# Patient Record
Sex: Female | Born: 1935 | Race: White | Hispanic: No | Smoking: Former smoker
Health system: Southern US, Community
[De-identification: ages and names within clinical notes are randomized; demographics above are authoritative.]

## PROBLEM LIST (undated history)

## (undated) DIAGNOSIS — E119 Type 2 diabetes mellitus without complications: Secondary | ICD-10-CM

## (undated) DIAGNOSIS — R112 Nausea with vomiting, unspecified: Secondary | ICD-10-CM

## (undated) DIAGNOSIS — F419 Anxiety disorder, unspecified: Secondary | ICD-10-CM

## (undated) DIAGNOSIS — I10 Essential (primary) hypertension: Secondary | ICD-10-CM

## (undated) DIAGNOSIS — E785 Hyperlipidemia, unspecified: Secondary | ICD-10-CM

## (undated) DIAGNOSIS — G4730 Sleep apnea, unspecified: Secondary | ICD-10-CM

## (undated) DIAGNOSIS — K219 Gastro-esophageal reflux disease without esophagitis: Secondary | ICD-10-CM

## (undated) DIAGNOSIS — E0590 Thyrotoxicosis, unspecified without thyrotoxic crisis or storm: Secondary | ICD-10-CM

## (undated) DIAGNOSIS — M1990 Unspecified osteoarthritis, unspecified site: Secondary | ICD-10-CM

## (undated) DIAGNOSIS — S43006A Unspecified dislocation of unspecified shoulder joint, initial encounter: Secondary | ICD-10-CM

## (undated) DIAGNOSIS — Z9889 Other specified postprocedural states: Secondary | ICD-10-CM

## (undated) HISTORY — PX: EYE SURGERY: SHX253

## (undated) HISTORY — PX: BREAST SURGERY: SHX581

## (undated) HISTORY — PX: TONSILLECTOMY: SUR1361

## (undated) HISTORY — PX: LIPOMA EXCISION: SHX5283

---

## 1997-09-30 ENCOUNTER — Other Ambulatory Visit: Admission: RE | Admit: 1997-09-30 | Discharge: 1997-09-30 | Payer: Self-pay | Admitting: Internal Medicine

## 1998-10-05 ENCOUNTER — Other Ambulatory Visit: Admission: RE | Admit: 1998-10-05 | Discharge: 1998-10-05 | Payer: Self-pay | Admitting: Internal Medicine

## 1999-05-17 ENCOUNTER — Encounter: Payer: Self-pay | Admitting: Orthopedic Surgery

## 1999-05-17 ENCOUNTER — Encounter: Admission: RE | Admit: 1999-05-17 | Discharge: 1999-05-17 | Payer: Self-pay | Admitting: Orthopedic Surgery

## 1999-10-29 ENCOUNTER — Ambulatory Visit (HOSPITAL_COMMUNITY): Admission: RE | Admit: 1999-10-29 | Discharge: 1999-10-29 | Payer: Self-pay | Admitting: Gastroenterology

## 1999-10-29 ENCOUNTER — Encounter (INDEPENDENT_AMBULATORY_CARE_PROVIDER_SITE_OTHER): Payer: Self-pay | Admitting: Specialist

## 1999-11-08 ENCOUNTER — Other Ambulatory Visit: Admission: RE | Admit: 1999-11-08 | Discharge: 1999-11-08 | Payer: Self-pay | Admitting: Internal Medicine

## 2000-04-17 ENCOUNTER — Encounter: Payer: Self-pay | Admitting: Orthopedic Surgery

## 2000-04-17 ENCOUNTER — Encounter: Admission: RE | Admit: 2000-04-17 | Discharge: 2000-04-17 | Payer: Self-pay | Admitting: Orthopedic Surgery

## 2000-11-13 ENCOUNTER — Other Ambulatory Visit: Admission: RE | Admit: 2000-11-13 | Discharge: 2000-11-13 | Payer: Self-pay | Admitting: Internal Medicine

## 2001-11-15 ENCOUNTER — Other Ambulatory Visit: Admission: RE | Admit: 2001-11-15 | Discharge: 2001-11-15 | Payer: Self-pay | Admitting: Internal Medicine

## 2003-08-28 ENCOUNTER — Encounter: Admission: RE | Admit: 2003-08-28 | Discharge: 2003-08-28 | Payer: Self-pay | Admitting: Orthopedic Surgery

## 2003-10-13 ENCOUNTER — Ambulatory Visit (HOSPITAL_COMMUNITY): Admission: RE | Admit: 2003-10-13 | Discharge: 2003-10-13 | Payer: Self-pay | Admitting: Gastroenterology

## 2003-10-13 ENCOUNTER — Encounter (INDEPENDENT_AMBULATORY_CARE_PROVIDER_SITE_OTHER): Payer: Self-pay | Admitting: Specialist

## 2006-09-24 ENCOUNTER — Emergency Department (HOSPITAL_COMMUNITY): Admission: EM | Admit: 2006-09-24 | Discharge: 2006-09-25 | Payer: Self-pay | Admitting: Emergency Medicine

## 2007-05-24 ENCOUNTER — Encounter: Admission: RE | Admit: 2007-05-24 | Discharge: 2007-05-24 | Payer: Self-pay | Admitting: Internal Medicine

## 2010-09-17 NOTE — Op Note (Signed)
NAME:  Cristina Middleton, Cristina Middleton                          ACCOUNT NO.:  [id number]   MEDICAL RECORD NO.:  [id number]                   PATIENT TYPE:  AMB   LOCATION:  ENDO                                 FACILITY:  [facility]   PHYSICIAN:  Petra Kuba, M.D.                 DATE OF BIRTH:  Sep 19, 1920   DATE OF PROCEDURE:  10/13/2003  DATE OF DISCHARGE:                                 OPERATIVE REPORT   PROCEDURE PERFORMED:  Colonoscopy.   ENDOSCOPIST:  Petra Kuba, M.D.   INDICATIONS FOR PROCEDURE:  Patient with history of colon polyps.  Due for  repeat screening.  Consent was signed after the risks, benefits, methods and  options were thoroughly discussed in the office on multiple occasions.   MEDICINES USED:  Demerol 40 mg, Versed 5 mg.   DESCRIPTION OF PROCEDURE:  Rectal inspection was pertinent for external  hemorrhoids, small.  Digital exam was negative.  A video pediatric  adjustable colonoscope was inserted and despite lots of diverticula, easily  able to be advanced around the colon to the cecum.  Other than left greater  than right diverticula, no abnormalities were seen.  The cecum was  identified by the appendiceal orifice and the ileocecal valve.  The scope  was slowly withdrawn. Moderate amount of washing and suctioning was done for  adequate visualization.  On slow withdrawal through the colon, three tiny  polyps were seen which were all cold biopsied times one or two on the  ileocecal valve, midtransverse and in the proximal rectum.  All were put in  the same container.  Possibly all were hyperplastic appearing.  The scope  was then slowly withdrawn.  Other than the diverticula, left greater than  right, no additional findings were seen.  Anorectal pullthrough and  retroflexion confirmed some small hemorrhoids.  The scope was reinserted a  short ways up the left side of the colon.  Air was suctioned, scope removed.  The patient tolerated the procedure well.  There was no  immediate obvious  complication.   ENDOSCOPIC DIAGNOSIS:  1. Internal and external hemorrhoids.  2. Left greater than right diverticula.  3. Three tiny polyps in the proximal rectum, mid transverse and ileocecal     valve, all cold biopsied.  4. Otherwise within normal limits to the cecum.   PLAN:  Await pathology but probably repeat colon screening if doing  exceptionally well medically in five years.  Happy to see back p.r.n.  Otherwise return care to Dr.  Lendell Caprice for the customary health care  maintenance.                                               Petra Kuba, M.D.    MEM/MEDQ  D:  10/13/2003  T:  10/13/2003  Job:  [id number]   cc:   Janae Bridgeman. Eloise Harman., M.D.  [address]  [place]  [place] [address]  Fax: [phone]

## 2010-09-17 NOTE — Op Note (Signed)
[facility]  Patient:    Cristina Middleton, Cristina Middleton                       MRN: [id number] Adm. Date:  [id number] Attending:  Nelda Marseille CC:         Petra Kuba, M.D.             Janae Bridgeman. Eloise Harman., M.D.                           Operative Report  PROCEDURE:  Colonoscopy with polypectomy.  INDICATION:  Patient with a history of colon polyps, due for repeat screening.  Consent was signed after risks, benefits, methods and options were thoroughly discussed multiple times in the past.  Medicines used were Demerol 40, Versed 4.  PROCEDURE:  Rectal inspection is pertinent for external hemorrhoids.  Digital exam was negative.  The video colonoscope was inserted and with mild difficulty due to diverticula-filled sigmoid, we were able to advance to the colon.  This did require rolling her on her back and some abdominal pressure. The cecum was identified by the appendiceal orifice and the ileocecal valve. On insertion noted on the left side a diverticula.  No other abnormalities were seen.  The scope was slowly withdrawn.  The prep was fairly adequate. Due to the significant diverticula there was lots of stool that needed washing and suctioning and some stool that was formed that needed to be washed in the different positions.  There was an occasional right-sided diverticula.  In the proximal ascending a 2-mm polyp was seen and was cold biopsied times one.  The scope was further withdrawn and no polyps were seen as we slowly withdrew back to the distal sigmoid where two small polyps were seen and were each hot biopsied times two.  Once back in the rectum the scope was retroflexed, pertinent for some internal hemorrhoids and a small polyp on retroflexion which was hot biopsied times one.  All polyps were put in the same container.  The scope was straightened and straight visualization in the rectum showed a well-healed scar from a previous moderately-large  rectal polyp and we went ahead and took some cold biopsies of that just to be sure there were no signs of residual polyp.  That was put in a second container.  The scope was inserted a short ways up the sigmoid area, suctioned and the scope removed.  The patient tolerated the procedure well.  There were no obvious immediate complications.  ENDOSCOPIC DIAGNOSES: 1.  Internal / external hemorrhoids. 2.  Three rectal sigmoid tiny to small polyps status post hot biopsy. 3.  Severe left greater than occasional right diverticula. 4.  Ascending tiny polyp cold biopsy. 5.  Rectal scar from previous cautery status post biopsy put in a second     container. 6.  Otherwise within normal limits to the cecum with a fairly adequate prep     requiring lots of washing and suctioning.  PLAN:  Await pathology to determine future colonic screening _____ polypectomy instructions, yearly rectals and guaiacs per Dr. Lendell Caprice.  Have to see her back sooner p.r.n.  Otherwise probable repeat colonoscopy in five years if medically stable. DD:  10/29/99 TD:  10/30/99 Job: [id number]

## 2011-06-06 DIAGNOSIS — J019 Acute sinusitis, unspecified: Secondary | ICD-10-CM | POA: Diagnosis not present

## 2011-06-13 DIAGNOSIS — R112 Nausea with vomiting, unspecified: Secondary | ICD-10-CM | POA: Diagnosis not present

## 2011-06-27 DIAGNOSIS — M25859 Other specified joint disorders, unspecified hip: Secondary | ICD-10-CM | POA: Diagnosis not present

## 2011-06-27 DIAGNOSIS — N949 Unspecified condition associated with female genital organs and menstrual cycle: Secondary | ICD-10-CM | POA: Diagnosis not present

## 2011-06-27 DIAGNOSIS — M47817 Spondylosis without myelopathy or radiculopathy, lumbosacral region: Secondary | ICD-10-CM | POA: Diagnosis not present

## 2011-06-27 DIAGNOSIS — M719 Bursopathy, unspecified: Secondary | ICD-10-CM | POA: Diagnosis not present

## 2011-06-27 DIAGNOSIS — M25559 Pain in unspecified hip: Secondary | ICD-10-CM | POA: Diagnosis not present

## 2011-06-27 DIAGNOSIS — M159 Polyosteoarthritis, unspecified: Secondary | ICD-10-CM | POA: Diagnosis not present

## 2011-06-27 DIAGNOSIS — M25569 Pain in unspecified knee: Secondary | ICD-10-CM | POA: Diagnosis not present

## 2011-06-27 DIAGNOSIS — M4120 Other idiopathic scoliosis, site unspecified: Secondary | ICD-10-CM | POA: Diagnosis not present

## 2011-06-27 DIAGNOSIS — M169 Osteoarthritis of hip, unspecified: Secondary | ICD-10-CM | POA: Diagnosis not present

## 2011-06-30 DIAGNOSIS — M79609 Pain in unspecified limb: Secondary | ICD-10-CM | POA: Diagnosis not present

## 2011-07-05 DIAGNOSIS — M79609 Pain in unspecified limb: Secondary | ICD-10-CM | POA: Diagnosis not present

## 2011-07-07 DIAGNOSIS — M79609 Pain in unspecified limb: Secondary | ICD-10-CM | POA: Diagnosis not present

## 2011-07-12 DIAGNOSIS — M79609 Pain in unspecified limb: Secondary | ICD-10-CM | POA: Diagnosis not present

## 2011-07-14 DIAGNOSIS — M79609 Pain in unspecified limb: Secondary | ICD-10-CM | POA: Diagnosis not present

## 2011-07-14 DIAGNOSIS — E119 Type 2 diabetes mellitus without complications: Secondary | ICD-10-CM | POA: Diagnosis not present

## 2011-07-14 DIAGNOSIS — E7800 Pure hypercholesterolemia, unspecified: Secondary | ICD-10-CM | POA: Diagnosis not present

## 2011-07-19 DIAGNOSIS — M79609 Pain in unspecified limb: Secondary | ICD-10-CM | POA: Diagnosis not present

## 2011-07-21 DIAGNOSIS — M79609 Pain in unspecified limb: Secondary | ICD-10-CM | POA: Diagnosis not present

## 2011-07-25 DIAGNOSIS — E119 Type 2 diabetes mellitus without complications: Secondary | ICD-10-CM | POA: Diagnosis not present

## 2011-07-25 DIAGNOSIS — I10 Essential (primary) hypertension: Secondary | ICD-10-CM | POA: Diagnosis not present

## 2011-07-25 DIAGNOSIS — E7800 Pure hypercholesterolemia, unspecified: Secondary | ICD-10-CM | POA: Diagnosis not present

## 2011-07-26 DIAGNOSIS — M79609 Pain in unspecified limb: Secondary | ICD-10-CM | POA: Diagnosis not present

## 2011-07-28 DIAGNOSIS — M79609 Pain in unspecified limb: Secondary | ICD-10-CM | POA: Diagnosis not present

## 2011-08-10 DIAGNOSIS — Z1231 Encounter for screening mammogram for malignant neoplasm of breast: Secondary | ICD-10-CM | POA: Diagnosis not present

## 2011-08-17 DIAGNOSIS — E119 Type 2 diabetes mellitus without complications: Secondary | ICD-10-CM | POA: Diagnosis not present

## 2011-08-17 DIAGNOSIS — E7800 Pure hypercholesterolemia, unspecified: Secondary | ICD-10-CM | POA: Diagnosis not present

## 2011-08-17 DIAGNOSIS — E039 Hypothyroidism, unspecified: Secondary | ICD-10-CM | POA: Diagnosis not present

## 2011-08-17 DIAGNOSIS — I10 Essential (primary) hypertension: Secondary | ICD-10-CM | POA: Diagnosis not present

## 2011-08-23 DIAGNOSIS — M25569 Pain in unspecified knee: Secondary | ICD-10-CM | POA: Diagnosis not present

## 2011-08-23 DIAGNOSIS — M25559 Pain in unspecified hip: Secondary | ICD-10-CM | POA: Diagnosis not present

## 2011-08-23 DIAGNOSIS — M719 Bursopathy, unspecified: Secondary | ICD-10-CM | POA: Diagnosis not present

## 2011-08-23 DIAGNOSIS — M159 Polyosteoarthritis, unspecified: Secondary | ICD-10-CM | POA: Diagnosis not present

## 2011-09-27 DIAGNOSIS — H409 Unspecified glaucoma: Secondary | ICD-10-CM | POA: Diagnosis not present

## 2011-09-27 DIAGNOSIS — H4011X0 Primary open-angle glaucoma, stage unspecified: Secondary | ICD-10-CM | POA: Diagnosis not present

## 2011-09-27 DIAGNOSIS — H3530 Unspecified macular degeneration: Secondary | ICD-10-CM | POA: Diagnosis not present

## 2011-09-27 DIAGNOSIS — Z961 Presence of intraocular lens: Secondary | ICD-10-CM | POA: Diagnosis not present

## 2011-10-03 DIAGNOSIS — M67919 Unspecified disorder of synovium and tendon, unspecified shoulder: Secondary | ICD-10-CM | POA: Diagnosis not present

## 2011-10-03 DIAGNOSIS — G5600 Carpal tunnel syndrome, unspecified upper limb: Secondary | ICD-10-CM | POA: Diagnosis not present

## 2011-10-03 DIAGNOSIS — M19019 Primary osteoarthritis, unspecified shoulder: Secondary | ICD-10-CM | POA: Diagnosis not present

## 2011-10-03 DIAGNOSIS — M25519 Pain in unspecified shoulder: Secondary | ICD-10-CM | POA: Diagnosis not present

## 2011-10-03 DIAGNOSIS — M159 Polyosteoarthritis, unspecified: Secondary | ICD-10-CM | POA: Diagnosis not present

## 2011-10-03 DIAGNOSIS — IMO0002 Reserved for concepts with insufficient information to code with codable children: Secondary | ICD-10-CM | POA: Diagnosis not present

## 2011-10-24 DIAGNOSIS — E119 Type 2 diabetes mellitus without complications: Secondary | ICD-10-CM | POA: Diagnosis not present

## 2011-10-24 DIAGNOSIS — E7800 Pure hypercholesterolemia, unspecified: Secondary | ICD-10-CM | POA: Diagnosis not present

## 2011-10-27 DIAGNOSIS — R630 Anorexia: Secondary | ICD-10-CM | POA: Diagnosis not present

## 2011-10-27 DIAGNOSIS — I10 Essential (primary) hypertension: Secondary | ICD-10-CM | POA: Diagnosis not present

## 2011-10-27 DIAGNOSIS — E039 Hypothyroidism, unspecified: Secondary | ICD-10-CM | POA: Diagnosis not present

## 2011-10-27 DIAGNOSIS — E7800 Pure hypercholesterolemia, unspecified: Secondary | ICD-10-CM | POA: Diagnosis not present

## 2011-10-27 DIAGNOSIS — E119 Type 2 diabetes mellitus without complications: Secondary | ICD-10-CM | POA: Diagnosis not present

## 2011-11-07 DIAGNOSIS — R634 Abnormal weight loss: Secondary | ICD-10-CM | POA: Diagnosis not present

## 2011-11-07 DIAGNOSIS — E119 Type 2 diabetes mellitus without complications: Secondary | ICD-10-CM | POA: Diagnosis not present

## 2011-11-07 DIAGNOSIS — K449 Diaphragmatic hernia without obstruction or gangrene: Secondary | ICD-10-CM | POA: Diagnosis not present

## 2011-11-07 DIAGNOSIS — J9819 Other pulmonary collapse: Secondary | ICD-10-CM | POA: Diagnosis not present

## 2011-11-07 DIAGNOSIS — L989 Disorder of the skin and subcutaneous tissue, unspecified: Secondary | ICD-10-CM | POA: Diagnosis not present

## 2011-11-28 ENCOUNTER — Other Ambulatory Visit: Payer: Self-pay | Admitting: Surgery

## 2011-11-28 DIAGNOSIS — D485 Neoplasm of uncertain behavior of skin: Secondary | ICD-10-CM | POA: Diagnosis not present

## 2011-11-28 DIAGNOSIS — L820 Inflamed seborrheic keratosis: Secondary | ICD-10-CM | POA: Diagnosis not present

## 2011-12-02 ENCOUNTER — Ambulatory Visit
Admission: RE | Admit: 2011-12-02 | Discharge: 2011-12-02 | Disposition: A | Discharge status: Home / Self Care | Payer: Medicare Other | Source: Ambulatory Visit | Attending: Internal Medicine | Admitting: Internal Medicine

## 2011-12-02 ENCOUNTER — Other Ambulatory Visit: Payer: Self-pay | Admitting: Internal Medicine

## 2011-12-02 DIAGNOSIS — R609 Edema, unspecified: Secondary | ICD-10-CM

## 2011-12-02 DIAGNOSIS — R52 Pain, unspecified: Secondary | ICD-10-CM

## 2011-12-02 DIAGNOSIS — M79609 Pain in unspecified limb: Secondary | ICD-10-CM | POA: Diagnosis not present

## 2011-12-02 DIAGNOSIS — M7989 Other specified soft tissue disorders: Secondary | ICD-10-CM | POA: Diagnosis not present

## 2011-12-05 DIAGNOSIS — M719 Bursopathy, unspecified: Secondary | ICD-10-CM | POA: Diagnosis not present

## 2011-12-05 DIAGNOSIS — IMO0002 Reserved for concepts with insufficient information to code with codable children: Secondary | ICD-10-CM | POA: Diagnosis not present

## 2011-12-05 DIAGNOSIS — M67919 Unspecified disorder of synovium and tendon, unspecified shoulder: Secondary | ICD-10-CM | POA: Diagnosis not present

## 2011-12-05 DIAGNOSIS — G5600 Carpal tunnel syndrome, unspecified upper limb: Secondary | ICD-10-CM | POA: Diagnosis not present

## 2011-12-05 DIAGNOSIS — M159 Polyosteoarthritis, unspecified: Secondary | ICD-10-CM | POA: Diagnosis not present

## 2011-12-05 DIAGNOSIS — M25569 Pain in unspecified knee: Secondary | ICD-10-CM | POA: Diagnosis not present

## 2012-01-11 DIAGNOSIS — M159 Polyosteoarthritis, unspecified: Secondary | ICD-10-CM | POA: Diagnosis not present

## 2012-01-11 DIAGNOSIS — IMO0002 Reserved for concepts with insufficient information to code with codable children: Secondary | ICD-10-CM | POA: Diagnosis not present

## 2012-01-11 DIAGNOSIS — M67919 Unspecified disorder of synovium and tendon, unspecified shoulder: Secondary | ICD-10-CM | POA: Diagnosis not present

## 2012-01-11 DIAGNOSIS — M25569 Pain in unspecified knee: Secondary | ICD-10-CM | POA: Diagnosis not present

## 2012-01-17 DIAGNOSIS — M1710 Unilateral primary osteoarthritis, unspecified knee: Secondary | ICD-10-CM | POA: Diagnosis not present

## 2012-01-26 DIAGNOSIS — E039 Hypothyroidism, unspecified: Secondary | ICD-10-CM | POA: Diagnosis not present

## 2012-01-26 DIAGNOSIS — I10 Essential (primary) hypertension: Secondary | ICD-10-CM | POA: Diagnosis not present

## 2012-01-26 DIAGNOSIS — E7800 Pure hypercholesterolemia, unspecified: Secondary | ICD-10-CM | POA: Diagnosis not present

## 2012-01-26 DIAGNOSIS — E559 Vitamin D deficiency, unspecified: Secondary | ICD-10-CM | POA: Diagnosis not present

## 2012-01-26 DIAGNOSIS — E119 Type 2 diabetes mellitus without complications: Secondary | ICD-10-CM | POA: Diagnosis not present

## 2012-01-27 DIAGNOSIS — Z23 Encounter for immunization: Secondary | ICD-10-CM | POA: Diagnosis not present

## 2012-01-30 DIAGNOSIS — E7800 Pure hypercholesterolemia, unspecified: Secondary | ICD-10-CM | POA: Diagnosis not present

## 2012-01-30 DIAGNOSIS — E119 Type 2 diabetes mellitus without complications: Secondary | ICD-10-CM | POA: Diagnosis not present

## 2012-01-30 DIAGNOSIS — D649 Anemia, unspecified: Secondary | ICD-10-CM | POA: Diagnosis not present

## 2012-01-30 DIAGNOSIS — I10 Essential (primary) hypertension: Secondary | ICD-10-CM | POA: Diagnosis not present

## 2012-02-02 DIAGNOSIS — Z1212 Encounter for screening for malignant neoplasm of rectum: Secondary | ICD-10-CM | POA: Diagnosis not present

## 2012-02-09 DIAGNOSIS — D649 Anemia, unspecified: Secondary | ICD-10-CM | POA: Diagnosis not present

## 2012-02-13 ENCOUNTER — Other Ambulatory Visit: Payer: Self-pay | Admitting: Gastroenterology

## 2012-02-13 DIAGNOSIS — D649 Anemia, unspecified: Secondary | ICD-10-CM

## 2012-02-13 DIAGNOSIS — D509 Iron deficiency anemia, unspecified: Secondary | ICD-10-CM | POA: Diagnosis not present

## 2012-02-13 DIAGNOSIS — K5901 Slow transit constipation: Secondary | ICD-10-CM | POA: Diagnosis not present

## 2012-02-14 ENCOUNTER — Other Ambulatory Visit: Payer: Medicare Other

## 2012-02-15 ENCOUNTER — Ambulatory Visit
Admission: RE | Admit: 2012-02-15 | Discharge: 2012-02-15 | Disposition: A | Discharge status: Home / Self Care | Payer: Medicare Other | Source: Ambulatory Visit | Attending: Gastroenterology | Admitting: Gastroenterology

## 2012-02-15 DIAGNOSIS — D649 Anemia, unspecified: Secondary | ICD-10-CM

## 2012-02-15 DIAGNOSIS — K449 Diaphragmatic hernia without obstruction or gangrene: Secondary | ICD-10-CM | POA: Diagnosis not present

## 2012-02-15 MED ORDER — IOHEXOL 300 MG/ML  SOLN
100.0000 mL | Freq: Once | INTRAMUSCULAR | Status: AC | PRN
  Administered 2012-02-15: 100 mL via INTRAVENOUS

## 2012-02-28 DIAGNOSIS — M1710 Unilateral primary osteoarthritis, unspecified knee: Secondary | ICD-10-CM | POA: Diagnosis not present

## 2012-03-22 DIAGNOSIS — E538 Deficiency of other specified B group vitamins: Secondary | ICD-10-CM | POA: Diagnosis not present

## 2012-03-22 DIAGNOSIS — D649 Anemia, unspecified: Secondary | ICD-10-CM | POA: Diagnosis not present

## 2012-03-27 DIAGNOSIS — H4011X0 Primary open-angle glaucoma, stage unspecified: Secondary | ICD-10-CM | POA: Diagnosis not present

## 2012-03-27 DIAGNOSIS — H3530 Unspecified macular degeneration: Secondary | ICD-10-CM | POA: Diagnosis not present

## 2012-03-27 DIAGNOSIS — E119 Type 2 diabetes mellitus without complications: Secondary | ICD-10-CM | POA: Diagnosis not present

## 2012-03-27 DIAGNOSIS — Z961 Presence of intraocular lens: Secondary | ICD-10-CM | POA: Diagnosis not present

## 2012-03-28 DIAGNOSIS — M25569 Pain in unspecified knee: Secondary | ICD-10-CM | POA: Diagnosis not present

## 2012-03-28 DIAGNOSIS — I10 Essential (primary) hypertension: Secondary | ICD-10-CM | POA: Diagnosis not present

## 2012-04-05 ENCOUNTER — Emergency Department (HOSPITAL_COMMUNITY)
Admission: EM | Admit: 2012-04-05 | Discharge: 2012-04-05 | Disposition: A | Discharge status: Home / Self Care | Payer: Medicare Other | Attending: Emergency Medicine | Admitting: Emergency Medicine

## 2012-04-05 ENCOUNTER — Encounter (HOSPITAL_COMMUNITY): Payer: Self-pay

## 2012-04-05 DIAGNOSIS — S61512A Laceration without foreign body of left wrist, initial encounter: Secondary | ICD-10-CM

## 2012-04-05 DIAGNOSIS — S61509A Unspecified open wound of unspecified wrist, initial encounter: Secondary | ICD-10-CM | POA: Diagnosis not present

## 2012-04-05 DIAGNOSIS — Y93E9 Activity, other interior property and clothing maintenance: Secondary | ICD-10-CM | POA: Insufficient documentation

## 2012-04-05 DIAGNOSIS — R112 Nausea with vomiting, unspecified: Secondary | ICD-10-CM | POA: Diagnosis not present

## 2012-04-05 DIAGNOSIS — W010XXA Fall on same level from slipping, tripping and stumbling without subsequent striking against object, initial encounter: Secondary | ICD-10-CM | POA: Insufficient documentation

## 2012-04-05 DIAGNOSIS — Y9289 Other specified places as the place of occurrence of the external cause: Secondary | ICD-10-CM | POA: Insufficient documentation

## 2012-04-05 LAB — GLUCOSE, CAPILLARY: Glucose-Capillary: 111 mg/dL — ABNORMAL HIGH (ref 70–99)

## 2012-04-05 MED ORDER — LIDOCAINE-EPINEPHRINE-TETRACAINE (LET) SOLUTION
3.0000 mL | Freq: Once | NASAL | Status: AC
  Administered 2012-04-05: 3 mL via TOPICAL
  Filled 2012-04-05: qty 3

## 2012-04-05 MED ORDER — ONDANSETRON 4 MG PO TBDP: 4.0000 mg | ORAL_TABLET | Freq: Once | ORAL | Status: DC

## 2012-04-05 NOTE — ED Notes (Signed)
Pt reports she tripped and fell, hurt her left wrist. Pt has skin tear approx 1 inch in length.

## 2012-04-05 NOTE — ED Notes (Signed)
Pt sts tripped today and has skin tear to left wrist; pt c/o left wrist pain also; bleeding controlled

## 2012-04-05 NOTE — ED Notes (Signed)
Pt c/o having a hard time breathing, reports she feels very nervous. Pt in nad, airway intact. Dr. [name] made aware. Pt was placed on monitor, Vital signs stable. CBG checked = 111. RN stayed with pt, talked to her told her to take slow deep breaths. Pt began to feel much better and relaxed. Pt later reported she has a stress test next week that she is nervous about.

## 2012-04-05 NOTE — ED Provider Notes (Addendum)
History   This chart was scribed for Cristina Middleton. Cristina Lamas, MD by Cristina Middleton, ED Scribe. The patient was seen in room TR05C/TR05C and the patient's care was started at 2:05PM.     CSN: [id number]  Arrival date & time 04/05/12  1352   None     Chief Complaint  Patient presents with  . Extremity Laceration    (Consider location/radiation/quality/duration/timing/severity/associated sxs/prior treatment) The history is provided by the patient. No language interpreter was used.    Cristina Middleton is a 78 y.o. female who presents to the Emergency Department complaining of sudden, moderate, extremity laceration, 4 cm in size, located at the left wrist, onset today (04/05/12). The pt reports she was outside, taking out the garbage, where she suddenly slipped, fell and impacted upon her left wrist. The pt was able to get up without assistance and ambulate after the incident. She did not lose consciousness or experience any other injuries during the fall.  The pt denies hitting her head and LOC associated with the fall. The pt is UTD on her tetanus immunization.   The pt does not smoke or drink alcohol.      History reviewed. No pertinent past medical history.  History reviewed. No pertinent past surgical history.  History reviewed. No pertinent family history.  History  Substance Use Topics  . Smoking status: Never Smoker   . Smokeless tobacco: Not on file  . Alcohol Use: No    OB History    Grav Para Term Preterm Abortions TAB SAB Ect Mult Living                  Review of Systems  Skin: Positive for wound. Negative for rash.  Neurological: Negative for syncope and weakness.    Allergies  Penicillins and Sulfa antibiotics  Home Medications   Current Outpatient Rx  Name  Route  Sig  Dispense  Refill  . ASPIRIN EC 81 MG PO TBEC   Oral   Take 81 mg by mouth daily.         Marland Kitchen CALCIUM CARBONATE 600 MG PO TABS   Oral   Take 600 mg by mouth daily.         .  CHOLECALCIFEROL 400 UNITS PO TABS   Oral   Take 400 Units by mouth daily.         Marland Kitchen CINNAMON PO   Oral   Take 1,000 mg by mouth daily.         Marland Kitchen ESCITALOPRAM OXALATE 20 MG PO TABS   Oral   Take 20 mg by mouth daily.         . INDAPAMIDE 2.5 MG PO TABS   Oral   Take 2.5 mg by mouth See admin instructions. 3 days a week (Monday, Wednesday, Friday)         . LEVOTHYROXINE SODIUM 88 MCG PO TABS   Oral   Take 88 mcg by mouth daily.         . MELOXICAM 15 MG PO TABS   Oral   Take 15 mg by mouth daily.         . OCUVITE-LUTEIN PO CAPS   Oral   Take 1 capsule by mouth daily.         Marland Kitchen RABEPRAZOLE SODIUM 20 MG PO TBEC   Oral   Take 20 mg by mouth daily.         Marland Kitchen SIMVASTATIN 20 MG PO TABS   Oral  Take 20 mg by mouth every evening.         Marland Kitchen VITAMIN B-12 500 MCG PO TABS   Oral   Take 500 mcg by mouth daily.           BP 136/58  Pulse 82  Temp 98.2 F (36.8 C) (Oral)  Resp 18  SpO2 97%  Physical Exam  Nursing note and vitals reviewed. Constitutional: She appears well-developed and well-nourished. No distress.  HENT:  Head: Atraumatic.  Nose: Nose normal.  Neck: Normal range of motion.  Pulmonary/Chest: Effort normal.  Musculoskeletal: Normal range of motion. She exhibits no tenderness.       Left wrist: She exhibits laceration. She exhibits no tenderness.       4 cm laceration located at the left wrist. Good ROM. Wrist non tender, Snuff box non tender.   Neurological: She is alert.  Skin: Skin is warm and dry. She is not diaphoretic.    ED Course  Wound repair Date/Time: 04/05/2012 2:21 PM Performed by: Cristina Middleton Authorized by: Cristina Middleton Consent: Verbal consent obtained. Risks and benefits: risks, benefits and alternatives were discussed Consent given by: patient Patient understanding: patient states understanding of the procedure being performed Patient consent: the patient's understanding of the procedure matches  consent given Patient identity confirmed: verbally with patient Time out: Immediately prior to procedure a "time out" was called to verify the correct patient, procedure, equipment, support staff and site/side marked as required. Local anesthesia used: yes Local anesthetic: LET (lido,epi,tetracaine) Anesthetic total: 2 ml Patient sedated: no Patient tolerance: Patient tolerated the procedure well with no immediate complications. Comments: Steri strips applied to superficial laceration and skin tear to left wrist.  Pt tolerated well.  Measured 4 cm in length.  Simple laceration.   (including critical care time)  DIAGNOSTIC STUDIES: Oxygen Saturation is 97% on room air, normal by my interpretation.    COORDINATION OF CARE:   2:06 PM- Treatment plan discussed with patient. Pt agrees with treatment.     Labs Reviewed - No data to display No results found.   1. Laceration of left wrist     2:32 PM Just prior to discharge, pt got visibly emotional, seemed quite anxious, complained of inability to breathe.  She complained of feeling dizzy, couldn't breathe.  Pt had 1 episode of N/V.  Will continue to monitor here and give some zofran here.    After emesis, pt seemed to be improved.  2:50 PM Pt continued to improve on her own, no meds, felt better, wanted to go home with family.      MDM  I personally performed the services described in this documentation, which was scribed in my presence. The recorded information has been reviewed and is accurate.   Pt with skin tear, superficial laceration to dorsum of left wrist.  FROM of wrist, no sig tenderness.  Cleaned, she reports tetanus is UTD.  Steri strips applied by RN under my direct supervision.     Cristina Middleton. Cristina Lamas, MD 04/05/12 1422  Cristina Middleton. Cristina Lamas, MD 04/05/12 1422  Cristina Middleton. Cristina Shimamoto, MD 04/05/12 1450

## 2012-04-13 DIAGNOSIS — Z01810 Encounter for preprocedural cardiovascular examination: Secondary | ICD-10-CM | POA: Diagnosis not present

## 2012-05-07 DIAGNOSIS — I10 Essential (primary) hypertension: Secondary | ICD-10-CM | POA: Diagnosis not present

## 2012-05-07 DIAGNOSIS — M949 Disorder of cartilage, unspecified: Secondary | ICD-10-CM | POA: Diagnosis not present

## 2012-05-07 DIAGNOSIS — M899 Disorder of bone, unspecified: Secondary | ICD-10-CM | POA: Diagnosis not present

## 2012-05-07 DIAGNOSIS — D649 Anemia, unspecified: Secondary | ICD-10-CM | POA: Diagnosis not present

## 2012-05-07 DIAGNOSIS — R7309 Other abnormal glucose: Secondary | ICD-10-CM | POA: Diagnosis not present

## 2012-05-10 DIAGNOSIS — M25569 Pain in unspecified knee: Secondary | ICD-10-CM | POA: Diagnosis not present

## 2012-05-10 DIAGNOSIS — I10 Essential (primary) hypertension: Secondary | ICD-10-CM | POA: Diagnosis not present

## 2012-05-10 DIAGNOSIS — E119 Type 2 diabetes mellitus without complications: Secondary | ICD-10-CM | POA: Diagnosis not present

## 2012-05-10 DIAGNOSIS — E7800 Pure hypercholesterolemia, unspecified: Secondary | ICD-10-CM | POA: Diagnosis not present

## 2012-05-11 ENCOUNTER — Encounter (HOSPITAL_COMMUNITY): Payer: Self-pay | Admitting: Pharmacy Technician

## 2012-05-15 DIAGNOSIS — M1710 Unilateral primary osteoarthritis, unspecified knee: Secondary | ICD-10-CM | POA: Diagnosis not present

## 2012-05-17 NOTE — Pre-Procedure Instructions (Signed)
Roselinda Bahena ZOXWRU  05/17/2012   Your procedure is scheduled on:  Friday May 25, 2012.  Report to [facility] at 0800 AM.  Call this number if you have problems the morning of surgery: [phone]   Remember:   Do not eat food or drink liquids after midnight.   Take these medicines the morning of surgery with A SIP OF WATER: Levothyroxine (Synthroid), Rabeprazole (Aciphex)   Do not wear jewelry, make-up or nail polish.  Do not wear lotions, powders, or perfumes.   Do not shave 48 hours prior to surgery.   Do not bring valuables to the hospital.  Contacts, dentures or bridgework may not be worn into surgery.  Leave suitcase in the car. After surgery it may be brought to your room.  For patients admitted to the hospital, checkout time is 11:00 AM the day of discharge.   Patients discharged the day of surgery will not be allowed to drive home.  Name and phone number of your driver: SYLVIA  CLAPP  -- DTR  Special Instructions: Shower using CHG 2 nights before surgery and the night before surgery.  If you shower the day of surgery use CHG.  Use special wash - you have one bottle of CHG for all showers.  You should use approximately 1/3 of the bottle for each shower.   Please read over the following fact sheets that you were given: Pain Booklet, Coughing and Deep Breathing, Blood Transfusion Information, Total Joint Packet, MRSA Information and Surgical Site Infection Prevention

## 2012-05-18 ENCOUNTER — Encounter (HOSPITAL_COMMUNITY): Payer: Self-pay

## 2012-05-18 ENCOUNTER — Encounter (HOSPITAL_COMMUNITY)
Admission: RE | Admit: 2012-05-18 | Discharge: 2012-05-18 | Disposition: A | Discharge status: Home / Self Care | Payer: Medicare Other | Source: Ambulatory Visit | Attending: Orthopedic Surgery | Admitting: Orthopedic Surgery

## 2012-05-18 HISTORY — DX: Type 2 diabetes mellitus without complications: E11.9

## 2012-05-18 HISTORY — DX: Essential (primary) hypertension: I10

## 2012-05-18 HISTORY — DX: Sleep apnea, unspecified: G47.30

## 2012-05-18 HISTORY — DX: Other specified postprocedural states: Z98.890

## 2012-05-18 HISTORY — DX: Gastro-esophageal reflux disease without esophagitis: K21.9

## 2012-05-18 HISTORY — DX: Thyrotoxicosis, unspecified without thyrotoxic crisis or storm: E05.90

## 2012-05-18 HISTORY — DX: Other specified postprocedural states: R11.2

## 2012-05-18 HISTORY — DX: Anxiety disorder, unspecified: F41.9

## 2012-05-18 HISTORY — DX: Unspecified osteoarthritis, unspecified site: M19.90

## 2012-05-18 HISTORY — DX: Unspecified dislocation of unspecified shoulder joint, initial encounter: S43.006A

## 2012-05-18 LAB — PROTIME-INR
INR: 0.93 (ref 0.00–1.49)
Prothrombin Time: 12.4 seconds (ref 11.6–15.2)

## 2012-05-18 LAB — DIFFERENTIAL
Lymphocytes Relative: 21 % (ref 12–46)
Monocytes Absolute: 0.7 10*3/uL (ref 0.1–1.0)
Monocytes Relative: 13 % — ABNORMAL HIGH (ref 3–12)
Neutro Abs: 3.6 10*3/uL (ref 1.7–7.7)

## 2012-05-18 LAB — URINALYSIS, ROUTINE W REFLEX MICROSCOPIC
Bilirubin Urine: NEGATIVE
Hgb urine dipstick: NEGATIVE
Specific Gravity, Urine: 1.018 (ref 1.005–1.030)
Urobilinogen, UA: 0.2 mg/dL (ref 0.0–1.0)

## 2012-05-18 LAB — COMPREHENSIVE METABOLIC PANEL
AST: 15 U/L (ref 0–37)
BUN: 19 mg/dL (ref 6–23)
CO2: 27 mEq/L (ref 19–32)
Chloride: 100 mEq/L (ref 96–112)
Creatinine, Ser: 0.78 mg/dL (ref 0.50–1.10)
GFR calc non Af Amer: 72 mL/min — ABNORMAL LOW (ref >90)
Glucose, Bld: 104 mg/dL — ABNORMAL HIGH (ref 70–99)
Total Bilirubin: 0.2 mg/dL — ABNORMAL LOW (ref 0.3–1.2)

## 2012-05-18 LAB — SURGICAL PCR SCREEN: Staphylococcus aureus: NEGATIVE

## 2012-05-18 LAB — CBC
HCT: 36.9 % (ref 36.0–46.0)
Hemoglobin: 12.6 g/dL (ref 12.0–15.0)
WBC: 5.7 10*3/uL (ref 4.0–10.5)

## 2012-05-18 NOTE — Progress Notes (Addendum)
HAVE REQUESTED CARDIAC NOTES FROM DR. GANJI'S OFFICE AND HAVE REQUESTED SLEEP STUDY RESULTS FROM  DR. Pearson Grippe  -- [facility]   [phone]    DA  MAY HAVE TO DO CXR & EKG DOS....Marland KitchenMarland KitchenDA  NO ORDERS RECEIVED FROM SURGEON'S OFFICE.Marland KitchenDA  1300  Received ekg & cxr from [facility]....will have Revonda Standard review, but still waiting on info from Dr. Jeanelle Malling office.[phone].....da 1302  Received [facility] report from Dr. Jacinto Halim...the office will attempt to send me a ekg if they have one.....Marland KitchenDA

## 2012-05-21 NOTE — Consult Note (Signed)
Anesthesia Chart Review:  Patient is a 79 year old female scheduled for right TKA by Dr. Madelon Lips on 05/25/12.  History includes non-smoker, DM2, OSA, HTN, GERD, hypothyroidism on levothyroxine, post-operative N/V.  PCP is Dr. Pearson Grippe at [facility]).  He is aware of surgery, and by his 05/10/12 note, "pt is low-moderate risk for surgery but the benefit of surgery outweights the risk and therefore I think it would be prudent to proceed and pt is medically cleared for surgery."  Her EKG sent from [facility] was from 03/28/12, and showed sinus arrhythmia with anterolateral ST/T changes and inferior ST elevation.  It was thought to be a poor quality EKG, but she was referred for a stress test at [facility] which was done on 04/13/12.  By report, conclusions showed: 1. Resting EKG NSR, poor R wave progression. Nonspecific T inversion anterior leads, cannot exclude ischemia. Stress EKG was nondiagnostic for ischemia. Stress symptoms included NONE. Stress terminated due to completion of protocol.  2. Perfusion imaging study demonstrated mild soft tissue attenuation consistent with diaphragmatic attenuation. There was no evidence of ischemia or scar. The left ventricular systolic function was normal. This is a low risk study.  Since her EKG is > 11 year old, she will need one repeated on arrival.  Her CXR from [facility] was also > 55 year old, so will need to be repeated on arrival.  Pre-operative labs noted.  She will need a T&S on arrival.  She had a low risk stress test just over one year ago and has been medically cleared by her PCP for this procedure.  Unless there are significant changes on her day of surgery EKG or worrisome findings on CXR, then I would anticipate she could proceed as planned.  Shonna Chock, PA-C  05/21/12 1411

## 2012-05-22 DIAGNOSIS — Z23 Encounter for immunization: Secondary | ICD-10-CM | POA: Diagnosis not present

## 2012-05-24 ENCOUNTER — Other Ambulatory Visit: Payer: Self-pay | Admitting: Physician Assistant

## 2012-05-24 MED ORDER — CLINDAMYCIN PHOSPHATE 900 MG/50ML IV SOLN
900.0000 mg | INTRAVENOUS | Status: AC
  Administered 2012-05-25: 900 mg via INTRAVENOUS
  Filled 2012-05-24: qty 50

## 2012-05-24 NOTE — H&P (Signed)
TOTAL KNEE ADMISSION H&P  Patient is being admitted for right total knee arthroplasty.  Subjective:  Chief Complaint:right knee pain.  HPI: Cristina Middleton, 79 y.o. female, has a history of pain and functional disability in the right knee due to arthritis and has failed non-surgical conservative treatments for greater than 12 weeks to includeNSAID's and/or analgesics, corticosteriod injections, viscosupplementation injections, flexibility and strengthening excercises, use of assistive devices and activity modification.  Onset of symptoms was gradual, starting >10 years ago with gradually worsening course since that time. The patient noted no past surgery on the right knee(s).  Patient currently rates pain in the right knee(s) as moderate to severe with activity. Patient has night pain, worsening of pain with activity and weight bearing, pain that interferes with activities of daily living, pain with passive range of motion, crepitus and joint swelling.  Patient has evidence of periarticular osteophytes and joint space narrowing by imaging studies. There is no active infection.  There are no active problems to display for this patient.  Past Medical History  Diagnosis Date  . PONV (postoperative nausea and vomiting)     YRS AGO.....(6-7 YRS)  . GERD (gastroesophageal reflux disease)     TAKES  ACIPHEX  . Diabetes mellitus without complication   . Arthritis   . Sleep apnea     WEARS MASK EVERY NOW AND THEN  . Hypertension   . Hyperthyroidism     NOT SURE WHICH ONE  . Anxiety   . Dislocated shoulder     WAS POPPED BACK IN PLACE  2006???    Past Surgical History  Procedure Date  . Breast surgery     CYST REMOVED -- LEFT  . Lipoma excision     FROM LEFT BACK  . Eye surgery     BILATERAL  . Tonsillectomy      (Not in a hospital admission) Allergies  Allergen Reactions  . Penicillins Hives, Itching and Swelling  . Sulfa Antibiotics Hives, Itching and Swelling    History    Substance Use Topics  . Smoking status: Never Smoker   . Smokeless tobacco: Not on file  . Alcohol Use: No    No family history on file.   Review of Systems  Constitutional: Negative.   HENT: Negative for ear pain.   Eyes: Negative for pain, discharge and redness.  Respiratory: Negative for cough, hemoptysis, sputum production, shortness of breath and wheezing.   Cardiovascular: Negative for chest pain, palpitations and orthopnea.  Gastrointestinal: Positive for constipation. Negative for heartburn, nausea, vomiting, abdominal pain, diarrhea and blood in stool.  Genitourinary: Positive for dysuria. Negative for urgency, frequency, hematuria and flank pain.  Musculoskeletal: Positive for joint pain. Negative for back pain.  Skin: Negative.   Neurological: Negative for dizziness, tingling, focal weakness, loss of consciousness and headaches.  Endo/Heme/Allergies: Does not bruise/bleed easily.  Psychiatric/Behavioral: Negative for depression and substance abuse.    Objective:  Physical Exam  Constitutional: She is oriented to person, place, and time. She appears well-developed and well-nourished. No distress.  HENT:  Head: Normocephalic and atraumatic.  Nose: Nose normal.  Eyes: Conjunctivae normal and EOM are normal. Pupils are equal, round, and reactive to light.  Neck: Normal range of motion. Neck supple.  Cardiovascular: Normal rate, regular rhythm, normal heart sounds and intact distal pulses.   No murmur heard. Respiratory: Effort normal and breath sounds normal. No respiratory distress. She has no wheezes. She exhibits no tenderness.  GI: Soft. Bowel sounds are normal. She   exhibits no distension. There is no tenderness.  Musculoskeletal:       Right knee: She exhibits swelling and deformity. She exhibits normal range of motion. tenderness found. Medial joint line and lateral joint line tenderness noted.       Right knee positive crepitus, stable ligamentous, valgus knee,    Neurological: She is alert and oriented to person, place, and time. No cranial nerve deficit.  Skin: Skin is warm and dry. No rash noted. No erythema.  Psychiatric: She has a normal mood and affect. Her behavior is normal.    Vital signs in last 24 hours: 97.4, 16, 77, 131/70  Labs:   Estimated Body mass index is 25.30 kg/(m^2) as calculated from the following:   Height as of 05/18/12: 5' 3"(1.6 m).   Weight as of 05/18/12: 142 lb 12.8 oz(64.774 kg).   Imaging Review Plain radiographs demonstrate severe degenerative joint disease of the right knee(s). The overall alignment issignificant valgus. The bone quality appears to be good for age and reported activity level.  Assessment/Plan:  End stage arthritis, right knee   The patient history, physical examination, clinical judgment of the provider and imaging studies are consistent with end stage degenerative joint disease of the right knee(s) and total knee arthroplasty is deemed medically necessary. The treatment options including medical management, injection therapy arthroscopy and arthroplasty were discussed at length. The risks and benefits of total knee arthroplasty were presented and reviewed. The risks due to aseptic loosening, infection, stiffness, patella tracking problems, thromboembolic complications and other imponderables were discussed. The patient acknowledged the explanation, agreed to proceed with the plan and consent was signed. Patient is being admitted for inpatient treatment for surgery, pain control, PT, OT, prophylactic antibiotics, VTE prophylaxis, progressive ambulation and ADL's and discharge planning. The patient is planning to be discharged to skilled nursing facility [facility].    May benefit from hospitalist consult for assistance in medical management following surgery.     

## 2012-05-25 ENCOUNTER — Inpatient Hospital Stay (HOSPITAL_COMMUNITY): Payer: Medicare Other | Admitting: Vascular Surgery

## 2012-05-25 ENCOUNTER — Encounter (HOSPITAL_COMMUNITY)
Admission: RE | Disposition: A | Discharge status: Skilled Nursing Facility | Payer: Self-pay | Source: Ambulatory Visit | Attending: Orthopedic Surgery

## 2012-05-25 ENCOUNTER — Inpatient Hospital Stay (HOSPITAL_COMMUNITY): Payer: Medicare Other

## 2012-05-25 ENCOUNTER — Encounter (HOSPITAL_COMMUNITY): Payer: Self-pay | Admitting: Vascular Surgery

## 2012-05-25 ENCOUNTER — Inpatient Hospital Stay (HOSPITAL_COMMUNITY)
Admission: RE | Admit: 2012-05-25 | Discharge: 2012-05-29 | DRG: 470 | Disposition: A | Discharge status: Skilled Nursing Facility | Payer: Medicare Other | Source: Ambulatory Visit | Attending: Orthopedic Surgery | Admitting: Orthopedic Surgery

## 2012-05-25 ENCOUNTER — Encounter (HOSPITAL_COMMUNITY): Payer: Self-pay | Admitting: *Deleted

## 2012-05-25 ENCOUNTER — Encounter (HOSPITAL_COMMUNITY): Payer: Self-pay | Admitting: Internal Medicine

## 2012-05-25 DIAGNOSIS — G4730 Sleep apnea, unspecified: Secondary | ICD-10-CM | POA: Diagnosis present

## 2012-05-25 DIAGNOSIS — M1990 Unspecified osteoarthritis, unspecified site: Secondary | ICD-10-CM | POA: Diagnosis not present

## 2012-05-25 DIAGNOSIS — IMO0002 Reserved for concepts with insufficient information to code with codable children: Secondary | ICD-10-CM | POA: Diagnosis not present

## 2012-05-25 DIAGNOSIS — M1710 Unilateral primary osteoarthritis, unspecified knee: Secondary | ICD-10-CM | POA: Diagnosis not present

## 2012-05-25 DIAGNOSIS — K449 Diaphragmatic hernia without obstruction or gangrene: Secondary | ICD-10-CM | POA: Diagnosis present

## 2012-05-25 DIAGNOSIS — E0590 Thyrotoxicosis, unspecified without thyrotoxic crisis or storm: Secondary | ICD-10-CM | POA: Diagnosis present

## 2012-05-25 DIAGNOSIS — Z5189 Encounter for other specified aftercare: Secondary | ICD-10-CM | POA: Diagnosis not present

## 2012-05-25 DIAGNOSIS — K219 Gastro-esophageal reflux disease without esophagitis: Secondary | ICD-10-CM | POA: Diagnosis present

## 2012-05-25 DIAGNOSIS — D62 Acute posthemorrhagic anemia: Secondary | ICD-10-CM | POA: Diagnosis not present

## 2012-05-25 DIAGNOSIS — Z471 Aftercare following joint replacement surgery: Secondary | ICD-10-CM | POA: Diagnosis not present

## 2012-05-25 DIAGNOSIS — Z01818 Encounter for other preprocedural examination: Secondary | ICD-10-CM | POA: Diagnosis not present

## 2012-05-25 DIAGNOSIS — F411 Generalized anxiety disorder: Secondary | ICD-10-CM | POA: Diagnosis not present

## 2012-05-25 DIAGNOSIS — D649 Anemia, unspecified: Secondary | ICD-10-CM | POA: Diagnosis not present

## 2012-05-25 DIAGNOSIS — K5900 Constipation, unspecified: Secondary | ICD-10-CM | POA: Diagnosis present

## 2012-05-25 DIAGNOSIS — Z96659 Presence of unspecified artificial knee joint: Secondary | ICD-10-CM | POA: Diagnosis not present

## 2012-05-25 DIAGNOSIS — M1711 Unilateral primary osteoarthritis, right knee: Secondary | ICD-10-CM | POA: Diagnosis present

## 2012-05-25 DIAGNOSIS — E119 Type 2 diabetes mellitus without complications: Secondary | ICD-10-CM | POA: Diagnosis not present

## 2012-05-25 DIAGNOSIS — M25569 Pain in unspecified knee: Secondary | ICD-10-CM | POA: Diagnosis not present

## 2012-05-25 DIAGNOSIS — I10 Essential (primary) hypertension: Secondary | ICD-10-CM | POA: Diagnosis not present

## 2012-05-25 DIAGNOSIS — E039 Hypothyroidism, unspecified: Secondary | ICD-10-CM | POA: Diagnosis not present

## 2012-05-25 HISTORY — PX: TOTAL KNEE ARTHROPLASTY: SHX125

## 2012-05-25 LAB — ABO/RH: ABO/RH(D): A POS

## 2012-05-25 LAB — CREATININE, SERUM
Creatinine, Ser: 0.85 mg/dL (ref 0.50–1.10)
GFR calc Af Amer: 68 mL/min — ABNORMAL LOW (ref >90)
GFR calc non Af Amer: 59 mL/min — ABNORMAL LOW (ref >90)

## 2012-05-25 LAB — GLUCOSE, CAPILLARY
Glucose-Capillary: 93 mg/dL (ref 70–99)
Glucose-Capillary: 83 mg/dL (ref 70–99)
Glucose-Capillary: 86 mg/dL (ref 70–99)

## 2012-05-25 LAB — CBC
Hemoglobin: 10.5 g/dL — ABNORMAL LOW (ref 12.0–15.0)
RBC: 3.49 MIL/uL — ABNORMAL LOW (ref 3.87–5.11)
WBC: 7.2 10*3/uL (ref 4.0–10.5)

## 2012-05-25 SURGERY — ARTHROPLASTY, KNEE, TOTAL
Anesthesia: General | Site: Knee | Laterality: Right | Wound class: Clean

## 2012-05-25 MED ORDER — INDAPAMIDE 2.5 MG PO TABS
2.5000 mg | ORAL_TABLET | ORAL | Status: DC
  Filled 2012-05-25: qty 1

## 2012-05-25 MED ORDER — VITAMIN B-12 500 MCG PO TABS
500.0000 ug | ORAL_TABLET | Freq: Every day | ORAL | Status: DC
  Administered 2012-05-25 – 2012-05-28 (×4): 500 ug via ORAL
  Filled 2012-05-25 (×6): qty 1

## 2012-05-25 MED ORDER — LEVOTHYROXINE SODIUM 100 MCG PO TABS
100.0000 ug | ORAL_TABLET | Freq: Every day | ORAL | Status: DC
  Administered 2012-05-26 – 2012-05-29 (×4): 100 ug via ORAL
  Filled 2012-05-25 (×5): qty 1

## 2012-05-25 MED ORDER — PHENOL 1.4 % MT LIQD: 1.0000 | OROMUCOSAL | Status: DC | PRN

## 2012-05-25 MED ORDER — SODIUM CHLORIDE 0.9 % IR SOLN
Status: DC | PRN
  Administered 2012-05-25: 1000 mL
  Administered 2012-05-25: 3000 mL

## 2012-05-25 MED ORDER — CHLORHEXIDINE GLUCONATE 4 % EX LIQD: 60.0000 mL | Freq: Once | CUTANEOUS | Status: DC

## 2012-05-25 MED ORDER — HYDROCODONE-ACETAMINOPHEN 5-325 MG PO TABS
1.0000 | ORAL_TABLET | ORAL | Status: DC | PRN
  Administered 2012-05-25: 1 via ORAL
  Administered 2012-05-25 – 2012-05-28 (×9): 2 via ORAL
  Filled 2012-05-25: qty 1
  Filled 2012-05-25 (×10): qty 2

## 2012-05-25 MED ORDER — ENOXAPARIN SODIUM 30 MG/0.3ML ~~LOC~~ SOLN
30.0000 mg | Freq: Two times a day (BID) | SUBCUTANEOUS | Status: DC
  Administered 2012-05-26 – 2012-05-27 (×4): 30 mg via SUBCUTANEOUS
  Filled 2012-05-25 (×7): qty 0.3

## 2012-05-25 MED ORDER — ESCITALOPRAM OXALATE 20 MG PO TABS
20.0000 mg | ORAL_TABLET | Freq: Every day | ORAL | Status: DC
  Administered 2012-05-25 – 2012-05-29 (×5): 20 mg via ORAL
  Filled 2012-05-25 (×5): qty 1

## 2012-05-25 MED ORDER — FENTANYL CITRATE 0.05 MG/ML IJ SOLN
INTRAMUSCULAR | Status: AC
  Filled 2012-05-25: qty 2

## 2012-05-25 MED ORDER — CHOLECALCIFEROL 10 MCG (400 UNIT) PO TABS
400.0000 [IU] | ORAL_TABLET | Freq: Every day | ORAL | Status: DC
  Administered 2012-05-25 – 2012-05-29 (×5): 400 [IU] via ORAL
  Filled 2012-05-25 (×5): qty 1

## 2012-05-25 MED ORDER — CLINDAMYCIN PHOSPHATE 600 MG/50ML IV SOLN
600.0000 mg | Freq: Four times a day (QID) | INTRAVENOUS | Status: AC
  Administered 2012-05-25 (×2): 600 mg via INTRAVENOUS
  Filled 2012-05-25 (×2): qty 50

## 2012-05-25 MED ORDER — ACETAMINOPHEN 325 MG PO TABS
650.0000 mg | ORAL_TABLET | Freq: Four times a day (QID) | ORAL | Status: DC | PRN
  Filled 2012-05-25: qty 2

## 2012-05-25 MED ORDER — METOCLOPRAMIDE HCL 10 MG PO TABS: 5.0000 mg | ORAL_TABLET | Freq: Three times a day (TID) | ORAL | Status: DC | PRN

## 2012-05-25 MED ORDER — SIMVASTATIN 20 MG PO TABS
20.0000 mg | ORAL_TABLET | Freq: Every evening | ORAL | Status: DC
  Administered 2012-05-25 – 2012-05-28 (×4): 20 mg via ORAL
  Filled 2012-05-25 (×5): qty 1

## 2012-05-25 MED ORDER — FENTANYL CITRATE 0.05 MG/ML IJ SOLN
INTRAMUSCULAR | Status: DC | PRN
  Administered 2012-05-25 (×2): 50 ug via INTRAVENOUS

## 2012-05-25 MED ORDER — PROPOFOL 10 MG/ML IV BOLUS
INTRAVENOUS | Status: DC | PRN
  Administered 2012-05-25: 100 mg via INTRAVENOUS

## 2012-05-25 MED ORDER — OCUVITE-LUTEIN PO CAPS
1.0000 | ORAL_CAPSULE | Freq: Every day | ORAL | Status: DC
  Filled 2012-05-25: qty 1

## 2012-05-25 MED ORDER — ACETAMINOPHEN 10 MG/ML IV SOLN
1000.0000 mg | Freq: Four times a day (QID) | INTRAVENOUS | Status: DC
  Administered 2012-05-25: 1000 mg via INTRAVENOUS

## 2012-05-25 MED ORDER — MENTHOL 3 MG MT LOZG: 1.0000 | LOZENGE | OROMUCOSAL | Status: DC | PRN

## 2012-05-25 MED ORDER — ONDANSETRON HCL 4 MG/2ML IJ SOLN: 4.0000 mg | Freq: Four times a day (QID) | INTRAMUSCULAR | Status: DC | PRN

## 2012-05-25 MED ORDER — ONDANSETRON HCL 4 MG/2ML IJ SOLN
INTRAMUSCULAR | Status: DC | PRN
  Administered 2012-05-25: 4 mg via INTRAVENOUS

## 2012-05-25 MED ORDER — SENNOSIDES-DOCUSATE SODIUM 8.6-50 MG PO TABS: 1.0000 | ORAL_TABLET | Freq: Every evening | ORAL | Status: DC | PRN

## 2012-05-25 MED ORDER — BISACODYL 10 MG RE SUPP
10.0000 mg | Freq: Every day | RECTAL | Status: DC | PRN
  Administered 2012-05-27: 10 mg via RECTAL
  Filled 2012-05-25: qty 1

## 2012-05-25 MED ORDER — LIDOCAINE HCL (CARDIAC) 20 MG/ML IV SOLN
INTRAVENOUS | Status: DC | PRN
  Administered 2012-05-25: 100 mg via INTRAVENOUS

## 2012-05-25 MED ORDER — HYDROMORPHONE HCL PF 1 MG/ML IJ SOLN
0.5000 mg | INTRAMUSCULAR | Status: DC | PRN
  Filled 2012-05-25: qty 1

## 2012-05-25 MED ORDER — HYDROMORPHONE HCL PF 1 MG/ML IJ SOLN: 0.2500 mg | INTRAMUSCULAR | Status: DC | PRN

## 2012-05-25 MED ORDER — CALCIUM CARBONATE 600 MG PO TABS
600.0000 mg | ORAL_TABLET | Freq: Every day | ORAL | Status: DC
  Filled 2012-05-25: qty 1

## 2012-05-25 MED ORDER — INSULIN ASPART 100 UNIT/ML ~~LOC~~ SOLN: 0.0000 [IU] | Freq: Three times a day (TID) | SUBCUTANEOUS | Status: DC

## 2012-05-25 MED ORDER — FENTANYL CITRATE 0.05 MG/ML IJ SOLN: 50.0000 ug | INTRAMUSCULAR | Status: DC | PRN

## 2012-05-25 MED ORDER — INSULIN ASPART 100 UNIT/ML ~~LOC~~ SOLN: 0.0000 [IU] | Freq: Every day | SUBCUTANEOUS | Status: DC

## 2012-05-25 MED ORDER — LACTATED RINGERS IV SOLN
INTRAVENOUS | Status: DC
  Administered 2012-05-25 (×2): via INTRAVENOUS

## 2012-05-25 MED ORDER — ONDANSETRON HCL 4 MG PO TABS: 4.0000 mg | ORAL_TABLET | Freq: Four times a day (QID) | ORAL | Status: DC | PRN

## 2012-05-25 MED ORDER — PROSIGHT PO TABS
1.0000 | ORAL_TABLET | Freq: Every day | ORAL | Status: DC
  Administered 2012-05-25 – 2012-05-29 (×5): 1 via ORAL
  Filled 2012-05-25 (×6): qty 1

## 2012-05-25 MED ORDER — DOCUSATE SODIUM 100 MG PO CAPS
100.0000 mg | ORAL_CAPSULE | Freq: Two times a day (BID) | ORAL | Status: DC
  Administered 2012-05-26 – 2012-05-29 (×7): 100 mg via ORAL
  Filled 2012-05-25 (×8): qty 1

## 2012-05-25 MED ORDER — FLEET ENEMA 7-19 GM/118ML RE ENEM: 1.0000 | ENEMA | Freq: Once | RECTAL | Status: AC | PRN

## 2012-05-25 MED ORDER — PHENYLEPHRINE HCL 10 MG/ML IJ SOLN
INTRAMUSCULAR | Status: DC | PRN
  Administered 2012-05-25: 40 ug via INTRAVENOUS
  Administered 2012-05-25: 120 ug via INTRAVENOUS
  Administered 2012-05-25: 80 ug via INTRAVENOUS
  Administered 2012-05-25: 120 ug via INTRAVENOUS
  Administered 2012-05-25 (×2): 80 ug via INTRAVENOUS
  Administered 2012-05-25: 120 ug via INTRAVENOUS
  Administered 2012-05-25 (×2): 80 ug via INTRAVENOUS
  Administered 2012-05-25: 40 ug via INTRAVENOUS
  Administered 2012-05-25: 120 ug via INTRAVENOUS

## 2012-05-25 MED ORDER — ARTIFICIAL TEARS OP OINT
TOPICAL_OINTMENT | OPHTHALMIC | Status: DC | PRN
  Administered 2012-05-25: 1 via OPHTHALMIC

## 2012-05-25 MED ORDER — INSULIN ASPART 100 UNIT/ML ~~LOC~~ SOLN: 3.0000 [IU] | Freq: Three times a day (TID) | SUBCUTANEOUS | Status: DC

## 2012-05-25 MED ORDER — ONDANSETRON HCL 4 MG/2ML IJ SOLN: 4.0000 mg | Freq: Once | INTRAMUSCULAR | Status: DC | PRN

## 2012-05-25 MED ORDER — CALCIUM CARBONATE 1250 (500 CA) MG PO TABS
1.0000 | ORAL_TABLET | Freq: Every day | ORAL | Status: DC
  Administered 2012-05-26 – 2012-05-29 (×4): 500 mg via ORAL
  Filled 2012-05-25 (×6): qty 1

## 2012-05-25 MED ORDER — SODIUM CHLORIDE 0.9 % IV SOLN
INTRAVENOUS | Status: DC
  Administered 2012-05-25 – 2012-05-26 (×2): via INTRAVENOUS

## 2012-05-25 MED ORDER — SODIUM CHLORIDE 0.9 % IV SOLN: INTRAVENOUS | Status: DC

## 2012-05-25 MED ORDER — ACETAMINOPHEN 10 MG/ML IV SOLN
INTRAVENOUS | Status: AC
  Filled 2012-05-25: qty 100

## 2012-05-25 MED ORDER — METOCLOPRAMIDE HCL 5 MG/ML IJ SOLN: 5.0000 mg | Freq: Three times a day (TID) | INTRAMUSCULAR | Status: DC | PRN

## 2012-05-25 MED ORDER — INSULIN ASPART 100 UNIT/ML ~~LOC~~ SOLN
0.0000 [IU] | Freq: Three times a day (TID) | SUBCUTANEOUS | Status: DC
  Administered 2012-05-26 – 2012-05-27 (×2): 1 [IU] via SUBCUTANEOUS

## 2012-05-25 MED ORDER — PANTOPRAZOLE SODIUM 40 MG PO TBEC
40.0000 mg | DELAYED_RELEASE_TABLET | Freq: Every day | ORAL | Status: DC
  Administered 2012-05-25 – 2012-05-29 (×5): 40 mg via ORAL
  Filled 2012-05-25 (×5): qty 1

## 2012-05-25 SURGICAL SUPPLY — 68 items
BANDAGE ELASTIC 4 VELCRO ST LF (GAUZE/BANDAGES/DRESSINGS) ×2 IMPLANT
BANDAGE ELASTIC 6 VELCRO ST LF (GAUZE/BANDAGES/DRESSINGS) ×2 IMPLANT
BANDAGE ESMARK 6X9 LF (GAUZE/BANDAGES/DRESSINGS) ×1 IMPLANT
BLADE SAGITTAL 25.0X1.19X90 (BLADE) ×2 IMPLANT
BLADE SAW SAG 90X13X1.27 (BLADE) ×2 IMPLANT
BLADE SURG 10 STRL SS (BLADE) ×1 IMPLANT
BNDG CMPR 9X6 STRL LF SNTH (GAUZE/BANDAGES/DRESSINGS) ×1
BNDG ESMARK 6X9 LF (GAUZE/BANDAGES/DRESSINGS) ×2
BONE CEMENT GENTAMICIN (Cement) ×4 IMPLANT
BOWL SMART MIX CTS (DISPOSABLE) ×2 IMPLANT
CEMENT BONE GENTAMICIN 40 (Cement) IMPLANT
CLOTH BEACON ORANGE TIMEOUT ST (SAFETY) ×2 IMPLANT
COVER BACK TABLE 24X17X13 BIG (DRAPES) IMPLANT
COVER SURGICAL LIGHT HANDLE (MISCELLANEOUS) ×2 IMPLANT
CUFF TOURNIQUET SINGLE 34IN LL (TOURNIQUET CUFF) ×2 IMPLANT
CUFF TOURNIQUET SINGLE 44IN (TOURNIQUET CUFF) IMPLANT
DRAPE INCISE IOBAN 66X45 STRL (DRAPES) IMPLANT
DRAPE ORTHO SPLIT 77X108 STRL (DRAPES) ×4
DRAPE SURG ORHT 6 SPLT 77X108 (DRAPES) ×2 IMPLANT
DRAPE U-SHAPE 47X51 STRL (DRAPES) ×2 IMPLANT
DRSG ADAPTIC 3X8 NADH LF (GAUZE/BANDAGES/DRESSINGS) ×2 IMPLANT
DRSG PAD ABDOMINAL 8X10 ST (GAUZE/BANDAGES/DRESSINGS) ×3 IMPLANT
DURAPREP 26ML APPLICATOR (WOUND CARE) ×2 IMPLANT
ELECT REM PT RETURN 9FT ADLT (ELECTROSURGICAL) ×2
ELECTRODE REM PT RTRN 9FT ADLT (ELECTROSURGICAL) ×1 IMPLANT
EVACUATOR 1/8 PVC DRAIN (DRAIN) ×2 IMPLANT
FACESHIELD LNG OPTICON STERILE (SAFETY) ×3 IMPLANT
FLOSEAL 10ML (HEMOSTASIS) IMPLANT
GLOVE BIOGEL PI IND STRL 6.5 (GLOVE) IMPLANT
GLOVE BIOGEL PI IND STRL 7.0 (GLOVE) IMPLANT
GLOVE BIOGEL PI IND STRL 8 (GLOVE) ×4 IMPLANT
GLOVE BIOGEL PI INDICATOR 6.5 (GLOVE) ×1
GLOVE BIOGEL PI INDICATOR 7.0 (GLOVE) ×3
GLOVE BIOGEL PI INDICATOR 8 (GLOVE) ×4
GLOVE ECLIPSE 6.5 STRL STRAW (GLOVE) ×3 IMPLANT
GLOVE ORTHO TXT STRL SZ7.5 (GLOVE) ×6 IMPLANT
GLOVE SURG ORTHO 8.0 STRL STRW (GLOVE) ×6 IMPLANT
GLOVE SURG SS PI 6.5 STRL IVOR (GLOVE) ×1 IMPLANT
GOWN PREVENTION PLUS XLARGE (GOWN DISPOSABLE) ×5 IMPLANT
GOWN PREVENTION PLUS XXLARGE (GOWN DISPOSABLE) ×2 IMPLANT
GOWN STRL NON-REIN LRG LVL3 (GOWN DISPOSABLE) ×2 IMPLANT
HANDPIECE INTERPULSE COAX TIP (DISPOSABLE) ×2
HOOD PEEL AWAY FACE SHEILD DIS (HOOD) ×2 IMPLANT
IMMOBILIZER KNEE 22 UNIV (SOFTGOODS) ×1 IMPLANT
KIT BASIN OR (CUSTOM PROCEDURE TRAY) ×2 IMPLANT
KIT ROOM TURNOVER OR (KITS) ×2 IMPLANT
MANIFOLD NEPTUNE II (INSTRUMENTS) ×2 IMPLANT
NEEDLE 22X1 1/2 (OR ONLY) (NEEDLE) IMPLANT
NS IRRIG 1000ML POUR BTL (IV SOLUTION) ×2 IMPLANT
PACK TOTAL JOINT (CUSTOM PROCEDURE TRAY) ×2 IMPLANT
PAD ARMBOARD 7.5X6 YLW CONV (MISCELLANEOUS) ×4 IMPLANT
PAD CAST 4YDX4 CTTN HI CHSV (CAST SUPPLIES) ×1 IMPLANT
PADDING CAST COTTON 4X4 STRL (CAST SUPPLIES) ×2
PADDING CAST COTTON 6X4 STRL (CAST SUPPLIES) ×2 IMPLANT
SET HNDPC FAN SPRY TIP SCT (DISPOSABLE) ×1 IMPLANT
SPONGE GAUZE 4X4 12PLY (GAUZE/BANDAGES/DRESSINGS) ×2 IMPLANT
STAPLER VISISTAT 35W (STAPLE) ×2 IMPLANT
SUCTION FRAZIER TIP 10 FR DISP (SUCTIONS) ×1 IMPLANT
SUT ETHIBOND NAB CT1 #1 30IN (SUTURE) ×6 IMPLANT
SUT VIC AB 0 CT1 27 (SUTURE) ×2
SUT VIC AB 0 CT1 27XBRD ANBCTR (SUTURE) ×1 IMPLANT
SUT VIC AB 2-0 CT1 27 (SUTURE) ×4
SUT VIC AB 2-0 CT1 TAPERPNT 27 (SUTURE) ×2 IMPLANT
SYR CONTROL 10ML LL (SYRINGE) IMPLANT
TOWEL OR 17X24 6PK STRL BLUE (TOWEL DISPOSABLE) ×2 IMPLANT
TOWEL OR 17X26 10 PK STRL BLUE (TOWEL DISPOSABLE) ×2 IMPLANT
TRAY FOLEY CATH 14FR (SET/KITS/TRAYS/PACK) ×2 IMPLANT
WATER STERILE IRR 1000ML POUR (IV SOLUTION) ×4 IMPLANT

## 2012-05-25 NOTE — Consult Note (Signed)
[facility] Medical Consultation  Cristina Middleton [facility] ZOX:[id number] DOB: [birth year] DOA: 05/25/2012 PCP: Pcp Not In System   Requesting physician: Dr. Madelon Lips Date of consultation: 1.24.2014 Reason for consultation: Diabetes mellitus management  Impression/Recommendations:  Osteoarthritis of right knee - Status post total knee arthroplasty on 05/25/2012 - Pain management anticoagulation per ortho. - PT consult.  HTN (hypertension) - At this time her blood pressures currently well controlled. - I will resume her indapamide tomorrow morning. In the meantime we'll continue to follow her blood pressure very closely. - The narcotics which she is getting for pain will make her Bp low.  Diabetes mellitus, type 2 - She's on metformin at home. I will go ahead and stop this. - I think she will do fine with just sliding scale while in the hospital. Continue check CBGs a.c. and at bedtime.  Sleep apnea: - We'll get a respiratory consult to start BiPAP, her family is to bring her settings from home.  Hypothyroidism - Continue Synthroid.  I will followup again tomorrow. Please contact me if I can be of assistance in the meanwhile. Thank you for this consultation.    Chief Complaint: Management of Hypertension and diabetes consult  HPI:  This is an 79 year old female with past medical history of hypertension and diabetes, no history of dementia, with past medical history of severe right knee osteoarthritis which has failed conservative management, status post total knee arthroplasty on 05/26/2011. Currently asymptomatic and we were consulted for management of her diabetes and her hypertension post operatively. Patient is a little bit groggy and sleepy but able to answer simple questions. She knows she is in: Hospital, She knows she just had her total knee replaced. Does not know the time.  She doesn't have any complaints at that time. She is just hungry and wants to eat.  Review of  Systems:  Constitutional:  No weight loss, night sweats, Fevers, chills, fatigue.  HEENT:  No headaches, Difficulty swallowing,Tooth/dental problems,Sore throat,  No sneezing, itching, ear ache, nasal congestion, post nasal drip,  Cardio-vascular:  No chest pain, Orthopnea, PND, swelling in lower extremities, anasarca, dizziness, palpitations  GI:  No heartburn, indigestion, abdominal pain, nausea, vomiting, diarrhea, change in bowel habits, loss of appetite  Resp:  No shortness of breath with exertion or at rest. No excess mucus, no productive cough, No non-productive cough, No coughing up of blood.No change in color of mucus.No wheezing.No chest wall deformity  Skin:  no rash or lesions.  GU:  no dysuria, change in color of urine, no urgency or frequency. No flank pain.  Musculoskeletal:  No joint pain or swelling. No decreased range of motion. No back pain.  Psych:  No change in mood or affect. No depression or anxiety. No memory loss.    Past Medical History  Diagnosis Date  . PONV (postoperative nausea and vomiting)     YRS AGO.....(6-7 YRS)  . GERD (gastroesophageal reflux disease)     TAKES  ACIPHEX  . Diabetes mellitus without complication   . Arthritis   . Sleep apnea     WEARS MASK EVERY NOW AND THEN  . Hypertension   . Hyperthyroidism     NOT SURE WHICH ONE  . Anxiety   . Dislocated shoulder     WAS POPPED BACK IN PLACE  2006???   Past Surgical History  Procedure Date  . Breast surgery     CYST REMOVED -- LEFT  . Lipoma excision     FROM LEFT BACK  .  Eye surgery     BILATERAL  . Tonsillectomy    Social History:  reports that she has never smoked. She does not have any smokeless tobacco history on file. She reports that she does not drink alcohol or use illicit drugs.  Allergies  Allergen Reactions  . Penicillins Hives, Itching and Swelling  . Sulfa Antibiotics Hives, Itching and Swelling   Family History  Problem Relation Age of Onset  . Other  Mother   . Other Father     Prior to Admission medications   Medication Sig Start Date End Date Taking? Authorizing Provider  aspirin EC 81 MG tablet Take 81 mg by mouth daily.   Yes Historical Provider, MD  calcium carbonate (OS-CAL) 600 MG TABS Take 600 mg by mouth daily.   Yes Historical Provider, MD  cholecalciferol (VITAMIN D) 400 UNITS TABS Take 400 Units by mouth daily.   Yes Historical Provider, MD  CINNAMON PO Take 2 capsules by mouth daily.    Yes Historical Provider, MD  escitalopram (LEXAPRO) 20 MG tablet Take 20 mg by mouth daily.   Yes Historical Provider, MD  indapamide (LOZOL) 2.5 MG tablet Take 2.5 mg by mouth See admin instructions. 3 days a week (Monday, Wednesday, Friday)   Yes Historical Provider, MD  levothyroxine (SYNTHROID, LEVOTHROID) 100 MCG tablet Take 100 mcg by mouth daily.   Yes Historical Provider, MD  meloxicam (MOBIC) 15 MG tablet Take 15 mg by mouth daily.   Yes Historical Provider, MD  metFORMIN (GLUCOPHAGE) 500 MG tablet Take 500 mg by mouth once.   Yes Historical Provider, MD  multivitamin-lutein (OCUVITE-LUTEIN) CAPS Take 1 capsule by mouth daily.   Yes Historical Provider, MD  RABEprazole (ACIPHEX) 20 MG tablet Take 20 mg by mouth daily.   Yes Historical Provider, MD  simvastatin (ZOCOR) 20 MG tablet Take 20 mg by mouth every evening.   Yes Historical Provider, MD  vitamin B-12 (CYANOCOBALAMIN) 500 MCG tablet Take 500 mcg by mouth daily.   Yes Historical Provider, MD   Physical Exam: Blood pressure 120/55, pulse 73, temperature 97 F (36.1 C), temperature source Oral, resp. rate 17, SpO2 100.00%. Filed Vitals:   05/25/12 1303 05/25/12 1315 05/25/12 1318 05/25/12 1320  BP: 115/48  120/55   Pulse:  65  73  Temp:      TempSrc:      Resp:  11  17  SpO2:  98%  100%     General:  Awake alert and oriented x2  Eyes: Anicteric  ENT: Dry mucous membranes  Neck: No JVD  Cardiovascular: Regular rate and rhythm with a positive S1 and S2 no murmurs  rubs gallops  Respiratory: Good air movement and clear to auscultation  Abdomen: Positive bowel sounds nontender nondistended and soft  Skin: No rashes or ulcerations  Musculoskeletal: Intact  Psychiatric: Appropriate  Neurologic: Awake alert and oriented x2 the coherent and fluent language she is moving all her 4 extremities without any difficulties except for right leg due to the cast. Her extraocular movements are intact, she has no facial droop no pronator drift. And sensation is intact throughout.  Labs on Admission:  Basic Metabolic Panel: No results found for this basename: NA:5,K:5,CL:5,CO2:5,GLUCOSE:5,BUN:5,CREATININE:5,CALCIUM:5,MG:5,PHOS:5 in the last 168 hours Liver Function Tests: No results found for this basename: AST:5,ALT:5,ALKPHOS:5,BILITOT:5,PROT:5,ALBUMIN:5 in the last 168 hours No results found for this basename: LIPASE:5,AMYLASE:5 in the last 168 hours No results found for this basename: AMMONIA:5 in the last 168 hours CBC: No results found for this  basename: WBC:5,NEUTROABS:5,HGB:5,HCT:5,MCV:5,PLT:5 in the last 168 hours Cardiac Enzymes: No results found for this basename: CKTOTAL:5,CKMB:5,CKMBINDEX:5,TROPONINI:5 in the last 168 hours BNP: No components found with this basename: POCBNP:5 CBG:  Lab 05/25/12 1255 05/25/12 0838  GLUCAP 93 110*    Radiological Exams on Admission: Dg Chest 2 View  05/25/2012  *RADIOLOGY REPORT*  Clinical Data: Preop total knee replacement.  Hypertension, diabetes.  CHEST - 2 VIEW  Comparison: 11/07/2011  Findings: Moderate sized hiatal hernia, stable.  Heart is normal size.  Minimal left base atelectasis or scar.  Right lung is clear. No effusions or acute bony abnormality.  IMPRESSION: Moderate hiatal hernia.  Left base atelectasis or scar.   Original Report Authenticated By: Charlett Nose, M.D.     EKG: Sinus rhythm  Time spent: [address] Rosine Beat [facility] Pager [phone]  If 7PM-7AM, please contact  night-coverage [email or web address] Password [email or web address] 05/25/2012, 1:23 PM

## 2012-05-25 NOTE — Preoperative (Signed)
Beta Blockers   Reason not to administer Beta Blockers:Not Applicable 

## 2012-05-25 NOTE — Anesthesia Procedure Notes (Signed)
Procedure Name: LMA Insertion Date/Time: 05/25/2012 10:40 AM Performed by: Jefm Miles E Pre-anesthesia Checklist: Timeout performed, Patient identified, Emergency Drugs available, Suction available and Patient being monitored Patient Re-evaluated:Patient Re-evaluated prior to inductionOxygen Delivery Method: Circle system utilized Preoxygenation: Pre-oxygenation with 100% oxygen Intubation Type: IV induction Ventilation: Mask ventilation without difficulty LMA: LMA inserted LMA Size: 4.0 Number of attempts: 1 Placement Confirmation: positive ETCO2 and breath sounds checked- equal and bilateral Tube secured with: Tape Dental Injury: Teeth and Oropharynx as per pre-operative assessment

## 2012-05-25 NOTE — Progress Notes (Signed)
UR COMPLETED  

## 2012-05-25 NOTE — Anesthesia Preprocedure Evaluation (Addendum)
Anesthesia Evaluation  Patient identified by MRN, date of birth, ID band Patient awake    Reviewed: Allergy & Precautions, H&P , NPO status , Patient's Chart, lab work & pertinent test results  History of Anesthesia Complications Negative for: history of anesthetic complications  Airway Mallampati: I TM Distance: >3 FB Neck ROM: Full    Dental  (+) Edentulous Upper, Edentulous Lower and Dental Advisory Given   Pulmonary sleep apnea and Continuous Positive Airway Pressure Ventilation ,          Cardiovascular hypertension, Rhythm:Regular Rate:Normal     Neuro/Psych Anxiety    GI/Hepatic GERD-  ,  Endo/Other  diabetes, Type 2Hyperthyroidism   Renal/GU      Musculoskeletal   Abdominal   Peds  Hematology   Anesthesia Other Findings   Reproductive/Obstetrics                           Anesthesia Physical Anesthesia Plan  ASA: III  Anesthesia Plan: General   Post-op Pain Management:    Induction: Intravenous  Airway Management Planned: LMA  Additional Equipment:   Intra-op Plan:   Post-operative Plan: Extubation in OR  Informed Consent: I have reviewed the patients History and Physical, chart, labs and discussed the procedure including the risks, benefits and alternatives for the proposed anesthesia with the patient or authorized representative who has indicated his/her understanding and acceptance.     Plan Discussed with: CRNA, Anesthesiologist and Surgeon  Anesthesia Plan Comments:         Anesthesia Quick Evaluation

## 2012-05-25 NOTE — Interval H&P Note (Signed)
History and Physical Interval Note:  05/25/2012 10:23 AM  Cristina Middleton  has presented today for surgery, with the diagnosis of OA R knee  The various methods of treatment have been discussed with the patient and family. After consideration of risks, benefits and other options for treatment, the patient has consented to  Procedure(s) (LRB) with comments: TOTAL KNEE ARTHROPLASTY (Right) as a surgical intervention .  The patient's history has been reviewed, patient examined, no change in status, stable for surgery.  I have reviewed the patient's chart and labs.  Questions were answered to the patient's satisfaction.     Alexsander Cavins JR,W D

## 2012-05-25 NOTE — Transfer of Care (Signed)
Immediate Anesthesia Transfer of Care Note  Patient: Cristina Middleton  Procedure(s) Performed: Procedure(s) (LRB) with comments: TOTAL KNEE ARTHROPLASTY (Right)  Patient Location: PACU  Anesthesia Type:General  Level of Consciousness: awake, alert  and oriented  Airway & Oxygen Therapy: Patient Spontanous Breathing and Patient connected to nasal cannula oxygen  Post-op Assessment: Report given to PACU RN  Post vital signs: Reviewed and stable  Complications: No apparent anesthesia complications

## 2012-05-25 NOTE — Progress Notes (Signed)
Orthopedic Tech Progress Note Patient Details:  Cristina Middleton [facility] [birth year] [id number] CPM applied Rt. Extremity with 0-60.  Overhead frame applied to bed. CPM Right Knee CPM Right Knee: On Right Knee Flexion (Degrees): 60  Right Knee Extension (Degrees): 0    [name] 05/25/2012, 2:17 PM

## 2012-05-25 NOTE — H&P (View-Only) (Signed)
TOTAL KNEE ADMISSION H&P  Patient is being admitted for right total knee arthroplasty.  Subjective:  Chief Complaint:right knee pain.  HPI: Cristina Middleton, 79 y.o. female, has a history of pain and functional disability in the right knee due to arthritis and has failed non-surgical conservative treatments for greater than 12 weeks to includeNSAID's and/or analgesics, corticosteriod injections, viscosupplementation injections, flexibility and strengthening excercises, use of assistive devices and activity modification.  Onset of symptoms was gradual, starting >10 years ago with gradually worsening course since that time. The patient noted no past surgery on the right knee(s).  Patient currently rates pain in the right knee(s) as moderate to severe with activity. Patient has night pain, worsening of pain with activity and weight bearing, pain that interferes with activities of daily living, pain with passive range of motion, crepitus and joint swelling.  Patient has evidence of periarticular osteophytes and joint space narrowing by imaging studies. There is no active infection.  There are no active problems to display for this patient.  Past Medical History  Diagnosis Date  . PONV (postoperative nausea and vomiting)     YRS AGO.....(6-7 YRS)  . GERD (gastroesophageal reflux disease)     TAKES  ACIPHEX  . Diabetes mellitus without complication   . Arthritis   . Sleep apnea     WEARS MASK EVERY NOW AND THEN  . Hypertension   . Hyperthyroidism     NOT SURE WHICH ONE  . Anxiety   . Dislocated shoulder     WAS POPPED BACK IN PLACE  2006???    Past Surgical History  Procedure Date  . Breast surgery     CYST REMOVED -- LEFT  . Lipoma excision     FROM LEFT BACK  . Eye surgery     BILATERAL  . Tonsillectomy      (Not in a hospital admission) Allergies  Allergen Reactions  . Penicillins Hives, Itching and Swelling  . Sulfa Antibiotics Hives, Itching and Swelling    History    Substance Use Topics  . Smoking status: Never Smoker   . Smokeless tobacco: Not on file  . Alcohol Use: No    No family history on file.   Review of Systems  Constitutional: Negative.   HENT: Negative for ear pain.   Eyes: Negative for pain, discharge and redness.  Respiratory: Negative for cough, hemoptysis, sputum production, shortness of breath and wheezing.   Cardiovascular: Negative for chest pain, palpitations and orthopnea.  Gastrointestinal: Positive for constipation. Negative for heartburn, nausea, vomiting, abdominal pain, diarrhea and blood in stool.  Genitourinary: Positive for dysuria. Negative for urgency, frequency, hematuria and flank pain.  Musculoskeletal: Positive for joint pain. Negative for back pain.  Skin: Negative.   Neurological: Negative for dizziness, tingling, focal weakness, loss of consciousness and headaches.  Endo/Heme/Allergies: Does not bruise/bleed easily.  Psychiatric/Behavioral: Negative for depression and substance abuse.    Objective:  Physical Exam  Constitutional: She is oriented to person, place, and time. She appears well-developed and well-nourished. No distress.  HENT:  Head: Normocephalic and atraumatic.  Nose: Nose normal.  Eyes: Conjunctivae normal and EOM are normal. Pupils are equal, round, and reactive to light.  Neck: Normal range of motion. Neck supple.  Cardiovascular: Normal rate, regular rhythm, normal heart sounds and intact distal pulses.   No murmur heard. Respiratory: Effort normal and breath sounds normal. No respiratory distress. She has no wheezes. She exhibits no tenderness.  GI: Soft. Bowel sounds are normal. She  exhibits no distension. There is no tenderness.  Musculoskeletal:       Right knee: She exhibits swelling and deformity. She exhibits normal range of motion. tenderness found. Medial joint line and lateral joint line tenderness noted.       Right knee positive crepitus, stable ligamentous, valgus knee,    Neurological: She is alert and oriented to person, place, and time. No cranial nerve deficit.  Skin: Skin is warm and dry. No rash noted. No erythema.  Psychiatric: She has a normal mood and affect. Her behavior is normal.    Vital signs in last 24 hours: 97.4, 16, 77, 131/70  Labs:   Estimated Body mass index is 25.30 kg/(m^2) as calculated from the following:   Height as of 05/18/12: 5\' 3" (1.6 m).   Weight as of 05/18/12: 142 lb 12.8 oz(64.774 kg).   Imaging Review Plain radiographs demonstrate severe degenerative joint disease of the right knee(s). The overall alignment issignificant valgus. The bone quality appears to be good for age and reported activity level.  Assessment/Plan:  End stage arthritis, right knee   The patient history, physical examination, clinical judgment of the provider and imaging studies are consistent with end stage degenerative joint disease of the right knee(s) and total knee arthroplasty is deemed medically necessary. The treatment options including medical management, injection therapy arthroscopy and arthroplasty were discussed at length. The risks and benefits of total knee arthroplasty were presented and reviewed. The risks due to aseptic loosening, infection, stiffness, patella tracking problems, thromboembolic complications and other imponderables were discussed. The patient acknowledged the explanation, agreed to proceed with the plan and consent was signed. Patient is being admitted for inpatient treatment for surgery, pain control, PT, OT, prophylactic antibiotics, VTE prophylaxis, progressive ambulation and ADL's and discharge planning. The patient is planning to be discharged to skilled nursing facility [facility].    May benefit from hospitalist consult for assistance in medical management following surgery.

## 2012-05-25 NOTE — Anesthesia Postprocedure Evaluation (Signed)
  Anesthesia Post-op Note  Patient: Lashawnda Hancox Eckerson  Procedure(s) Performed: Procedure(s) (LRB) with comments: TOTAL KNEE ARTHROPLASTY (Right)  Patient Location: PACU  Anesthesia Type:General  Level of Consciousness: awake, alert , oriented and patient cooperative  Airway and Oxygen Therapy: Patient Spontanous Breathing  Post-op Pain: mild  Post-op Assessment: Post-op Vital signs reviewed, Patient's Cardiovascular Status Stable, Respiratory Function Stable, Patent Airway, No signs of Nausea or vomiting and Pain level controlled  Post-op Vital Signs: stable  Complications: No apparent anesthesia complications

## 2012-05-26 DIAGNOSIS — D62 Acute posthemorrhagic anemia: Secondary | ICD-10-CM

## 2012-05-26 DIAGNOSIS — I10 Essential (primary) hypertension: Secondary | ICD-10-CM | POA: Diagnosis not present

## 2012-05-26 DIAGNOSIS — E039 Hypothyroidism, unspecified: Secondary | ICD-10-CM | POA: Diagnosis not present

## 2012-05-26 DIAGNOSIS — E119 Type 2 diabetes mellitus without complications: Secondary | ICD-10-CM | POA: Diagnosis not present

## 2012-05-26 LAB — CBC
HCT: 29 % — ABNORMAL LOW (ref 36.0–46.0)
MCH: 30.9 pg (ref 26.0–34.0)
MCV: 89.5 fL (ref 78.0–100.0)
Platelets: 148 10*3/uL — ABNORMAL LOW (ref 150–400)
Platelets: 134 10*3/uL — ABNORMAL LOW (ref 150–400)
RBC: 2.78 MIL/uL — ABNORMAL LOW (ref 3.87–5.11)
RBC: 3.24 MIL/uL — ABNORMAL LOW (ref 3.87–5.11)
RDW: 15.8 % — ABNORMAL HIGH (ref 11.5–15.5)
WBC: 5.3 10*3/uL (ref 4.0–10.5)
WBC: 6.9 10*3/uL (ref 4.0–10.5)

## 2012-05-26 LAB — HEMOGLOBIN A1C: Mean Plasma Glucose: 140 mg/dL — ABNORMAL HIGH (ref <117)

## 2012-05-26 LAB — BASIC METABOLIC PANEL
CO2: 27 mEq/L (ref 19–32)
Chloride: 99 mEq/L (ref 96–112)
GFR calc Af Amer: 70 mL/min — ABNORMAL LOW (ref >90)
Sodium: 135 mEq/L (ref 135–145)

## 2012-05-26 LAB — GLUCOSE, CAPILLARY: Glucose-Capillary: 104 mg/dL — ABNORMAL HIGH (ref 70–99)

## 2012-05-26 MED ORDER — DIPHENHYDRAMINE HCL 25 MG PO CAPS
25.0000 mg | ORAL_CAPSULE | Freq: Once | ORAL | Status: AC
  Administered 2012-05-26: 25 mg via ORAL
  Filled 2012-05-26: qty 1

## 2012-05-26 MED ORDER — ACETAMINOPHEN 325 MG PO TABS: 650.0000 mg | ORAL_TABLET | Freq: Once | ORAL | Status: DC

## 2012-05-26 MED ORDER — FUROSEMIDE 10 MG/ML IJ SOLN
20.0000 mg | Freq: Once | INTRAMUSCULAR | Status: AC
  Administered 2012-05-26: 20 mg via INTRAVENOUS
  Filled 2012-05-26: qty 2

## 2012-05-26 NOTE — Progress Notes (Signed)
[facility] ([facility]S)  Subjective: 1 Day Post-Op Procedure(s) (LRB): TOTAL KNEE ARTHROPLASTY (Right)   Doing well Slept very well. Went to bed around 2200 yesterday and woke up around 0700 today Denies CP, no SOB No n/v No lightheadedness Denies numbness or tingling  Pain controlled   Objective: Current Vitals Blood pressure 107/52, pulse 86, temperature 99 F (37.2 C), temperature source Oral, resp. rate 16, height 5\' 3"  (1.6 m), weight 64.774 kg (142 lb 12.8 oz), SpO2 96.00%. Vital signs in last 24 hours: Temp:  [97 F (36.1 C)-99 F (37.2 C)] 99 F (37.2 C) (01/25 0547) Pulse Rate:  [60-86] 86  (01/25 0547) Resp:  [11-21] 16  (01/25 0547) BP: (107-135)/(48-75) 107/52 mmHg (01/25 0547) SpO2:  [94 %-100 %] 96 % (01/25 0547) Weight:  [64.774 kg (142 lb 12.8 oz)] 64.774 kg (142 lb 12.8 oz) (01/24 1930)  Intake/Output from previous day: 01/24 0701 - 01/25 0700 In: 2150 [I.V.:2150] Out: 1315 [Urine:750; Drains:515; Blood:50] Intake/Output      01/24 0701 - 01/25 0700 01/25 0701 - 01/26 0700   I.V. (mL/kg) 2150 (33.2)    Total Intake(mL/kg) 2150 (33.2)    Urine (mL/kg/hr) 750 (0.5)    Drains 515    Blood 50    Total Output 1315    Net +835            LABS  Basename 05/26/12 0610 05/25/12 1552  HGB 8.3* 10.5*    Basename 05/26/12 0610 05/25/12 1552  WBC 5.3 7.2  RBC 2.78* 3.49*  HCT 25.0* 31.2*  PLT 148* 175    Basename 05/25/12 1552  NA --  K --  CL --  CO2 --  BUN --  CREATININE 0.85  GLUCOSE --  CALCIUM --   No results found for this basename: LABPT:2,INR:2 in the last 72 hours  CBG (last 3)   Basename 05/25/12 2140 05/25/12 1636 05/25/12 1255  GLUCAP 86 83 93      Physical Exam  Gen: awake and alert, NAD. Appears very comfortable Lungs:clear Cardiac:s1 and s2 Abd:+ BS, NT Ext:      Right Lower Extremity  Dressing c/d/i  Dpn, spn, TN sensation intact  EHL, FHL, AT, PT, peroneals, gastroc motor intact  Ext warm  No  DCT  Compartments soft and NT  + DP pulse     Imaging Dg Chest 2 View  05/25/2012  *RADIOLOGY REPORT*  Clinical Data: Preop total knee replacement.  Hypertension, diabetes.  CHEST - 2 VIEW  Comparison: 11/07/2011  Findings: Moderate sized hiatal hernia, stable.  Heart is normal size.  Minimal left base atelectasis or scar.  Right lung is clear. No effusions or acute bony abnormality.  IMPRESSION: Moderate hiatal hernia.  Left base atelectasis or scar.   Original Report Authenticated By: Charlett Nose, M.D.     Assessment/Plan: 1 Day Post-Op Procedure(s) (LRB): TOTAL KNEE ARTHROPLASTY (Right)  79 y/o female s/p R TKA  1. R TKA POD 1  WBAT  PT/OT today  Dressing change tomorrow, will d/c drain tomorrow  Ice and elevate  CPM 2. ABL anemia  Will transfuse 1 unit of PRBC's  Pt only POD 1 with significant drop in h/h from pre-op  Pressures slightly low, o/w currently asymptomatic but I do feel that pt will need transfusion   3. DM  Medicine started sensitive SSI  Resume metformin at d/c 4. HTN  Stable 5. Sleep apnea  Family to bring settings from home 6. GERD  protonix 7. DVT/PE  prophylaxis  Lovenox 8. Pain  Low dose narcotics  Use tylenol as first line agent and narcotics for breakthrough  9. FEN  KVO   CHO modified diet 10. dispo  PT/OT consult  Plan for snf on Monday   Mearl Latin, PA-C [facility] [phone] (P) 05/26/2012, 8:19 AM

## 2012-05-26 NOTE — Op Note (Signed)
NAME:  MADY, OUBRE NO.:  [id number]  MEDICAL RECORD NO.:  [id number]  LOCATION:  5N12C                        FACILITY:  [facility]  PHYSICIAN:  Dyke Brackett, M.D.    DATE OF BIRTH:  16-Apr-1921  DATE OF PROCEDURE:  05/25/2012 DATE OF DISCHARGE:                              OPERATIVE REPORT   PREOPERATIVE DIAGNOSIS:  Osteoarthritis and valgus deformity, right knee.  POSTOPERATIVE DIAGNOSIS:  Osteoarthritis and valgus deformity, right knee.  OPERATION:  Right total knee replacement with Sigma, cemented size 3 femur, tibia, 10 mm bearing, 35 mm all poly patella.  ASSISTANT:  [name], PA-C  ANESTHESIA:  General anesthetic.  TOURNIQUET TIME:  50 minutes.  DESCRIPTION OF PROCEDURE:  Sterile prep and drape, exsanguination of leg, inflation to 350.  We noticed pretty significant valgus deformity of the knee, for that reason cut 10 mm of distal femur despite a mild flexion contracture.  I then identified a very, very dished out lateral compartment relative to bone loss and really flushed with this defect, we could cut really below it, we cut right on it, pretty relatively aggressive cut on the medial side.  Once this was done, menisci were removed and we eventually measured the extension gap at 10 mm, sized the femur to be a size 3.  I then placed the anterior referencing guide, created 2 pin holes for the anterior-posterior chamfer cuts with appropriate external rotation with a 10 degree flexion gap measured. All cuts were accomplished.  PCL was released, excess menisci were removed, and then the flexion gap measured at 10 mm to match the extension gap.  Attention directed to the tibia.  Tibia was cut with the keel for the tibia, size 3.  Again eburnated bone on both the femoral and tibial side was over drilled where we had minimal cuts and relatively hard bone.  Trial tibia was placed.  Box cut was made on the femur.  Trial femur was then placed on to  the knee with a trial tibia 10 mm bearing, full extension, good resolution of the excess valgus.  No instability to varus, valgus, at any degree chest and no tendency for bearing spin out.  Patella was cut for an all poly patella 35 mm.  Trials were removed.  Bony surfaces were irrigated.  We then inserted antibiotic impregnated cement with gentamicin due to the patient's history of diabetes, relatively high risk for postop infection, tibia followed by femur patella.  Cement was allowed to harden.  We elected to use a trial bearing.  Once the cement was hardened, trial bearing was removed.  Excess cement, none was noted in the posterior aspect of the knee.  Tourniquet was released. Again no excess bleeding was noted.  The final bearing was placed and again all parameters rechecked, deemed to be acceptable.  Hemovac drain was placed exiting superolaterally and closure was affected with #1 Ethibond, 0 and 2-0 Vicryl, skin clips, taken to recovery room in stable condition.     Dyke Brackett, M.D.     WDC/MEDQ  D:  05/25/2012  T:  05/25/2012  Job:  [id number]

## 2012-05-26 NOTE — Progress Notes (Signed)
Physical Therapy Treatment Patient Details Name: Cristina Middleton MRN: [id number] DOB: April 28, 1921 Today's Date: 05/26/2012 Time: 1427-1500 PT Time Calculation (min): 33 min  PT Assessment / Plan / Recommendation Comments on Treatment Session  Pt s/p R TKA continues mobility progression and educated again for transfers, HEP and CPM use. Pt in CPM end of session and encouraged to be out and in chair for dinner. Will continue to follow.     Follow Up Recommendations        Does the patient have the potential to tolerate intense rehabilitation     Barriers to Discharge        Equipment Recommendations       Recommendations for Other Services    Frequency     Plan Discharge plan remains appropriate;Frequency remains appropriate    Precautions / Restrictions Precautions Precautions: Knee Required Braces or Orthoses: Knee Immobilizer - Right Knee Immobilizer - Right: On when out of bed or walking Restrictions RLE Weight Bearing: Weight bearing as tolerated   Pertinent Vitals/Pain 6/10 right knee pain, premedicated, repositioned sats 81% on RA and returned to 2L O2, may not be accurate reading given chilly fingers and arthritis    Mobility  Bed Mobility Bed Mobility: Sit to Supine;Scooting to HOB Sit to Supine: 4: Min assist;HOB flat Scooting to HOB: 3: Mod assist Details for Bed Mobility Assistance: cueing for use of belt to assist RLE onto surface with min assist to complete transition fully into bed  Transfers Sit to Stand: 4: Min guard;From chair/3-in-1;With armrests Stand to Sit: 4: Min guard;To bed Details for Transfer Assistance: cueing for hand placement and safety Ambulation/Gait Ambulation/Gait Assistance: 4: Min guard Ambulation Distance (Feet): 80 Feet Assistive device: Rolling walker Ambulation/Gait Assistance Details: cueing for sequence and safety Gait Pattern: Step-to pattern;Decreased stance time - right Gait velocity: decreased Stairs: No    Exercises  Total Joint Exercises Ankle Circles/Pumps: AROM;Right;10 reps;Supine Quad Sets: AROM;Right;10 reps;Supine Heel Slides: AAROM;Right;10 reps;Supine Hip ABduction/ADduction: AROM;Right;10 reps;Supine Straight Leg Raises: AAROM;Right;10 reps;Supine   PT Diagnosis:    PT Problem List:   PT Treatment Interventions:     PT Goals Acute Rehab PT Goals PT Goal: Sit to Supine/Side - Progress: Progressing toward goal PT Goal: Sit to Stand - Progress: Progressing toward goal PT Goal: Stand to Sit - Progress: Progressing toward goal PT Goal: Ambulate - Progress: Progressing toward goal PT Goal: Perform Home Exercise Program - Progress: Progressing toward goal  Visit Information  Last PT Received On: 05/26/12 Assistance Needed: +1    Subjective Data  Subjective: I didn't feel so good standing on it a while ago   Cognition  Overall Cognitive Status: Appears within functional limits for tasks assessed/performed Arousal/Alertness: Awake/alert Orientation Level: Appears intact for tasks assessed Behavior During Session: [facility] for tasks performed    Balance     End of Session PT - End of Session Equipment Utilized During Treatment: Gait belt;Right knee immobilizer Activity Tolerance: Patient tolerated treatment well Patient left: in bed;with nursing in room;with family/visitor present Nurse Communication: Mobility status CPM Right Knee CPM Right Knee: On Right Knee Flexion (Degrees): 60  Right Knee Extension (Degrees): 0    GP     Toney Sang Beth 05/26/2012, 3:04 PM Delaney Meigs, PT [phone]

## 2012-05-26 NOTE — Progress Notes (Addendum)
[facility] PROGRESS NOTE/Medical consultation  Cristina Middleton [facility] WUJ:[id number] DOB: 12-Mar-1921 DOA: 05/25/2012 PCP: Pearson Grippe, MD  Brief narrative 79 year old female with PMH of HTN, DM 2, HL, GERD, OSA, hypothyroid, severe right knee OA that failed conservative management, S/P right TKA on 05/25/2012. Hospitalist service consulted for assistance with evaluation and management of her diabetes and hypertension postoperatively.  Assessment/Plan: 1. Diabetes mellitus type 2: Hemoglobin A1c is 6.5. Patient indicates that her metformin dose was reduced as outpatient from 2 g to 500 mg daily. Home CBGs range between 100-110 mg/dL. Metformin held in hospital. CBGs too tightly controlled (83-110 mg/dL). Change SSI to sensitive scale without HS coverage so as to avoid hypoglycemia. When ready for discharge, DC on prior Metformin. 2. HTN : Controlled/slightly soft. Patient takes Indapamide MWF-not due until 1/27. Clinically euvolemic. Continue same. 3. Acute blood loss anemia/mild thrombocytopenia: Secondary to surgery and blood loss. Agree with transfusion of PRBCs times one unit. Follow CBCs. 4. Hypothyroid: Clinically euthyroid. Continue Synthroid. 5. OSA: Continue nightly CPAP. 6. GERD/moderate hiatal hernia on chest x-ray: Continue PPIs. 7. S/P right TKA: Management per primary service.  The Hospitalist service will sign off at this time. Please contact us for any further assistance. Discussed with primary service.  Family Communication: Discussed with patient. Disposition Plan: Per primary service-planned for SNF when stable.   HPI/Subjective: Denies complaints. No significant pain in right lower extremity  Objective: Filed Vitals:   05/26/12 0358 05/26/12 0547 05/26/12 0800 05/26/12 0937  BP:  107/52    Pulse:  86  104  Temp:  99 F (37.2 C)    TempSrc:      Resp: 18 16 18    Height:      Weight:      SpO2: 94% 96%  94%    Intake/Output Summary (Last 24 hours) at 05/26/12  1043 Last data filed at 05/26/12 0600  Gross per 24 hour  Intake   2150 ml  Output   1315 ml  Net    835 ml   Filed Weights   05/25/12 1930  Weight: 64.774 kg (142 lb 12.8 oz)    Exam:   General exam: Pleasant and comfortable. Eating breakfast.  Respiratory system: Clear to auscultation. No increased work of breathing.  Cardiovascular system: S1 and S2 heard, RRR. No JVD, murmurs or pedal edema.  Gastrointestinal system: Abdomen is nondistended, soft and nontender. Normal bowel sounds heard.  Central nervous system: Alert and oriented. No focal neurological deficits.  Extremities: Right lower extremity dressing clean, dry and intact. Able to wiggle toes. Good peripheral pulses.   Data Reviewed: Basic Metabolic Panel:  Lab 05/26/12 9562 05/25/12 1552  NA 135 --  K 3.6 --  CL 99 --  CO2 27 --  GLUCOSE 104* --  BUN 14 --  CREATININE 0.83 0.85  CALCIUM 8.3* --  MG -- --  PHOS -- --   Liver Function Tests: No results found for this basename: AST:5,ALT:5,ALKPHOS:5,BILITOT:5,PROT:5,ALBUMIN:5 in the last 168 hours No results found for this basename: LIPASE:5,AMYLASE:5 in the last 168 hours No results found for this basename: AMMONIA:5 in the last 168 hours CBC:  Lab 05/26/12 0610 05/25/12 1552  WBC 5.3 7.2  NEUTROABS -- --  HGB 8.3* 10.5*  HCT 25.0* 31.2*  MCV 89.9 89.4  PLT 148* 175   Cardiac Enzymes: No results found for this basename: CKTOTAL:5,CKMB:5,CKMBINDEX:5,TROPONINI:5 in the last 168 hours BNP (last 3 results) No results found for this basename: PROBNP:3 in the last 8760 hours CBG:  Lab 05/26/12 0708 05/25/12 2140 05/25/12 1636 05/25/12 1255 05/25/12 0838  GLUCAP 104* 86 83 93 110*    Recent Results (from the past 240 hour(s))  SURGICAL PCR SCREEN     Status: Normal   Collection Time   05/18/12 11:09 AM      Component Value Range Status Comment   MRSA, PCR NEGATIVE  NEGATIVE Final    Staphylococcus aureus NEGATIVE  NEGATIVE Final       Studies: Dg Chest 2 View  05/25/2012  *RADIOLOGY REPORT*  Clinical Data: Preop total knee replacement.  Hypertension, diabetes.  CHEST - 2 VIEW  Comparison: 11/07/2011  Findings: Moderate sized hiatal hernia, stable.  Heart is normal size.  Minimal left base atelectasis or scar.  Right lung is clear. No effusions or acute bony abnormality.  IMPRESSION: Moderate hiatal hernia.  Left base atelectasis or scar.   Original Report Authenticated By: Charlett Nose, M.D.     Scheduled Meds:    . acetaminophen  650 mg Oral Once  . calcium carbonate  1 tablet Oral Q breakfast  . cholecalciferol  400 Units Oral Daily  . diphenhydrAMINE  25 mg Oral Once  . docusate sodium  100 mg Oral BID  . enoxaparin (LOVENOX) injection  30 mg Subcutaneous Q12H  . escitalopram  20 mg Oral Daily  . furosemide  20 mg Intravenous Once  . indapamide  2.5 mg Oral See admin instructions  . insulin aspart  0-9 Units Subcutaneous TID WC  . levothyroxine  100 mcg Oral Daily  . multivitamin  1 tablet Oral Daily  . pantoprazole  40 mg Oral Daily  . simvastatin  20 mg Oral QPM  . vitamin B-12  500 mcg Oral Daily   Continuous Infusions:    . sodium chloride 10 mL/hr (05/26/12 0831)    Principal Problem:  *Osteoarthritis of right knee Active Problems:  HTN (hypertension)  Diabetes mellitus, type 2  Sleep apnea  GERD (gastroesophageal reflux disease)  Hypothyroidism  Acute blood loss anemia    Time spent: 30 minutes.    [facility]  [facility] Pager [phone]. If 8PM-8AM, please contact night-coverage at [email or web address], password [facility] 05/26/2012, 10:43 AM  LOS: 1 day

## 2012-05-26 NOTE — Evaluation (Signed)
Physical Therapy Evaluation Patient Details Name: Cristina Middleton MRN: [id number] DOB: 09/24/1920 Today's Date: 05/26/2012 Time: 8295-6213 PT Time Calculation (min): 24 min  PT Assessment / Plan / Recommendation Clinical Impression  Pt admitted for R TKA and progressing well with mobility. Pt states she wears depends at baseline and donned these prior to mobility. Pt very pleasant and motivated to return to PLOF. Pt will benefit from acute therapy to maximize strength, ROM, gait and function prior to discharge.     PT Assessment  Patient needs continued PT services    Follow Up Recommendations  SNF    Does the patient have the potential to tolerate intense rehabilitation      Barriers to Discharge Decreased caregiver support      Equipment Recommendations  None recommended by PT    Recommendations for Other Services     Frequency 7X/week    Precautions / Restrictions Precautions Precautions: Knee Required Braces or Orthoses: Knee Immobilizer - Right Knee Immobilizer - Right: On when out of bed or walking Restrictions RLE Weight Bearing: Weight bearing as tolerated   Pertinent Vitals/Pain 3/10 surgical pain, premedicated sats 89% on RA, 94% on 2L with HR 104 with activity      Mobility  Bed Mobility Bed Mobility: Supine to Sit Supine to Sit: 4: Min assist;HOB elevated;With rails (HOB 20degrees) Details for Bed Mobility Assistance: cueing to sequence and assist to fully elevate trunk Transfers Transfers: Sit to Stand;Stand to Sit Sit to Stand: 4: Min assist;From bed Stand to Sit: 4: Min guard;To chair/3-in-1;With armrests Details for Transfer Assistance: cueing for hand placement, sequence and safety with transfers Ambulation/Gait Ambulation/Gait Assistance: 4: Min guard Ambulation Distance (Feet): 60 Feet Assistive device: Rolling walker Ambulation/Gait Assistance Details: pt with cueing for RW use with excellent mobility first time up Gait Pattern: Step-to  pattern;Decreased stance time - right Gait velocity: decreased Stairs: No    Shoulder Instructions     Exercises Total Joint Exercises Ankle Circles/Pumps: AROM;Right;5 reps;Seated Quad Sets: AROM;Right;5 reps;Supine Heel Slides: AAROM;Right;5 reps;Supine Hip ABduction/ADduction: AROM;Right;5 reps;Supine   PT Diagnosis: Difficulty walking;Acute pain  PT Problem List: Decreased strength;Decreased range of motion;Decreased activity tolerance;Decreased mobility;Decreased knowledge of use of DME;Pain PT Treatment Interventions: DME instruction;Gait training;Stair training;Functional mobility training;Therapeutic activities;Therapeutic exercise;Patient/family education   PT Goals Acute Rehab PT Goals PT Goal Formulation: With patient Time For Goal Achievement: 06/02/12 Potential to Achieve Goals: Good Pt will go Supine/Side to Sit: with modified independence;with HOB 0 degrees PT Goal: Supine/Side to Sit - Progress: Goal set today Pt will go Sit to Supine/Side: with modified independence;with HOB 0 degrees PT Goal: Sit to Supine/Side - Progress: Goal set today Pt will go Sit to Stand: with supervision PT Goal: Sit to Stand - Progress: Goal set today Pt will go Stand to Sit: with supervision PT Goal: Stand to Sit - Progress: Goal set today Pt will Ambulate: >150 feet;with supervision;with rolling walker PT Goal: Ambulate - Progress: Goal set today Pt will Perform Home Exercise Program: Independently PT Goal: Perform Home Exercise Program - Progress: Goal set today  Visit Information  Last PT Received On: 05/26/12 Assistance Needed: +1    Subjective Data  Subjective: its better than before surgery Patient Stated Goal: go to [facility] then home   Prior Functioning  Home Living Lives With: Alone Available Help at Discharge: Family;Available PRN/intermittently Type of Home: House Home Access: Stairs to enter [employer] of Steps: 3 Home Layout: One level Bathroom  Shower/Tub: Engineer, manufacturing systems: Standard  Home Adaptive Equipment: Wheelchair - manual;Bedside commode/3-in-1;Walker - rolling Prior Function Level of Independence: Independent with assistive device(s) Able to Take Stairs?: Yes Driving: Yes Vocation: Retired Musician: No difficulties    Cognition  Overall Cognitive Status: Appears within functional limits for tasks assessed/performed Arousal/Alertness: Awake/alert Orientation Level: Appears intact for tasks assessed Behavior During Session: [facility] for tasks performed    Extremity/Trunk Assessment Right Upper Extremity Assessment RUE ROM/Strength/Tone: [facility] for tasks assessed Left Upper Extremity Assessment LUE ROM/Strength/Tone: WFL for tasks assessed Right Lower Extremity Assessment RLE ROM/Strength/Tone: Deficits;Due to pain Left Lower Extremity Assessment LLE ROM/Strength/Tone: WFL for tasks assessed Trunk Assessment Trunk Assessment: Normal   Balance    End of Session PT - End of Session Equipment Utilized During Treatment: Gait belt;Right knee immobilizer Activity Tolerance: Patient tolerated treatment well Patient left: in chair;with call bell/phone within reach CPM Right Knee CPM Right Knee: Off  GP     Toney Sang [facility] 05/26/2012, 9:42 AM  Delaney Meigs, PT [phone]

## 2012-05-26 NOTE — Progress Notes (Signed)
Premeds given prior to PRBCs - Tylenol given in Hydrocodone.

## 2012-05-27 LAB — GLUCOSE, CAPILLARY
Glucose-Capillary: 105 mg/dL — ABNORMAL HIGH (ref 70–99)
Glucose-Capillary: 105 mg/dL — ABNORMAL HIGH (ref 70–99)

## 2012-05-27 LAB — TYPE AND SCREEN: ABO/RH(D): A POS

## 2012-05-27 LAB — CBC
HCT: 30.6 % — ABNORMAL LOW (ref 36.0–46.0)
Hemoglobin: 10.4 g/dL — ABNORMAL LOW (ref 12.0–15.0)
MCH: 30.6 pg (ref 26.0–34.0)
MCHC: 34 g/dL (ref 30.0–36.0)
RBC: 3.4 MIL/uL — ABNORMAL LOW (ref 3.87–5.11)

## 2012-05-27 MED ORDER — INDAPAMIDE 2.5 MG PO TABS
2.5000 mg | ORAL_TABLET | ORAL | Status: DC
  Administered 2012-05-28: 2.5 mg via ORAL
  Filled 2012-05-27 (×2): qty 1

## 2012-05-27 NOTE — Progress Notes (Signed)
Pt has refused CPAP, RT to monitor and assess as needed.

## 2012-05-27 NOTE — Clinical Social Work Psychosocial (Signed)
Clinical Social Work Department BRIEF PSYCHOSOCIAL ASSESSMENT 05/27/2012  Patient:  Cristina Middleton, Cristina Middleton     Account Number:  [id number]     Admit date:  05/25/2012  Clinical Social Worker:  Skip Mayer  Date/Time:  05/27/2012 01:00 PM  Referred by:  Physician  Date Referred:  05/27/2012 Referred for  SNF Placement   Other Referral:   Interview type:  Patient Other interview type:    PSYCHOSOCIAL DATA Living Status:  ALONE Admitted from facility:   Level of care:   Primary support name:  Homero Fellers Primary support relationship to patient:  CHILD, ADULT Degree of support available:   Adequate    CURRENT CONCERNS Current Concerns  Post-Acute Placement   Other Concerns:    SOCIAL WORK ASSESSMENT / PLAN CSW met with pt and pt's son, Homero Fellers, at bedside.  Pt reports pre-reg with [facility].  CSW completed FL2 and sent clinicals to [facility] for review.  Pt reports plan is to transport via [facility].  Weekday CSW to f/u and assist with d/c.  FL2 on chart for MD signature.   Assessment/plan status:  Information/Referral to [facility] Other assessment/ plan:   Information/referral to community resources:   [facility]  SNF    PATIENT'S/FAMILY'S RESPONSE TO PLAN OF CARE: Pt reports agreeable to STR at [facility] in order to increase strength and independence prior to return home.  Pt and pt's son verbalized understanding of d/c plan and appreciation for CSW assist.        Clinical Social Work Department BRIEF PSYCHOSOCIAL ASSESSMENT 05/27/2012  Patient:  Cristina Middleton     Account Number:  [id number]     Admit date:  05/25/2012  Clinical Social Worker:  Skip Mayer  Date/Time:  05/27/2012 01:00 PM  Referred by:  Physician  Date Referred:  05/27/2012 Referred for  SNF Placement   Other Referral:   Interview type:  Patient Other interview type:    PSYCHOSOCIAL DATA Living Status:  ALONE Admitted from facility:   Level of care:   Primary support  name:  Homero Fellers Primary support relationship to patient:  CHILD, ADULT Degree of support available:   Adequate    CURRENT CONCERNS Current Concerns  Post-Acute Placement   Other Concerns:    SOCIAL WORK ASSESSMENT / PLAN CSW met with pt and pt's son, Homero Fellers, at bedside.  Pt reports pre-reg with [facility].  CSW completed FL2 and sent clinicals to [facility] for review.  Pt reports plan is to transport via [facility].  Weekday CSW to f/u and assist with d/c.  FL2 on chart for MD signature.   Assessment/plan status:  Information/Referral to [facility] Other assessment/ plan:   Information/referral to community resources:   [facility]  SNF    PATIENT'S/FAMILY'S RESPONSE TO PLAN OF CARE: Pt reports agreeable to STR at [facility] in order to increase strength and independence prior to return home.  Pt and pt's son verbalized understanding of d/c plan and appreciation for CSW assist.        Dellie Burns, MSW, LCSWA [phone] (Weekends 8:00am-4:30pm)

## 2012-05-27 NOTE — Progress Notes (Signed)
Physical Therapy Treatment Patient Details Name: Cristina Middleton MRN: [id number] DOB: 1921/03/13 Today's Date: 05/27/2012 Time: 4098-1191 PT Time Calculation (min): 18 min  PT Assessment / Plan / Recommendation Comments on Treatment Session  Pt continues to make progress with mobility. Limited gait this pm session due to RN giving pt a suppository and pt wanting to sit down and "let it work" and not be too far from bathroom when it does.    Follow Up Recommendations  SNF           Equipment Recommendations  None recommended by PT       Frequency 7X/week   Plan Discharge plan remains appropriate;Frequency remains appropriate    Precautions / Restrictions Precautions Precautions: Knee Required Braces or Orthoses: Knee Immobilizer - Right Knee Immobilizer - Right: On when out of bed or walking Restrictions RLE Weight Bearing: Weight bearing as tolerated       Mobility  Transfers Sit to Stand: 4: Min guard;From chair/3-in-1;From toilet;With upper extremity assist Stand to Sit: 4: Min guard;To chair/3-in-1;To toilet;With upper extremity assist Details for Transfer Assistance: cues for hand placement for incr safety and for wt shifting to assist with transfers Ambulation/Gait Ambulation/Gait Assistance: 4: Min guard Ambulation Distance (Feet): 16 Feet (45ft x2) Assistive device: Rolling walker Ambulation/Gait Assistance Details: min vc's/tc's for posture, walker position with gait and sequence with gait Gait Pattern: Step-to pattern;Decreased stance time - right;Decreased step length - left;Antalgic;Trunk flexed    Exercises Total Joint Exercises Ankle Circles/Pumps: AROM;Both;10 reps;Supine Quad Sets: AROM;Strengthening;Right;10 reps;Supine Heel Slides: AAROM;Strengthening;Right;10 reps;Supine Hip ABduction/ADduction: AROM;Strengthening;Right;10 reps;Supine Straight Leg Raises: AAROM;Strengthening;Right;10 reps;Supine Straight Leg Raises Limitations: pt active with last 3  reps of the 10    PT Goals Acute Rehab PT Goals PT Goal: Sit to Stand - Progress: Progressing toward goal PT Goal: Stand to Sit - Progress: Progressing toward goal PT Goal: Ambulate - Progress: Progressing toward goal PT Goal: Perform Home Exercise Program - Progress: Progressing toward goal  Visit Information  Last PT Received On: 05/27/12 Assistance Needed: +1    Subjective Data  Subjective: No new complaints, requesting to go to bathroom .   Cognition  Overall Cognitive Status: Appears within functional limits for tasks assessed/performed Arousal/Alertness: Awake/alert Orientation Level: Appears intact for tasks assessed Behavior During Session: [facility] for tasks performed       End of Session PT - End of Session Equipment Utilized During Treatment: Gait belt;Right knee immobilizer Activity Tolerance: Patient tolerated treatment well Patient left: in chair;with call bell/phone within reach;with family/visitor present Nurse Communication: Mobility status;Patient requests pain meds   GP     Sallyanne Kuster 05/27/2012, 3:16 PM  Sallyanne Kuster, PTA Office- [phone]

## 2012-05-27 NOTE — Clinical Social Work Placement (Addendum)
Clinical Social Work Department CLINICAL SOCIAL WORK PLACEMENT NOTE 05/27/2012  Patient:  Cristina Middleton, Cristina Middleton  Account Number:  [id number] Admit date:  05/25/2012  Clinical Social Worker:  Dellie Burns, Theresia Majors  Date/time:  05/27/2012 01:00 PM  Clinical Social Work is seeking post-discharge placement for this patient at the following level of care:   SKILLED NURSING   (*CSW will update this form in Epic as items are completed)   05/27/2012  Patient/family provided with [facility] Department of Clinical Social Work's list of facilities offering this level of care within the geographic area requested by the patient (or if unable, by the patient's family).  05/27/2012  Patient/family informed of their freedom to choose among providers that offer the needed level of care, that participate in Medicare, Medicaid or managed care program needed by the patient, have an available bed and are willing to accept the patient.  05/27/2012  Patient/family informed of [facility]' ownership interest in [facility], as well as of the fact that they are under no obligation to receive care at this facility.  PASARR submitted to EDS on 05/27/2012 PASARR number received from EDS on 05/27/2012  FL2 transmitted to all facilities in geographic area requested by pt/family on  05/27/2012 FL2 transmitted to all facilities within larger geographic area on   Patient informed that his/her managed care company has contracts with or will negotiate with  certain facilities, including the following:     Patient/family informed of bed offers received:   Patient chooses bed at [facility] [place] Physician recommends and patient chooses bed at    Patient to be transferred to  on  05/29/12 Patient to be transferred to facility by [facility]  The following physician request were entered in Epic:   Additional Comments: Faxed to [facility] only as pt pre-reg with this SNF.  SW signing  off. All needs addressed Sabino Niemann, MSW, [phone]

## 2012-05-27 NOTE — Progress Notes (Signed)
Orthopaedic Trauma [facility] (OTS)  Subjective: 2 Days Post-Op Procedure(s) (LRB): TOTAL KNEE ARTHROPLASTY (Right)    Doing well No acute changes Tolerated 1 unit PRBC's yesterday Tolerating diet Voided this am without too much difficulty   Objective: Current Vitals Blood pressure 123/55, pulse 93, temperature 98.7 F (37.1 C), temperature source Oral, resp. rate 18, height 5\' 3"  (1.6 m), weight 64.774 kg (142 lb 12.8 oz), SpO2 96.00%. Vital signs in last 24 hours: Temp:  [98.6 F (37 C)-100 F (37.8 C)] 98.7 F (37.1 C) (01/26 0625) Pulse Rate:  [78-104] 93  (01/26 0625) Resp:  [16-18] 18  (01/26 0625) BP: (101-123)/(38-55) 123/55 mmHg (01/26 0625) SpO2:  [81 %-98 %] 96 % (01/26 0625)  Intake/Output from previous day: 01/25 0701 - 01/26 0700 In: 1320 [P.O.:720; I.V.:250; Blood:350] Out: 1100 [Urine:950; Drains:150] Intake/Output      01/25 0701 - 01/26 0700 01/26 0701 - 01/27 0700   P.O. 720    I.V. (mL/kg) 250 (3.9)    Blood 350    Total Intake(mL/kg) 1320 (20.4)    Urine (mL/kg/hr) 950 (0.6)    Drains 150    Blood     Total Output 1100    Net +220         Urine Occurrence 3 x       LABS  Basename 05/27/12 0500 05/26/12 1743 05/26/12 0610 05/25/12 1552  HGB 10.4* 10.0* 8.3* 10.5*    Basename 05/27/12 0500 05/26/12 1743  WBC 8.4 6.9  RBC 3.40* 3.24*  HCT 30.6* 29.0*  PLT 150 134*    Basename 05/26/12 0610 05/25/12 1552  NA 135 --  K 3.6 --  CL 99 --  CO2 27 --  BUN 14 --  CREATININE 0.83 0.85  GLUCOSE 104* --  CALCIUM 8.3* --   No results found for this basename: LABPT:2,INR:2 in the last 72 hours   Physical Exam  Gen: Awake and alert, NAD, appears comfortable Lungs: clear Cardiac:s1 and s2 Abd:+ BS, NT Ext:      Right Lower Extremity  Incision looks fantastic  C/d, no signs of infection   Ext warm  + DP pulse  Distal motor and sensory functions intact  Compartments soft, NT    Imaging No results  found.  Assessment/Plan: 2 Days Post-Op Procedure(s) (LRB): TOTAL KNEE ARTHROPLASTY (Right)  79 y/o female s/p R TKA  1. R TKA POD 2             WBAT             PT/OT today             Dressing changed  Dressing changes PRN  Pt may shower with assistance             Ice and elevate             CPM 2. ABL anemia             Improved after 1 unit             No symptoms  Monitor  3. DM             sensitive SSI             Resume metformin at d/c 4. HTN             Stable 5. Sleep apnea             Family to bring settings from home  Pt refused CPAP last  night 6. GERD             protonix 7. DVT/PE prophylaxis             Lovenox 8. Pain             Low dose narcotics             Use tylenol as first line agent and narcotics for breakthrough   9. FEN             KVO                 CHO modified diet  Foley d/c'd yesterday 10. dispo             PT/OT              Plan for snf tomorrow  Mearl Latin, PA-C [facility] [phone] (P) 05/27/2012, 9:24 AM

## 2012-05-27 NOTE — Progress Notes (Signed)
Physical Therapy Treatment Patient Details Name: Cristina Middleton MRN: [id number] DOB: 20-Nov-1920 Today's Date: 05/27/2012 Time: 1191-4782 PT Time Calculation (min): 32 min  PT Assessment / Plan / Recommendation Comments on Treatment Session  Pt making steady progress with mobility and strenghening of her right knee. Progressing toward her goals.     Follow Up Recommendations  SNF           Equipment Recommendations  None recommended by PT       Frequency 7X/week   Plan Discharge plan remains appropriate;Frequency remains appropriate    Precautions / Restrictions Precautions Precautions: Knee Required Braces or Orthoses: Knee Immobilizer - Right Knee Immobilizer - Right: On when out of bed or walking Restrictions RLE Weight Bearing: Weight bearing as tolerated       Mobility  Bed Mobility Bed Mobility: Sitting - Scoot to Edge of Bed Supine to Sit: 4: Min assist;With rails;HOB elevated (HOB 30 degrees) Scooting to HOB: 5: Supervision Details for Bed Mobility Assistance: cues for use of arms to assist with trunk elevation and for sequence of mobility to get to edge of bed. incr time needed for maximal pt effort. Transfers Sit to Stand: 4: Min guard;From bed;From toilet;With upper extremity assist Stand to Sit: 4: Min guard;To chair/3-in-1;To toilet;With upper extremity assist Details for Transfer Assistance: cues for hand placment for safety with transfers Ambulation/Gait Ambulation/Gait Assistance: 4: Min guard Ambulation Distance (Feet): 80 Feet Assistive device: Rolling walker Ambulation/Gait Assistance Details: min cues for posture, sequence of gait, to decr right hip ER with gait and for incr BOS with gait. Gait Pattern: Step-to pattern;Decreased stance time - right;Decreased step length - right;Decreased step length - left;Trunk flexed;Antalgic;Narrow base of support;Decreased weight shift to right Gait velocity: Decreased    Exercises Total Joint Exercises Ankle  Circles/Pumps: AROM;Both;10 reps;Supine Quad Sets: AROM;Strengthening;10 reps;Both;Supine Short Arc Quad: AROM;Strengthening;Right;10 reps;Supine Heel Slides: AAROM;Strengthening;Right;10 reps;Supine Hip ABduction/ADduction: AROM;Strengthening;Right;10 reps;Supine Straight Leg Raises: AAROM;Strengthening;AROM;Right;10 reps;Supine;Limitations Straight Leg Raises Limitations: x5 reps AA, X5 reps active    PT Goals Acute Rehab PT Goals PT Goal: Supine/Side to Sit - Progress: Progressing toward goal PT Goal: Sit to Stand - Progress: Progressing toward goal PT Goal: Stand to Sit - Progress: Progressing toward goal PT Goal: Ambulate - Progress: Progressing toward goal PT Goal: Perform Home Exercise Program - Progress: Progressing toward goal  Visit Information  Last PT Received On: 05/27/12 Assistance Needed: +1    Subjective Data  Subjective: "I feel better today, last night was rough". Agreeable to therapy and out of bed at this time.   Cognition  Overall Cognitive Status: Appears within functional limits for tasks assessed/performed Arousal/Alertness: Awake/alert Orientation Level: Appears intact for tasks assessed Behavior During Session: [facility] for tasks performed       End of Session PT - End of Session Equipment Utilized During Treatment: Gait belt;Right knee immobilizer Activity Tolerance: Patient tolerated treatment well Patient left: in chair;with call bell/phone within reach;with family/visitor present Nurse Communication: Mobility status;Patient requests pain meds   GP     Sallyanne Kuster 05/27/2012, 12:07 PM  Sallyanne Kuster, PTA Office- [phone]

## 2012-05-27 NOTE — Progress Notes (Signed)
Pt refuses CPAP, RT to monitor and assess as needed.  

## 2012-05-27 NOTE — Progress Notes (Signed)
Occupational Therapy Note  OT order received and appreciated. Per PT report and chart review (PA note), pt planning to d/c to SNF tomorrow. Will defer OT eval to next venue of care.  Thanks.  05/27/2012 Cipriano Mile OTR/L Pager [phone] Office [phone]

## 2012-05-28 ENCOUNTER — Encounter (HOSPITAL_COMMUNITY): Payer: Self-pay | Admitting: Orthopedic Surgery

## 2012-05-28 LAB — URINALYSIS, ROUTINE W REFLEX MICROSCOPIC
Bilirubin Urine: NEGATIVE
Glucose, UA: NEGATIVE mg/dL
Hgb urine dipstick: NEGATIVE
Specific Gravity, Urine: 1.01 (ref 1.005–1.030)
pH: 6.5 (ref 5.0–8.0)

## 2012-05-28 LAB — CBC
MCV: 88.2 fL (ref 78.0–100.0)
Platelets: 131 10*3/uL — ABNORMAL LOW (ref 150–400)
RDW: 15.9 % — ABNORMAL HIGH (ref 11.5–15.5)
WBC: 6.7 10*3/uL (ref 4.0–10.5)

## 2012-05-28 LAB — GLUCOSE, CAPILLARY: Glucose-Capillary: 114 mg/dL — ABNORMAL HIGH (ref 70–99)

## 2012-05-28 MED ORDER — HYDROCODONE-ACETAMINOPHEN 5-325 MG PO TABS: 1.0000 | ORAL_TABLET | ORAL | Status: DC | PRN

## 2012-05-28 MED ORDER — ENOXAPARIN SODIUM 30 MG/0.3ML ~~LOC~~ SOLN: 30.0000 mg | Freq: Two times a day (BID) | SUBCUTANEOUS | Status: DC

## 2012-05-28 MED ORDER — ACETAMINOPHEN 325 MG PO TABS: 650.0000 mg | ORAL_TABLET | Freq: Four times a day (QID) | ORAL | Status: DC | PRN

## 2012-05-28 MED ORDER — ENOXAPARIN SODIUM 40 MG/0.4ML ~~LOC~~ SOLN
40.0000 mg | SUBCUTANEOUS | Status: DC
  Administered 2012-05-29: 40 mg via SUBCUTANEOUS
  Filled 2012-05-28 (×2): qty 0.4

## 2012-05-28 MED ORDER — BISACODYL 10 MG RE SUPP: 10.0000 mg | Freq: Every day | RECTAL | Status: DC | PRN

## 2012-05-28 MED ORDER — DSS 100 MG PO CAPS: 100.0000 mg | ORAL_CAPSULE | Freq: Two times a day (BID) | ORAL | Status: DC

## 2012-05-28 NOTE — Progress Notes (Deleted)
Physical Therapy Treatment Patient Details Name: Cristina Middleton MRN: [id number] DOB: 07-Oct-1920 Today's Date: 05/28/2012 Time: 3244-0102 PT Time Calculation (min): 34 min  PT Assessment / Plan / Recommendation Comments on Treatment Session  Pt motivated to participate and return to PLOF. Continues to progress with mobility and ambulation. One c/o of mild lightheadness upon standing but subsiding before taking a step.     Follow Up Recommendations        Does the patient have the potential to tolerate intense rehabilitation     Barriers to Discharge        Equipment Recommendations  None recommended by PT    Recommendations for Other Services    Frequency 7X/week   Plan Discharge plan remains appropriate;Frequency remains appropriate    Precautions / Restrictions Precautions Precautions: Knee Required Braces or Orthoses: Knee Immobilizer - Right Knee Immobilizer - Right: On when out of bed or walking Restrictions RLE Weight Bearing: Weight bearing as tolerated       Mobility  Bed Mobility Supine to Sit: 4: Min assist;With rails;HOB elevated Sitting - Scoot to Edge of Bed: 4: Min assist Sit to Supine: 4: Min assist Scooting to [facility]: 5: Supervision;With trapeze Details for Bed Mobility Assistance: pt able to incorpate cueing from last treatment and use arms to assist. Only verbal cueing for safety and energy efficient techniques. Transfers Sit to Stand: 4: Min guard;From chair/3-in-1;From bed Stand to Sit: To chair/3-in-1;To bed;4: Min guard Details for Transfer Assistance: cueing for positioning for stand to sit transitions Ambulation/Gait Ambulation/Gait Assistance: 4: Min guard Ambulation Distance (Feet): 80 Feet Assistive device: Rolling walker Ambulation/Gait Assistance Details:  verbal cues to keep toes pointed forward and to relax at the shoulders Gait Pattern: Step-to pattern;Decreased step length - left;Decreased stance time - right;Trunk flexed Gait velocity:  decreased    Exercises Total Joint Exercises Quad Sets: AROM;10 reps;Both Short Arc Quad: AROM;Right;10 reps Heel Slides: AROM;Right;10 reps Hip ABduction/ADduction: AROM;Right;10 reps Straight Leg Raises: Right;10 reps;AROM   PT Diagnosis:    PT Problem List:   PT Treatment Interventions:     PT Goals Acute Rehab PT Goals PT Goal: Supine/Side to Sit - Progress: Progressing toward goal PT Goal: Sit to Supine/Side - Progress: Progressing toward goal PT Goal: Sit to Stand - Progress: Progressing toward goal PT Goal: Stand to Sit - Progress: Progressing toward goal PT Goal: Ambulate - Progress: Progressing toward goal PT Goal: Perform Home Exercise Program - Progress: Progressing toward goal  Visit Information  Last PT Received On: 05/28/12 Assistance Needed: +1    Subjective Data      Cognition  Overall Cognitive Status: Appears within functional limits for tasks assessed/performed Arousal/Alertness: Awake/alert Orientation Level: Appears intact for tasks assessed Behavior During Session: [facility] for tasks performed    Balance     End of Session PT - End of Session Equipment Utilized During Treatment: Gait belt;Right knee immobilizer Activity Tolerance: Patient tolerated treatment well Patient left: in bed;with call bell/phone within reach;with family/visitor present CPM Right Knee CPM Right Knee: On Right Knee Flexion (Degrees): 60  Right Knee Extension (Degrees): 0    GP     Lazaro Arms 05/28/2012, 12:26 PM

## 2012-05-28 NOTE — Progress Notes (Signed)
Utilization review completed. Arrington Yohe, RN, BSN. 

## 2012-05-28 NOTE — Progress Notes (Signed)
Seen and agree Patient possibly discharging to SNF today. 05/28/2012 Fredrich Birks PTA [phone] pager [phone] office

## 2012-05-28 NOTE — Progress Notes (Signed)
Subjective: 3 Days Post-Op Procedure(s) (LRB): TOTAL KNEE ARTHROPLASTY (Right) Patient reports pain as mild and moderate.  Feeling "sleepy".  Denies dizziness, SOB, CP.  Objective: Vital signs in last 24 hours: Temp:  [98.4 F (36.9 C)-99.3 F (37.4 C)] 98.4 F (36.9 C) (01/27 0551) Pulse Rate:  [95-105] 95  (01/27 0551) Resp:  [16-18] 18  (01/27 0551) BP: (100-112)/(47-65) 112/51 mmHg (01/27 0551) SpO2:  [92 %-96 %] 96 % (01/27 0551)  Intake/Output from previous day: 01/26 0701 - 01/27 0700 In: 800 [P.O.:800] Out: -  Intake/Output this shift:     Basename 05/28/12 0545 05/27/12 0500 05/26/12 1743 05/26/12 0610 05/25/12 1552  HGB 8.8* 10.4* 10.0* 8.3* 10.5*    Basename 05/28/12 0545 05/27/12 0500  WBC 6.7 8.4  RBC 2.89* 3.40*  HCT 25.5* 30.6*  PLT 131* 150    Basename 05/26/12 0610 05/25/12 1552  NA 135 --  K 3.6 --  CL 99 --  CO2 27 --  BUN 14 --  CREATININE 0.83 0.85  GLUCOSE 104* --  CALCIUM 8.3* --   No results found for this basename: LABPT:2,INR:2 in the last 72 hours  Neurovascular intact Sensation intact distally Intact pulses distally Dorsiflexion/Plantar flexion intact Incision: dressing C/D/I No cellulitis present Compartment soft  Assessment/Plan: 3 Days Post-Op Procedure(s) (LRB): TOTAL KNEE ARTHROPLASTY (Right) Up with therapy, CPM, pain control, PT/OT Acute blood loss anemia- drop in Hgb again today, discussed with Dr. Madelon Lips, will hold lovenox today plan on readjusting dose to 40qd starting tomorrow, if Hgb stable tomorrow will plan on d/c to SNF as planned  Cristina Middleton 05/28/2012, 9:10 AM

## 2012-05-28 NOTE — Progress Notes (Signed)
CARE MANAGEMENT NOTE 05/28/2012  Patient:  Cristina Middleton, BODNER   Account Number:  [id number]  Date Initiated:  05/28/2012  Documentation initiated by:  Vance Peper  Subjective/Objective Assessment:   79 yr old female s/p right total knee arthroplasty.     Action/Plan:   Patient discharging to [facility] on Tuesday. Social Worker Amy Stuckey ia aware.   Anticipated DC Date:  05/29/2012   Anticipated DC Plan:  SKILLED NURSING FACILITY  In-house referral  Clinical Social Worker      DC Planning Services  CM consult      Choice offered to / List presented to:             Status of service:  Completed, signed off Medicare Important Message given?   (If response is "NO", the following Medicare IM given date fields will be blank) Date Medicare IM given:   Date Additional Medicare IM given:    Discharge Disposition:  SKILLED NURSING FACILITY  Per UR Regulation:    If discussed at Long Length of Stay Meetings, dates discussed:    Comments:

## 2012-05-29 DIAGNOSIS — M1990 Unspecified osteoarthritis, unspecified site: Secondary | ICD-10-CM | POA: Diagnosis not present

## 2012-05-29 DIAGNOSIS — D649 Anemia, unspecified: Secondary | ICD-10-CM | POA: Diagnosis not present

## 2012-05-29 DIAGNOSIS — I10 Essential (primary) hypertension: Secondary | ICD-10-CM | POA: Diagnosis not present

## 2012-05-29 DIAGNOSIS — G4730 Sleep apnea, unspecified: Secondary | ICD-10-CM | POA: Diagnosis not present

## 2012-05-29 DIAGNOSIS — Z5189 Encounter for other specified aftercare: Secondary | ICD-10-CM | POA: Diagnosis not present

## 2012-05-29 DIAGNOSIS — Z471 Aftercare following joint replacement surgery: Secondary | ICD-10-CM | POA: Diagnosis not present

## 2012-05-29 DIAGNOSIS — K219 Gastro-esophageal reflux disease without esophagitis: Secondary | ICD-10-CM | POA: Diagnosis not present

## 2012-05-29 DIAGNOSIS — E0590 Thyrotoxicosis, unspecified without thyrotoxic crisis or storm: Secondary | ICD-10-CM | POA: Diagnosis not present

## 2012-05-29 DIAGNOSIS — M1710 Unilateral primary osteoarthritis, unspecified knee: Secondary | ICD-10-CM | POA: Diagnosis not present

## 2012-05-29 DIAGNOSIS — F411 Generalized anxiety disorder: Secondary | ICD-10-CM | POA: Diagnosis not present

## 2012-05-29 DIAGNOSIS — E119 Type 2 diabetes mellitus without complications: Secondary | ICD-10-CM | POA: Diagnosis not present

## 2012-05-29 DIAGNOSIS — E039 Hypothyroidism, unspecified: Secondary | ICD-10-CM | POA: Diagnosis not present

## 2012-05-29 DIAGNOSIS — M25569 Pain in unspecified knee: Secondary | ICD-10-CM | POA: Diagnosis not present

## 2012-05-29 DIAGNOSIS — Z96659 Presence of unspecified artificial knee joint: Secondary | ICD-10-CM | POA: Diagnosis not present

## 2012-05-29 LAB — CBC
HCT: 24.3 % — ABNORMAL LOW (ref 36.0–46.0)
MCV: 88.4 fL (ref 78.0–100.0)
RBC: 2.75 MIL/uL — ABNORMAL LOW (ref 3.87–5.11)
RDW: 15.7 % — ABNORMAL HIGH (ref 11.5–15.5)
WBC: 5.7 10*3/uL (ref 4.0–10.5)

## 2012-05-29 LAB — GLUCOSE, CAPILLARY
Glucose-Capillary: 102 mg/dL — ABNORMAL HIGH (ref 70–99)
Glucose-Capillary: 110 mg/dL — ABNORMAL HIGH (ref 70–99)

## 2012-05-29 MED ORDER — ENOXAPARIN SODIUM 40 MG/0.4ML ~~LOC~~ SOLN: 40.0000 mg | SUBCUTANEOUS | Status: DC

## 2012-05-29 NOTE — Progress Notes (Signed)
Seen and agreed 05/29/2012 Robinette, Terrea Elizabeth PTA [phone] pager [phone] office    

## 2012-05-29 NOTE — Progress Notes (Signed)
Pt discharged to [facility]. Report called to RN receiving pt at SNF. Pts IV was removed. Pt left unit in a stable condition with EMS.

## 2012-05-29 NOTE — Progress Notes (Signed)
Pt refuses to wear CPAP at this time. Rn Aware. RT to monitor and assess as needed

## 2012-05-29 NOTE — Progress Notes (Signed)
Physical Therapy Treatment Patient Details Name: Cristina Middleton MRN: [id number] DOB: 09-22-1920 Today's Date: 05/29/2012 Time: 4401-0272 PT Time Calculation (min): 25 min  PT Assessment / Plan / Recommendation Comments on Treatment Session  Pt slowly progressing with ambulation with increase distance requiring encouragement from grandson to continue. She is becoming more independent with exercises as she reports completing exercises (ankle pumps and heel slides) prior to therapy. Will continue to need skilled care for safety and proper techniques.    Follow Up Recommendations  SNF     Does the patient have the potential to tolerate intense rehabilitation     Barriers to Discharge        Equipment Recommendations  None recommended by PT    Recommendations for Other Services    Frequency 7X/week   Plan Discharge plan remains appropriate;Frequency remains appropriate    Precautions / Restrictions Precautions Precautions: Knee Required Braces or Orthoses: Knee Immobilizer - Right Restrictions RLE Weight Bearing: Weight bearing as tolerated       Mobility  Transfers Sit to Stand: 4: Min guard;From chair/3-in-1;With upper extremity assist Stand to Sit: 4: Min assist;With upper extremity assist;To chair/3-in-1 Details for Transfer Assistance: Verbal cueing for RW management, hand placement and controlling descent for stand to sit transition. Ambulation/Gait Ambulation/Gait Assistance: 4: Min guard Ambulation Distance (Feet): 85 Feet Assistive device: Rolling walker Ambulation/Gait Assistance Details: Cueing for safety . pt able to progres to step through with min cueing Gait Pattern: Step-to pattern;Step-through pattern;Trunk flexed Gait velocity: decreased    Exercises Total Joint Exercises Quad Sets: AROM;15 reps;Right Short Arc Quad: AROM;10 reps;Right Heel Slides: AROM;Right;10 reps Straight Leg Raises: AROM;Right;10 reps   PT Diagnosis:    PT Problem List:   PT  Treatment Interventions:     PT Goals Acute Rehab PT Goals PT Goal: Sit to Stand - Progress: Progressing toward goal PT Goal: Stand to Sit - Progress: Progressing toward goal PT Goal: Ambulate - Progress: Progressing toward goal PT Goal: Perform Home Exercise Program - Progress: Progressing toward goal  Visit Information  Last PT Received On: 05/29/12 Assistance Needed: +1    Subjective Data      Cognition  Overall Cognitive Status: Appears within functional limits for tasks assessed/performed Arousal/Alertness: Awake/alert Orientation Level: Appears intact for tasks assessed Behavior During Session: [facility] for tasks performed    Balance     End of Session PT - End of Session Equipment Utilized During Treatment: Gait belt;Right knee immobilizer Activity Tolerance: Patient tolerated treatment well Patient left: in chair;with call bell/phone within reach;with family/visitor present Nurse Communication: Mobility status   GP     Lazaro Arms 05/29/2012, 8:33 AM

## 2012-05-29 NOTE — Discharge Summary (Signed)
PATIENT ID: Cristina Middleton        MRN:  [id number]          DOB/AGE: 1920/07/13 / 79 y.o.    DISCHARGE SUMMARY  ADMISSION DATE:    05/25/2012 DISCHARGE DATE:   05/29/2012   ADMISSION DIAGNOSIS: OA R knee    DISCHARGE DIAGNOSIS:  Osteoarthritis right knee    ADDITIONAL DIAGNOSIS: Principal Problem:  *Osteoarthritis of right knee Active Problems:  HTN (hypertension)  Diabetes mellitus, type 2  Sleep apnea  GERD (gastroesophageal reflux disease)  Hypothyroidism  Acute blood loss anemia  Past Medical History  Diagnosis Date  . PONV (postoperative nausea and vomiting)     YRS AGO.....(6-7 YRS)  . GERD (gastroesophageal reflux disease)     TAKES  ACIPHEX  . Diabetes mellitus without complication   . Arthritis   . Sleep apnea     WEARS MASK EVERY NOW AND THEN  . Hypertension   . Hyperthyroidism     NOT SURE WHICH ONE  . Anxiety   . Dislocated shoulder     WAS POPPED BACK IN PLACE  2006???    PROCEDURE: Procedure(s): TOTAL KNEE ARTHROPLASTY  Right  on 05/25/2012  CONSULTS:     HISTORY: see admission H&P   HOSPITAL COURSE:  Cristina Middleton is a 79 y.o. admitted on 05/25/2012 and found to have a diagnosis of Osteoarthritis right knee.  After appropriate laboratory studies were obtained  they were taken to the operating room on 05/25/2012 and underwent  Right Procedure(s): TOTAL KNEE ARTHROPLASTY.   They were given perioperative antibiotics:  Anti-infectives     Start     Dose/Rate Route Frequency Ordered Stop   05/25/12 1600   clindamycin (CLEOCIN) IVPB 600 mg        600 mg 100 mL/hr over 30 Minutes Intravenous Every 6 hours 05/25/12 1546 05/25/12 2234   05/25/12 0600   clindamycin (CLEOCIN) IVPB 900 mg        900 mg 100 mL/hr over 30 Minutes Intravenous On call to O.R. 05/24/12 2144 05/25/12 1042        .  Tolerated the procedure well.  Placed with a foley intraoperatively.  Given Ofirmev at induction and for 24 hours.    POD #1, allowed out of bed to a  chair.  PT for ambulation and exercise program.  Foley D/C'd in morning.  IV saline locked.  O2 discontionued.  Hgb at 8.4 pt symptomatic transfused 1 unit PRBC with improvement.  POD #2, continued PT and ambulation.   Hemovac pulled. Dressing changed.  POD#3, again drop in Hgb from 10.4 to 8.8 planned on admission one more day repeat cbc on day 4 if stable d/c to snf.    POD#4 stable drop in Hgb d/c SNF.  The remainder of the hospital course was dedicated to ambulation and strengthening.   The patient was discharged on 4 Days Post-Op in  Stable condition.  Blood products given:  1 unit PRBC  DIAGNOSTIC STUDIES: Recent vital signs: Patient Vitals for the past 24 hrs:  BP Temp Pulse Resp SpO2  05/29/12 0719 120/88 mmHg 98.7 F (37.1 C) 95  18  99 %  06-14-12 2200 112/41 mmHg 98.2 F (36.8 C) 97  18  99 %  2012-06-14 1332 102/48 mmHg 98.9 F (37.2 C) 88  16  92 %       Recent laboratory studies:  Basename 05/29/12 0540 Jun 14, 2012 0545 05/27/12 0500 05/26/12 1743 05/26/12 0610 05/25/12 1552  WBC 5.7 6.7 8.4 6.9 5.3 7.2  HGB 8.4* 8.8* 10.4* 10.0* 8.3* 10.5*  HCT 24.3* 25.5* 30.6* 29.0* 25.0* 31.2*  PLT 147* 131* 150 134* 148* 175    Basename 05/26/12 0610 05/25/12 1552  NA 135 --  K 3.6 --  CL 99 --  CO2 27 --  BUN 14 --  CREATININE 0.83 0.85  GLUCOSE 104* --  CALCIUM 8.3* --   Lab Results  Component Value Date   INR 0.93 05/18/2012     Recent Radiographic Studies :  Dg Chest 2 View  05/25/2012  *RADIOLOGY REPORT*  Clinical Data: Preop total knee replacement.  Hypertension, diabetes.  CHEST - 2 VIEW  Comparison: 11/07/2011  Findings: Moderate sized hiatal hernia, stable.  Heart is normal size.  Minimal left base atelectasis or scar.  Right lung is clear. No effusions or acute bony abnormality.  IMPRESSION: Moderate hiatal hernia.  Left base atelectasis or scar.   Original Report Authenticated By: Charlett Nose, M.D.     DISCHARGE INSTRUCTIONS:   DISCHARGE MEDICATIONS:       Medication List     As of 05/29/2012  7:44 AM    STOP taking these medications         aspirin EC 81 MG tablet      TAKE these medications         acetaminophen 325 MG tablet   Commonly known as: TYLENOL   Take 2 tablets (650 mg total) by mouth every 6 (six) hours as needed (or Fever >/= 101).      bisacodyl 10 MG suppository   Commonly known as: DULCOLAX   Place 1 suppository (10 mg total) rectally daily as needed.      calcium carbonate 600 MG Tabs   Commonly known as: OS-CAL   Take 600 mg by mouth daily.      cholecalciferol 400 UNITS Tabs   Commonly known as: VITAMIN D   Take 400 Units by mouth daily.      CINNAMON PO   Take 2 capsules by mouth daily.      DSS 100 MG Caps   Take 100 mg by mouth 2 (two) times daily.      enoxaparin 40 MG/0.4ML injection   Commonly known as: LOVENOX   Inject 0.4 mLs (40 mg total) into the skin daily.      escitalopram 20 MG tablet   Commonly known as: LEXAPRO   Take 20 mg by mouth daily.      HYDROcodone-acetaminophen 5-325 MG per tablet   Commonly known as: NORCO/VICODIN   Take 1-2 tablets by mouth every 4 (four) hours as needed (breakthrough pain).      indapamide 2.5 MG tablet   Commonly known as: LOZOL   Take 2.5 mg by mouth See admin instructions. 3 days a week (Monday, Wednesday, Friday)      levothyroxine 100 MCG tablet   Commonly known as: SYNTHROID, LEVOTHROID   Take 100 mcg by mouth daily.      meloxicam 15 MG tablet   Commonly known as: MOBIC   Take 15 mg by mouth daily.      metFORMIN 500 MG tablet   Commonly known as: GLUCOPHAGE   Take 500 mg by mouth once.      multivitamin-lutein Caps   Take 1 capsule by mouth daily.      RABEprazole 20 MG tablet   Commonly known as: ACIPHEX   Take 20 mg by mouth daily.  simvastatin 20 MG tablet   Commonly known as: ZOCOR   Take 20 mg by mouth every evening.      vitamin B-12 500 MCG tablet   Commonly known as: CYANOCOBALAMIN   Take 500 mcg by mouth  daily.        FOLLOW UP VISIT:       Follow-up Information    Schedule an appointment as soon as possible for a visit with CAFFREY JR,W D, MD. (to be seen on 06/07/12)    Contact information:   [address] [place] [address] [phone]          DISPOSITION:  Skilled Nursing Facility/Rehab    Repeat CBC on Thurs of this week   CONDITION:  {Stable  Cristina Middleton, Ivin Booty 05/29/2012, 7:44 AM

## 2012-05-31 LAB — URINE CULTURE: Colony Count: 25000

## 2012-06-03 DIAGNOSIS — Z96659 Presence of unspecified artificial knee joint: Secondary | ICD-10-CM | POA: Diagnosis not present

## 2012-06-03 DIAGNOSIS — E119 Type 2 diabetes mellitus without complications: Secondary | ICD-10-CM | POA: Diagnosis not present

## 2012-06-03 DIAGNOSIS — I10 Essential (primary) hypertension: Secondary | ICD-10-CM | POA: Diagnosis not present

## 2012-06-19 DIAGNOSIS — R262 Difficulty in walking, not elsewhere classified: Secondary | ICD-10-CM | POA: Diagnosis not present

## 2012-06-19 DIAGNOSIS — Z471 Aftercare following joint replacement surgery: Secondary | ICD-10-CM | POA: Diagnosis not present

## 2012-06-19 DIAGNOSIS — Z96659 Presence of unspecified artificial knee joint: Secondary | ICD-10-CM | POA: Diagnosis not present

## 2012-06-19 DIAGNOSIS — I10 Essential (primary) hypertension: Secondary | ICD-10-CM | POA: Diagnosis not present

## 2012-06-19 DIAGNOSIS — E119 Type 2 diabetes mellitus without complications: Secondary | ICD-10-CM | POA: Diagnosis not present

## 2012-06-20 DIAGNOSIS — Z471 Aftercare following joint replacement surgery: Secondary | ICD-10-CM | POA: Diagnosis not present

## 2012-06-20 DIAGNOSIS — R262 Difficulty in walking, not elsewhere classified: Secondary | ICD-10-CM | POA: Diagnosis not present

## 2012-06-20 DIAGNOSIS — I10 Essential (primary) hypertension: Secondary | ICD-10-CM | POA: Diagnosis not present

## 2012-06-20 DIAGNOSIS — Z96659 Presence of unspecified artificial knee joint: Secondary | ICD-10-CM | POA: Diagnosis not present

## 2012-06-20 DIAGNOSIS — E119 Type 2 diabetes mellitus without complications: Secondary | ICD-10-CM | POA: Diagnosis not present

## 2012-06-21 DIAGNOSIS — Z471 Aftercare following joint replacement surgery: Secondary | ICD-10-CM | POA: Diagnosis not present

## 2012-06-21 DIAGNOSIS — E119 Type 2 diabetes mellitus without complications: Secondary | ICD-10-CM | POA: Diagnosis not present

## 2012-06-21 DIAGNOSIS — I10 Essential (primary) hypertension: Secondary | ICD-10-CM | POA: Diagnosis not present

## 2012-06-21 DIAGNOSIS — Z96659 Presence of unspecified artificial knee joint: Secondary | ICD-10-CM | POA: Diagnosis not present

## 2012-06-21 DIAGNOSIS — R262 Difficulty in walking, not elsewhere classified: Secondary | ICD-10-CM | POA: Diagnosis not present

## 2012-06-22 DIAGNOSIS — R262 Difficulty in walking, not elsewhere classified: Secondary | ICD-10-CM | POA: Diagnosis not present

## 2012-06-22 DIAGNOSIS — Z96659 Presence of unspecified artificial knee joint: Secondary | ICD-10-CM | POA: Diagnosis not present

## 2012-06-22 DIAGNOSIS — E119 Type 2 diabetes mellitus without complications: Secondary | ICD-10-CM | POA: Diagnosis not present

## 2012-06-22 DIAGNOSIS — Z471 Aftercare following joint replacement surgery: Secondary | ICD-10-CM | POA: Diagnosis not present

## 2012-06-22 DIAGNOSIS — I10 Essential (primary) hypertension: Secondary | ICD-10-CM | POA: Diagnosis not present

## 2012-06-25 DIAGNOSIS — E119 Type 2 diabetes mellitus without complications: Secondary | ICD-10-CM | POA: Diagnosis not present

## 2012-06-25 DIAGNOSIS — E7800 Pure hypercholesterolemia, unspecified: Secondary | ICD-10-CM | POA: Diagnosis not present

## 2012-06-25 DIAGNOSIS — I10 Essential (primary) hypertension: Secondary | ICD-10-CM | POA: Diagnosis not present

## 2012-06-26 DIAGNOSIS — R262 Difficulty in walking, not elsewhere classified: Secondary | ICD-10-CM | POA: Diagnosis not present

## 2012-06-26 DIAGNOSIS — Z96659 Presence of unspecified artificial knee joint: Secondary | ICD-10-CM | POA: Diagnosis not present

## 2012-06-26 DIAGNOSIS — I10 Essential (primary) hypertension: Secondary | ICD-10-CM | POA: Diagnosis not present

## 2012-06-26 DIAGNOSIS — Z471 Aftercare following joint replacement surgery: Secondary | ICD-10-CM | POA: Diagnosis not present

## 2012-06-26 DIAGNOSIS — E119 Type 2 diabetes mellitus without complications: Secondary | ICD-10-CM | POA: Diagnosis not present

## 2012-06-28 DIAGNOSIS — Z96659 Presence of unspecified artificial knee joint: Secondary | ICD-10-CM | POA: Diagnosis not present

## 2012-06-28 DIAGNOSIS — R262 Difficulty in walking, not elsewhere classified: Secondary | ICD-10-CM | POA: Diagnosis not present

## 2012-06-28 DIAGNOSIS — Z471 Aftercare following joint replacement surgery: Secondary | ICD-10-CM | POA: Diagnosis not present

## 2012-06-28 DIAGNOSIS — I10 Essential (primary) hypertension: Secondary | ICD-10-CM | POA: Diagnosis not present

## 2012-06-28 DIAGNOSIS — E119 Type 2 diabetes mellitus without complications: Secondary | ICD-10-CM | POA: Diagnosis not present

## 2012-06-29 DIAGNOSIS — R262 Difficulty in walking, not elsewhere classified: Secondary | ICD-10-CM | POA: Diagnosis not present

## 2012-06-29 DIAGNOSIS — E119 Type 2 diabetes mellitus without complications: Secondary | ICD-10-CM | POA: Diagnosis not present

## 2012-06-29 DIAGNOSIS — I10 Essential (primary) hypertension: Secondary | ICD-10-CM | POA: Diagnosis not present

## 2012-06-29 DIAGNOSIS — Z471 Aftercare following joint replacement surgery: Secondary | ICD-10-CM | POA: Diagnosis not present

## 2012-06-29 DIAGNOSIS — Z96659 Presence of unspecified artificial knee joint: Secondary | ICD-10-CM | POA: Diagnosis not present

## 2012-07-04 DIAGNOSIS — E119 Type 2 diabetes mellitus without complications: Secondary | ICD-10-CM | POA: Diagnosis not present

## 2012-07-04 DIAGNOSIS — Z96659 Presence of unspecified artificial knee joint: Secondary | ICD-10-CM | POA: Diagnosis not present

## 2012-07-04 DIAGNOSIS — I10 Essential (primary) hypertension: Secondary | ICD-10-CM | POA: Diagnosis not present

## 2012-07-04 DIAGNOSIS — R262 Difficulty in walking, not elsewhere classified: Secondary | ICD-10-CM | POA: Diagnosis not present

## 2012-07-04 DIAGNOSIS — Z471 Aftercare following joint replacement surgery: Secondary | ICD-10-CM | POA: Diagnosis not present

## 2012-07-07 DIAGNOSIS — E119 Type 2 diabetes mellitus without complications: Secondary | ICD-10-CM | POA: Diagnosis not present

## 2012-07-07 DIAGNOSIS — Z471 Aftercare following joint replacement surgery: Secondary | ICD-10-CM | POA: Diagnosis not present

## 2012-07-07 DIAGNOSIS — Z96659 Presence of unspecified artificial knee joint: Secondary | ICD-10-CM | POA: Diagnosis not present

## 2012-07-07 DIAGNOSIS — R262 Difficulty in walking, not elsewhere classified: Secondary | ICD-10-CM | POA: Diagnosis not present

## 2012-07-07 DIAGNOSIS — I10 Essential (primary) hypertension: Secondary | ICD-10-CM | POA: Diagnosis not present

## 2012-07-18 DIAGNOSIS — M25569 Pain in unspecified knee: Secondary | ICD-10-CM | POA: Diagnosis not present

## 2012-07-18 DIAGNOSIS — M1710 Unilateral primary osteoarthritis, unspecified knee: Secondary | ICD-10-CM | POA: Diagnosis not present

## 2012-07-18 DIAGNOSIS — R269 Unspecified abnormalities of gait and mobility: Secondary | ICD-10-CM | POA: Diagnosis not present

## 2012-07-18 DIAGNOSIS — M25669 Stiffness of unspecified knee, not elsewhere classified: Secondary | ICD-10-CM | POA: Diagnosis not present

## 2012-07-25 DIAGNOSIS — M25669 Stiffness of unspecified knee, not elsewhere classified: Secondary | ICD-10-CM | POA: Diagnosis not present

## 2012-07-25 DIAGNOSIS — M1710 Unilateral primary osteoarthritis, unspecified knee: Secondary | ICD-10-CM | POA: Diagnosis not present

## 2012-07-25 DIAGNOSIS — M25569 Pain in unspecified knee: Secondary | ICD-10-CM | POA: Diagnosis not present

## 2012-07-25 DIAGNOSIS — R269 Unspecified abnormalities of gait and mobility: Secondary | ICD-10-CM | POA: Diagnosis not present

## 2012-07-30 DIAGNOSIS — M25569 Pain in unspecified knee: Secondary | ICD-10-CM | POA: Diagnosis not present

## 2012-07-30 DIAGNOSIS — R269 Unspecified abnormalities of gait and mobility: Secondary | ICD-10-CM | POA: Diagnosis not present

## 2012-07-30 DIAGNOSIS — M25669 Stiffness of unspecified knee, not elsewhere classified: Secondary | ICD-10-CM | POA: Diagnosis not present

## 2012-07-30 DIAGNOSIS — M1710 Unilateral primary osteoarthritis, unspecified knee: Secondary | ICD-10-CM | POA: Diagnosis not present

## 2012-08-01 DIAGNOSIS — M25669 Stiffness of unspecified knee, not elsewhere classified: Secondary | ICD-10-CM | POA: Diagnosis not present

## 2012-08-01 DIAGNOSIS — M25569 Pain in unspecified knee: Secondary | ICD-10-CM | POA: Diagnosis not present

## 2012-08-01 DIAGNOSIS — M1710 Unilateral primary osteoarthritis, unspecified knee: Secondary | ICD-10-CM | POA: Diagnosis not present

## 2012-08-01 DIAGNOSIS — R269 Unspecified abnormalities of gait and mobility: Secondary | ICD-10-CM | POA: Diagnosis not present

## 2012-08-06 DIAGNOSIS — R269 Unspecified abnormalities of gait and mobility: Secondary | ICD-10-CM | POA: Diagnosis not present

## 2012-08-06 DIAGNOSIS — M25569 Pain in unspecified knee: Secondary | ICD-10-CM | POA: Diagnosis not present

## 2012-08-06 DIAGNOSIS — M25669 Stiffness of unspecified knee, not elsewhere classified: Secondary | ICD-10-CM | POA: Diagnosis not present

## 2012-08-06 DIAGNOSIS — I10 Essential (primary) hypertension: Secondary | ICD-10-CM | POA: Diagnosis not present

## 2012-08-06 DIAGNOSIS — M1710 Unilateral primary osteoarthritis, unspecified knee: Secondary | ICD-10-CM | POA: Diagnosis not present

## 2012-08-06 DIAGNOSIS — E039 Hypothyroidism, unspecified: Secondary | ICD-10-CM | POA: Diagnosis not present

## 2012-08-06 DIAGNOSIS — E119 Type 2 diabetes mellitus without complications: Secondary | ICD-10-CM | POA: Diagnosis not present

## 2012-08-08 DIAGNOSIS — M25669 Stiffness of unspecified knee, not elsewhere classified: Secondary | ICD-10-CM | POA: Diagnosis not present

## 2012-08-08 DIAGNOSIS — M25569 Pain in unspecified knee: Secondary | ICD-10-CM | POA: Diagnosis not present

## 2012-08-08 DIAGNOSIS — M1710 Unilateral primary osteoarthritis, unspecified knee: Secondary | ICD-10-CM | POA: Diagnosis not present

## 2012-08-08 DIAGNOSIS — R269 Unspecified abnormalities of gait and mobility: Secondary | ICD-10-CM | POA: Diagnosis not present

## 2012-08-14 DIAGNOSIS — Z1231 Encounter for screening mammogram for malignant neoplasm of breast: Secondary | ICD-10-CM | POA: Diagnosis not present

## 2012-08-15 DIAGNOSIS — R269 Unspecified abnormalities of gait and mobility: Secondary | ICD-10-CM | POA: Diagnosis not present

## 2012-08-15 DIAGNOSIS — M25669 Stiffness of unspecified knee, not elsewhere classified: Secondary | ICD-10-CM | POA: Diagnosis not present

## 2012-08-15 DIAGNOSIS — M1710 Unilateral primary osteoarthritis, unspecified knee: Secondary | ICD-10-CM | POA: Diagnosis not present

## 2012-08-15 DIAGNOSIS — M25569 Pain in unspecified knee: Secondary | ICD-10-CM | POA: Diagnosis not present

## 2012-08-16 DIAGNOSIS — M25669 Stiffness of unspecified knee, not elsewhere classified: Secondary | ICD-10-CM | POA: Diagnosis not present

## 2012-08-16 DIAGNOSIS — M1710 Unilateral primary osteoarthritis, unspecified knee: Secondary | ICD-10-CM | POA: Diagnosis not present

## 2012-08-16 DIAGNOSIS — M25569 Pain in unspecified knee: Secondary | ICD-10-CM | POA: Diagnosis not present

## 2012-08-16 DIAGNOSIS — R269 Unspecified abnormalities of gait and mobility: Secondary | ICD-10-CM | POA: Diagnosis not present

## 2012-08-22 DIAGNOSIS — M25569 Pain in unspecified knee: Secondary | ICD-10-CM | POA: Diagnosis not present

## 2012-08-22 DIAGNOSIS — M1710 Unilateral primary osteoarthritis, unspecified knee: Secondary | ICD-10-CM | POA: Diagnosis not present

## 2012-08-22 DIAGNOSIS — R269 Unspecified abnormalities of gait and mobility: Secondary | ICD-10-CM | POA: Diagnosis not present

## 2012-08-22 DIAGNOSIS — M25669 Stiffness of unspecified knee, not elsewhere classified: Secondary | ICD-10-CM | POA: Diagnosis not present

## 2012-08-27 DIAGNOSIS — I10 Essential (primary) hypertension: Secondary | ICD-10-CM | POA: Diagnosis not present

## 2012-08-27 DIAGNOSIS — E7800 Pure hypercholesterolemia, unspecified: Secondary | ICD-10-CM | POA: Diagnosis not present

## 2012-08-27 DIAGNOSIS — E119 Type 2 diabetes mellitus without complications: Secondary | ICD-10-CM | POA: Diagnosis not present

## 2012-08-27 DIAGNOSIS — E039 Hypothyroidism, unspecified: Secondary | ICD-10-CM | POA: Diagnosis not present

## 2012-08-29 DIAGNOSIS — M25669 Stiffness of unspecified knee, not elsewhere classified: Secondary | ICD-10-CM | POA: Diagnosis not present

## 2012-08-29 DIAGNOSIS — M1710 Unilateral primary osteoarthritis, unspecified knee: Secondary | ICD-10-CM | POA: Diagnosis not present

## 2012-08-29 DIAGNOSIS — R269 Unspecified abnormalities of gait and mobility: Secondary | ICD-10-CM | POA: Diagnosis not present

## 2012-08-29 DIAGNOSIS — M25569 Pain in unspecified knee: Secondary | ICD-10-CM | POA: Diagnosis not present

## 2012-08-30 DIAGNOSIS — M1710 Unilateral primary osteoarthritis, unspecified knee: Secondary | ICD-10-CM | POA: Diagnosis not present

## 2012-09-10 DIAGNOSIS — L570 Actinic keratosis: Secondary | ICD-10-CM | POA: Diagnosis not present

## 2012-09-20 DIAGNOSIS — H4011X0 Primary open-angle glaucoma, stage unspecified: Secondary | ICD-10-CM | POA: Diagnosis not present

## 2012-09-20 DIAGNOSIS — H3530 Unspecified macular degeneration: Secondary | ICD-10-CM | POA: Diagnosis not present

## 2012-09-20 DIAGNOSIS — Z961 Presence of intraocular lens: Secondary | ICD-10-CM | POA: Diagnosis not present

## 2012-09-20 DIAGNOSIS — S0510XA Contusion of eyeball and orbital tissues, unspecified eye, initial encounter: Secondary | ICD-10-CM | POA: Diagnosis not present

## 2012-09-25 DIAGNOSIS — E119 Type 2 diabetes mellitus without complications: Secondary | ICD-10-CM | POA: Diagnosis not present

## 2012-09-25 DIAGNOSIS — I10 Essential (primary) hypertension: Secondary | ICD-10-CM | POA: Diagnosis not present

## 2012-09-25 DIAGNOSIS — E7800 Pure hypercholesterolemia, unspecified: Secondary | ICD-10-CM | POA: Diagnosis not present

## 2012-10-08 ENCOUNTER — Encounter (INDEPENDENT_AMBULATORY_CARE_PROVIDER_SITE_OTHER): Payer: Medicare Other

## 2012-10-08 DIAGNOSIS — M79609 Pain in unspecified limb: Secondary | ICD-10-CM | POA: Diagnosis not present

## 2012-10-08 DIAGNOSIS — M7989 Other specified soft tissue disorders: Secondary | ICD-10-CM | POA: Diagnosis not present

## 2012-10-08 DIAGNOSIS — R52 Pain, unspecified: Secondary | ICD-10-CM

## 2012-10-08 DIAGNOSIS — R609 Edema, unspecified: Secondary | ICD-10-CM

## 2012-10-08 DIAGNOSIS — M1710 Unilateral primary osteoarthritis, unspecified knee: Secondary | ICD-10-CM | POA: Diagnosis not present

## 2012-11-21 DIAGNOSIS — H4011X0 Primary open-angle glaucoma, stage unspecified: Secondary | ICD-10-CM | POA: Diagnosis not present

## 2012-11-21 DIAGNOSIS — H4920 Sixth [abducent] nerve palsy, unspecified eye: Secondary | ICD-10-CM | POA: Diagnosis not present

## 2012-11-21 DIAGNOSIS — Z961 Presence of intraocular lens: Secondary | ICD-10-CM | POA: Diagnosis not present

## 2012-12-10 DIAGNOSIS — M1710 Unilateral primary osteoarthritis, unspecified knee: Secondary | ICD-10-CM | POA: Diagnosis not present

## 2012-12-20 DIAGNOSIS — R3989 Other symptoms and signs involving the genitourinary system: Secondary | ICD-10-CM | POA: Diagnosis not present

## 2012-12-20 DIAGNOSIS — N390 Urinary tract infection, site not specified: Secondary | ICD-10-CM | POA: Diagnosis not present

## 2012-12-27 DIAGNOSIS — N390 Urinary tract infection, site not specified: Secondary | ICD-10-CM | POA: Diagnosis not present

## 2013-01-07 DIAGNOSIS — R3989 Other symptoms and signs involving the genitourinary system: Secondary | ICD-10-CM | POA: Diagnosis not present

## 2013-01-11 DIAGNOSIS — Z23 Encounter for immunization: Secondary | ICD-10-CM | POA: Diagnosis not present

## 2013-02-21 DIAGNOSIS — E039 Hypothyroidism, unspecified: Secondary | ICD-10-CM | POA: Diagnosis not present

## 2013-02-21 DIAGNOSIS — E119 Type 2 diabetes mellitus without complications: Secondary | ICD-10-CM | POA: Diagnosis not present

## 2013-02-21 DIAGNOSIS — I10 Essential (primary) hypertension: Secondary | ICD-10-CM | POA: Diagnosis not present

## 2013-02-26 DIAGNOSIS — E039 Hypothyroidism, unspecified: Secondary | ICD-10-CM | POA: Diagnosis not present

## 2013-02-26 DIAGNOSIS — I10 Essential (primary) hypertension: Secondary | ICD-10-CM | POA: Diagnosis not present

## 2013-02-26 DIAGNOSIS — E119 Type 2 diabetes mellitus without complications: Secondary | ICD-10-CM | POA: Diagnosis not present

## 2013-02-26 DIAGNOSIS — E7800 Pure hypercholesterolemia, unspecified: Secondary | ICD-10-CM | POA: Diagnosis not present

## 2013-03-08 DIAGNOSIS — L0201 Cutaneous abscess of face: Secondary | ICD-10-CM | POA: Diagnosis not present

## 2013-03-26 DIAGNOSIS — E119 Type 2 diabetes mellitus without complications: Secondary | ICD-10-CM | POA: Diagnosis not present

## 2013-03-26 DIAGNOSIS — I10 Essential (primary) hypertension: Secondary | ICD-10-CM | POA: Diagnosis not present

## 2013-03-26 DIAGNOSIS — E7800 Pure hypercholesterolemia, unspecified: Secondary | ICD-10-CM | POA: Diagnosis not present

## 2013-04-04 DIAGNOSIS — M1710 Unilateral primary osteoarthritis, unspecified knee: Secondary | ICD-10-CM | POA: Diagnosis not present

## 2013-05-03 DIAGNOSIS — F3289 Other specified depressive episodes: Secondary | ICD-10-CM | POA: Diagnosis not present

## 2013-05-03 DIAGNOSIS — G3184 Mild cognitive impairment, so stated: Secondary | ICD-10-CM | POA: Diagnosis not present

## 2013-05-03 DIAGNOSIS — G4700 Insomnia, unspecified: Secondary | ICD-10-CM | POA: Diagnosis not present

## 2013-05-03 DIAGNOSIS — F329 Major depressive disorder, single episode, unspecified: Secondary | ICD-10-CM | POA: Diagnosis not present

## 2013-05-07 DIAGNOSIS — H353190 Nonexudative age-related macular degeneration, unspecified eye, stage unspecified: Secondary | ICD-10-CM | POA: Diagnosis not present

## 2013-05-07 DIAGNOSIS — H04129 Dry eye syndrome of unspecified lacrimal gland: Secondary | ICD-10-CM | POA: Diagnosis not present

## 2013-05-07 DIAGNOSIS — H5316 Psychophysical visual disturbances: Secondary | ICD-10-CM | POA: Diagnosis not present

## 2013-05-07 DIAGNOSIS — H0230 Blepharochalasis unspecified eye, unspecified eyelid: Secondary | ICD-10-CM | POA: Diagnosis not present

## 2013-05-07 DIAGNOSIS — Z961 Presence of intraocular lens: Secondary | ICD-10-CM | POA: Diagnosis not present

## 2013-05-07 DIAGNOSIS — H1045 Other chronic allergic conjunctivitis: Secondary | ICD-10-CM | POA: Diagnosis not present

## 2013-05-07 DIAGNOSIS — E119 Type 2 diabetes mellitus without complications: Secondary | ICD-10-CM | POA: Diagnosis not present

## 2013-05-07 DIAGNOSIS — H4011X0 Primary open-angle glaucoma, stage unspecified: Secondary | ICD-10-CM | POA: Diagnosis not present

## 2013-05-09 ENCOUNTER — Other Ambulatory Visit: Payer: Self-pay | Admitting: Internal Medicine

## 2013-05-09 DIAGNOSIS — F0390 Unspecified dementia without behavioral disturbance: Secondary | ICD-10-CM

## 2013-05-12 ENCOUNTER — Ambulatory Visit
Admission: RE | Admit: 2013-05-12 | Discharge: 2013-05-12 | Disposition: A | Discharge status: Home / Self Care | Payer: Medicare Other | Source: Ambulatory Visit | Attending: Internal Medicine | Admitting: Internal Medicine

## 2013-05-12 DIAGNOSIS — F0391 Unspecified dementia with behavioral disturbance: Secondary | ICD-10-CM | POA: Diagnosis not present

## 2013-05-12 DIAGNOSIS — F03918 Unspecified dementia, unspecified severity, with other behavioral disturbance: Secondary | ICD-10-CM | POA: Diagnosis not present

## 2013-05-12 DIAGNOSIS — F0390 Unspecified dementia without behavioral disturbance: Secondary | ICD-10-CM

## 2013-05-14 DIAGNOSIS — F0390 Unspecified dementia without behavioral disturbance: Secondary | ICD-10-CM | POA: Diagnosis not present

## 2013-05-14 DIAGNOSIS — R4182 Altered mental status, unspecified: Secondary | ICD-10-CM | POA: Diagnosis not present

## 2013-05-14 DIAGNOSIS — E7800 Pure hypercholesterolemia, unspecified: Secondary | ICD-10-CM | POA: Diagnosis not present

## 2013-05-14 DIAGNOSIS — G3184 Mild cognitive impairment, so stated: Secondary | ICD-10-CM | POA: Diagnosis not present

## 2013-05-14 DIAGNOSIS — I10 Essential (primary) hypertension: Secondary | ICD-10-CM | POA: Diagnosis not present

## 2013-05-14 DIAGNOSIS — E119 Type 2 diabetes mellitus without complications: Secondary | ICD-10-CM | POA: Diagnosis not present

## 2013-05-14 DIAGNOSIS — R059 Cough, unspecified: Secondary | ICD-10-CM | POA: Diagnosis not present

## 2013-05-14 DIAGNOSIS — R05 Cough: Secondary | ICD-10-CM | POA: Diagnosis not present

## 2013-05-28 DIAGNOSIS — G4730 Sleep apnea, unspecified: Secondary | ICD-10-CM | POA: Diagnosis not present

## 2013-05-30 DIAGNOSIS — I10 Essential (primary) hypertension: Secondary | ICD-10-CM | POA: Diagnosis not present

## 2013-05-30 DIAGNOSIS — E119 Type 2 diabetes mellitus without complications: Secondary | ICD-10-CM | POA: Diagnosis not present

## 2013-05-30 DIAGNOSIS — E039 Hypothyroidism, unspecified: Secondary | ICD-10-CM | POA: Diagnosis not present

## 2013-05-30 DIAGNOSIS — E7800 Pure hypercholesterolemia, unspecified: Secondary | ICD-10-CM | POA: Diagnosis not present

## 2013-06-24 DIAGNOSIS — I10 Essential (primary) hypertension: Secondary | ICD-10-CM | POA: Diagnosis not present

## 2013-07-24 DIAGNOSIS — R413 Other amnesia: Secondary | ICD-10-CM | POA: Diagnosis not present

## 2013-07-29 DIAGNOSIS — M1710 Unilateral primary osteoarthritis, unspecified knee: Secondary | ICD-10-CM | POA: Diagnosis not present

## 2013-08-16 DIAGNOSIS — Z803 Family history of malignant neoplasm of breast: Secondary | ICD-10-CM | POA: Diagnosis not present

## 2013-08-16 DIAGNOSIS — Z1231 Encounter for screening mammogram for malignant neoplasm of breast: Secondary | ICD-10-CM | POA: Diagnosis not present

## 2013-08-22 DIAGNOSIS — E7800 Pure hypercholesterolemia, unspecified: Secondary | ICD-10-CM | POA: Diagnosis not present

## 2013-08-22 DIAGNOSIS — E039 Hypothyroidism, unspecified: Secondary | ICD-10-CM | POA: Diagnosis not present

## 2013-08-22 DIAGNOSIS — E119 Type 2 diabetes mellitus without complications: Secondary | ICD-10-CM | POA: Diagnosis not present

## 2013-08-22 DIAGNOSIS — I10 Essential (primary) hypertension: Secondary | ICD-10-CM | POA: Diagnosis not present

## 2013-08-27 DIAGNOSIS — E119 Type 2 diabetes mellitus without complications: Secondary | ICD-10-CM | POA: Diagnosis not present

## 2013-08-27 DIAGNOSIS — F411 Generalized anxiety disorder: Secondary | ICD-10-CM | POA: Diagnosis not present

## 2013-08-27 DIAGNOSIS — I10 Essential (primary) hypertension: Secondary | ICD-10-CM | POA: Diagnosis not present

## 2013-08-27 DIAGNOSIS — E7800 Pure hypercholesterolemia, unspecified: Secondary | ICD-10-CM | POA: Diagnosis not present

## 2013-09-02 DIAGNOSIS — K2100 Gastro-esophageal reflux disease with esophagitis, without bleeding: Secondary | ICD-10-CM | POA: Diagnosis not present

## 2013-09-02 DIAGNOSIS — R1310 Dysphagia, unspecified: Secondary | ICD-10-CM | POA: Diagnosis not present

## 2013-09-19 DIAGNOSIS — I10 Essential (primary) hypertension: Secondary | ICD-10-CM | POA: Diagnosis not present

## 2013-09-19 DIAGNOSIS — E119 Type 2 diabetes mellitus without complications: Secondary | ICD-10-CM | POA: Diagnosis not present

## 2013-09-19 DIAGNOSIS — E7800 Pure hypercholesterolemia, unspecified: Secondary | ICD-10-CM | POA: Diagnosis not present

## 2013-12-30 DIAGNOSIS — E039 Hypothyroidism, unspecified: Secondary | ICD-10-CM | POA: Diagnosis not present

## 2013-12-30 DIAGNOSIS — E119 Type 2 diabetes mellitus without complications: Secondary | ICD-10-CM | POA: Diagnosis not present

## 2013-12-30 DIAGNOSIS — I10 Essential (primary) hypertension: Secondary | ICD-10-CM | POA: Diagnosis not present

## 2014-01-02 DIAGNOSIS — I10 Essential (primary) hypertension: Secondary | ICD-10-CM | POA: Diagnosis not present

## 2014-01-02 DIAGNOSIS — J329 Chronic sinusitis, unspecified: Secondary | ICD-10-CM | POA: Diagnosis not present

## 2014-01-02 DIAGNOSIS — E039 Hypothyroidism, unspecified: Secondary | ICD-10-CM | POA: Diagnosis not present

## 2014-01-02 DIAGNOSIS — E119 Type 2 diabetes mellitus without complications: Secondary | ICD-10-CM | POA: Diagnosis not present

## 2014-01-07 DIAGNOSIS — E119 Type 2 diabetes mellitus without complications: Secondary | ICD-10-CM | POA: Diagnosis not present

## 2014-01-07 DIAGNOSIS — H4011X0 Primary open-angle glaucoma, stage unspecified: Secondary | ICD-10-CM | POA: Diagnosis not present

## 2014-01-07 DIAGNOSIS — H353190 Nonexudative age-related macular degeneration, unspecified eye, stage unspecified: Secondary | ICD-10-CM | POA: Diagnosis not present

## 2014-01-07 DIAGNOSIS — Z961 Presence of intraocular lens: Secondary | ICD-10-CM | POA: Diagnosis not present

## 2014-01-30 DIAGNOSIS — Z23 Encounter for immunization: Secondary | ICD-10-CM | POA: Diagnosis not present

## 2014-03-17 DIAGNOSIS — E780 Pure hypercholesterolemia: Secondary | ICD-10-CM | POA: Diagnosis not present

## 2014-03-17 DIAGNOSIS — E118 Type 2 diabetes mellitus with unspecified complications: Secondary | ICD-10-CM | POA: Diagnosis not present

## 2014-03-20 DIAGNOSIS — E118 Type 2 diabetes mellitus with unspecified complications: Secondary | ICD-10-CM | POA: Diagnosis not present

## 2014-04-13 ENCOUNTER — Emergency Department (HOSPITAL_COMMUNITY)
Admission: EM | Admit: 2014-04-13 | Discharge: 2014-04-13 | Disposition: A | Discharge status: Home / Self Care | Payer: Medicare Other | Attending: Emergency Medicine | Admitting: Emergency Medicine

## 2014-04-13 ENCOUNTER — Encounter (HOSPITAL_COMMUNITY): Payer: Self-pay | Admitting: *Deleted

## 2014-04-13 ENCOUNTER — Emergency Department (HOSPITAL_COMMUNITY): Payer: Medicare Other

## 2014-04-13 DIAGNOSIS — R748 Abnormal levels of other serum enzymes: Secondary | ICD-10-CM

## 2014-04-13 DIAGNOSIS — R1013 Epigastric pain: Secondary | ICD-10-CM | POA: Diagnosis not present

## 2014-04-13 DIAGNOSIS — K449 Diaphragmatic hernia without obstruction or gangrene: Secondary | ICD-10-CM

## 2014-04-13 DIAGNOSIS — Z880 Allergy status to penicillin: Secondary | ICD-10-CM | POA: Diagnosis not present

## 2014-04-13 DIAGNOSIS — Z7901 Long term (current) use of anticoagulants: Secondary | ICD-10-CM | POA: Diagnosis not present

## 2014-04-13 DIAGNOSIS — E119 Type 2 diabetes mellitus without complications: Secondary | ICD-10-CM | POA: Insufficient documentation

## 2014-04-13 DIAGNOSIS — E0590 Thyrotoxicosis, unspecified without thyrotoxic crisis or storm: Secondary | ICD-10-CM | POA: Insufficient documentation

## 2014-04-13 DIAGNOSIS — J984 Other disorders of lung: Secondary | ICD-10-CM | POA: Diagnosis not present

## 2014-04-13 DIAGNOSIS — Z9981 Dependence on supplemental oxygen: Secondary | ICD-10-CM | POA: Insufficient documentation

## 2014-04-13 DIAGNOSIS — I10 Essential (primary) hypertension: Secondary | ICD-10-CM | POA: Insufficient documentation

## 2014-04-13 DIAGNOSIS — G4730 Sleep apnea, unspecified: Secondary | ICD-10-CM | POA: Insufficient documentation

## 2014-04-13 DIAGNOSIS — F419 Anxiety disorder, unspecified: Secondary | ICD-10-CM | POA: Diagnosis not present

## 2014-04-13 DIAGNOSIS — Z87828 Personal history of other (healed) physical injury and trauma: Secondary | ICD-10-CM | POA: Insufficient documentation

## 2014-04-13 DIAGNOSIS — R7989 Other specified abnormal findings of blood chemistry: Secondary | ICD-10-CM | POA: Diagnosis not present

## 2014-04-13 DIAGNOSIS — M1990 Unspecified osteoarthritis, unspecified site: Secondary | ICD-10-CM | POA: Insufficient documentation

## 2014-04-13 DIAGNOSIS — Z79899 Other long term (current) drug therapy: Secondary | ICD-10-CM | POA: Insufficient documentation

## 2014-04-13 DIAGNOSIS — K219 Gastro-esophageal reflux disease without esophagitis: Secondary | ICD-10-CM | POA: Diagnosis not present

## 2014-04-13 DIAGNOSIS — R0789 Other chest pain: Secondary | ICD-10-CM | POA: Diagnosis not present

## 2014-04-13 DIAGNOSIS — K5900 Constipation, unspecified: Secondary | ICD-10-CM | POA: Diagnosis not present

## 2014-04-13 DIAGNOSIS — R100 Acute abdomen: Secondary | ICD-10-CM | POA: Diagnosis not present

## 2014-04-13 DIAGNOSIS — R6889 Other general symptoms and signs: Secondary | ICD-10-CM | POA: Diagnosis not present

## 2014-04-13 LAB — COMPREHENSIVE METABOLIC PANEL
ALK PHOS: 117 U/L (ref 39–117)
ALT: 12 U/L (ref 0–35)
AST: 16 U/L (ref 0–37)
Albumin: 3.8 g/dL (ref 3.5–5.2)
Anion gap: 15 (ref 5–15)
BUN: 19 mg/dL (ref 6–23)
CALCIUM: 9.6 mg/dL (ref 8.4–10.5)
CO2: 24 meq/L (ref 19–32)
Chloride: 100 mEq/L (ref 96–112)
Creatinine, Ser: 0.98 mg/dL (ref 0.50–1.10)
GFR, EST AFRICAN AMERICAN: 57 mL/min — AB (ref >90)
GFR, EST NON AFRICAN AMERICAN: 49 mL/min — AB (ref >90)
GLUCOSE: 115 mg/dL — AB (ref 70–99)
POTASSIUM: 3.7 meq/L (ref 3.7–5.3)
SODIUM: 139 meq/L (ref 137–147)
Total Bilirubin: 0.3 mg/dL (ref 0.3–1.2)
Total Protein: 6.4 g/dL (ref 6.0–8.3)

## 2014-04-13 LAB — CBC
HCT: 34.6 % — ABNORMAL LOW (ref 36.0–46.0)
Hemoglobin: 11.5 g/dL — ABNORMAL LOW (ref 12.0–15.0)
MCH: 29.9 pg (ref 26.0–34.0)
MCHC: 33.2 g/dL (ref 30.0–36.0)
MCV: 90.1 fL (ref 78.0–100.0)
PLATELETS: 182 10*3/uL (ref 150–400)
RBC: 3.84 MIL/uL — ABNORMAL LOW (ref 3.87–5.11)
RDW: 15.4 % (ref 11.5–15.5)
WBC: 5 10*3/uL (ref 4.0–10.5)

## 2014-04-13 LAB — LIPASE, BLOOD: Lipase: 122 U/L — ABNORMAL HIGH (ref 11–59)

## 2014-04-13 LAB — I-STAT TROPONIN, ED: Troponin i, poc: 0 ng/mL (ref 0.00–0.08)

## 2014-04-13 MED ORDER — HYDROCODONE-ACETAMINOPHEN 5-325 MG PO TABS: 1.0000 | ORAL_TABLET | Freq: Four times a day (QID) | ORAL | Status: DC | PRN

## 2014-04-13 MED ORDER — GI COCKTAIL ~~LOC~~
30.0000 mL | Freq: Once | ORAL | Status: AC
  Administered 2014-04-13: 30 mL via ORAL
  Filled 2014-04-13: qty 30

## 2014-04-13 NOTE — ED Notes (Signed)
Dr. Wentz at bedside. 

## 2014-04-13 NOTE — Discharge Instructions (Signed)
Eat a bland diet tonight, and advance your diet to a solid food, low fat diet as tolerated. If any foods increase your pain, avoid those foods, and back down slightly on the solid foods. Make an appointment with your GI doctor or primary care doctor in 2-3 days for a repeat lipase level and ultrasound to evaluate your gallbladder and pancreas. Use norco for severe pain as directed, as needed, but don't drive. Return to the ER for changes or worsening symptoms.   Abdominal Pain, Women Abdominal (stomach, pelvic, or belly) pain can be caused by many things. It is important to tell your doctor:  The location of the pain.  Does it come and go or is it present all the time?  Are there things that start the pain (eating certain foods, exercise)?  Are there other symptoms associated with the pain (fever, nausea, vomiting, diarrhea)? All of this is helpful to know when trying to find the cause of the pain. CAUSES   Stomach: virus or bacteria infection, or ulcer.  Intestine: appendicitis (inflamed appendix), regional ileitis (Crohn's disease), ulcerative colitis (inflamed colon), irritable bowel syndrome, diverticulitis (inflamed diverticulum of the colon), or cancer of the stomach or intestine.  Gallbladder disease or stones in the gallbladder.  Kidney disease, kidney stones, or infection.  Pancreas infection or cancer.  Fibromyalgia (pain disorder).  Diseases of the female organs:  Uterus: fibroid (non-cancerous) tumors or infection.  Fallopian tubes: infection or tubal pregnancy.  Ovary: cysts or tumors.  Pelvic adhesions (scar tissue).  Endometriosis (uterus lining tissue growing in the pelvis and on the pelvic organs).  Pelvic congestion syndrome (female organs filling up with blood just before the menstrual period).  Pain with the menstrual period.  Pain with ovulation (producing an egg).  Pain with an IUD (intrauterine device, birth control) in the uterus.  Cancer of the  female organs.  Functional pain (pain not caused by a disease, may improve without treatment).  Psychological pain.  Depression. DIAGNOSIS  Your doctor will decide the seriousness of your pain by doing an examination.  Blood tests.  X-rays.  Ultrasound.  CT scan (computed tomography, special type of X-ray).  MRI (magnetic resonance imaging).  Cultures, for infection.  Barium enema (dye inserted in the large intestine, to better view it with X-rays).  Colonoscopy (looking in intestine with a lighted tube).  Laparoscopy (minor surgery, looking in abdomen with a lighted tube).  Major abdominal exploratory surgery (looking in abdomen with a large incision). TREATMENT  The treatment will depend on the cause of the pain.   Many cases can be observed and treated at home.  Over-the-counter medicines recommended by your caregiver.  Prescription medicine.  Antibiotics, for infection.  Birth control pills, for painful periods or for ovulation pain.  Hormone treatment, for endometriosis.  Nerve blocking injections.  Physical therapy.  Antidepressants.  Counseling with a psychologist or psychiatrist.  Minor or major surgery. HOME CARE INSTRUCTIONS   Do not take laxatives, unless directed by your caregiver.  Take over-the-counter pain medicine only if ordered by your caregiver. Do not take aspirin because it can cause an upset stomach or bleeding.  Try a clear liquid diet (broth or water) as ordered by your caregiver. Slowly move to a bland diet, as tolerated, if the pain is related to the stomach or intestine.  Have a thermometer and take your temperature several times a day, and record it.  Bed rest and sleep, if it helps the pain.  Avoid sexual intercourse, if  it causes pain.  Avoid stressful situations.  Keep your follow-up appointments and tests, as your caregiver orders.  If the pain does not go away with medicine or surgery, you may  try:  Acupuncture.  Relaxation exercises (yoga, meditation).  Group therapy.  Counseling. SEEK MEDICAL CARE IF:   You notice certain foods cause stomach pain.  Your home care treatment is not helping your pain.  You need stronger pain medicine.  You want your IUD removed.  You feel faint or lightheaded.  You develop nausea and vomiting.  You develop a rash.  You are having side effects or an allergy to your medicine. SEEK IMMEDIATE MEDICAL CARE IF:   Your pain does not go away or gets worse.  You have a fever.  Your pain is felt only in portions of the abdomen. The right side could possibly be appendicitis. The left lower portion of the abdomen could be colitis or diverticulitis.  You are passing blood in your stools (bright red or black tarry stools, with or without vomiting).  You have blood in your urine.  You develop chills, with or without a fever.  You pass out. MAKE SURE YOU:   Understand these instructions.  Will watch your condition.  Will get help right away if you are not doing well or get worse. Document Released: 02/13/2007 Document Revised: 09/02/2013 Document Reviewed: 03/05/2009 [facility] Patient Information 2015 [place], [place]. This information is not intended to replace advice given to you by your health care provider. Make sure you discuss any questions you have with your health care provider.

## 2014-04-13 NOTE — ED Notes (Addendum)
Per EMS- Pt states that she began having epigastric and left chest pain after eating that was decreased with gas-x. Pt took asprin prior to EMS arrival.

## 2014-04-13 NOTE — ED Notes (Signed)
Pt monitored by pulse ox, bp cuff, and 12-lead. 

## 2014-04-13 NOTE — ED Provider Notes (Signed)
CSN: [id number]     Arrival date & time 04/13/14  1557 History   First MD Initiated Contact with Patient 04/13/14 1627     Chief Complaint  Patient presents with  . Abdominal Pain     (Consider location/radiation/quality/duration/timing/severity/associated sxs/prior Treatment) HPI Comments: Cristina Middleton is a 80 y.o. female with a PMHx of GERD, hiatal hernia, DM (diet controlled), sleep apnea, HTN, hyperthyroidism, and anxiety, who presents to the ED with complaints of sudden onset epigastric pain that began at 2pm while she was eating steak, gravy, and a baked potato. She reports that the pain was initially 10/10, but has decreased down to 2/10 at this time. She reports that the pain was initially sharp constant and radiated to her left chest and generalized abdomen, then as it has improved it is now just in her epigastrum. She took a Gas-X with no relief, then the 911 operator suggested taking 4 ASA which she reports also didn't relieve her pain. She states that the pain worsened with eating, and some pain with inspiration. She has a known hiatal hernia, has seen her GI doctor in the past and takes aciphex chronically. She states this pain is similar to her hiatal hernia pain. Endorses associated dysphagia immediately after the pain began, and now she feels better. Has chronic constipation, unchanged. Denies fevers, chills, SOB, wheezing, cough, heartburn, n/v/d, obstipation, melena, hematochezia, paresthesias, myalgias, arthralgias, diaphoresis, jaw/neck/back/arm pain, LE swelling, or focal neuro deficits. Denies recent travel or estrogen use.   Patient is a 80 y.o. female presenting with abdominal pain. The history is provided by the patient. No language interpreter was used.  Abdominal Pain Pain location:  Epigastric Pain quality: sharp   Pain radiates to:  Chest Pain severity:  Moderate (10/10 initially then 2/10 now) Onset quality:  Sudden Duration:  3 hours Timing:   Constant Progression:  Partially resolved Chronicity:  Recurrent Context: eating   Relieved by:  Nothing Worsened by:  Eating Ineffective treatments:  NSAIDs and OTC medications (gasX and ASA) Associated symptoms: chest pain (epigastric/L lower chest) and constipation (chronic, ongoing, unchanged)   Associated symptoms: no anorexia, no belching, no chills, no cough, no diarrhea, no dysuria, no fever, no flatus, no hematemesis, no hematochezia, no hematuria, no melena, no nausea, no shortness of breath, no sore throat and no vomiting   Risk factors: being elderly     Past Medical History  Diagnosis Date  . PONV (postoperative nausea and vomiting)     YRS AGO.....(6-7 YRS)  . GERD (gastroesophageal reflux disease)     TAKES  ACIPHEX  . Diabetes mellitus without complication   . Arthritis   . Sleep apnea     WEARS MASK EVERY NOW AND THEN  . Hypertension   . Hyperthyroidism     NOT SURE WHICH ONE  . Anxiety   . Dislocated shoulder     WAS POPPED BACK IN PLACE  2006???   Past Surgical History  Procedure Laterality Date  . Breast surgery      CYST REMOVED -- LEFT  . Lipoma excision      FROM LEFT BACK  . Eye surgery      BILATERAL  . Tonsillectomy    . Total knee arthroplasty  05/25/2012    right knee  . Total knee arthroplasty  05/25/2012    Procedure: TOTAL KNEE ARTHROPLASTY;  Surgeon: Yvette Rack., MD;  Location: [facility];  Service: Orthopedics;  Laterality: Right;   Family History  Problem Relation  Age of Onset  . Other Mother   . Other Father    History  Substance Use Topics  . Smoking status: Never Smoker   . Smokeless tobacco: Never Used  . Alcohol Use: No   OB History    No data available     Review of Systems  Constitutional: Negative for fever, chills and diaphoresis.  HENT: Negative for sore throat.   Respiratory: Negative for cough, chest tightness, shortness of breath and wheezing.   Cardiovascular: Positive for chest pain (epigastric/L lower  chest). Negative for palpitations and leg swelling.  Gastrointestinal: Positive for abdominal pain and constipation (chronic, ongoing, unchanged). Negative for nausea, vomiting, diarrhea, blood in stool, melena, hematochezia, anorexia, flatus and hematemesis.  Genitourinary: Negative for dysuria, urgency, frequency, hematuria and flank pain.  Musculoskeletal: Negative for myalgias, back pain and arthralgias.  Skin: Negative for color change.  Neurological: Negative for dizziness, syncope, weakness, light-headedness, numbness and headaches.  Psychiatric/Behavioral: Negative for confusion.   10 Systems reviewed and are negative for acute change except as noted in the HPI.    Allergies  Penicillins and Sulfa antibiotics  Home Medications   Prior to Admission medications   Medication Sig Start Date End Date Taking? Authorizing Provider  acetaminophen (TYLENOL) 325 MG tablet Take 2 tablets (650 mg total) by mouth every 6 (six) hours as needed (or Fever >/= 101). 05/28/12   Joshua Chadwell, PA-C  bisacodyl (DULCOLAX) 10 MG suppository Place 1 suppository (10 mg total) rectally daily as needed. 05/28/12   Joshua Chadwell, PA-C  calcium carbonate (OS-CAL) 600 MG TABS Take 600 mg by mouth daily.    Historical Provider, MD  cholecalciferol (VITAMIN D) 400 UNITS TABS Take 400 Units by mouth daily.    Historical Provider, MD  CINNAMON PO Take 2 capsules by mouth daily.     Historical Provider, MD  docusate sodium 100 MG CAPS Take 100 mg by mouth 2 (two) times daily. 05/28/12   Joshua Chadwell, PA-C  enoxaparin (LOVENOX) 40 MG/0.4ML injection Inject 0.4 mLs (40 mg total) into the skin daily. 05/29/12   Joshua Chadwell, PA-C  escitalopram (LEXAPRO) 20 MG tablet Take 20 mg by mouth daily.    Historical Provider, MD  HYDROcodone-acetaminophen (NORCO/VICODIN) 5-325 MG per tablet Take 1-2 tablets by mouth every 4 (four) hours as needed (breakthrough pain). 05/28/12   Joshua Chadwell, PA-C  indapamide (LOZOL) 2.5  MG tablet Take 2.5 mg by mouth See admin instructions. 3 days a week (Monday, Wednesday, Friday)    Historical Provider, MD  levothyroxine (SYNTHROID, LEVOTHROID) 100 MCG tablet Take 100 mcg by mouth daily.    Historical Provider, MD  meloxicam (MOBIC) 15 MG tablet Take 15 mg by mouth daily.    Historical Provider, MD  metFORMIN (GLUCOPHAGE) 500 MG tablet Take 500 mg by mouth once.    Historical Provider, MD  multivitamin-lutein [facility]) CAPS Take 1 capsule by mouth daily.    Historical Provider, MD  RABEprazole (ACIPHEX) 20 MG tablet Take 20 mg by mouth daily.    Historical Provider, MD  simvastatin (ZOCOR) 20 MG tablet Take 20 mg by mouth every evening.    Historical Provider, MD  vitamin B-12 (CYANOCOBALAMIN) 500 MCG tablet Take 500 mcg by mouth daily.    Historical Provider, MD   BP 146/80 mmHg  Pulse 86  Temp(Src) 98.3 F (36.8 C) (Oral)  Resp 19  SpO2 94% Physical Exam  Constitutional: She is oriented to person, place, and time. Vital signs are normal. She appears well-developed  and well-nourished.  Non-toxic appearance. No distress.  WDWN NAD, nontoxic well appearing  HENT:  Head: Normocephalic and atraumatic.  Mouth/Throat: Oropharynx is clear and moist and mucous membranes are normal.  Eyes: Conjunctivae and EOM are normal. Right eye exhibits no discharge. Left eye exhibits no discharge.  Neck: Normal range of motion. Neck supple.  Cardiovascular: Normal rate, regular rhythm, normal heart sounds and intact distal pulses.  Exam reveals no gallop and no friction rub.   No murmur heard. RRR, nl s1/s2, no m/r/g  Pulmonary/Chest: Effort normal and breath sounds normal. No respiratory distress. She has no decreased breath sounds. She has no wheezes. She has no rhonchi. She has no rales. She exhibits no tenderness and no crepitus.  CTAB in all lung fields Chest wall nonTTP without crepitus  Abdominal: Soft. Normal appearance and bowel sounds are normal. She exhibits no  distension. There is tenderness in the epigastric area. There is no rigidity, no rebound, no guarding, no tenderness at McBurney's point and negative Murphy's sign.    Soft, ND, +BS throughout, with mild epigastrum TTP with no r/g/r, neg murphy's, neg mcburney's  Musculoskeletal: Normal range of motion.  MAE x4  Neurological: She is alert and oriented to person, place, and time. She has normal strength. No sensory deficit.  Strength 5/5 in all extremities Sensation grossly intact in all extremities  Skin: Skin is warm, dry and intact. No rash noted.  Psychiatric: She has a normal mood and affect.  Nursing note and vitals reviewed.   ED Course  Procedures (including critical care time) Labs Review Labs Reviewed  LIPASE, BLOOD - Abnormal; Notable for the following:    Lipase 122 (*)    All other components within normal limits  COMPREHENSIVE METABOLIC PANEL - Abnormal; Notable for the following:    Glucose, Bld 115 (*)    GFR calc non Af Amer 49 (*)    GFR calc Af Amer 57 (*)    All other components within normal limits  CBC - Abnormal; Notable for the following:    RBC 3.84 (*)    Hemoglobin 11.5 (*)    HCT 34.6 (*)    All other components within normal limits  I-STAT TROPOININ, ED    Imaging Review Dg Abd Acute W/chest  04/13/2014   CLINICAL DATA:  Epigastric pain and left-sided chest pain beginning today. History of hiatal hernia.  EXAM: ACUTE ABDOMEN SERIES (ABDOMEN 2 VIEW & CHEST 1 VIEW)  COMPARISON:  Chest radiographs 05/25/2012. CT abdomen and pelvis 02/15/2012.  FINDINGS: The patient is slightly rotated to the right. Cardiac silhouette is partially obscured and appears mildly displaced rightward by a moderately large hiatal hernia with large air-fluid level, larger than on the prior radiographs. The lungs are hypoinflated with mild diffuse interstitial prominence and patchy opacities at the lung bases. No segmental airspace consolidation, overt pulmonary edema, definite  pleural effusion, or pneumothorax is identified.  There is no evidence of intraperitoneal free air. A moderate amount of stool is present in the colon. Gas is present in nondilated loops of small bowel without evidence of obstruction. Moderate S-shaped thoracolumbar scoliosis is noted.  IMPRESSION: 1. Shallow lung inflation with bibasilar densities likely reflecting subsegmental atelectasis. 2. Moderately large hiatal hernia with large air-fluid level, slightly increased in size from prior. 3. Moderate amount of colonic stool without evidence of bowel obstruction.   Electronically Signed   By: Logan Bores   On: 04/13/2014 18:03     EKG Interpretation   Date/Time:  Sunday April 13 2014 16:01:53 EST Ventricular Rate:  89 PR Interval:  181 QRS Duration: 84 QT Interval:  389 QTC Calculation: 473 R Axis:   37 Text Interpretation:  Sinus rhythm since last tracing no significant  change Confirmed by WENTZ  MD, ELLIOTT ([id number]) on 04/13/2014 4:49:39 PM      MDM   Final diagnoses:  Epigastric pain  Elevated lipase  Atypical chest pain  Hiatal hernia    80 y.o. female with epigastric pain. EKG showing sinus rhythm. Story c/w hiatal hernia issue, given her dysphagia after it began will attempt GI cocktail and get acute abd series to r/o obstruction or blockage. Will obtain labs. Doubt PE or ACS. Will reassess after GI cocktail.  6:37 PM Trop neg. CMP unremarkable. CBC with baseline anemia but otherwise WNL. Lipase 122. AAS showing hiatal hernia slightly increased from last xray, and mild colonic stool burden but no obstruction or perf. GI cocktail with full resolution of symptoms. Discussed clear liquids today and low fat diet, and outpt management of gall stones/pancreatitis, including outpt U/S. Pt prefers to go home vs getting U/S here today. Discussed case with Dr. Eulis Foster who agrees with management. Will give small amount of pain medicine. Discussed continued use of home GERD regimen. Doubt  this was cardiac in etiology, doubt need for repeat troponin. I explained the diagnosis and have given explicit precautions to return to the ER including for any other new or worsening symptoms. The patient understands and accepts the medical plan as it's been dictated and I have answered their questions. Discharge instructions concerning home care and prescriptions have been given. The patient is STABLE and is discharged to home in good condition.   BP 137/61 mmHg  Pulse 81  Temp(Src) 98.3 F (36.8 C) (Oral)  Resp 24  SpO2 93%  Meds ordered this encounter  Medications  . gi cocktail (Maalox,Lidocaine,Donnatal)    Sig:   . HYDROcodone-acetaminophen (NORCO) 5-325 MG per tablet    Sig: Take 1 tablet by mouth every 6 (six) hours as needed for severe pain.    Dispense:  6 tablet    Refill:  0    Order Specific Question:  Supervising Provider    Answer:  Johnna Acosta [address] [place], PA-C 04/13/14 1856  Richarda Blade, MD 04/13/14 [place], MD 04/13/14 2322

## 2014-04-13 NOTE — ED Provider Notes (Signed)
  Face-to-face evaluation   History: She presents for evaluation of pain that started in her epigastrium, radiating to the left upper quadrant, and right upper quadrants, today, onset while eating.  The pain has since spontaneously improved, and is almost completely resolved.  She has chronic problems with acid, and difficulty swallowing foods.  She states that she talked her GI doctor, several weeks ago, and he told her to continue her usual medicines, and follow-up with her PCP.  She has had a upper endoscopy, about 15 years ago.  She denies shortness of breath, diaphoresis, nausea, vomiting.   Physical exam: Alert, calm, cooperative.  Heart regular rate and rhythm, no murmur.  Lungs clear to auscultation.  Abdomen normal bowel sounds, soft, nontender to palpation.  Medical screening examination/treatment/procedure(s) were conducted as a shared visit with non-physician practitioner(s) and myself.  I personally evaluated the patient during the encounter  Richarda Blade, MD 04/13/14 2322

## 2014-04-13 NOTE — ED Notes (Signed)
PT ambulated to bathroom. Steady gait. Denies dizziness or light headedness. Pt returned to bed. Monitored by pulse ox, bp cuff, and 5-lead.

## 2014-04-16 DIAGNOSIS — R109 Unspecified abdominal pain: Secondary | ICD-10-CM | POA: Diagnosis not present

## 2014-04-16 DIAGNOSIS — R1310 Dysphagia, unspecified: Secondary | ICD-10-CM | POA: Diagnosis not present

## 2014-04-29 DIAGNOSIS — I10 Essential (primary) hypertension: Secondary | ICD-10-CM | POA: Diagnosis not present

## 2014-04-29 DIAGNOSIS — E139 Other specified diabetes mellitus without complications: Secondary | ICD-10-CM | POA: Diagnosis not present

## 2014-04-29 DIAGNOSIS — E780 Pure hypercholesterolemia: Secondary | ICD-10-CM | POA: Diagnosis not present

## 2014-04-29 DIAGNOSIS — E039 Hypothyroidism, unspecified: Secondary | ICD-10-CM | POA: Diagnosis not present

## 2014-05-05 DIAGNOSIS — E780 Pure hypercholesterolemia: Secondary | ICD-10-CM | POA: Diagnosis not present

## 2014-05-05 DIAGNOSIS — I10 Essential (primary) hypertension: Secondary | ICD-10-CM | POA: Diagnosis not present

## 2014-05-05 DIAGNOSIS — E039 Hypothyroidism, unspecified: Secondary | ICD-10-CM | POA: Diagnosis not present

## 2014-05-05 DIAGNOSIS — E118 Type 2 diabetes mellitus with unspecified complications: Secondary | ICD-10-CM | POA: Diagnosis not present

## 2014-05-14 DIAGNOSIS — K2950 Unspecified chronic gastritis without bleeding: Secondary | ICD-10-CM | POA: Diagnosis not present

## 2014-05-14 DIAGNOSIS — K449 Diaphragmatic hernia without obstruction or gangrene: Secondary | ICD-10-CM | POA: Diagnosis not present

## 2014-05-14 DIAGNOSIS — R1310 Dysphagia, unspecified: Secondary | ICD-10-CM | POA: Diagnosis not present

## 2014-05-14 DIAGNOSIS — R1013 Epigastric pain: Secondary | ICD-10-CM | POA: Diagnosis not present

## 2014-05-14 DIAGNOSIS — Q399 Congenital malformation of esophagus, unspecified: Secondary | ICD-10-CM | POA: Diagnosis not present

## 2014-05-14 DIAGNOSIS — K222 Esophageal obstruction: Secondary | ICD-10-CM | POA: Diagnosis not present

## 2014-06-27 ENCOUNTER — Inpatient Hospital Stay (HOSPITAL_COMMUNITY)
Admission: EM | Admit: 2014-06-27 | Discharge: 2014-07-02 | DRG: 392 | Disposition: A | Discharge status: Home / Self Care | Payer: Medicare Other | Attending: Internal Medicine | Admitting: Internal Medicine

## 2014-06-27 ENCOUNTER — Encounter (HOSPITAL_COMMUNITY): Payer: Self-pay | Admitting: *Deleted

## 2014-06-27 ENCOUNTER — Emergency Department (HOSPITAL_COMMUNITY): Payer: Medicare Other

## 2014-06-27 ENCOUNTER — Emergency Department (HOSPITAL_COMMUNITY): Admission: EM | Admit: 2014-06-27 | Discharge: 2014-06-27 | Discharge status: Absent without leave | Payer: Self-pay

## 2014-06-27 DIAGNOSIS — K449 Diaphragmatic hernia without obstruction or gangrene: Secondary | ICD-10-CM | POA: Diagnosis not present

## 2014-06-27 DIAGNOSIS — R933 Abnormal findings on diagnostic imaging of other parts of digestive tract: Secondary | ICD-10-CM | POA: Diagnosis not present

## 2014-06-27 DIAGNOSIS — M1990 Unspecified osteoarthritis, unspecified site: Secondary | ICD-10-CM | POA: Diagnosis present

## 2014-06-27 DIAGNOSIS — K440 Diaphragmatic hernia with obstruction, without gangrene: Secondary | ICD-10-CM | POA: Diagnosis present

## 2014-06-27 DIAGNOSIS — Z96651 Presence of right artificial knee joint: Secondary | ICD-10-CM | POA: Diagnosis present

## 2014-06-27 DIAGNOSIS — K5660 Unspecified intestinal obstruction: Secondary | ICD-10-CM | POA: Diagnosis not present

## 2014-06-27 DIAGNOSIS — I10 Essential (primary) hypertension: Secondary | ICD-10-CM | POA: Diagnosis present

## 2014-06-27 DIAGNOSIS — E876 Hypokalemia: Secondary | ICD-10-CM | POA: Diagnosis present

## 2014-06-27 DIAGNOSIS — E039 Hypothyroidism, unspecified: Secondary | ICD-10-CM | POA: Diagnosis present

## 2014-06-27 DIAGNOSIS — R112 Nausea with vomiting, unspecified: Secondary | ICD-10-CM | POA: Diagnosis not present

## 2014-06-27 DIAGNOSIS — R1010 Upper abdominal pain, unspecified: Secondary | ICD-10-CM

## 2014-06-27 DIAGNOSIS — Z4659 Encounter for fitting and adjustment of other gastrointestinal appliance and device: Secondary | ICD-10-CM | POA: Diagnosis not present

## 2014-06-27 DIAGNOSIS — G4733 Obstructive sleep apnea (adult) (pediatric): Secondary | ICD-10-CM | POA: Diagnosis present

## 2014-06-27 DIAGNOSIS — K5669 Other intestinal obstruction: Secondary | ICD-10-CM | POA: Diagnosis not present

## 2014-06-27 DIAGNOSIS — R079 Chest pain, unspecified: Secondary | ICD-10-CM | POA: Diagnosis not present

## 2014-06-27 DIAGNOSIS — Z66 Do not resuscitate: Secondary | ICD-10-CM | POA: Diagnosis present

## 2014-06-27 DIAGNOSIS — Z888 Allergy status to other drugs, medicaments and biological substances status: Secondary | ICD-10-CM | POA: Diagnosis not present

## 2014-06-27 DIAGNOSIS — Z882 Allergy status to sulfonamides status: Secondary | ICD-10-CM | POA: Diagnosis not present

## 2014-06-27 DIAGNOSIS — K219 Gastro-esophageal reflux disease without esophagitis: Secondary | ICD-10-CM | POA: Diagnosis present

## 2014-06-27 DIAGNOSIS — E119 Type 2 diabetes mellitus without complications: Secondary | ICD-10-CM | POA: Diagnosis present

## 2014-06-27 DIAGNOSIS — J984 Other disorders of lung: Secondary | ICD-10-CM | POA: Diagnosis not present

## 2014-06-27 DIAGNOSIS — Z880 Allergy status to penicillin: Secondary | ICD-10-CM | POA: Diagnosis not present

## 2014-06-27 DIAGNOSIS — K56609 Unspecified intestinal obstruction, unspecified as to partial versus complete obstruction: Secondary | ICD-10-CM | POA: Insufficient documentation

## 2014-06-27 DIAGNOSIS — Z7982 Long term (current) use of aspirin: Secondary | ICD-10-CM

## 2014-06-27 DIAGNOSIS — Z4682 Encounter for fitting and adjustment of non-vascular catheter: Secondary | ICD-10-CM | POA: Diagnosis not present

## 2014-06-27 DIAGNOSIS — F419 Anxiety disorder, unspecified: Secondary | ICD-10-CM | POA: Diagnosis present

## 2014-06-27 DIAGNOSIS — R0602 Shortness of breath: Secondary | ICD-10-CM | POA: Diagnosis present

## 2014-06-27 DIAGNOSIS — R1084 Generalized abdominal pain: Secondary | ICD-10-CM

## 2014-06-27 DIAGNOSIS — R109 Unspecified abdominal pain: Secondary | ICD-10-CM | POA: Diagnosis not present

## 2014-06-27 DIAGNOSIS — R05 Cough: Secondary | ICD-10-CM | POA: Diagnosis present

## 2014-06-27 DIAGNOSIS — K210 Gastro-esophageal reflux disease with esophagitis: Secondary | ICD-10-CM | POA: Diagnosis not present

## 2014-06-27 DIAGNOSIS — E785 Hyperlipidemia, unspecified: Secondary | ICD-10-CM | POA: Diagnosis present

## 2014-06-27 DIAGNOSIS — G4730 Sleep apnea, unspecified: Secondary | ICD-10-CM | POA: Diagnosis present

## 2014-06-27 DIAGNOSIS — R1013 Epigastric pain: Secondary | ICD-10-CM | POA: Diagnosis not present

## 2014-06-27 DIAGNOSIS — R1110 Vomiting, unspecified: Secondary | ICD-10-CM | POA: Diagnosis not present

## 2014-06-27 LAB — CBC
HCT: 35.6 % — ABNORMAL LOW (ref 36.0–46.0)
Hemoglobin: 11.8 g/dL — ABNORMAL LOW (ref 12.0–15.0)
MCH: 30 pg (ref 26.0–34.0)
MCHC: 33.1 g/dL (ref 30.0–36.0)
MCV: 90.6 fL (ref 78.0–100.0)
Platelets: 188 10*3/uL (ref 150–400)
RBC: 3.93 MIL/uL (ref 3.87–5.11)
RDW: 15.2 % (ref 11.5–15.5)
WBC: 6.1 10*3/uL (ref 4.0–10.5)

## 2014-06-27 LAB — COMPREHENSIVE METABOLIC PANEL
ALK PHOS: 113 U/L (ref 39–117)
ALT: 23 U/L (ref 0–35)
ANION GAP: 12 (ref 5–15)
AST: 30 U/L (ref 0–37)
Albumin: 4.1 g/dL (ref 3.5–5.2)
BUN: 20 mg/dL (ref 6–23)
CALCIUM: 9.3 mg/dL (ref 8.4–10.5)
CHLORIDE: 98 mmol/L (ref 96–112)
CO2: 27 mmol/L (ref 19–32)
Creatinine, Ser: 0.96 mg/dL (ref 0.50–1.10)
GFR calc non Af Amer: 50 mL/min — ABNORMAL LOW (ref >90)
GFR, EST AFRICAN AMERICAN: 58 mL/min — AB (ref >90)
GLUCOSE: 141 mg/dL — AB (ref 70–99)
Potassium: 3 mmol/L — ABNORMAL LOW (ref 3.5–5.1)
SODIUM: 137 mmol/L (ref 135–145)
Total Bilirubin: 0.5 mg/dL (ref 0.3–1.2)
Total Protein: 6.5 g/dL (ref 6.0–8.3)

## 2014-06-27 LAB — I-STAT TROPONIN, ED: Troponin i, poc: 0 ng/mL (ref 0.00–0.08)

## 2014-06-27 MED ORDER — POTASSIUM CHLORIDE CRYS ER 20 MEQ PO TBCR: 60.0000 meq | EXTENDED_RELEASE_TABLET | Freq: Once | ORAL | Status: DC

## 2014-06-27 NOTE — ED Notes (Addendum)
Pt reports having hx of hiatal hernia, having onset of abd pain at 3pm today. Pt reports pain all over her chest and abd and into her back. Also feels like abd is bloated.

## 2014-06-27 NOTE — ED Provider Notes (Signed)
CSN: [id number]     Arrival date & time 06/27/14  1826 History  This chart was scribed for Everlene Balls, MD by Jeanell Sparrow, ED Scribe. This patient was seen in room A10C/A10C and the patient's care was started at 11:03 PM.   Chief Complaint  Patient presents with  . Abdominal Pain  . Chest Pain   The history is provided by the patient and a relative. No language interpreter was used.   HPI Comments: Cristina Middleton is a 81 y.o. female with a hx of hiatal hernia who presents to the Emergency Department complaining of constant moderate abdominal pain that started about 8 hours ago. She states that the pain started after she ate with some chest pain, vomiting, and diarrhea. She reports that her pain feels like indigestion. She reports that the pain radiates to her back and feels like her abdomen is swollen. She states that she threw up some phlegm also multiple times, this first occurred at 3pm. Daughter states that EMS told them to give pt Xanax and she had some temporary relief. Pt denies any hx of small bowel obstruction. She also denies any fever.   PCP- Maudie Mercury  Past Medical History  Diagnosis Date  . PONV (postoperative nausea and vomiting)     YRS AGO.....(6-7 YRS)  . GERD (gastroesophageal reflux disease)     TAKES  ACIPHEX  . Diabetes mellitus without complication   . Arthritis   . Sleep apnea     WEARS MASK EVERY NOW AND THEN  . Hypertension   . Hyperthyroidism     NOT SURE WHICH ONE  . Anxiety   . Dislocated shoulder     WAS POPPED BACK IN PLACE  2006???   Past Surgical History  Procedure Laterality Date  . Breast surgery      CYST REMOVED -- LEFT  . Lipoma excision      FROM LEFT BACK  . Eye surgery      BILATERAL  . Tonsillectomy    . Total knee arthroplasty  05/25/2012    right knee  . Total knee arthroplasty  05/25/2012    Procedure: TOTAL KNEE ARTHROPLASTY;  Surgeon: Yvette Rack., MD;  Location: [facility];  Service: Orthopedics;  Laterality: Right;   Family History   Problem Relation Age of Onset  . Other Mother   . Other Father    History  Substance Use Topics  . Smoking status: Never Smoker   . Smokeless tobacco: Never Used  . Alcohol Use: No   OB History    No data available     Review of Systems 10 Systems reviewed and all are negative for acute change except as noted in the HPI.  Allergies  Other; Penicillins; and Sulfa antibiotics  Home Medications   Prior to Admission medications   Medication Sig Start Date End Date Taking? Authorizing Provider  acetaminophen (TYLENOL) 325 MG tablet Take 2 tablets (650 mg total) by mouth every 6 (six) hours as needed (or Fever >/= 101). 05/28/12   Chriss Czar, PA-C  acetaminophen (TYLENOL) 325 MG tablet Take 650 mg by mouth at bedtime.    Historical Provider, MD  ALPRAZolam Duanne Moron) 0.25 MG tablet Take 0.25 mg by mouth every morning.    Historical Provider, MD  aspirin EC 81 MG tablet Take 81 mg by mouth daily.    Historical Provider, MD  bisacodyl (DULCOLAX) 10 MG suppository Place 1 suppository (10 mg total) rectally daily as needed. 05/28/12   Vonna Kotyk  [place], PA-C  Calcium Carb-Cholecalciferol (CALCIUM 600 + D PO) Take 1 tablet by mouth daily.    Historical Provider, MD  CINNAMON PO Take 1,000 mg by mouth daily.    Historical Provider, MD  escitalopram (LEXAPRO) 20 MG tablet Take 20 mg by mouth daily.    Historical Provider, MD  HYDROcodone-acetaminophen (NORCO) 5-325 MG per tablet Take 1 tablet by mouth every 6 (six) hours as needed for severe pain. 04/13/14   Mercedes Strupp Camprubi-Soms, PA-C  indapamide (LOZOL) 2.5 MG tablet Take 2.5 mg by mouth 3 (three) times a week. 3 days a week (Sunday, Wednesday, Friday)    Historical Provider, MD  levothyroxine (SYNTHROID, LEVOTHROID) 88 MCG tablet Take 88 mcg by mouth daily before breakfast.    Historical Provider, MD  loratadine (CLARITIN) 10 MG tablet Take 10 mg by mouth daily.    Historical Provider, MD  Multiple Vitamins-Minerals (ICAPS MV) TABS  Take 1 tablet by mouth daily.    Historical Provider, MD  polyethylene glycol (MIRALAX / GLYCOLAX) packet Take 17 g by mouth daily as needed for moderate constipation.    Historical Provider, MD  RABEprazole (ACIPHEX) 20 MG tablet Take 40 mg by mouth daily.     Historical Provider, MD  simvastatin (ZOCOR) 20 MG tablet Take 20 mg by mouth at bedtime.     Historical Provider, MD  vitamin B-12 (CYANOCOBALAMIN) 500 MCG tablet Take 500 mcg by mouth daily.    Historical Provider, MD   BP 131/77 mmHg  Pulse 89  Temp(Src) 98.2 F (36.8 C) (Oral)  Resp 18  SpO2 96% Physical Exam  Constitutional: She is oriented to person, place, and time. She appears well-developed and well-nourished. No distress.  HENT:  Head: Normocephalic and atraumatic.  Nose: Nose normal.  Mouth/Throat: Oropharynx is clear and moist. No oropharyngeal exudate.  Eyes: Conjunctivae and EOM are normal. Pupils are equal, round, and reactive to light. No scleral icterus.  Neck: Normal range of motion. Neck supple. No JVD present. No tracheal deviation present. No thyromegaly present.  Cardiovascular: Normal rate, regular rhythm and normal heart sounds.  Exam reveals no gallop and no friction rub.   No murmur heard. Pulmonary/Chest: Effort normal and breath sounds normal. No respiratory distress. She has no wheezes. She exhibits no tenderness.  Abdominal: Soft. She exhibits distension. She exhibits no mass. There is tenderness. There is no rebound and no guarding.  Epigastric TTP. Heard bowel sounds in chest.   Musculoskeletal: Normal range of motion. She exhibits edema. She exhibits no tenderness.  +1 bilateral edema.   Lymphadenopathy:    She has no cervical adenopathy.  Neurological: She is alert and oriented to person, place, and time. No cranial nerve deficit. She exhibits normal muscle tone.  Skin: Skin is warm and dry. No rash noted. No erythema. No pallor.  Nursing note and vitals reviewed.   ED Course  Procedures  (including critical care time) DIAGNOSTIC STUDIES: Oxygen Saturation is 96% on RA, normal by my interpretation.    COORDINATION OF CARE: 11:07 PM- Pt advised of plan for treatment which includes radiology and labs and pt agrees.  Labs Review Labs Reviewed  CBC - Abnormal; Notable for the following:    Hemoglobin 11.8 (*)    HCT 35.6 (*)    All other components within normal limits  COMPREHENSIVE METABOLIC PANEL - Abnormal; Notable for the following:    Potassium 3.0 (*)    Glucose, Bld 141 (*)    GFR calc non Af Amer 50 (*)  GFR calc Af Amer 58 (*)    All other components within normal limits  URINALYSIS, ROUTINE W REFLEX MICROSCOPIC - Abnormal; Notable for the following:    Hgb urine dipstick TRACE (*)    Leukocytes, UA SMALL (*)    All other components within normal limits  COMPREHENSIVE METABOLIC PANEL - Abnormal; Notable for the following:    Potassium 3.2 (*)    Glucose, Bld 103 (*)    Total Protein 5.8 (*)    GFR calc non Af Amer 59 (*)    GFR calc Af Amer 68 (*)    All other components within normal limits  CBC - Abnormal; Notable for the following:    RBC 3.76 (*)    Hemoglobin 11.3 (*)    HCT 34.4 (*)    All other components within normal limits  URINE CULTURE  TROPONIN I  GLUCOSE, CAPILLARY  URINE MICROSCOPIC-ADD ON  GLUCOSE, CAPILLARY  GLUCOSE, CAPILLARY  TROPONIN I  I-STAT TROPOININ, ED    Imaging Review Dg Chest 2 View  06/27/2014   CLINICAL DATA:  Pt reports having hx of hiatal hernia, having onset of abd pain at 3pm today  EXAM: CHEST  2 VIEW  COMPARISON:  04/13/2014  FINDINGS: Cardiac silhouette is stable. There is a large hiatal hernia. There 2 air-fluid levels associated with the large hiatal hernia. These air-fluid levels are long measuring 10 and 14 cm. Bibasilar atelectasis. No pulmonary edema.  No dilated loops of large or small bowel in the upper abdomen. There is kyphosis the spine with multiple levels of endplate spurring.  IMPRESSION: Large  hiatal hernia with 2 Large air-fluid levels. No significant change from prior.   Electronically Signed   By: Suzy Bouchard M.D.   On: 06/27/2014 19:26   Ct Abdomen Pelvis W Contrast  06/28/2014   CLINICAL DATA:  Upper mid abdominal pain and back pain since 1500 hours. Nausea and vomiting. History of diabetes.  EXAM: CT ABDOMEN AND PELVIS WITH CONTRAST  TECHNIQUE: Multidetector CT imaging of the abdomen and pelvis was performed using the standard protocol following bolus administration of intravenous contrast.  CONTRAST:  144mL OMNIPAQUE IOHEXOL 300 MG/ML  SOLN  COMPARISON:  02/15/2012  FINDINGS: Atelectasis in the lung bases. Large esophageal hiatal hernia. Enteric tube is coiled in the distal esophagus. Coronary artery calcifications.  Diffuse fatty infiltration of the liver. The gallbladder, spleen, pancreas, adrenal glands, kidneys, inferior vena cava, and retroperitoneal lymph nodes are unremarkable. Calcification of aorta without aneurysm. Most of the stomach is in the hiatal hernia. Gastric malrotation is suggested. There is no evidence of gastric dilatation to suggest obstruction. No gastric wall thickening. Small bowel and colon are not abnormally distended. Gas and stool scattered throughout the colon. No free air or free fluid in the abdomen. Small umbilical hernia containing fat.  Pelvis: Diverticulosis of the sigmoid colon without evidence of diverticulitis. Appendix is surgically absent. Uterus and ovaries are not enlarged. Bladder wall is not thickened. No free or loculated pelvic fluid collections. Degenerative changes in the spine. No destructive bone lesions. Scoliosis of the thorax scoliosis of the thoracic and lumbar spine is probably degenerative.  IMPRESSION: Large esophageal hiatal hernia hour with probable gastric malrotation. No evidence of volvulus or obstruction. Enteric tube is coiled in the distal esophagus. Diffuse degenerative changes in the thoracolumbar spine with scoliosis.    Electronically Signed   By: Lucienne Capers M.D.   On: 06/28/2014 02:07   Dg Abd Portable 1v  06/28/2014  CLINICAL DATA:  NG tube placement  EXAM: PORTABLE ABDOMEN - 1 VIEW  COMPARISON:  04/13/2014  FINDINGS: There is a large esophageal hiatal hernia. The enteric tube is shown coiled in what appears to be the distal esophagus at and above the level of the hiatal hernia. Shallow inspiration with atelectasis or infiltration in the left lung base. Mild cardiac enlargement.  IMPRESSION: Enteric tube is coiled in the distal esophagus above a large esophageal hiatal hernia.   Electronically Signed   By: Lucienne Capers M.D.   On: 06/28/2014 01:07   Dg Loyce Dys Tube Plc W/fl W/rad  06/28/2014   CLINICAL DATA:  NG tube placement.  Large hiatal hernia.  EXAM: NASO G TUBE PLACEMENT WITH FL AND WITH RAD  TECHNIQUE: Fluoroscopic guidance was used by the technologist and the radiologist for NG tube insertion  CONTRAST:  30 cc Omnipaque 300 injected via the NG tube at the into the procedure  FLUOROSCOPY TIME:  Radiation Exposure Index (as provided by the fluoroscopic device):  If the device does not provide the exposure index:  Fluoroscopy Time (in minutes and seconds): 8 min 0 seconds (7 min by with technologist, 1 min by radiologist).  Number of Acquired Images:  None  COMPARISON:  Under fluoroscopic guidance, the NG tube was manipulated into the stomach within the large hiatal hernia. Despite multiple attempts and efforts, the NG tube could not be advanced through the hiatal hernia into the distal stomach or proximal small bowel. Contrast was injected at the end to verify placement in the stomach.  FINDINGS: NG tube placed into the mid portion of the stomach within the hiatal hernia. Unable to advance further.   Electronically Signed   By: Rolm Baptise M.D.   On: 06/28/2014 11:38     EKG Interpretation   Date/Time:  Friday June 27 2014 18:33:46 EST Ventricular Rate:  92 PR Interval:  184 QRS Duration:  88 QT Interval:  388 QTC Calculation: 479 R Axis:   18 Text Interpretation:  Normal sinus rhythm Normal ECG No significant change  since last tracing Confirmed by Glynn Octave [phone]) on 06/27/2014  11:00:13 PM      MDM   Final diagnoses:  None   Patient presents emergency department for abdominal pain beginning at 3 PM. She has a history of a hiatal hernia, she admits to multiple episodes of emesis at that time. Chest x-ray reveals 2 large air-fluid levels and a large hiatal hernia. This is consistent with small bowel obstruction. She denies any history of this, patient will be admitted for continued management. Patient currently does not want anything for pain.  I also consulted surgery for the hospitalists.  CT scan is currently pending, NG tube was placed.     I personally performed the services described in this documentation, which was scribed in my presence. The recorded information has been reviewed and is accurate.     Everlene Balls, MD 06/28/14 [phone]

## 2014-06-27 NOTE — ED Notes (Signed)
Yellow colored watch removed from left wrist and given to daughter sylvia at bedside

## 2014-06-28 ENCOUNTER — Inpatient Hospital Stay (HOSPITAL_COMMUNITY): Payer: Medicare Other

## 2014-06-28 ENCOUNTER — Encounter (HOSPITAL_COMMUNITY): Payer: Self-pay | Admitting: Radiology

## 2014-06-28 DIAGNOSIS — Z4659 Encounter for fitting and adjustment of other gastrointestinal appliance and device: Secondary | ICD-10-CM

## 2014-06-28 DIAGNOSIS — K210 Gastro-esophageal reflux disease with esophagitis: Secondary | ICD-10-CM

## 2014-06-28 DIAGNOSIS — E119 Type 2 diabetes mellitus without complications: Secondary | ICD-10-CM

## 2014-06-28 DIAGNOSIS — E039 Hypothyroidism, unspecified: Secondary | ICD-10-CM

## 2014-06-28 DIAGNOSIS — R1013 Epigastric pain: Secondary | ICD-10-CM

## 2014-06-28 DIAGNOSIS — R0602 Shortness of breath: Secondary | ICD-10-CM | POA: Diagnosis present

## 2014-06-28 DIAGNOSIS — R109 Unspecified abdominal pain: Secondary | ICD-10-CM | POA: Diagnosis present

## 2014-06-28 DIAGNOSIS — K5669 Other intestinal obstruction: Secondary | ICD-10-CM

## 2014-06-28 LAB — TROPONIN I: Troponin I: <0.03 ng/mL (ref <0.031)

## 2014-06-28 LAB — CBC
HCT: 34.4 % — ABNORMAL LOW (ref 36.0–46.0)
HEMOGLOBIN: 11.3 g/dL — AB (ref 12.0–15.0)
MCH: 30.1 pg (ref 26.0–34.0)
MCHC: 32.8 g/dL (ref 30.0–36.0)
MCV: 91.5 fL (ref 78.0–100.0)
PLATELETS: 172 10*3/uL (ref 150–400)
RBC: 3.76 MIL/uL — ABNORMAL LOW (ref 3.87–5.11)
RDW: 15.2 % (ref 11.5–15.5)
WBC: 4.3 10*3/uL (ref 4.0–10.5)

## 2014-06-28 LAB — GLUCOSE, CAPILLARY
GLUCOSE-CAPILLARY: 96 mg/dL (ref 70–99)
GLUCOSE-CAPILLARY: 86 mg/dL (ref 70–99)
Glucose-Capillary: 97 mg/dL (ref 70–99)
Glucose-Capillary: 95 mg/dL (ref 70–99)
Glucose-Capillary: 76 mg/dL (ref 70–99)

## 2014-06-28 LAB — URINALYSIS, ROUTINE W REFLEX MICROSCOPIC
Bilirubin Urine: NEGATIVE
Glucose, UA: NEGATIVE mg/dL
KETONES UR: NEGATIVE mg/dL
NITRITE: NEGATIVE
PH: 5.5 (ref 5.0–8.0)
PROTEIN: NEGATIVE mg/dL
SPECIFIC GRAVITY, URINE: 1.027 (ref 1.005–1.030)
Urobilinogen, UA: 0.2 mg/dL (ref 0.0–1.0)

## 2014-06-28 LAB — COMPREHENSIVE METABOLIC PANEL
ALBUMIN: 3.5 g/dL (ref 3.5–5.2)
ALK PHOS: 100 U/L (ref 39–117)
ALT: 20 U/L (ref 0–35)
ANION GAP: 10 (ref 5–15)
AST: 24 U/L (ref 0–37)
BUN: 14 mg/dL (ref 6–23)
CALCIUM: 8.8 mg/dL (ref 8.4–10.5)
CHLORIDE: 98 mmol/L (ref 96–112)
CO2: 30 mmol/L (ref 19–32)
CREATININE: 0.84 mg/dL (ref 0.50–1.10)
GFR calc non Af Amer: 59 mL/min — ABNORMAL LOW (ref >90)
GFR, EST AFRICAN AMERICAN: 68 mL/min — AB (ref >90)
Glucose, Bld: 103 mg/dL — ABNORMAL HIGH (ref 70–99)
Potassium: 3.2 mmol/L — ABNORMAL LOW (ref 3.5–5.1)
SODIUM: 138 mmol/L (ref 135–145)
TOTAL PROTEIN: 5.8 g/dL — AB (ref 6.0–8.3)
Total Bilirubin: 0.4 mg/dL (ref 0.3–1.2)

## 2014-06-28 LAB — URINE MICROSCOPIC-ADD ON

## 2014-06-28 MED ORDER — FENTANYL CITRATE 0.05 MG/ML IJ SOLN
25.0000 ug | Freq: Once | INTRAMUSCULAR | Status: AC
  Administered 2014-06-28: 25 ug via INTRAVENOUS
  Filled 2014-06-28: qty 2

## 2014-06-28 MED ORDER — HEPARIN SODIUM (PORCINE) 5000 UNIT/ML IJ SOLN
5000.0000 [IU] | Freq: Three times a day (TID) | INTRAMUSCULAR | Status: DC
  Administered 2014-06-28 – 2014-07-02 (×13): 5000 [IU] via SUBCUTANEOUS
  Filled 2014-06-28 (×16): qty 1

## 2014-06-28 MED ORDER — POLYETHYLENE GLYCOL 3350 17 G PO PACK
17.0000 g | PACK | Freq: Every day | ORAL | Status: DC | PRN
  Filled 2014-06-28: qty 1

## 2014-06-28 MED ORDER — ONDANSETRON HCL 4 MG PO TABS: 4.0000 mg | ORAL_TABLET | Freq: Four times a day (QID) | ORAL | Status: DC | PRN

## 2014-06-28 MED ORDER — OCUVITE PO TABS
1.0000 | ORAL_TABLET | Freq: Every day | ORAL | Status: DC
  Filled 2014-06-28: qty 1

## 2014-06-28 MED ORDER — ALPRAZOLAM 0.25 MG PO TABS: 0.2500 mg | ORAL_TABLET | Freq: Every morning | ORAL | Status: DC

## 2014-06-28 MED ORDER — LEVOTHYROXINE SODIUM 88 MCG PO TABS
88.0000 ug | ORAL_TABLET | Freq: Every day | ORAL | Status: DC
  Filled 2014-06-28 (×3): qty 1

## 2014-06-28 MED ORDER — SIMVASTATIN 20 MG PO TABS
20.0000 mg | ORAL_TABLET | Freq: Every day | ORAL | Status: DC
  Administered 2014-06-30 – 2014-07-01 (×2): 20 mg via ORAL
  Filled 2014-06-28 (×5): qty 1

## 2014-06-28 MED ORDER — ACETAMINOPHEN 325 MG PO TABS: 650.0000 mg | ORAL_TABLET | Freq: Four times a day (QID) | ORAL | Status: DC | PRN

## 2014-06-28 MED ORDER — IOHEXOL 300 MG/ML  SOLN
100.0000 mL | Freq: Once | INTRAMUSCULAR | Status: AC | PRN
  Administered 2014-06-28: 100 mL via INTRAVENOUS

## 2014-06-28 MED ORDER — ACETAMINOPHEN 325 MG PO TABS
650.0000 mg | ORAL_TABLET | Freq: Every day | ORAL | Status: DC
  Administered 2014-06-30 – 2014-07-01 (×2): 650 mg via ORAL
  Filled 2014-06-28 (×2): qty 2

## 2014-06-28 MED ORDER — MORPHINE SULFATE 2 MG/ML IJ SOLN
1.0000 mg | INTRAMUSCULAR | Status: DC | PRN
  Administered 2014-06-28 (×2): 1 mg via INTRAVENOUS
  Filled 2014-06-28 (×2): qty 1

## 2014-06-28 MED ORDER — LORATADINE 10 MG PO TABS
10.0000 mg | ORAL_TABLET | Freq: Every day | ORAL | Status: DC
  Filled 2014-06-28: qty 1

## 2014-06-28 MED ORDER — HYDRALAZINE HCL 20 MG/ML IJ SOLN: 5.0000 mg | INTRAMUSCULAR | Status: DC | PRN

## 2014-06-28 MED ORDER — SODIUM CHLORIDE 0.9 % IV SOLN
INTRAVENOUS | Status: DC
  Administered 2014-06-28 – 2014-06-29 (×3): via INTRAVENOUS

## 2014-06-28 MED ORDER — PANTOPRAZOLE SODIUM 40 MG IV SOLR
40.0000 mg | Freq: Two times a day (BID) | INTRAVENOUS | Status: DC
  Administered 2014-06-28 – 2014-07-01 (×7): 40 mg via INTRAVENOUS
  Filled 2014-06-28 (×8): qty 40

## 2014-06-28 MED ORDER — ASPIRIN EC 81 MG PO TBEC
81.0000 mg | DELAYED_RELEASE_TABLET | Freq: Every day | ORAL | Status: DC
Start: 2014-06-28 — End: 2014-06-28
  Filled 2014-06-28: qty 1

## 2014-06-28 MED ORDER — ESCITALOPRAM OXALATE 20 MG PO TABS
20.0000 mg | ORAL_TABLET | Freq: Every day | ORAL | Status: DC
  Filled 2014-06-28: qty 1

## 2014-06-28 MED ORDER — CINNAMON 500 MG PO CAPS: 1000.0000 mg | ORAL_CAPSULE | Freq: Every day | ORAL | Status: DC

## 2014-06-28 MED ORDER — ACETAMINOPHEN 650 MG RE SUPP
650.0000 mg | Freq: Four times a day (QID) | RECTAL | Status: DC | PRN
  Administered 2014-06-28: 650 mg via RECTAL
  Filled 2014-06-28: qty 1

## 2014-06-28 MED ORDER — LEVOTHYROXINE SODIUM 100 MCG IV SOLR
44.0000 ug | Freq: Every day | INTRAVENOUS | Status: DC
  Administered 2014-06-28 – 2014-07-01 (×4): 44 ug via INTRAVENOUS
  Filled 2014-06-28 (×4): qty 5

## 2014-06-28 MED ORDER — LIDOCAINE VISCOUS 2 % MT SOLN
15.0000 mL | Freq: Once | OROMUCOSAL | Status: AC
  Administered 2014-06-28: 15 mL via OROMUCOSAL
  Filled 2014-06-28: qty 15

## 2014-06-28 MED ORDER — CALCIUM CARBONATE-VITAMIN D 500-200 MG-UNIT PO TABS
1.0000 | ORAL_TABLET | Freq: Every day | ORAL | Status: DC
  Filled 2014-06-28: qty 1

## 2014-06-28 MED ORDER — PANTOPRAZOLE SODIUM 40 MG PO TBEC: 40.0000 mg | DELAYED_RELEASE_TABLET | Freq: Every day | ORAL | Status: DC

## 2014-06-28 MED ORDER — POTASSIUM CHLORIDE 10 MEQ/100ML IV SOLN
10.0000 meq | Freq: Once | INTRAVENOUS | Status: AC
  Administered 2014-06-28: 10 meq via INTRAVENOUS
  Filled 2014-06-28: qty 100

## 2014-06-28 MED ORDER — ONDANSETRON HCL 4 MG/2ML IJ SOLN
4.0000 mg | Freq: Four times a day (QID) | INTRAMUSCULAR | Status: DC | PRN
  Administered 2014-06-30: 4 mg via INTRAVENOUS
  Filled 2014-06-28: qty 2

## 2014-06-28 MED ORDER — INSULIN ASPART 100 UNIT/ML ~~LOC~~ SOLN
0.0000 [IU] | Freq: Three times a day (TID) | SUBCUTANEOUS | Status: DC
Start: 2014-06-28 — End: 2014-07-02
  Administered 2014-06-30 (×3): 1 [IU] via SUBCUTANEOUS

## 2014-06-28 MED ORDER — FLUTICASONE PROPIONATE 50 MCG/ACT NA SUSP
2.0000 | Freq: Every day | NASAL | Status: DC
  Administered 2014-06-29 – 2014-07-02 (×4): 2 via NASAL
  Filled 2014-06-28: qty 16

## 2014-06-28 NOTE — ED Notes (Signed)
CT advised pt is ready to go for scan at this time.

## 2014-06-28 NOTE — Evaluation (Signed)
Physical Therapy Evaluation Patient Details Name: Cristina Middleton MRN: [id number] DOB: [birth year] Today's Date: 06/28/2014   History of Present Illness  Cristina Middleton is a 81 y.o. female with past medical history of hypertension, hiatal hernia, GERD, hypothyroidism, diabetes mellitus, OSA, who presents with abdominal pain.  Clinical Impression  Pt admitted with above diagnosis. Pt currently with functional limitations due to the deficits listed below (see PT Problem List). Pt ambulated 33' with min HHA, fatigued from multiple procedures, lacks activity tolerance for community ambulation. WIll follow acutely but do not expect she will need f/u at d/c. Pt will benefit from skilled PT to increase their independence and safety with mobility to allow discharge to the venue listed below.       Follow Up Recommendations No PT follow up    Equipment Recommendations  None recommended by PT    Recommendations for Other Services       Precautions / Restrictions Precautions Precautions: None Restrictions Weight Bearing Restrictions: No      Mobility  Bed Mobility Overal bed mobility: Needs Assistance Bed Mobility: Supine to Sit;Sit to Supine     Supine to sit: Supervision Sit to supine: Min assist   General bed mobility comments: able to get OOB without physical assist but needed min A for legs into bed with sit to supine  Transfers Overall transfer level: Needs assistance Equipment used: None Transfers: Sit to/from Stand Sit to Stand: Supervision         General transfer comment: uses momentum to rise, close supervision for safety, but no LOB  Ambulation/Gait Ambulation/Gait assistance: Min assist Ambulation Distance (Feet): 80 Feet Assistive device: 1 person hand held assist Gait Pattern/deviations: Step-through pattern;Decreased weight shift to left;Decreased stance time - left Gait velocity: decreased   General Gait Details: antalgia due to left knee. Pt ambulated  with HHA due to not having cane here. No LOB, only HHA needed. Recommended to family that they ambulate with her tomorrow 2-4x. Pt with decreased activity tolerance from past few days in bed  Stairs            Wheelchair Mobility    Modified Rankin (Stroke Patients Only)       Balance Overall balance assessment: Needs assistance Sitting-balance support: No upper extremity supported Sitting balance-Leahy Scale: Good     Standing balance support: No upper extremity supported;During functional activity Standing balance-Leahy Scale: Good                               Pertinent Vitals/Pain Pain Assessment: Faces Faces Pain Scale: Hurts little more Pain Location: throat from NG tube placement x2 Pain Descriptors / Indicators: Aching Pain Intervention(s): Monitored during session  O2 sats 88% on RA after ambulation, returned to 94% within 2 mins rest    Home Living Family/patient expects to be discharged to:: Private residence Living Arrangements: Alone Available Help at Discharge: Family;Available PRN/intermittently Type of Home: House Home Access: Stairs to enter   Entrance Stairs-Number of Steps: 3 Home Layout: One level Home Equipment: Cane - single point;Walker - 2 wheels;Wheelchair - manual;Bedside commode Additional Comments: pt's daughter and son both live nearby and grandson building a house next door    Prior Function Level of Independence: Independent with assistive device(s)         Comments: uses cane     Hand Dominance        Extremity/Trunk Assessment   Upper Extremity Assessment: Defer  to OT evaluation           Lower Extremity Assessment: Generalized weakness;LLE deficits/detail   LLE Deficits / Details: pt needs TKA on left but reports that she had such trouble with the anesthesia with the right that she won't have the left done. Knee ext 4-/5, knee flex 4/5  Cervical / Trunk Assessment: Kyphotic  Communication    Communication: No difficulties  Cognition Arousal/Alertness: Awake/alert Behavior During Therapy: WFL for tasks assessed/performed Overall Cognitive Status: History of cognitive impairments - at baseline       Memory: Decreased short-term memory              General Comments General comments (skin integrity, edema, etc.): pt fatigued today from placement of 2nd NG tube    Exercises        Assessment/Plan    PT Assessment Patient needs continued PT services  PT Diagnosis Difficulty walking;Generalized weakness;Abnormality of gait;Acute pain   PT Problem List Decreased strength;Decreased activity tolerance;Decreased balance;Decreased mobility;Pain  PT Treatment Interventions DME instruction;Gait training;Stair training;Functional mobility training;Therapeutic activities;Therapeutic exercise;Balance training;Patient/family education   PT Goals (Current goals can be found in the Care Plan section) Acute Rehab PT Goals Patient Stated Goal: return home PT Goal Formulation: With patient Time For Goal Achievement: 07/12/14 Potential to Achieve Goals: Good    Frequency Min 3X/week   Barriers to discharge        Co-evaluation               End of Session Equipment Utilized During Treatment: Gait belt Activity Tolerance: Patient limited by fatigue Patient left: in bed;with call bell/phone within reach;with family/visitor present Nurse Communication: Mobility status         Time: 1349-1417 PT Time Calculation (min) (ACUTE ONLY): 28 min   Charges:   PT Evaluation $Initial PT Evaluation Tier I: 1 Procedure PT Treatments $Gait Training: 8-22 mins   PT G Codes:       Cristina Middleton, PT  Acute Rehab Services  [phone]  Cristina Middleton 06/28/2014, 3:25 PM

## 2014-06-28 NOTE — Progress Notes (Signed)
NURSING PROGRESS NOTE  Cristina Middleton [id number] Admission Data: 06/28/2014 4:00 AM Attending Provider: Ivor Costa, MD PCP:KIM, Jeneen Rinks, MD Code Status: DNR  Cristina Middleton is a 81 y.o. female patient admitted from ED:  -No acute distress noted.  -No complaints of shortness of breath.  -No complaints of chest pain.   Cardiac Monitoring: Box # 4 in place. Cardiac monitor yields:normal sinus rhythm.  Blood pressure 158/76, pulse 84, temperature 97.5 F (36.4 C), temperature source Oral, resp. rate 18, height 5\' 4"  (1.626 m), weight 69.3 kg (152 lb 12.5 oz), SpO2 97 %.   IV Fluids:  IV in place, occlusive dsg intact without redness, IV cath forearm left, condition patent and no redness normal saline.   Allergies:  Other; Penicillins; and Sulfa antibiotics  Past Medical History:   has a past medical history of PONV (postoperative nausea and vomiting); GERD (gastroesophageal reflux disease); Diabetes mellitus without complication; Arthritis; Sleep apnea; Hypertension; Hyperthyroidism; Anxiety; and Dislocated shoulder.  Past Surgical History:   has past surgical history that includes Breast surgery; Lipoma excision; Eye surgery; Tonsillectomy; Total knee arthroplasty (05/25/2012); and Total knee arthroplasty (05/25/2012).   Skin: bruising noted to bilateral arms, otherwise skin intact  Patient/Family orientated to room. Information packet given to patient/family. Admission inpatient armband information verified with patient/family to include name and date of birth and placed on patient arm. Side rails up x 2, fall assessment and education completed with patient/family. Patient/family able to verbalize understanding of risk associated with falls and verbalized understanding to call for assistance before getting out of bed. Call light within reach. Patient/family able to voice and demonstrate understanding of unit orientation instructions.    Will continue to evaluate and treat per MD  orders.  Wallie Renshaw, RN

## 2014-06-28 NOTE — Consult Note (Signed)
Reason for Consult: Large hiatal hernia Referring Physician: Dr. Morton Peters Cristina Middleton is an 81 y.o. female.  HPI: Cristina Middleton has a long history of a large hiatal hernia. This is followed by Dr.Magod at [place]. She had some increased pain with it in December of last year which resolved with some outpatient treatment. Yesterday afternoon, she developed worsening pain in the epigastric region extending to her back. She spoke with her family and came via EMS to the emergency room. Plain films have revealed this chronic, large hiatal hernia with air-fluid levels. Her pain has subsided somewhat but not completely gone away. She is being admitted to medical service. I was asked to see her from a surgical standpoint. Nasogastric tube has been placed.  Past Medical History  Diagnosis Date  . PONV (postoperative nausea and vomiting)     YRS AGO.....(6-7 YRS)  . GERD (gastroesophageal reflux disease)     TAKES  ACIPHEX  . Diabetes mellitus without complication   . Arthritis   . Sleep apnea     WEARS MASK EVERY NOW AND THEN  . Hypertension   . Hyperthyroidism     NOT SURE WHICH ONE  . Anxiety   . Dislocated shoulder     WAS POPPED BACK IN PLACE  2006???    Past Surgical History  Procedure Laterality Date  . Breast surgery      CYST REMOVED -- LEFT  . Lipoma excision      FROM LEFT BACK  . Eye surgery      BILATERAL  . Tonsillectomy    . Total knee arthroplasty  05/25/2012    right knee  . Total knee arthroplasty  05/25/2012    Procedure: TOTAL KNEE ARTHROPLASTY;  Surgeon: Yvette Rack., MD;  Location: [place];  Service: Orthopedics;  Laterality: Right;    Family History  Problem Relation Age of Onset  . Other Mother   . Other Father     Social History:  reports that she has never smoked. She has never used smokeless tobacco. She reports that she does not drink alcohol or use illicit drugs.  Allergies:  Allergies  Allergen Reactions  . Other Other (See Comments)    Alka Seltzer  dissolving tabs-Possibly hallucinations  . Penicillins Hives, Itching and Swelling  . Sulfa Antibiotics Hives, Itching and Swelling    Medications: Prior to Admission:  (Not in a hospital admission)  Results for orders placed or performed during the hospital encounter of 06/27/14 (from the past 48 hour(s))  CBC     Status: Abnormal   Collection Time: 06/27/14  6:32 PM  Result Value Ref Range   WBC 6.1 4.0 - 10.5 K/uL   RBC 3.93 3.87 - 5.11 MIL/uL   Hemoglobin 11.8 (L) 12.0 - 15.0 g/dL   HCT 35.6 (L) 36.0 - 46.0 %   MCV 90.6 78.0 - 100.0 fL   MCH 30.0 26.0 - 34.0 pg   MCHC 33.1 30.0 - 36.0 g/dL   RDW 15.2 11.5 - 15.5 %   Platelets 188 150 - 400 K/uL  Comprehensive metabolic panel     Status: Abnormal   Collection Time: 06/27/14  6:32 PM  Result Value Ref Range   Sodium 137 135 - 145 mmol/L   Potassium 3.0 (L) 3.5 - 5.1 mmol/L   Chloride 98 96 - 112 mmol/L   CO2 27 19 - 32 mmol/L   Glucose, Bld 141 (H) 70 - 99 mg/dL   BUN 20 6 -  23 mg/dL   Creatinine, Ser 0.96 0.50 - 1.10 mg/dL   Calcium 9.3 8.4 - 10.5 mg/dL   Total Protein 6.5 6.0 - 8.3 g/dL   Albumin 4.1 3.5 - 5.2 g/dL   AST 30 0 - 37 U/L   ALT 23 0 - 35 U/L   Alkaline Phosphatase 113 39 - 117 U/L   Total Bilirubin 0.5 0.3 - 1.2 mg/dL   GFR calc non Af Amer 50 (L) >90 mL/min   GFR calc Af Amer 58 (L) >90 mL/min    Comment: (NOTE) The eGFR has been calculated using the CKD EPI equation. This calculation has not been validated in all clinical situations. eGFR's persistently <90 mL/min signify possible Chronic Kidney Disease.    Anion gap 12 5 - 15  I-stat troponin, ED (not at [facility])     Status: None   Collection Time: 06/27/14  6:48 PM  Result Value Ref Range   Troponin i, poc 0.00 0.00 - 0.08 ng/mL   Comment 3            Comment: Due to the release kinetics of cTnI, a negative result within the first hours of the onset of symptoms does not rule out myocardial infarction with certainty. If myocardial infarction is  still suspected, repeat the test at appropriate intervals.     Dg Chest 2 View  06/27/2014   CLINICAL DATA:  Pt reports having hx of hiatal hernia, having onset of abd pain at 3pm today  EXAM: CHEST  2 VIEW  COMPARISON:  04/13/2014  FINDINGS: Cardiac silhouette is stable. There is a large hiatal hernia. There 2 air-fluid levels associated with the large hiatal hernia. These air-fluid levels are long measuring 10 and 14 cm. Bibasilar atelectasis. No pulmonary edema.  No dilated loops of large or small bowel in the upper abdomen. There is kyphosis the spine with multiple levels of endplate spurring.  IMPRESSION: Large hiatal hernia with 2 Large air-fluid levels. No significant change from prior.   Electronically Signed   By: Suzy Bouchard M.D.   On: 06/27/2014 19:26    Review of Systems  Constitutional: Negative for fever and chills.  HENT:       Nasogastric tube  Eyes: Negative.   Respiratory: Negative.   Cardiovascular: Negative.   Gastrointestinal: Positive for abdominal pain.  Genitourinary: Negative.   Musculoskeletal: Negative.   Skin: Negative.   Neurological: Negative.   Endo/Heme/Allergies: Negative.   Psychiatric/Behavioral: Negative.    Blood pressure 165/79, pulse 86, temperature 98.2 F (36.8 C), temperature source Oral, resp. rate 19, SpO2 93 %. Physical Exam  Constitutional: She appears well-developed. No distress.  HENT:  Head: Normocephalic.  Right Ear: External ear normal.  Left Ear: External ear normal.  Nose: Nose normal.  Mouth/Throat: Oropharynx is clear and moist.  Nasogastric tube  Neck: Normal range of motion. Neck supple.  Cardiovascular: Normal rate, regular rhythm and normal heart sounds.   Respiratory: Breath sounds normal. No respiratory distress. She has no wheezes. She has no rales.  GI: Soft. She exhibits no distension. There is no tenderness. There is no rebound and no guarding.  Very active bowel sounds, no tenderness  Musculoskeletal: She  exhibits no tenderness.  Neurological: She is alert. She exhibits normal muscle tone.  Skin: Skin is warm and dry.  Psychiatric: She has a normal mood and affect.    Assessment/Plan: Large chronic hiatal hernia, now more symptomatic. Agree with medical admission and placement of nasogastric tube. CT scan is  pending. Also recommend GI consultation. She is followed by Dr. Watt Climes from [place]. We will follow.  Isiaih Hollenbach E 06/28/2014, 12:42 AM

## 2014-06-28 NOTE — H&P (Signed)
[facility] History and Physical  Cristina Middleton LKG:[id number] DOB: 12-02-1920 DOA: 06/27/2014  Referring physician: ED physician PCP: Jani Gravel, MD  Specialists:    Chief Complaint: abdominal pain  HPI: Cristina Middleton is a 81 y.o. female with past medical history of hypertension, hiatal hernia, GERD, hypothyroidism, diabetes mellitus, OSA, who presents with abdominal pain.  He reports that she started having abdominal pain at about 3 PM. It is located in epigastric area. She states that the pain started after she ate. She took Xanax per EMS instruction with partial relief. She has nausea and vomited phlegm. No diarrhea. No fever or chills. She also has some mild chest pain. She reports that her pain feels like indigestion. She reports mild burning on urination, but no dysuria. Patient denies cough, diarrhea, skin rashes, joint pain or leg swelling. No unilateral weakness, numbness or tingling sensations. No vision change or hearing loss.  In ED, patient was found to have small bowel obstruction on chest x-ray. No leukocytosis, potassium 3.0, negative troponin, temperature normal. She is admitted to inpatient for further evaluation treatment. General surgery was consulted by ED.  Review of Systems: As presented in the history of presenting illness, rest negative.  Where does patient live?  At home Can patient participate in ADLs? little  Allergy:  Allergies  Allergen Reactions  . Other Other (See Comments)    Alka Seltzer dissolving tabs-Possibly hallucinations  . Penicillins Hives, Itching and Swelling  . Sulfa Antibiotics Hives, Itching and Swelling    Past Medical History  Diagnosis Date  . PONV (postoperative nausea and vomiting)     YRS AGO.....(6-7 YRS)  . GERD (gastroesophageal reflux disease)     TAKES  ACIPHEX  . Diabetes mellitus without complication   . Arthritis   . Sleep apnea     WEARS MASK EVERY NOW AND THEN  . Hypertension   . Hyperthyroidism     NOT  SURE WHICH ONE  . Anxiety   . Dislocated shoulder     WAS POPPED BACK IN PLACE  2006???    Past Surgical History  Procedure Laterality Date  . Breast surgery      CYST REMOVED -- LEFT  . Lipoma excision      FROM LEFT BACK  . Eye surgery      BILATERAL  . Tonsillectomy    . Total knee arthroplasty  05/25/2012    right knee  . Total knee arthroplasty  05/25/2012    Procedure: TOTAL KNEE ARTHROPLASTY;  Surgeon: Yvette Rack., MD;  Location: [place];  Service: Orthopedics;  Laterality: Right;    Social History:  reports that she has never smoked. She has never used smokeless tobacco. She reports that she does not drink alcohol or use illicit drugs.  Family History:  Family History  Problem Relation Age of Onset  . Other Mother   . Other Father      Prior to Admission medications   Medication Sig Start Date End Date Taking? Authorizing Provider  acetaminophen (TYLENOL) 325 MG tablet Take 650 mg by mouth at bedtime.   Yes Historical Provider, MD  ALPRAZolam (XANAX) 0.25 MG tablet Take 0.25 mg by mouth every morning.   Yes Historical Provider, MD  aspirin EC 81 MG tablet Take 81 mg by mouth daily.   Yes Historical Provider, MD  Calcium Carb-Cholecalciferol (CALCIUM 600 + D PO) Take 1 tablet by mouth daily.   Yes Historical Provider, MD  CINNAMON PO Take 1,000 mg  by mouth daily.   Yes Historical Provider, MD  escitalopram (LEXAPRO) 20 MG tablet Take 20 mg by mouth daily.   Yes Historical Provider, MD  indapamide (LOZOL) 2.5 MG tablet Take 2.5 mg by mouth 3 (three) times a week. 3 days a week (Sunday, Wednesday, Friday)   Yes Historical Provider, MD  L-Methylfolate-B12-B6-B2 (CEREFOLIN PO) Take 1 tablet by mouth daily.   Yes Historical Provider, MD  levothyroxine (SYNTHROID, LEVOTHROID) 88 MCG tablet Take 88 mcg by mouth daily before breakfast.   Yes Historical Provider, MD  loratadine (CLARITIN) 10 MG tablet Take 10 mg by mouth daily.   Yes Historical Provider, MD  metFORMIN  (GLUCOPHAGE) 500 MG tablet Take 250 mg by mouth daily with breakfast.   Yes Historical Provider, MD  Multiple Vitamins-Minerals (ICAPS MV) TABS Take 1 tablet by mouth daily.   Yes Historical Provider, MD  polyethylene glycol (MIRALAX / GLYCOLAX) packet Take 17 g by mouth daily as needed for moderate constipation.   Yes Historical Provider, MD  RABEprazole (ACIPHEX) 20 MG tablet Take 40 mg by mouth daily.    Yes Historical Provider, MD  simvastatin (ZOCOR) 20 MG tablet Take 20 mg by mouth at bedtime.    Yes Historical Provider, MD  vitamin B-12 (CYANOCOBALAMIN) 500 MCG tablet Take 500 mcg by mouth daily.   Yes Historical Provider, MD  acetaminophen (TYLENOL) 325 MG tablet Take 2 tablets (650 mg total) by mouth every 6 (six) hours as needed (or Fever >/= 101). Patient not taking: Reported on 06/27/2014 05/28/12   Chriss Czar, PA-C  bisacodyl (DULCOLAX) 10 MG suppository Place 1 suppository (10 mg total) rectally daily as needed. Patient not taking: Reported on 06/27/2014 05/28/12   Chriss Czar, PA-C  HYDROcodone-acetaminophen (NORCO) 5-325 MG per tablet Take 1 tablet by mouth every 6 (six) hours as needed for severe pain. Patient not taking: Reported on 06/27/2014 04/13/14   Patty Sermons Camprubi-Soms, PA-C    Physical Exam: Filed Vitals:   06/28/14 0100 06/28/14 0200 06/28/14 0235 06/28/14 0548  BP: 155/71 143/69 158/76 146/62  Pulse: 87 85 84 74  Temp:   97.5 F (36.4 C) 97.7 F (36.5 C)  TempSrc:   Oral Oral  Resp: 17 29 18 18   Height:   5\' 4"  (1.626 m)   Weight:   69.3 kg (152 lb 12.5 oz)   SpO2: 92% 92% 97% 96%   General: Not in acute distress HEENT:       Eyes: PERRL, EOMI, no scleral icterus       ENT: No discharge from the ears and nose, no pharynx injection, no tonsillar enlargement.        Neck: No JVD, no bruit, no mass felt. Cardiac: S1/S2, RRR, No murmurs, No gallops or rubs Pulm: Good air movement bilaterally. Clear to auscultation bilaterally. No rales, wheezing,  rhonchi or rubs. Abd: Soft, distended, mild and diffused tenderness, no rebound pain, no organomegaly, BS present Ext: No edema bilaterally. 2+DP/PT pulse bilaterally Musculoskeletal: No joint deformities, erythema, or stiffness, ROM full Skin: No rashes.  Neuro: Alert and oriented X3, cranial nerves II-XII grossly intact, muscle strength 5/5 in all extremeties, sensation to light touch intact.  Psych: Patient is not psychotic, no suicidal or hemocidal ideation.  Labs on Admission:  Basic Metabolic Panel:  Recent Labs Lab 06/27/14 1832  NA 137  K 3.0*  CL 98  CO2 27  GLUCOSE 141*  BUN 20  CREATININE 0.96  CALCIUM 9.3   Liver Function Tests:  Recent  Labs Lab 06/27/14 1832  AST 30  ALT 23  ALKPHOS 113  BILITOT 0.5  PROT 6.5  ALBUMIN 4.1   No results for input(s): LIPASE, AMYLASE in the last 168 hours. No results for input(s): AMMONIA in the last 168 hours. CBC:  Recent Labs Lab 06/27/14 1832  WBC 6.1  HGB 11.8*  HCT 35.6*  MCV 90.6  PLT 188   Cardiac Enzymes: No results for input(s): CKTOTAL, CKMB, CKMBINDEX, TROPONINI in the last 168 hours.  BNP (last 3 results) No results for input(s): BNP in the last 8760 hours.  ProBNP (last 3 results) No results for input(s): PROBNP in the last 8760 hours.  CBG:  Recent Labs Lab 06/28/14 0243  GLUCAP 97    Radiological Exams on Admission: Dg Chest 2 View  06/27/2014   CLINICAL DATA:  Pt reports having hx of hiatal hernia, having onset of abd pain at 3pm today  EXAM: CHEST  2 VIEW  COMPARISON:  04/13/2014  FINDINGS: Cardiac silhouette is stable. There is a large hiatal hernia. There 2 air-fluid levels associated with the large hiatal hernia. These air-fluid levels are long measuring 10 and 14 cm. Bibasilar atelectasis. No pulmonary edema.  No dilated loops of large or small bowel in the upper abdomen. There is kyphosis the spine with multiple levels of endplate spurring.  IMPRESSION: Large hiatal hernia with 2  Large air-fluid levels. No significant change from prior.   Electronically Signed   By: Suzy Bouchard M.D.   On: 06/27/2014 19:26   Ct Abdomen Pelvis W Contrast  06/28/2014   CLINICAL DATA:  Upper mid abdominal pain and back pain since 1500 hours. Nausea and vomiting. History of diabetes.  EXAM: CT ABDOMEN AND PELVIS WITH CONTRAST  TECHNIQUE: Multidetector CT imaging of the abdomen and pelvis was performed using the standard protocol following bolus administration of intravenous contrast.  CONTRAST:  156mL OMNIPAQUE IOHEXOL 300 MG/ML  SOLN  COMPARISON:  02/15/2012  FINDINGS: Atelectasis in the lung bases. Large esophageal hiatal hernia. Enteric tube is coiled in the distal esophagus. Coronary artery calcifications.  Diffuse fatty infiltration of the liver. The gallbladder, spleen, pancreas, adrenal glands, kidneys, inferior vena cava, and retroperitoneal lymph nodes are unremarkable. Calcification of aorta without aneurysm. Most of the stomach is in the hiatal hernia. Gastric malrotation is suggested. There is no evidence of gastric dilatation to suggest obstruction. No gastric wall thickening. Small bowel and colon are not abnormally distended. Gas and stool scattered throughout the colon. No free air or free fluid in the abdomen. Small umbilical hernia containing fat.  Pelvis: Diverticulosis of the sigmoid colon without evidence of diverticulitis. Appendix is surgically absent. Uterus and ovaries are not enlarged. Bladder wall is not thickened. No free or loculated pelvic fluid collections. Degenerative changes in the spine. No destructive bone lesions. Scoliosis of the thorax scoliosis of the thoracic and lumbar spine is probably degenerative.  IMPRESSION: Large esophageal hiatal hernia hour with probable gastric malrotation. No evidence of volvulus or obstruction. Enteric tube is coiled in the distal esophagus. Diffuse degenerative changes in the thoracolumbar spine with scoliosis.   Electronically Signed    By: Lucienne Capers M.D.   On: 06/28/2014 02:07   Dg Abd Portable 1v  06/28/2014   CLINICAL DATA:  NG tube placement  EXAM: PORTABLE ABDOMEN - 1 VIEW  COMPARISON:  04/13/2014  FINDINGS: There is a large esophageal hiatal hernia. The enteric tube is shown coiled in what appears to be the distal esophagus at and  above the level of the hiatal hernia. Shallow inspiration with atelectasis or infiltration in the left lung base. Mild cardiac enlargement.  IMPRESSION: Enteric tube is coiled in the distal esophagus above a large esophageal hiatal hernia.   Electronically Signed   By: Lucienne Capers M.D.   On: 06/28/2014 01:07    EKG: Independently reviewed.   Assessment/Plan Principal Problem:   SOB (shortness of breath) Active Problems:   HTN (hypertension)   Sleep apnea   GERD (gastroesophageal reflux disease)   Hypothyroidism   Diabetes mellitus without complication  SOB: As evidenced on the chest x-ray. Currently the patient is hemodynamically stable. Not obviously septic. General surgeon was consulted. -Admitted to the MedSurg bed and -IV fluid: Normal saline 100 mL per hour -Hold her MiraLAX -Zofran nausea, morphine for pain -check lactic acid, INR, PTT -Follow-up surgeon's recommendation -Check urinalysis and urine culture -trop x 3 given mild chest pain  Hypertension: -Hold indapamide since patient needed IV fluid - when necessary hydralazine IV  Hyperlipidemia:  -continue Zocor  Diabetes mellitus: Patient is on metformin at home. A1c 6.5 on 05/25/12 -Sliding-scale insulin  Hypothyroidism: Patient is on Synthroid at home. No A1c available in record -Check TSH -Continue Synthroid.  Hypokalemia:  -Repleted  DVT ppx: SQ Heparin        Code Status: Full code Family Communication:   Yes, patient's   daughter    at bed side Disposition Plan: Admit to inpatient   Date of Service 06/28/2014    Ivor Costa [facility] Pager [phone]  If 7PM-7AM, please contact  night-coverage [email or web address] Password [facility] 06/28/2014, 7:40 AM

## 2014-06-28 NOTE — Consult Note (Signed)
Referring Provider: Dr. Tana Coast Primary Care Physician:  Jani Gravel, MD Primary Gastroenterologist:  Dr. Watt Climes  Reason for Consultation:  Abdominal pain; Hiatal hernia  HPI: Cristina Middleton is a 81 y.o. female with a history of a large hiatal hernia presents with acute onset of severe diffuse abdominal pain (greatest in epigastric area) yesterday with radiation to her back. She had an episode of vomiting yesterday following eating but family reports that she was able to tolerate liquids yesterday without vomiting. Had 2 episodes of vomiting earlier in the week that she attributed to a virus. EGD in January by Dr. Watt Climes showed a large hiatal hernia, widely patent distal esophageal stenosis, tortuous distal esophagus, mild antritis and Cameron's erosions. History of GERD and on Aciphex for that. Xray shows large hiatal hernia with air-fluid levels. NG tube placed by IR today. Abdominal pain not as bad now. Daughter and son at bedside.  Past Medical History  Diagnosis Date  . PONV (postoperative nausea and vomiting)     YRS AGO.....(6-7 YRS)  . GERD (gastroesophageal reflux disease)     TAKES  ACIPHEX  . Diabetes mellitus without complication   . Arthritis   . Sleep apnea     WEARS MASK EVERY NOW AND THEN  . Hypertension   . Hyperthyroidism     NOT SURE WHICH ONE  . Anxiety   . Dislocated shoulder     WAS POPPED BACK IN PLACE  2006???    Past Surgical History  Procedure Laterality Date  . Breast surgery      CYST REMOVED -- LEFT  . Lipoma excision      FROM LEFT BACK  . Eye surgery      BILATERAL  . Tonsillectomy    . Total knee arthroplasty  05/25/2012    right knee  . Total knee arthroplasty  05/25/2012    Procedure: TOTAL KNEE ARTHROPLASTY;  Surgeon: Yvette Rack., MD;  Location: [facility];  Service: Orthopedics;  Laterality: Right;    Prior to Admission medications   Medication Sig Start Date End Date Taking? Authorizing Provider  acetaminophen (TYLENOL) 325 MG tablet Take 650  mg by mouth at bedtime.   Yes Historical Provider, MD  ALPRAZolam (XANAX) 0.25 MG tablet Take 0.25 mg by mouth every morning.   Yes Historical Provider, MD  aspirin EC 81 MG tablet Take 81 mg by mouth daily.   Yes Historical Provider, MD  Calcium Carb-Cholecalciferol (CALCIUM 600 + D PO) Take 1 tablet by mouth daily.   Yes Historical Provider, MD  CINNAMON PO Take 1,000 mg by mouth daily.   Yes Historical Provider, MD  escitalopram (LEXAPRO) 20 MG tablet Take 20 mg by mouth daily.   Yes Historical Provider, MD  indapamide (LOZOL) 2.5 MG tablet Take 2.5 mg by mouth 3 (three) times a week. 3 days a week (Sunday, Wednesday, Friday)   Yes Historical Provider, MD  L-Methylfolate-B12-B6-B2 (CEREFOLIN PO) Take 1 tablet by mouth daily.   Yes Historical Provider, MD  levothyroxine (SYNTHROID, LEVOTHROID) 88 MCG tablet Take 88 mcg by mouth daily before breakfast.   Yes Historical Provider, MD  loratadine (CLARITIN) 10 MG tablet Take 10 mg by mouth daily.   Yes Historical Provider, MD  metFORMIN (GLUCOPHAGE) 500 MG tablet Take 250 mg by mouth daily with breakfast.   Yes Historical Provider, MD  Multiple Vitamins-Minerals (ICAPS MV) TABS Take 1 tablet by mouth daily.   Yes Historical Provider, MD  polyethylene glycol (MIRALAX / GLYCOLAX) packet Take  17 g by mouth daily as needed for moderate constipation.   Yes Historical Provider, MD  RABEprazole (ACIPHEX) 20 MG tablet Take 40 mg by mouth daily.    Yes Historical Provider, MD  simvastatin (ZOCOR) 20 MG tablet Take 20 mg by mouth at bedtime.    Yes Historical Provider, MD  vitamin B-12 (CYANOCOBALAMIN) 500 MCG tablet Take 500 mcg by mouth daily.   Yes Historical Provider, MD  acetaminophen (TYLENOL) 325 MG tablet Take 2 tablets (650 mg total) by mouth every 6 (six) hours as needed (or Fever >/= 101). Patient not taking: Reported on 06/27/2014 05/28/12   Chriss Czar, PA-C  bisacodyl (DULCOLAX) 10 MG suppository Place 1 suppository (10 mg total) rectally daily  as needed. Patient not taking: Reported on 06/27/2014 05/28/12   Chriss Czar, PA-C  HYDROcodone-acetaminophen (NORCO) 5-325 MG per tablet Take 1 tablet by mouth every 6 (six) hours as needed for severe pain. Patient not taking: Reported on 06/27/2014 04/13/14   Patty Sermons Camprubi-Soms, PA-C    Scheduled Meds: . acetaminophen  650 mg Oral QHS  . fluticasone  2 spray Each Nare Daily  . heparin  5,000 Units Subcutaneous 3 times per day  . insulin aspart  0-9 Units Subcutaneous TID WC  . levothyroxine  44 mcg Intravenous Daily  . pantoprazole (PROTONIX) IV  40 mg Intravenous Q12H  . simvastatin  20 mg Oral QHS   Continuous Infusions: . sodium chloride 100 mL/hr at 06/28/14 1205   PRN Meds:.hydrALAZINE, morphine injection, ondansetron **OR** ondansetron (ZOFRAN) IV  Allergies as of 06/27/2014 - Review Complete 06/27/2014  Allergen Reaction Noted  . Other Other (See Comments) 04/13/2014  . Penicillins Hives, Itching, and Swelling 04/05/2012  . Sulfa antibiotics Hives, Itching, and Swelling 04/05/2012    Family History  Problem Relation Age of Onset  . Other Mother   . Other Father     History   Social History  . Marital Status: Widowed    Spouse Name: N/A  . Number of Children: N/A  . Years of Education: N/A   Occupational History  . Not on file.   Social History Main Topics  . Smoking status: Never Smoker   . Smokeless tobacco: Never Used  . Alcohol Use: No  . Drug Use: No  . Sexual Activity: No   Other Topics Concern  . Not on file   Social History Narrative    Review of Systems: All negative except as stated above in HPI.  Physical Exam: Vital signs: Filed Vitals:   06/28/14 0548  BP: 146/62  Pulse: 74  Temp: 97.7 F (36.5 C)  Resp: 18   Last BM Date: 06/27/14 General:   Lethargic, elderly, frail, well-nourished HEENT: anicteric, NG tube noted Lungs:  Clear throughout to auscultation.   No wheezes, crackles, or rhonchi. No acute  distress. Heart:  Regular rate and rhythm; no murmurs, clicks, rubs,  or gallops. Abdomen: epigastric tenderness with minimal guarding, soft, nondistended, +BS Rectal:  Deferred Ext: no edema  GI:  Lab Results:  Recent Labs  06/27/14 1832 06/28/14 0905  WBC 6.1 4.3  HGB 11.8* 11.3*  HCT 35.6* 34.4*  PLT 188 172   BMET  Recent Labs  06/27/14 1832 06/28/14 0905  NA 137 138  K 3.0* 3.2*  CL 98 98  CO2 27 30  GLUCOSE 141* 103*  BUN 20 14  CREATININE 0.96 0.84  CALCIUM 9.3 8.8   LFT  Recent Labs  06/28/14 0905  PROT 5.8*  ALBUMIN  3.5  AST 24  ALT 20  ALKPHOS 100  BILITOT 0.4   PT/INR No results for input(s): LABPROT, INR in the last 72 hours.   Studies/Results: Dg Chest 2 View  06/27/2014   CLINICAL DATA:  Pt reports having hx of hiatal hernia, having onset of abd pain at 3pm today  EXAM: CHEST  2 VIEW  COMPARISON:  04/13/2014  FINDINGS: Cardiac silhouette is stable. There is a large hiatal hernia. There 2 air-fluid levels associated with the large hiatal hernia. These air-fluid levels are long measuring 10 and 14 cm. Bibasilar atelectasis. No pulmonary edema.  No dilated loops of large or small bowel in the upper abdomen. There is kyphosis the spine with multiple levels of endplate spurring.  IMPRESSION: Large hiatal hernia with 2 Large air-fluid levels. No significant change from prior.   Electronically Signed   By: Suzy Bouchard M.D.   On: 06/27/2014 19:26   Ct Abdomen Pelvis W Contrast  06/28/2014   CLINICAL DATA:  Upper mid abdominal pain and back pain since 1500 hours. Nausea and vomiting. History of diabetes.  EXAM: CT ABDOMEN AND PELVIS WITH CONTRAST  TECHNIQUE: Multidetector CT imaging of the abdomen and pelvis was performed using the standard protocol following bolus administration of intravenous contrast.  CONTRAST:  165mL OMNIPAQUE IOHEXOL 300 MG/ML  SOLN  COMPARISON:  02/15/2012  FINDINGS: Atelectasis in the lung bases. Large esophageal hiatal hernia.  Enteric tube is coiled in the distal esophagus. Coronary artery calcifications.  Diffuse fatty infiltration of the liver. The gallbladder, spleen, pancreas, adrenal glands, kidneys, inferior vena cava, and retroperitoneal lymph nodes are unremarkable. Calcification of aorta without aneurysm. Most of the stomach is in the hiatal hernia. Gastric malrotation is suggested. There is no evidence of gastric dilatation to suggest obstruction. No gastric wall thickening. Small bowel and colon are not abnormally distended. Gas and stool scattered throughout the colon. No free air or free fluid in the abdomen. Small umbilical hernia containing fat.  Pelvis: Diverticulosis of the sigmoid colon without evidence of diverticulitis. Appendix is surgically absent. Uterus and ovaries are not enlarged. Bladder wall is not thickened. No free or loculated pelvic fluid collections. Degenerative changes in the spine. No destructive bone lesions. Scoliosis of the thorax scoliosis of the thoracic and lumbar spine is probably degenerative.  IMPRESSION: Large esophageal hiatal hernia hour with probable gastric malrotation. No evidence of volvulus or obstruction. Enteric tube is coiled in the distal esophagus. Diffuse degenerative changes in the thoracolumbar spine with scoliosis.   Electronically Signed   By: Lucienne Capers M.D.   On: 06/28/2014 02:07   Dg Abd Portable 1v  06/28/2014   CLINICAL DATA:  NG tube placement  EXAM: PORTABLE ABDOMEN - 1 VIEW  COMPARISON:  04/13/2014  FINDINGS: There is a large esophageal hiatal hernia. The enteric tube is shown coiled in what appears to be the distal esophagus at and above the level of the hiatal hernia. Shallow inspiration with atelectasis or infiltration in the left lung base. Mild cardiac enlargement.  IMPRESSION: Enteric tube is coiled in the distal esophagus above a large esophageal hiatal hernia.   Electronically Signed   By: Lucienne Capers M.D.   On: 06/28/2014 01:07   Dg Loyce Dys Tube  Plc W/fl W/rad  06/28/2014   CLINICAL DATA:  NG tube placement.  Large hiatal hernia.  EXAM: NASO G TUBE PLACEMENT WITH FL AND WITH RAD  TECHNIQUE: Fluoroscopic guidance was used by the technologist and the radiologist for NG tube  insertion  CONTRAST:  30 cc Omnipaque 300 injected via the NG tube at the into the procedure  FLUOROSCOPY TIME:  Radiation Exposure Index (as provided by the fluoroscopic device):  If the device does not provide the exposure index:  Fluoroscopy Time (in minutes and seconds): 8 min 0 seconds (7 min by with technologist, 1 min by radiologist).  Number of Acquired Images:  None  COMPARISON:  Under fluoroscopic guidance, the NG tube was manipulated into the stomach within the large hiatal hernia. Despite multiple attempts and efforts, the NG tube could not be advanced through the hiatal hernia into the distal stomach or proximal small bowel. Contrast was injected at the end to verify placement in the stomach.  FINDINGS: NG tube placed into the mid portion of the stomach within the hiatal hernia. Unable to advance further.   Electronically Signed   By: Rolm Baptise M.D.   On: 06/28/2014 11:38    Impression/Plan: Large hiatal hernia with partial obstruction causing vomiting and decreased PO intake. NG tube decompression. Bowel rest. IVFs. Supportive care. Will follow.    LOS: 1 day   [place] C.  06/28/2014, 12:56 PM

## 2014-06-28 NOTE — Progress Notes (Addendum)
Observed that NG tube has no output to wall suction. Checked placement with auscultation; meeting a large amount of resistance with no bubbles heard.  NP on call notified, and it was decided by Rogue Bussing, NP that IR would replace NG tube in am due to hiatal hernia. Orders to remove current NG tube. Will continue to monitor patient.

## 2014-06-28 NOTE — Progress Notes (Addendum)
Attempted report from ED. RN is placing NG tube in patient and will return call once done.  12:38 AM Report received from [facility] for patient to be admitted into [id number]

## 2014-06-28 NOTE — Progress Notes (Signed)
Patient is complaining of headache. Patient has orders for PO tylenol. NP Callhan notified. Orders received for Tylenol per rect.

## 2014-06-28 NOTE — Progress Notes (Signed)
Patient ID: Cristina Middleton  female  XFG:[id number]    DOB: 1921-04-16    DOA: 06/27/2014  PCP: Jani Gravel, MD   Brief history of present illness Cristina Middleton is a 81 y.o. female with past medical history of hypertension, hiatal hernia, GERD, hypothyroidism, diabetes mellitus, OSA, who presents with abdominal pain. The patient reported that she was having abdominal pain a few hours prior to admission, in the epigastric area started after she ate something. She took Xanax per EMS instruction with partial relief. She had nausea and vomited phlegm. No diarrhea. No fever or chills. She also had some mild chest pain. She reported that her pain feels like indigestion. She reported mild burning on urination, but no dysuria. Patient denied cough, diarrhea, skin rashes, joint pain or leg swelling. No unilateral weakness, numbness or tingling sensations. No vision change or hearing loss. In ED, patient was found to have small bowel obstruction on chest x-ray. No leukocytosis, potassium 3.0, negative troponin, temperature normal. She was admitted to inpatient for further evaluation treatment. General surgery was consulted by ED.  Assessment/Plan: Principal Problem:   Abdominal pain with SBO - Currently nothing by mouth, NG tube was coiled hence was discontinued - Continue IV fluid hydration, general surgery and GI consulted, awaiting further recommendations - Repeat NG tube insertion with IR ordered - Place on IV Protonix  Active Problems: Essential Hypertension: -Continue hydralazine PRN IV for now, hold diuretics   Hyperlipidemia:  Hold oral Zocor, NPO  Diabetes mellitus: Patient is on metformin at home. A1c 6.5 on 05/25/12 - Place on sliding scale insulin, check hemoglobin A1c   Hypothyroidism: Patient is on Synthroid at home. No A1c available in record -Check TSH -Continue IV Synthroid.  Hypokalemia:  -Repleted   DVT Prophylaxis:  Code Status: DO NOT RESUSCITATE  Family  Communication: Discussed in detail with patient's granddaughter in law at the bedside  Disposition:  Consultants:  General surgery  GI  Procedures:  none  Antibiotics:  none    Subjective: Patient seen and examined, no active nausea, vomiting or abdominal pain. Issues regarding the NG tube noted  Objective: Weight change:   Intake/Output Summary (Last 24 hours) at 06/28/14 1205 Last data filed at 06/28/14 0824  Gross per 24 hour  Intake      0 ml  Output    800 ml  Net   -800 ml   Blood pressure 146/62, pulse 74, temperature 97.7 F (36.5 C), temperature source Oral, resp. rate 18, height 5\' 4"  (1.626 m), weight 69.3 kg (152 lb 12.5 oz), SpO2 96 %.  Physical Exam: General: Alert and awake, oriented x3, not in any acute distress. CVS: S1-S2 clear, no murmur rubs or gallops Chest: clear to auscultation bilaterally, no wheezing, rales or rhonchi Abdomen: soft, no significant tenderness, nondistended, ++ bowel sounds  Extremities: no cyanosis, clubbing or edema noted bilaterally  Lab Results: Basic Metabolic Panel:  Recent Labs Lab 06/27/14 1832 06/28/14 0905  NA 137 138  K 3.0* 3.2*  CL 98 98  CO2 27 30  GLUCOSE 141* 103*  BUN 20 14  CREATININE 0.96 0.84  CALCIUM 9.3 8.8   Liver Function Tests:  Recent Labs Lab 06/27/14 1832 06/28/14 0905  AST 30 24  ALT 23 20  ALKPHOS 113 100  BILITOT 0.5 0.4  PROT 6.5 5.8*  ALBUMIN 4.1 3.5   No results for input(s): LIPASE, AMYLASE in the last 168 hours. No results for input(s): AMMONIA in the last 168 hours.  CBC:  Recent Labs Lab 06/27/14 1832 06/28/14 0905  WBC 6.1 4.3  HGB 11.8* 11.3*  HCT 35.6* 34.4*  MCV 90.6 91.5  PLT 188 172   Cardiac Enzymes:  Recent Labs Lab 06/28/14 0905  TROPONINI <0.03   BNP: Invalid input(s): POCBNP CBG:  Recent Labs Lab 06/28/14 0243 06/28/14 0827 06/28/14 1153  GLUCAP 97 95 96     Micro Results: No results found for this or any previous visit  (from the past 240 hour(s)).  Studies/Results: Dg Chest 2 View  06/27/2014   CLINICAL DATA:  Pt reports having hx of hiatal hernia, having onset of abd pain at 3pm today  EXAM: CHEST  2 VIEW  COMPARISON:  04/13/2014  FINDINGS: Cardiac silhouette is stable. There is a large hiatal hernia. There 2 air-fluid levels associated with the large hiatal hernia. These air-fluid levels are long measuring 10 and 14 cm. Bibasilar atelectasis. No pulmonary edema.  No dilated loops of large or small bowel in the upper abdomen. There is kyphosis the spine with multiple levels of endplate spurring.  IMPRESSION: Large hiatal hernia with 2 Large air-fluid levels. No significant change from prior.   Electronically Signed   By: Suzy Bouchard M.D.   On: 06/27/2014 19:26   Ct Abdomen Pelvis W Contrast  06/28/2014   CLINICAL DATA:  Upper mid abdominal pain and back pain since 1500 hours. Nausea and vomiting. History of diabetes.  EXAM: CT ABDOMEN AND PELVIS WITH CONTRAST  TECHNIQUE: Multidetector CT imaging of the abdomen and pelvis was performed using the standard protocol following bolus administration of intravenous contrast.  CONTRAST:  159mL OMNIPAQUE IOHEXOL 300 MG/ML  SOLN  COMPARISON:  02/15/2012  FINDINGS: Atelectasis in the lung bases. Large esophageal hiatal hernia. Enteric tube is coiled in the distal esophagus. Coronary artery calcifications.  Diffuse fatty infiltration of the liver. The gallbladder, spleen, pancreas, adrenal glands, kidneys, inferior vena cava, and retroperitoneal lymph nodes are unremarkable. Calcification of aorta without aneurysm. Most of the stomach is in the hiatal hernia. Gastric malrotation is suggested. There is no evidence of gastric dilatation to suggest obstruction. No gastric wall thickening. Small bowel and colon are not abnormally distended. Gas and stool scattered throughout the colon. No free air or free fluid in the abdomen. Small umbilical hernia containing fat.  Pelvis:  Diverticulosis of the sigmoid colon without evidence of diverticulitis. Appendix is surgically absent. Uterus and ovaries are not enlarged. Bladder wall is not thickened. No free or loculated pelvic fluid collections. Degenerative changes in the spine. No destructive bone lesions. Scoliosis of the thorax scoliosis of the thoracic and lumbar spine is probably degenerative.  IMPRESSION: Large esophageal hiatal hernia hour with probable gastric malrotation. No evidence of volvulus or obstruction. Enteric tube is coiled in the distal esophagus. Diffuse degenerative changes in the thoracolumbar spine with scoliosis.   Electronically Signed   By: Lucienne Capers M.D.   On: 06/28/2014 02:07   Dg Abd Portable 1v  06/28/2014   CLINICAL DATA:  NG tube placement  EXAM: PORTABLE ABDOMEN - 1 VIEW  COMPARISON:  04/13/2014  FINDINGS: There is a large esophageal hiatal hernia. The enteric tube is shown coiled in what appears to be the distal esophagus at and above the level of the hiatal hernia. Shallow inspiration with atelectasis or infiltration in the left lung base. Mild cardiac enlargement.  IMPRESSION: Enteric tube is coiled in the distal esophagus above a large esophageal hiatal hernia.   Electronically Signed   By: Gwyndolyn Saxon  Gerilyn Nestle M.D.   On: 06/28/2014 01:07   Dg Loyce Dys Tube Plc W/fl W/rad  06/28/2014   CLINICAL DATA:  NG tube placement.  Large hiatal hernia.  EXAM: NASO G TUBE PLACEMENT WITH FL AND WITH RAD  TECHNIQUE: Fluoroscopic guidance was used by the technologist and the radiologist for NG tube insertion  CONTRAST:  30 cc Omnipaque 300 injected via the NG tube at the into the procedure  FLUOROSCOPY TIME:  Radiation Exposure Index (as provided by the fluoroscopic device):  If the device does not provide the exposure index:  Fluoroscopy Time (in minutes and seconds): 8 min 0 seconds (7 min by with technologist, 1 min by radiologist).  Number of Acquired Images:  None  COMPARISON:  Under fluoroscopic guidance,  the NG tube was manipulated into the stomach within the large hiatal hernia. Despite multiple attempts and efforts, the NG tube could not be advanced through the hiatal hernia into the distal stomach or proximal small bowel. Contrast was injected at the end to verify placement in the stomach.  FINDINGS: NG tube placed into the mid portion of the stomach within the hiatal hernia. Unable to advance further.   Electronically Signed   By: Rolm Baptise M.D.   On: 06/28/2014 11:38    Medications: Scheduled Meds: . acetaminophen  650 mg Oral QHS  . ALPRAZolam  0.25 mg Oral q morning - 10a  . aspirin EC  81 mg Oral Daily  . beta carotene w/minerals  1 tablet Oral Daily  . calcium-vitamin D  1 tablet Oral Daily  . escitalopram  20 mg Oral Daily  . fluticasone  2 spray Each Nare Daily  . heparin  5,000 Units Subcutaneous 3 times per day  . insulin aspart  0-9 Units Subcutaneous TID WC  . levothyroxine  88 mcg Oral QAC breakfast  . loratadine  10 mg Oral Daily  . pantoprazole  40 mg Oral Daily  . simvastatin  20 mg Oral QHS   Time spent 25 minutes   LOS: 1 day   Cecilee Rosner M.D. [facility] 06/28/2014, 12:05 PM Pager: [phone]  If 7PM-7AM, please contact night-coverage [email or web address] Password [email or web address]

## 2014-06-28 NOTE — ED Notes (Signed)
Dr. Claudine Mouton made aware of xray results to verify NG tube placement. Advised Surgery wants NG to be left in at this time.

## 2014-06-28 NOTE — Progress Notes (Signed)
Cristina Middleton Progress Note    Subjective: Patient is back from having NGT place in fluoroscopy.  They could only pass the tube into the proximal stomach.  She currently has not discomfort, but has not been able to eat.  Objective: Vital signs in last 24 hours: Temp:  [97.5 F (36.4 C)-98.2 F (36.8 C)] 97.5 F (36.4 C) (02/27 1339) Pulse Rate:  [70-92] 70 (02/27 1339) Resp:  [17-29] 19 (02/27 1339) BP: (117-173)/(62-96) 146/62 mmHg (02/27 1339) SpO2:  [90 %-97 %] 94 % (02/27 1339) Weight:  [69.3 kg (152 lb 12.5 oz)] 69.3 kg (152 lb 12.5 oz) (02/27 0235) Last BM Date: 06/27/14  Intake/Output from previous day: 02/26 0701 - 02/27 0700 In: -  Out: 600 [Urine:600] Intake/Output this shift: Total I/O In: 0  Out: 200 [Urine:200]  General: No distress  Lungs: Clear  Abd: Soft, completely nontender.  No bowel sounds.  Extremities: No clinical signs or symptoms of DVT  Neuro: Intact  Lab Results:  @LABLAST2 (wbc:2,hgb:2,hct:2,plt:2) BMET  Recent Labs  06/27/14 1832 06/28/14 0905  NA 137 138  K 3.0* 3.2*  CL 98 98  CO2 27 30  GLUCOSE 141* 103*  BUN 20 14  CREATININE 0.96 0.84  CALCIUM 9.3 8.8   PT/INR No results for input(s): LABPROT, INR in the last 72 hours. ABG No results for input(s): PHART, HCO3 in the last 72 hours.  Invalid input(s): PCO2, PO2  Studies/Results: Dg Chest 2 View  06/27/2014   CLINICAL DATA:  Pt reports having hx of hiatal hernia, having onset of abd pain at 3pm today  EXAM: CHEST  2 VIEW  COMPARISON:  04/13/2014  FINDINGS: Cardiac silhouette is stable. There is a large hiatal hernia. There 2 air-fluid levels associated with the large hiatal hernia. These air-fluid levels are long measuring 10 and 14 cm. Bibasilar atelectasis. No pulmonary edema.  No dilated loops of large or small bowel in the upper abdomen. There is kyphosis the spine with multiple levels of endplate spurring.  IMPRESSION: Large hiatal hernia with 2 Large air-fluid levels. No  significant change from prior.   Electronically Signed   By: Suzy Bouchard M.D.   On: 06/27/2014 19:26   Ct Abdomen Pelvis W Contrast  06/28/2014   CLINICAL DATA:  Upper mid abdominal pain and back pain since 1500 hours. Nausea and vomiting. History of diabetes.  EXAM: CT ABDOMEN AND PELVIS WITH CONTRAST  TECHNIQUE: Multidetector CT imaging of the abdomen and pelvis was performed using the standard protocol following bolus administration of intravenous contrast.  CONTRAST:  156mL OMNIPAQUE IOHEXOL 300 MG/ML  SOLN  COMPARISON:  02/15/2012  FINDINGS: Atelectasis in the lung bases. Large esophageal hiatal hernia. Enteric tube is coiled in the distal esophagus. Coronary artery calcifications.  Diffuse fatty infiltration of the liver. The gallbladder, spleen, pancreas, adrenal glands, kidneys, inferior vena cava, and retroperitoneal lymph nodes are unremarkable. Calcification of aorta without aneurysm. Most of the stomach is in the hiatal hernia. Gastric malrotation is suggested. There is no evidence of gastric dilatation to suggest obstruction. No gastric wall thickening. Small bowel and colon are not abnormally distended. Gas and stool scattered throughout the colon. No free air or free fluid in the abdomen. Small umbilical hernia containing fat.  Pelvis: Diverticulosis of the sigmoid colon without evidence of diverticulitis. Appendix is surgically absent. Uterus and ovaries are not enlarged. Bladder wall is not thickened. No free or loculated pelvic fluid collections. Degenerative changes in the spine. No destructive bone lesions. Scoliosis of the thorax  scoliosis of the thoracic and lumbar spine is probably degenerative.  IMPRESSION: Large esophageal hiatal hernia hour with probable gastric malrotation. No evidence of volvulus or obstruction. Enteric tube is coiled in the distal esophagus. Diffuse degenerative changes in the thoracolumbar spine with scoliosis.   Electronically Signed   By: Lucienne Capers M.D.    On: 06/28/2014 02:07   Dg Abd Portable 1v  06/28/2014   CLINICAL DATA:  NG tube placement  EXAM: PORTABLE ABDOMEN - 1 VIEW  COMPARISON:  04/13/2014  FINDINGS: There is a large esophageal hiatal hernia. The enteric tube is shown coiled in what appears to be the distal esophagus at and above the level of the hiatal hernia. Shallow inspiration with atelectasis or infiltration in the left lung base. Mild cardiac enlargement.  IMPRESSION: Enteric tube is coiled in the distal esophagus above a large esophageal hiatal hernia.   Electronically Signed   By: Lucienne Capers M.D.   On: 06/28/2014 01:07   Dg Loyce Dys Tube Plc W/fl W/rad  06/28/2014   CLINICAL DATA:  NG tube placement.  Large hiatal hernia.  EXAM: NASO G TUBE PLACEMENT WITH FL AND WITH RAD  TECHNIQUE: Fluoroscopic guidance was used by the technologist and the radiologist for NG tube insertion  CONTRAST:  30 cc Omnipaque 300 injected via the NG tube at the into the procedure  FLUOROSCOPY TIME:  Radiation Exposure Index (as provided by the fluoroscopic device):  If the device does not provide the exposure index:  Fluoroscopy Time (in minutes and seconds): 8 min 0 seconds (7 min by with technologist, 1 min by radiologist).  Number of Acquired Images:  None  COMPARISON:  Under fluoroscopic guidance, the NG tube was manipulated into the stomach within the large hiatal hernia. Despite multiple attempts and efforts, the NG tube could not be advanced through the hiatal hernia into the distal stomach or proximal small bowel. Contrast was injected at the end to verify placement in the stomach.  FINDINGS: NG tube placed into the mid portion of the stomach within the hiatal hernia. Unable to advance further.   Electronically Signed   By: Rolm Baptise M.D.   On: 06/28/2014 11:38    Anti-infectives: Anti-infectives    None      Assessment/Plan: s/p  Rotated and probably obtructed hiatal hernia, decompressed, but not reduced  Talked with GI MD who would like  to manage expectantly without endoscopy.  Currently she does not require surgery.  LOS: 1 day   Cristina Middleton. Dahlia Bailiff, MD, FACS [phone] [facility] Surgery 06/28/2014

## 2014-06-28 NOTE — ED Notes (Signed)
PT to Ct at this time

## 2014-06-29 DIAGNOSIS — I10 Essential (primary) hypertension: Secondary | ICD-10-CM

## 2014-06-29 LAB — BASIC METABOLIC PANEL
Anion gap: 11 (ref 5–15)
BUN: 10 mg/dL (ref 6–23)
CO2: 27 mmol/L (ref 19–32)
CREATININE: 0.85 mg/dL (ref 0.50–1.10)
Calcium: 8.6 mg/dL (ref 8.4–10.5)
Chloride: 102 mmol/L (ref 96–112)
GFR calc Af Amer: 67 mL/min — ABNORMAL LOW (ref >90)
GFR calc non Af Amer: 58 mL/min — ABNORMAL LOW (ref >90)
Glucose, Bld: 84 mg/dL (ref 70–99)
POTASSIUM: 3.2 mmol/L — AB (ref 3.5–5.1)
Sodium: 140 mmol/L (ref 135–145)

## 2014-06-29 LAB — URINE CULTURE

## 2014-06-29 LAB — GLUCOSE, CAPILLARY
GLUCOSE-CAPILLARY: 117 mg/dL — AB (ref 70–99)
Glucose-Capillary: 86 mg/dL (ref 70–99)
Glucose-Capillary: 75 mg/dL (ref 70–99)
Glucose-Capillary: 62 mg/dL — ABNORMAL LOW (ref 70–99)
Glucose-Capillary: 92 mg/dL (ref 70–99)

## 2014-06-29 MED ORDER — DEXTROSE-NACL 5-0.45 % IV SOLN
INTRAVENOUS | Status: DC
  Administered 2014-06-29 – 2014-06-30 (×2): via INTRAVENOUS
  Administered 2014-06-30: 1000 mL via INTRAVENOUS

## 2014-06-29 MED ORDER — POTASSIUM CHLORIDE CRYS ER 20 MEQ PO TBCR
40.0000 meq | EXTENDED_RELEASE_TABLET | Freq: Once | ORAL | Status: AC
  Administered 2014-06-29: 40 meq via ORAL
  Filled 2014-06-29: qty 2

## 2014-06-29 MED ORDER — DEXTROSE 50 % IV SOLN
INTRAVENOUS | Status: AC
Start: 2014-06-29 — End: 2014-06-29
  Administered 2014-06-29: 50 mL
  Filled 2014-06-29: qty 50

## 2014-06-29 MED ORDER — POTASSIUM CHLORIDE 10 MEQ/100ML IV SOLN
10.0000 meq | INTRAVENOUS | Status: AC
  Administered 2014-06-29 (×3): 10 meq via INTRAVENOUS
  Filled 2014-06-29 (×3): qty 100

## 2014-06-29 NOTE — Progress Notes (Signed)
Patient ID: DESIRE FULP, female   DOB: May 14, 1920, 81 y.o.   MRN: [id number] [facility] Gastroenterology Progress Note  Lamari Youngers JJHERD 81 y.o. [birth year]   Subjective: Sitting in bedside chair. No fluid from NGT. Hurts to swallow. Denies abdominal pain. Family at bedside.  Objective: Vital signs in last 24 hours: Filed Vitals:   06/29/14 0519  BP: 149/72  Pulse: 70  Temp: 97.6 F (36.4 C)  Resp: 17    Physical Exam: Gen: alert, no acute distress  Lab Results:  Recent Labs  06/28/14 0905 06/29/14 0833  NA 138 140  K 3.2* 3.2*  CL 98 102  CO2 30 27  GLUCOSE 103* 84  BUN 14 10  CREATININE 0.84 0.85  CALCIUM 8.8 8.6    Recent Labs  06/27/14 1832 06/28/14 0905  AST 30 24  ALT 23 20  ALKPHOS 113 100  BILITOT 0.5 0.4  PROT 6.5 5.8*  ALBUMIN 4.1 3.5    Recent Labs  06/27/14 1832 06/28/14 0905  WBC 6.1 4.3  HGB 11.8* 11.3*  HCT 35.6* 34.4*  MCV 90.6 91.5  PLT 188 172   No results for input(s): LABPROT, INR in the last 72 hours.    Assessment/Plan: Large hiatal hernia with a resultant partial gastric obstruction - repeat abd xray tomorrow and if air-fluid levels still noted then may need EGD to see if able to traverse hiatal hernia. If not able to traverse hiatal hernia, then will likely need surgery. Surgery clamped NGT but would keep in place for now.   [place] C. 06/29/2014, 1:39 PM

## 2014-06-29 NOTE — Progress Notes (Signed)
Patient ID: Cristina Middleton  female  HDQ:[id number]    DOB: Jan 20, 1921    DOA: 06/27/2014  PCP: Cristina Gravel, MD   Brief history of present illness Cristina Middleton is a 81 y.o. female with past medical history of hypertension, hiatal hernia, GERD, hypothyroidism, diabetes mellitus, OSA, who presents with abdominal pain. The patient reported that she was having abdominal pain a few hours prior to admission, in the epigastric area started after she ate something. She took Xanax per EMS instruction with partial relief. She had nausea and vomited phlegm. No diarrhea. No fever or chills. She also had some mild chest pain. She reported that her pain feels like indigestion. She reported mild burning on urination, but no dysuria. Patient denied cough, diarrhea, skin rashes, joint pain or leg swelling. No unilateral weakness, numbness or tingling sensations. No vision change or hearing loss. In ED, patient was found to have small bowel obstruction on chest x-ray. No leukocytosis, potassium 3.0, negative troponin, temperature normal. She was admitted to inpatient for further evaluation treatment. General surgery was consulted by ED.  Assessment/Plan: Principal Problem:   Abdominal pain with SBO - Feeling miserable today with NG tube, minimal epigastric pain, no nausea or vomiting - Continue IV fluids, surgery and GI following, NG tube placed with IR assistance - Continue IV Protonix,   Active Problems: Essential Hypertension: -Continue hydralazine PRN IV for now, hold diuretics   Hyperlipidemia:  Hold oral Zocor, NPO  Diabetes mellitus: Patient is on metformin at home. A1c 6.5 on 05/25/12 - Place on sliding scale insulin, check hemoglobin A1c   Hypothyroidism: Patient is on Synthroid at home. No A1c available in record -Check TSH -Continue IV Synthroid.  Hypokalemia:  -Repleted  DVT Prophylaxis:  Code Status: DO NOT RESUSCITATE  Family Communication: called pt's son, Cristina Middleton, left detailed  voicemail message   Disposition:  Consultants:  General surgery  GI  Procedures:  none  Antibiotics:  none    Subjective: Patient seen and examined, no active nausea or vomiting, miserable with NG tube   Objective: Weight change:   Intake/Output Summary (Last 24 hours) at 06/29/14 1236 Last data filed at 06/29/14 0359  Gross per 24 hour  Intake      0 ml  Output    325 ml  Net   -325 ml   Blood pressure 149/72, pulse 70, temperature 97.6 F (36.4 C), temperature source Oral, resp. rate 17, height 5\' 4"  (1.626 m), weight 69.3 kg (152 lb 12.5 oz), SpO2 95 %.  Physical Exam: General:  alert and oriented 3, NAD  CVS: S1-S2 clear, no murmur rubs or gallops Chest: CTAB Abdomen: soft,  minimal epigastric tenderness, nondistended, + bowel sounds  Extremities: no cyanosis, clubbing or edema noted bilaterally  Lab Results: Basic Metabolic Panel:  Recent Labs Lab 06/28/14 0905 06/29/14 0833  NA 138 140  K 3.2* 3.2*  CL 98 102  CO2 30 27  GLUCOSE 103* 84  BUN 14 10  CREATININE 0.84 0.85  CALCIUM 8.8 8.6   Liver Function Tests:  Recent Labs Lab 06/27/14 1832 06/28/14 0905  AST 30 24  ALT 23 20  ALKPHOS 113 100  BILITOT 0.5 0.4  PROT 6.5 5.8*  ALBUMIN 4.1 3.5   No results for input(s): LIPASE, AMYLASE in the last 168 hours. No results for input(s): AMMONIA in the last 168 hours. CBC:  Recent Labs Lab 06/27/14 1832 06/28/14 0905  WBC 6.1 4.3  HGB 11.8* 11.3*  HCT 35.6* 34.4*  MCV 90.6 91.5  PLT 188 172   Cardiac Enzymes:  Recent Labs Lab 06/28/14 0905 06/28/14 1500  TROPONINI <0.03 <0.03   BNP: Invalid input(s): POCBNP CBG:  Recent Labs Lab 06/28/14 0827 06/28/14 1153 06/28/14 1657 06/28/14 2117 06/29/14 0830  GLUCAP 95 96 76 86 86     Micro Results: Recent Results (from the past 240 hour(s))  Urine culture     Status: None   Collection Time: 06/28/14  4:56 AM  Result Value Ref Range Status   Specimen Description  URINE, CLEAN CATCH  Final   Special Requests NONE  Final   Colony Count   Final    >=100,000 COLONIES/ML Performed at [place] Performed at [facility]   Final   Report Status 06/29/2014 FINAL  Final    Studies/Results: Dg Chest 2 View  06/27/2014   CLINICAL DATA:  Pt reports having hx of hiatal hernia, having onset of abd pain at 3pm today  EXAM: CHEST  2 VIEW  COMPARISON:  04/13/2014  FINDINGS: Cardiac silhouette is stable. There is a large hiatal hernia. There 2 air-fluid levels associated with the large hiatal hernia. These air-fluid levels are long measuring 10 and 14 cm. Bibasilar atelectasis. No pulmonary edema.  No dilated loops of large or small bowel in the upper abdomen. There is kyphosis the spine with multiple levels of endplate spurring.  IMPRESSION: Large hiatal hernia with 2 Large air-fluid levels. No significant change from prior.   Electronically Signed   By: Suzy Bouchard M.D.   On: 06/27/2014 19:26   Ct Abdomen Pelvis W Contrast  06/28/2014   CLINICAL DATA:  Upper mid abdominal pain and back pain since 1500 hours. Nausea and vomiting. History of diabetes.  EXAM: CT ABDOMEN AND PELVIS WITH CONTRAST  TECHNIQUE: Multidetector CT imaging of the abdomen and pelvis was performed using the standard protocol following bolus administration of intravenous contrast.  CONTRAST:  140mL OMNIPAQUE IOHEXOL 300 MG/ML  SOLN  COMPARISON:  02/15/2012  FINDINGS: Atelectasis in the lung bases. Large esophageal hiatal hernia. Enteric tube is coiled in the distal esophagus. Coronary artery calcifications.  Diffuse fatty infiltration of the liver. The gallbladder, spleen, pancreas, adrenal glands, kidneys, inferior vena cava, and retroperitoneal lymph nodes are unremarkable. Calcification of aorta without aneurysm. Most of the stomach is in the hiatal hernia. Gastric malrotation is suggested. There is no evidence of gastric dilatation to suggest obstruction. No  gastric wall thickening. Small bowel and colon are not abnormally distended. Gas and stool scattered throughout the colon. No free air or free fluid in the abdomen. Small umbilical hernia containing fat.  Pelvis: Diverticulosis of the sigmoid colon without evidence of diverticulitis. Appendix is surgically absent. Uterus and ovaries are not enlarged. Bladder wall is not thickened. No free or loculated pelvic fluid collections. Degenerative changes in the spine. No destructive bone lesions. Scoliosis of the thorax scoliosis of the thoracic and lumbar spine is probably degenerative.  IMPRESSION: Large esophageal hiatal hernia hour with probable gastric malrotation. No evidence of volvulus or obstruction. Enteric tube is coiled in the distal esophagus. Diffuse degenerative changes in the thoracolumbar spine with scoliosis.   Electronically Signed   By: Lucienne Capers M.D.   On: 06/28/2014 02:07   Dg Abd Portable 1v  06/28/2014   CLINICAL DATA:  NG tube placement  EXAM: PORTABLE ABDOMEN - 1 VIEW  COMPARISON:  04/13/2014  FINDINGS: There is a large esophageal hiatal hernia. The enteric tube  is shown coiled in what appears to be the distal esophagus at and above the level of the hiatal hernia. Shallow inspiration with atelectasis or infiltration in the left lung base. Mild cardiac enlargement.  IMPRESSION: Enteric tube is coiled in the distal esophagus above a large esophageal hiatal hernia.   Electronically Signed   By: Lucienne Capers M.D.   On: 06/28/2014 01:07   Dg Loyce Dys Tube Plc W/fl W/rad  06/28/2014   CLINICAL DATA:  NG tube placement.  Large hiatal hernia.  EXAM: NASO G TUBE PLACEMENT WITH FL AND WITH RAD  TECHNIQUE: Fluoroscopic guidance was used by the technologist and the radiologist for NG tube insertion  CONTRAST:  30 cc Omnipaque 300 injected via the NG tube at the into the procedure  FLUOROSCOPY TIME:  Radiation Exposure Index (as provided by the fluoroscopic device):  If the device does not  provide the exposure index:  Fluoroscopy Time (in minutes and seconds): 8 min 0 seconds (7 min by with technologist, 1 min by radiologist).  Number of Acquired Images:  None  COMPARISON:  Under fluoroscopic guidance, the NG tube was manipulated into the stomach within the large hiatal hernia. Despite multiple attempts and efforts, the NG tube could not be advanced through the hiatal hernia into the distal stomach or proximal small bowel. Contrast was injected at the end to verify placement in the stomach.  FINDINGS: NG tube placed into the mid portion of the stomach within the hiatal hernia. Unable to advance further.   Electronically Signed   By: Rolm Baptise M.D.   On: 06/28/2014 11:38    Medications: Scheduled Meds: . acetaminophen  650 mg Oral QHS  . fluticasone  2 spray Each Nare Daily  . heparin  5,000 Units Subcutaneous 3 times per day  . insulin aspart  0-9 Units Subcutaneous TID WC  . levothyroxine  44 mcg Intravenous Daily  . pantoprazole (PROTONIX) IV  40 mg Intravenous Q12H  . simvastatin  20 mg Oral QHS   Time spent 25 minutes   LOS: 2 days   Taesha Goodell M.D. [facility] 06/29/2014, 12:36 PM Pager: [phone]  If 7PM-7AM, please contact night-coverage [email or web address] Password [email or web address]

## 2014-06-29 NOTE — Progress Notes (Signed)
Subjective: PT with min epigastric sorenes. No n/v.  BM x1  Objective: Vital signs in last 24 hours: Temp:  [97.5 F (36.4 C)-97.9 F (36.6 C)] 97.6 F (36.4 C) (02/28 0519) Pulse Rate:  [70-81] 70 (02/28 0519) Resp:  [17-19] 17 (02/28 0519) BP: (144-149)/(62-72) 149/72 mmHg (02/28 0519) SpO2:  [93 %-95 %] 95 % (02/28 0519) Last BM Date: 06/27/14  Intake/Output from previous day: 02/27 0701 - 02/28 0700 In: 0  Out: 525 [Urine:525] Intake/Output this shift:    General appearance: alert and cooperative GI: soft, min ttp epigastrum, nd, no rebound/guarding  Lab Results:   Recent Labs  06/27/14 1832 06/28/14 0905  WBC 6.1 4.3  HGB 11.8* 11.3*  HCT 35.6* 34.4*  PLT 188 172   BMET  Recent Labs  06/27/14 1832 06/28/14 0905  NA 137 138  K 3.0* 3.2*  CL 98 98  CO2 27 30  GLUCOSE 141* 103*  BUN 20 14  CREATININE 0.96 0.84  CALCIUM 9.3 8.8   PT/INR No results for input(s): LABPROT, INR in the last 72 hours. ABG No results for input(s): PHART, HCO3 in the last 72 hours.  Invalid input(s): PCO2, PO2  Studies/Results: Dg Chest 2 View  06/27/2014   CLINICAL DATA:  Pt reports having hx of hiatal hernia, having onset of abd pain at 3pm today  EXAM: CHEST  2 VIEW  COMPARISON:  04/13/2014  FINDINGS: Cardiac silhouette is stable. There is a large hiatal hernia. There 2 air-fluid levels associated with the large hiatal hernia. These air-fluid levels are long measuring 10 and 14 cm. Bibasilar atelectasis. No pulmonary edema.  No dilated loops of large or small bowel in the upper abdomen. There is kyphosis the spine with multiple levels of endplate spurring.  IMPRESSION: Large hiatal hernia with 2 Large air-fluid levels. No significant change from prior.   Electronically Signed   By: Suzy Bouchard M.D.   On: 06/27/2014 19:26   Ct Abdomen Pelvis W Contrast  06/28/2014   CLINICAL DATA:  Upper mid abdominal pain and back pain since 1500 hours. Nausea and vomiting. History  of diabetes.  EXAM: CT ABDOMEN AND PELVIS WITH CONTRAST  TECHNIQUE: Multidetector CT imaging of the abdomen and pelvis was performed using the standard protocol following bolus administration of intravenous contrast.  CONTRAST:  138mL OMNIPAQUE IOHEXOL 300 MG/ML  SOLN  COMPARISON:  02/15/2012  FINDINGS: Atelectasis in the lung bases. Large esophageal hiatal hernia. Enteric tube is coiled in the distal esophagus. Coronary artery calcifications.  Diffuse fatty infiltration of the liver. The gallbladder, spleen, pancreas, adrenal glands, kidneys, inferior vena cava, and retroperitoneal lymph nodes are unremarkable. Calcification of aorta without aneurysm. Most of the stomach is in the hiatal hernia. Gastric malrotation is suggested. There is no evidence of gastric dilatation to suggest obstruction. No gastric wall thickening. Small bowel and colon are not abnormally distended. Gas and stool scattered throughout the colon. No free air or free fluid in the abdomen. Small umbilical hernia containing fat.  Pelvis: Diverticulosis of the sigmoid colon without evidence of diverticulitis. Appendix is surgically absent. Uterus and ovaries are not enlarged. Bladder wall is not thickened. No free or loculated pelvic fluid collections. Degenerative changes in the spine. No destructive bone lesions. Scoliosis of the thorax scoliosis of the thoracic and lumbar spine is probably degenerative.  IMPRESSION: Large esophageal hiatal hernia hour with probable gastric malrotation. No evidence of volvulus or obstruction. Enteric tube is coiled in the distal esophagus. Diffuse degenerative changes in the  thoracolumbar spine with scoliosis.   Electronically Signed   By: Lucienne Capers M.D.   On: 06/28/2014 02:07   Dg Abd Portable 1v  06/28/2014   CLINICAL DATA:  NG tube placement  EXAM: PORTABLE ABDOMEN - 1 VIEW  COMPARISON:  04/13/2014  FINDINGS: There is a large esophageal hiatal hernia. The enteric tube is shown coiled in what appears  to be the distal esophagus at and above the level of the hiatal hernia. Shallow inspiration with atelectasis or infiltration in the left lung base. Mild cardiac enlargement.  IMPRESSION: Enteric tube is coiled in the distal esophagus above a large esophageal hiatal hernia.   Electronically Signed   By: Lucienne Capers M.D.   On: 06/28/2014 01:07   Dg Loyce Dys Tube Plc W/fl W/rad  06/28/2014   CLINICAL DATA:  NG tube placement.  Large hiatal hernia.  EXAM: NASO G TUBE PLACEMENT WITH FL AND WITH RAD  TECHNIQUE: Fluoroscopic guidance was used by the technologist and the radiologist for NG tube insertion  CONTRAST:  30 cc Omnipaque 300 injected via the NG tube at the into the procedure  FLUOROSCOPY TIME:  Radiation Exposure Index (as provided by the fluoroscopic device):  If the device does not provide the exposure index:  Fluoroscopy Time (in minutes and seconds): 8 min 0 seconds (7 min by with technologist, 1 min by radiologist).  Number of Acquired Images:  None  COMPARISON:  Under fluoroscopic guidance, the NG tube was manipulated into the stomach within the large hiatal hernia. Despite multiple attempts and efforts, the NG tube could not be advanced through the hiatal hernia into the distal stomach or proximal small bowel. Contrast was injected at the end to verify placement in the stomach.  FINDINGS: NG tube placed into the mid portion of the stomach within the hiatal hernia. Unable to advance further.   Electronically Signed   By: Rolm Baptise M.D.   On: 06/28/2014 11:38    Anti-infectives: Anti-infectives    None      Assessment/Plan: Large hiatal hernia SBO vs. Gastroenteritis  Can clamp NGT Mobilize No surgery plans  LOS: 2 days    Rosario Jacks., [facility] 06/29/2014

## 2014-06-30 ENCOUNTER — Inpatient Hospital Stay (HOSPITAL_COMMUNITY): Payer: Medicare Other

## 2014-06-30 LAB — GLUCOSE, CAPILLARY
GLUCOSE-CAPILLARY: 123 mg/dL — AB (ref 70–99)
GLUCOSE-CAPILLARY: 142 mg/dL — AB (ref 70–99)
Glucose-Capillary: 94 mg/dL (ref 70–99)
Glucose-Capillary: 133 mg/dL — ABNORMAL HIGH (ref 70–99)
Glucose-Capillary: 110 mg/dL — ABNORMAL HIGH (ref 70–99)

## 2014-06-30 LAB — BASIC METABOLIC PANEL
Anion gap: 9 (ref 5–15)
BUN: 7 mg/dL (ref 6–23)
CALCIUM: 8.6 mg/dL (ref 8.4–10.5)
CO2: 27 mmol/L (ref 19–32)
Chloride: 102 mmol/L (ref 96–112)
Creatinine, Ser: 0.78 mg/dL (ref 0.50–1.10)
GFR calc Af Amer: 82 mL/min — ABNORMAL LOW (ref >90)
GFR calc non Af Amer: 71 mL/min — ABNORMAL LOW (ref >90)
GLUCOSE: 99 mg/dL (ref 70–99)
POTASSIUM: 3.4 mmol/L — AB (ref 3.5–5.1)
Sodium: 138 mmol/L (ref 135–145)

## 2014-06-30 LAB — TSH: TSH: 3.933 u[IU]/mL (ref 0.350–4.500)

## 2014-06-30 MED ORDER — POTASSIUM CHLORIDE 10 MEQ/100ML IV SOLN
10.0000 meq | INTRAVENOUS | Status: AC
  Administered 2014-06-30 (×3): 10 meq via INTRAVENOUS
  Filled 2014-06-30 (×3): qty 100

## 2014-06-30 NOTE — Progress Notes (Signed)
Patient ID: CIGI BEGA  female  YBO:[id number]    DOB: 1920/11/14    DOA: 06/27/2014  PCP: Jani Gravel, MD   Brief history of present illness Cristina Middleton is a 81 y.o. female with past medical history of hypertension, hiatal hernia, GERD, hypothyroidism, diabetes mellitus, OSA, who presents with abdominal pain. The patient reported that she was having abdominal pain a few hours prior to admission, in the epigastric area started after she ate something. She took Xanax per EMS instruction with partial relief. She had nausea and vomited phlegm. No diarrhea. No fever or chills. She also had some mild chest pain. She reported that her pain feels like indigestion. She reported mild burning on urination, but no dysuria. Patient denied cough, diarrhea, skin rashes, joint pain or leg swelling. No unilateral weakness, numbness or tingling sensations. No vision change or hearing loss. In ED, patient was found to have small bowel obstruction on chest x-ray. No leukocytosis, potassium 3.0, negative troponin, temperature normal. She was admitted to inpatient for further evaluation treatment. General surgery was consulted by ED.  Assessment/Plan: Principal Problem:   Abdominal pain with SBO - Feeling a whole lot better today, no nausea or vomiting at the time of my examination, denies any abdominal pain, states had bowel movement 2 yesterday.  - NG tube clamped, started on clear liquid diet - Continue IV fluids, surgery and GI following - Continue IV Protonix  Active Problems: Essential Hypertension: -Continue hydralazine PRN IV for now, hold diuretics   Hyperlipidemia:  Hold oral Zocor, NPO  Diabetes mellitus: Patient is on metformin at home. A1c 6.5 on 05/25/12 Place on sliding scale insulin, check hemoglobin A1c   Hypothyroidism: Patient is on Synthroid at home. No A1c available in record TSH 3.93, continue IV Synthroid.  Hypokalemia:  -Repleted  DVT Prophylaxis:  Code Status: DO NOT  RESUSCITATE  Family Communication: Discussed with patient's daughter at the bedside  Disposition:  Consultants:  General surgery  GI  Procedures:  none  Antibiotics:  none    Subjective: NGT clamped, patient sitting up in the chair, feels a whole lot better. No nausea, vomiting, abdominal pain  Objective: Weight change:   Intake/Output Summary (Last 24 hours) at 06/30/14 1313 Last data filed at 06/30/14 1220  Gross per 24 hour  Intake 1526.67 ml  Output   1250 ml  Net 276.67 ml   Blood pressure 151/59, pulse 81, temperature 98 F (36.7 C), temperature source Oral, resp. rate 19, height 5\' 4"  (1.626 m), weight 69.3 kg (152 lb 12.5 oz), SpO2 91 %.  Physical Exam: General:  alert and oriented 3, NAD  CVS: S1-S2 clear Chest: CTAB Abdomen: soft, no significant tenderness noted, nondistended, + bowel sounds  Extremities: no cyanosis, clubbing or edema noted bilaterally  Lab Results: Basic Metabolic Panel:  Recent Labs Lab 06/29/14 0833 06/30/14 0527  NA 140 138  K 3.2* 3.4*  CL 102 102  CO2 27 27  GLUCOSE 84 99  BUN 10 7  CREATININE 0.85 0.78  CALCIUM 8.6 8.6   Liver Function Tests:  Recent Labs Lab 06/27/14 1832 06/28/14 0905  AST 30 24  ALT 23 20  ALKPHOS 113 100  BILITOT 0.5 0.4  PROT 6.5 5.8*  ALBUMIN 4.1 3.5   No results for input(s): LIPASE, AMYLASE in the last 168 hours. No results for input(s): AMMONIA in the last 168 hours. CBC:  Recent Labs Lab 06/27/14 1832 06/28/14 0905  WBC 6.1 4.3  HGB 11.8* 11.3*  HCT 35.6* 34.4*  MCV 90.6 91.5  PLT 188 172   Cardiac Enzymes:  Recent Labs Lab 06/28/14 0905 06/28/14 1500  TROPONINI <0.03 <0.03   BNP: Invalid input(s): POCBNP CBG:  Recent Labs Lab 06/29/14 2016 06/29/14 2155 06/30/14 0300 06/30/14 0843 06/30/14 1138  GLUCAP 117* 92 94 123* 142*     Micro Results: Recent Results (from the past 240 hour(s))  Urine culture     Status: None   Collection Time:  06/28/14  4:56 AM  Result Value Ref Range Status   Specimen Description URINE, CLEAN CATCH  Final   Special Requests NONE  Final   Colony Count   Final    >=100,000 COLONIES/ML Performed at [place] Performed at [facility]   Final   Report Status 06/29/2014 FINAL  Final    Studies/Results: Dg Chest 2 View  06/30/2014   CLINICAL DATA:  Hiatal hernia and abdominal pain. Shortness of breath.  EXAM: CHEST  2 VIEW  COMPARISON:  06/27/2014  FINDINGS: Nasogastric tube has been placed. The nasogastric tube is within the hiatal hernia. There is decreased gaseous distension of the hiatal hernia. Streaky densities at the left lung base are suggestive for atelectasis. Otherwise, the lungs are clear. Heart size is normal. Question small pleural effusions.  IMPRESSION: Decompression of the hiatal hernia with a nasogastric tube. The nasogastric tube is coiled within the hernia.  Basilar chest densities may represent a combination of atelectasis and small effusions.   Electronically Signed   By: Markus Daft M.D.   On: 06/30/2014 07:56   Dg Chest 2 View  06/27/2014   CLINICAL DATA:  Pt reports having hx of hiatal hernia, having onset of abd pain at 3pm today  EXAM: CHEST  2 VIEW  COMPARISON:  04/13/2014  FINDINGS: Cardiac silhouette is stable. There is a large hiatal hernia. There 2 air-fluid levels associated with the large hiatal hernia. These air-fluid levels are long measuring 10 and 14 cm. Bibasilar atelectasis. No pulmonary edema.  No dilated loops of large or small bowel in the upper abdomen. There is kyphosis the spine with multiple levels of endplate spurring.  IMPRESSION: Large hiatal hernia with 2 Large air-fluid levels. No significant change from prior.   Electronically Signed   By: Suzy Bouchard M.D.   On: 06/27/2014 19:26   Ct Abdomen Pelvis W Contrast  06/28/2014   CLINICAL DATA:  Upper mid abdominal pain and back pain since 1500 hours. Nausea and vomiting.  History of diabetes.  EXAM: CT ABDOMEN AND PELVIS WITH CONTRAST  TECHNIQUE: Multidetector CT imaging of the abdomen and pelvis was performed using the standard protocol following bolus administration of intravenous contrast.  CONTRAST:  1107mL OMNIPAQUE IOHEXOL 300 MG/ML  SOLN  COMPARISON:  02/15/2012  FINDINGS: Atelectasis in the lung bases. Large esophageal hiatal hernia. Enteric tube is coiled in the distal esophagus. Coronary artery calcifications.  Diffuse fatty infiltration of the liver. The gallbladder, spleen, pancreas, adrenal glands, kidneys, inferior vena cava, and retroperitoneal lymph nodes are unremarkable. Calcification of aorta without aneurysm. Most of the stomach is in the hiatal hernia. Gastric malrotation is suggested. There is no evidence of gastric dilatation to suggest obstruction. No gastric wall thickening. Small bowel and colon are not abnormally distended. Gas and stool scattered throughout the colon. No free air or free fluid in the abdomen. Small umbilical hernia containing fat.  Pelvis: Diverticulosis of the sigmoid colon without evidence of diverticulitis. Appendix is surgically  absent. Uterus and ovaries are not enlarged. Bladder wall is not thickened. No free or loculated pelvic fluid collections. Degenerative changes in the spine. No destructive bone lesions. Scoliosis of the thorax scoliosis of the thoracic and lumbar spine is probably degenerative.  IMPRESSION: Large esophageal hiatal hernia hour with probable gastric malrotation. No evidence of volvulus or obstruction. Enteric tube is coiled in the distal esophagus. Diffuse degenerative changes in the thoracolumbar spine with scoliosis.   Electronically Signed   By: Lucienne Capers M.D.   On: 06/28/2014 02:07   Dg Abd Portable 1v  06/28/2014   CLINICAL DATA:  NG tube placement  EXAM: PORTABLE ABDOMEN - 1 VIEW  COMPARISON:  04/13/2014  FINDINGS: There is a large esophageal hiatal hernia. The enteric tube is shown coiled in what  appears to be the distal esophagus at and above the level of the hiatal hernia. Shallow inspiration with atelectasis or infiltration in the left lung base. Mild cardiac enlargement.  IMPRESSION: Enteric tube is coiled in the distal esophagus above a large esophageal hiatal hernia.   Electronically Signed   By: Lucienne Capers M.D.   On: 06/28/2014 01:07   Dg Loyce Dys Tube Plc W/fl W/rad  06/28/2014   CLINICAL DATA:  NG tube placement.  Large hiatal hernia.  EXAM: NASO G TUBE PLACEMENT WITH FL AND WITH RAD  TECHNIQUE: Fluoroscopic guidance was used by the technologist and the radiologist for NG tube insertion  CONTRAST:  30 cc Omnipaque 300 injected via the NG tube at the into the procedure  FLUOROSCOPY TIME:  Radiation Exposure Index (as provided by the fluoroscopic device):  If the device does not provide the exposure index:  Fluoroscopy Time (in minutes and seconds): 8 min 0 seconds (7 min by with technologist, 1 min by radiologist).  Number of Acquired Images:  None  COMPARISON:  Under fluoroscopic guidance, the NG tube was manipulated into the stomach within the large hiatal hernia. Despite multiple attempts and efforts, the NG tube could not be advanced through the hiatal hernia into the distal stomach or proximal small bowel. Contrast was injected at the end to verify placement in the stomach.  FINDINGS: NG tube placed into the mid portion of the stomach within the hiatal hernia. Unable to advance further.   Electronically Signed   By: Rolm Baptise M.D.   On: 06/28/2014 11:38    Medications: Scheduled Meds: . acetaminophen  650 mg Oral QHS  . fluticasone  2 spray Each Nare Daily  . heparin  5,000 Units Subcutaneous 3 times per day  . insulin aspart  0-9 Units Subcutaneous TID WC  . levothyroxine  44 mcg Intravenous Daily  . pantoprazole (PROTONIX) IV  40 mg Intravenous Q12H  . simvastatin  20 mg Oral QHS   Time spent 25 minutes   LOS: 3 days   RAI,RIPUDEEP M.D. [facility] 06/30/2014, 1:13 PM Pager: [phone]  If 7PM-7AM, please contact night-coverage [email or web address] Password [email or web address]

## 2014-06-30 NOTE — Progress Notes (Signed)
Physical Therapy Treatment Patient Details Name: Cristina Middleton MRN: [id number] DOB: [birth year] Today's Date: 06/30/2014    History of Present Illness Cristina Middleton is a 81 y.o. female with past medical history of hypertension, hiatal hernia, GERD, hypothyroidism, diabetes mellitus, OSA, who presents with abdominal pain.    PT Comments    Progressing well.  Gait stability and tolerance for activity appear to be improving  Follow Up Recommendations  No PT follow up     Equipment Recommendations  None recommended by PT    Recommendations for Other Services       Precautions / Restrictions Precautions Precautions: None Restrictions Weight Bearing Restrictions: No    Mobility  Bed Mobility                  Transfers Overall transfer level: Needs assistance Equipment used: None;Straight cane Transfers: Sit to/from Stand Sit to Stand: Supervision         General transfer comment: safe technique  Ambulation/Gait Ambulation/Gait assistance: Min guard Ambulation Distance (Feet): 140 Feet Assistive device: Straight cane Gait Pattern/deviations: Step-through pattern Gait velocity: decreased   General Gait Details: mildly antalgic gait with episodes on scissoring R leg to maintain balance with cane.   Stairs            Wheelchair Mobility    Modified Rankin (Stroke Patients Only)       Balance Overall balance assessment: Needs assistance Sitting-balance support: No upper extremity supported Sitting balance-Leahy Scale: Good     Standing balance support: No upper extremity supported Standing balance-Leahy Scale: Fair                      Cognition Arousal/Alertness: Awake/alert Behavior During Therapy: WFL for tasks assessed/performed Overall Cognitive Status: History of cognitive impairments - at baseline                      Exercises General Exercises - Lower Extremity Ankle Circles/Pumps: AROM;20 reps;Seated Long  Arc Quad: AROM;Both;10 reps;Seated Hip Flexion/Marching: AROM;Strengthening;Both;10 reps;Seated (graded resistance) Toe Raises: AROM;Both;20 reps;Seated Heel Raises: AROM;Both;20 reps;Seated    General Comments        Pertinent Vitals/Pain Pain Assessment: Faces Faces Pain Scale: Hurts little more Pain Location: knee pain with LE exercise on L Pain Descriptors / Indicators: Aching Pain Intervention(s): Monitored during session    Home Living                      Prior Function            PT Goals (current goals can now be found in the care plan section) Acute Rehab PT Goals Patient Stated Goal: return home PT Goal Formulation: With patient Time For Goal Achievement: 07/12/14 Potential to Achieve Goals: Good Progress towards PT goals: Progressing toward goals    Frequency  Min 3X/week    PT Plan      Co-evaluation             End of Session   Activity Tolerance: Patient tolerated treatment well Patient left: in bed;with call bell/phone within reach;with family/visitor present     Time: 8527-7824 PT Time Calculation (min) (ACUTE ONLY): 20 min  Charges:  $Gait Training: 8-22 mins                    G Codes:      Marium Ragan, Tessie Fass 06/30/2014, 10:38 AM 06/30/2014  Donnella Sham, [place] [phone]  (  pager)

## 2014-06-30 NOTE — Progress Notes (Signed)
Cristina Middleton 9:26 AM  Subjective: Patient well-known to me from years of problems with her hiatal hernia although not this bad however she is doing better and her case was discussed with my partner and the hospital computer chart was reviewed as was my previous endoscopy last year and she is feeling better and just has minimal amount of nausea but no pain and a little diarrhea but no other complaints  Objective: Vital signs stable afebrile patient looks well will alert and oriented no acute distress lungs are clear heart regular rate and rhythm abdomen is soft nontender x-ray improved  Assessment: Large hiatal hernia with partial volvulus currently improved  Plan: Agree with clear liquids and clamp NG and if she does well today may remove NG this evening and hopefully slowly advance diet tomorrow and the case was discussed with the patient and her daughter and if this becomes a chronic recurrent problem occurring fairly frequently we will probably need to proceed with surgical options and will ask my partner Dr. Michail Sermon to see tomorrow  [facility]

## 2014-06-30 NOTE — Evaluation (Signed)
Occupational Therapy Evaluation Patient Details Name: Cristina Middleton MRN: [id number] DOB: 31-Aug-1920 Today's Date: 06/30/2014    History of Present Illness Cristina Middleton is a 81 y.o. female with past medical history of hypertension, hiatal hernia, GERD, hypothyroidism, diabetes mellitus, OSA, who presents with abdominal pain.   Clinical Impression   Pt admitted with above. Pt independent with ADLs, PTA. Feel pt will benefit from acute OT to increase independence, strength, and activity tolerance prior to d/c.     Follow Up Recommendations  No OT follow up;Supervision - Intermittent    Equipment Recommendations  3 in 1 bedside comode    Recommendations for Other Services       Precautions / Restrictions Precautions Precautions: None Restrictions Weight Bearing Restrictions: No      Mobility Bed Mobility               General bed mobility comments: not assessed  Transfers Overall transfer level: Needs assistance Transfers: Sit to/from Stand Sit to Stand: Supervision            Balance  Min guard for ambulation with straight cane.                        ADL Overall ADL's : Needs assistance/impaired     Grooming: Oral care;Set up;Supervision/safety;Standing               Lower Body Dressing: Set up;Supervision/safety;Sit to/from stand   Toilet Transfer: Min guard;Ambulation (cane; chair)           Functional mobility during ADLs: Min guard;Cane General ADL Comments: Explained role of OT. Educated on safety such as sitting for most of LB ADLs, rugs/items on floor, and safe shoewear. Discussed options for shower chair.     Vision  Pt wears glasses. History of bilateral eye surgery.   Perception     Praxis      Pertinent Vitals/Pain Pain Assessment: No/denies pain      Hand Dominance     Extremity/Trunk Assessment Upper Extremity Assessment Upper Extremity Assessment: RUE deficits/detail;LUE deficits/detail RUE  Deficits / Details: limited AROM shoulder flexion; reports arthritis LUE Deficits / Details: limited AROM shoulder flexion; reports arthritis and previous injury to left shoulder   Lower Extremity Assessment Lower Extremity Assessment: Defer to PT evaluation       Communication Communication Communication: No difficulties   Cognition Arousal/Alertness: Awake/alert Behavior During Therapy: WFL for tasks assessed/performed Overall Cognitive Status: Within Functional Limits for tasks assessed                     General Comments          Shoulder Instructions      Home Living Family/patient expects to be discharged to:: Private residence Living Arrangements: Alone Available Help at Discharge: Family;Available PRN/intermittently Type of Home: House Home Access: Stairs to enter Entrance Stairs-Number of Steps: 3   Home Layout: One level     Bathroom Shower/Tub: Walk-in shower;Door   Bathroom Toilet: Handicapped height (sink close)     Home Equipment: Cane - single point;Walker - 2 wheels;Wheelchair - manual   Additional Comments: pt's daughter and son both live nearby and grandson building a house next door      Prior Functioning/Environment Level of Independence: Independent with assistive device(s)        Comments: uses cane    OT Diagnosis: Generalized weakness   OT Problem List: Decreased activity tolerance;Decreased strength;Decreased range of motion;Impaired balance (  sitting and/or standing);Decreased knowledge of use of DME or AE;Decreased knowledge of precautions   OT Treatment/Interventions: Self-care/ADL training;DME and/or AE instruction;Therapeutic activities;Patient/family education;Balance training;Therapeutic exercise;Energy conservation    OT Goals(Current goals can be found in the care plan section) Acute Rehab OT Goals Patient Stated Goal: not stated OT Goal Formulation: With patient/family Time For Goal Achievement: 07/07/14 Potential  to Achieve Goals: Good ADL Goals Pt Will Perform Lower Body Bathing: with modified independence;sit to/from stand Pt Will Perform Lower Body Dressing: with modified independence;sit to/from stand Pt Will Transfer to Toilet: with modified independence;ambulating;grab bars (elevated toilet) Pt Will Perform Tub/Shower Transfer: Shower transfer;with modified independence;ambulating;3 in 1  OT Frequency: Min 2X/week   Barriers to D/C:            Co-evaluation              End of Session Equipment Utilized During Treatment: Gait belt;Other (comment) (cane)  Activity Tolerance: Patient tolerated treatment well Patient left: in chair;with call bell/phone within reach;with family/visitor present   Time: 8675-4492 OT Time Calculation (min): 15 min Charges:  OT General Charges $OT Visit: 1 Procedure OT Evaluation $Initial OT Evaluation Tier I: 1 Procedure G-CodesBenito Mccreedy OTR/L [id number] 06/30/2014, 1:55 PM

## 2014-06-30 NOTE — Progress Notes (Signed)
Patient ID: Cristina Middleton, female   DOB: 29-Jun-1920, 81 y.o.   MRN: [id number]    Subjective: Pt feels better today.  Passing some flatus and stool.  Mild nausea, but no abdominal pain  Objective: Vital signs in last 24 hours: Temp:  [98 F (36.7 C)-98.3 F (36.8 C)] 98 F (36.7 C) (02/29 0523) Pulse Rate:  [74-81] 81 (02/29 0523) Resp:  [18-19] 19 (02/29 0523) BP: (146-152)/(59-70) 151/59 mmHg (02/29 0523) SpO2:  [91 %-95 %] 91 % (02/29 0523) Last BM Date: 06/30/14  Intake/Output from previous day: 02/28 0701 - 02/29 0700 In: 1286.7 [I.V.:1286.7] Out: 1550 [Urine:1250; Stool:300] Intake/Output this shift: Total I/O In: -  Out: 100 [Urine:100]  PE: Abd: soft, NT, ND, +BS, NGT with no residual output  Lab Results:   Recent Labs  06/27/14 1832 06/28/14 0905  WBC 6.1 4.3  HGB 11.8* 11.3*  HCT 35.6* 34.4*  PLT 188 172   BMET  Recent Labs  06/29/14 0833 06/30/14 0527  NA 140 138  K 3.2* 3.4*  CL 102 102  CO2 27 27  GLUCOSE 84 99  BUN 10 7  CREATININE 0.85 0.78  CALCIUM 8.6 8.6   PT/INR No results for input(s): LABPROT, INR in the last 72 hours. CMP     Component Value Date/Time   NA 138 06/30/2014 0527   K 3.4* 06/30/2014 0527   CL 102 06/30/2014 0527   CO2 27 06/30/2014 0527   GLUCOSE 99 06/30/2014 0527   BUN 7 06/30/2014 0527   CREATININE 0.78 06/30/2014 0527   CALCIUM 8.6 06/30/2014 0527   PROT 5.8* 06/28/2014 0905   ALBUMIN 3.5 06/28/2014 0905   AST 24 06/28/2014 0905   ALT 20 06/28/2014 0905   ALKPHOS 100 06/28/2014 0905   BILITOT 0.4 06/28/2014 0905   GFRNONAA 71* 06/30/2014 0527   GFRAA 82* 06/30/2014 0527   Lipase     Component Value Date/Time   LIPASE 122* 04/13/2014 1710       Studies/Results: Dg Chest 2 View  06/30/2014   CLINICAL DATA:  Hiatal hernia and abdominal pain. Shortness of breath.  EXAM: CHEST  2 VIEW  COMPARISON:  06/27/2014  FINDINGS: Nasogastric tube has been placed. The nasogastric tube is within the hiatal  hernia. There is decreased gaseous distension of the hiatal hernia. Streaky densities at the left lung base are suggestive for atelectasis. Otherwise, the lungs are clear. Heart size is normal. Question small pleural effusions.  IMPRESSION: Decompression of the hiatal hernia with a nasogastric tube. The nasogastric tube is coiled within the hernia.  Basilar chest densities may represent a combination of atelectasis and small effusions.   Electronically Signed   By: Markus Daft M.D.   On: 06/30/2014 07:56   Dg Loyce Dys Tube Plc W/fl W/rad  06/28/2014   CLINICAL DATA:  NG tube placement.  Large hiatal hernia.  EXAM: NASO G TUBE PLACEMENT WITH FL AND WITH RAD  TECHNIQUE: Fluoroscopic guidance was used by the technologist and the radiologist for NG tube insertion  CONTRAST:  30 cc Omnipaque 300 injected via the NG tube at the into the procedure  FLUOROSCOPY TIME:  Radiation Exposure Index (as provided by the fluoroscopic device):  If the device does not provide the exposure index:  Fluoroscopy Time (in minutes and seconds): 8 min 0 seconds (7 min by with technologist, 1 min by radiologist).  Number of Acquired Images:  None  COMPARISON:  Under fluoroscopic guidance, the NG tube was manipulated into the  stomach within the large hiatal hernia. Despite multiple attempts and efforts, the NG tube could not be advanced through the hiatal hernia into the distal stomach or proximal small bowel. Contrast was injected at the end to verify placement in the stomach.  FINDINGS: NG tube placed into the mid portion of the stomach within the hiatal hernia. Unable to advance further.   Electronically Signed   By: Rolm Baptise M.D.   On: 06/28/2014 11:38    Anti-infectives: Anti-infectives    None       Assessment/Plan  1. Partial obstruction secondary to hiatal hernia -clear liquids.  Leave NGT clamped for now as this was very difficult to place -hopefully she can avoid an urgent/emergent operation, but if she fails  conservative managements she may require this -will follow   LOS: 3 days    Samier Jaco E 06/30/2014, 9:43 AM Pager: [phone]

## 2014-07-01 ENCOUNTER — Inpatient Hospital Stay (HOSPITAL_COMMUNITY): Payer: Medicare Other

## 2014-07-01 LAB — BASIC METABOLIC PANEL
Anion gap: 12 (ref 5–15)
BUN: 5 mg/dL — ABNORMAL LOW (ref 6–23)
CHLORIDE: 101 mmol/L (ref 96–112)
CO2: 25 mmol/L (ref 19–32)
CREATININE: 0.76 mg/dL (ref 0.50–1.10)
Calcium: 8.3 mg/dL — ABNORMAL LOW (ref 8.4–10.5)
GFR calc Af Amer: 83 mL/min — ABNORMAL LOW (ref >90)
GFR, EST NON AFRICAN AMERICAN: 72 mL/min — AB (ref >90)
Glucose, Bld: 120 mg/dL — ABNORMAL HIGH (ref 70–99)
Potassium: 3.4 mmol/L — ABNORMAL LOW (ref 3.5–5.1)
Sodium: 138 mmol/L (ref 135–145)

## 2014-07-01 LAB — GLUCOSE, CAPILLARY
GLUCOSE-CAPILLARY: 112 mg/dL — AB (ref 70–99)
Glucose-Capillary: 115 mg/dL — ABNORMAL HIGH (ref 70–99)
Glucose-Capillary: 91 mg/dL (ref 70–99)
Glucose-Capillary: 103 mg/dL — ABNORMAL HIGH (ref 70–99)

## 2014-07-01 LAB — HEMOGLOBIN A1C
HEMOGLOBIN A1C: 6.2 % — AB (ref 4.8–5.6)
Mean Plasma Glucose: 131 mg/dL

## 2014-07-01 MED ORDER — PANTOPRAZOLE SODIUM 40 MG PO TBEC
40.0000 mg | DELAYED_RELEASE_TABLET | Freq: Two times a day (BID) | ORAL | Status: DC
  Administered 2014-07-01 – 2014-07-02 (×2): 40 mg via ORAL
  Filled 2014-07-01 (×2): qty 1

## 2014-07-01 MED ORDER — GUAIFENESIN-DM 100-10 MG/5ML PO SYRP
5.0000 mL | ORAL_SOLUTION | ORAL | Status: DC | PRN
  Filled 2014-07-01: qty 5

## 2014-07-01 MED ORDER — LEVOTHYROXINE SODIUM 88 MCG PO TABS
88.0000 ug | ORAL_TABLET | Freq: Every day | ORAL | Status: DC
  Administered 2014-07-02: 88 ug via ORAL
  Filled 2014-07-01 (×2): qty 1

## 2014-07-01 MED ORDER — POTASSIUM CHLORIDE 10 MEQ/100ML IV SOLN
10.0000 meq | INTRAVENOUS | Status: AC
Start: 2014-07-01 — End: 2014-07-01
  Administered 2014-07-01 (×3): 10 meq via INTRAVENOUS
  Filled 2014-07-01 (×3): qty 100

## 2014-07-01 NOTE — Progress Notes (Signed)
Patient ID: Cristina Middleton  female  GEX:[id number]    DOB: 03-12-1921    DOA: 06/27/2014  PCP: Jani Gravel, MD   Brief history of present illness Cristina Middleton is a 81 y.o. female with past medical history of hypertension, hiatal hernia, GERD, hypothyroidism, diabetes mellitus, OSA, who presents with abdominal pain. The patient reported that she was having abdominal pain a few hours prior to admission, in the epigastric area started after she ate something. She took Xanax per EMS instruction with partial relief. She had nausea and vomited phlegm. No diarrhea. No fever or chills. She also had some mild chest pain. She reported that her pain feels like indigestion. She reported mild burning on urination, but no dysuria. Patient denied cough, diarrhea, skin rashes, joint pain or leg swelling. No unilateral weakness, numbness or tingling sensations. No vision change or hearing loss. In ED, patient was found to have small bowel obstruction on chest x-ray. No leukocytosis, potassium 3.0, negative troponin, temperature normal. She was admitted to inpatient for further evaluation treatment. General surgery was consulted by ED.  Assessment/Plan: Principal Problem:   Abdominal pain with SBO -Patient has been tolerating clear liquid diet. Chest x-ray obtained this morning showed improvement, surgery team following. - NG tube discontinued - Diet advanced to full liquids. The patient is however apprehensive about discharge today, wants to tolerate soft solids prior to DC, patient and family requesting to stay overnight  Active Problems: Coughing with congestion Chest x-ray reviewed, no pneumonia. Patient feels better with the nasal congestion after removing the NG tube  Essential Hypertension: -Continue hydralazine PRN IV for now, hold diuretics   Hyperlipidemia:  Hold oral Zocor, NPO  Diabetes mellitus: Patient is on metformin at home. A1c 6.5 on 05/25/12 Hemoglobin A1c 6.2, continue sliding scale  insulin  Hypothyroidism: Patient is on Synthroid at home. No A1c available in record TSH 3.93,changed to oral Synthroid   Hypokalemia:  -Repleted  DVT Prophylaxis:  Code Status: DO NOT RESUSCITATE  Family Communication: Discussed with patient's daughter at the bedside  Disposition:  Consultants:  General surgery  GI  Procedures:  none  Antibiotics:  none    Subjective: Patient seen and examined twice. Early this morning, was complaining of nasal congestion and coughing, sore throat. The symptoms improved after removing the NG tube. Patient feels relieved   Objective: Weight change:   Intake/Output Summary (Last 24 hours) at 07/01/14 1315 Last data filed at 07/01/14 0817  Gross per 24 hour  Intake   1620 ml  Output   1000 ml  Net    620 ml   Blood pressure 114/48, pulse 70, temperature 98 F (36.7 C), temperature source Oral, resp. rate 15, height 5\' 4"  (1.626 m), weight 69.3 kg (152 lb 12.5 oz), SpO2 91 %.  Physical Exam: General:  alert and oriented 3, NAD  CVS: S1-S2 clear Chest: CTAB Abdomen: soft NT, ND, NBS Extremities: no c/c/e bilaterally  Lab Results: Basic Metabolic Panel:  Recent Labs Lab 06/30/14 0527 07/01/14 0550  NA 138 138  K 3.4* 3.4*  CL 102 101  CO2 27 25  GLUCOSE 99 120*  BUN 7 5*  CREATININE 0.78 0.76  CALCIUM 8.6 8.3*   Liver Function Tests:  Recent Labs Lab 06/27/14 1832 06/28/14 0905  AST 30 24  ALT 23 20  ALKPHOS 113 100  BILITOT 0.5 0.4  PROT 6.5 5.8*  ALBUMIN 4.1 3.5   No results for input(s): LIPASE, AMYLASE in the last 168 hours.  No results for input(s): AMMONIA in the last 168 hours. CBC:  Recent Labs Lab 06/27/14 1832 06/28/14 0905  WBC 6.1 4.3  HGB 11.8* 11.3*  HCT 35.6* 34.4*  MCV 90.6 91.5  PLT 188 172   Cardiac Enzymes:  Recent Labs Lab 06/28/14 0905 06/28/14 1500  TROPONINI <0.03 <0.03   BNP: Invalid input(s): POCBNP CBG:  Recent Labs Lab 06/30/14 1138 06/30/14 1644  06/30/14 2210 07/01/14 0737 07/01/14 1210  GLUCAP 142* 133* 110* 115* 112*     Micro Results: Recent Results (from the past 240 hour(s))  Urine culture     Status: None   Collection Time: 06/28/14  4:56 AM  Result Value Ref Range Status   Specimen Description URINE, CLEAN CATCH  Final   Special Requests NONE  Final   Colony Count   Final    >=100,000 COLONIES/ML Performed at [facility] Performed at [facility]   Final   Report Status 06/29/2014 FINAL  Final    Studies/Results: Dg Chest 2 View  07/01/2014   CLINICAL DATA:  Cough and chest pain  EXAM: CHEST  2 VIEW  COMPARISON:  06/30/2014  FINDINGS: Cardiac shadow is stable. A large hiatal hernia is noted. A nasogastric catheter is seen coiled within the hiatal hernia. Mild left basilar atelectasis is seen. No new focal abnormality is noted.  IMPRESSION: Hiatal hernia with nasogastric catheter coiled within the hiatal hernia.  Left basilar changes stable from the prior study.   Electronically Signed   By: Inez Catalina M.D.   On: 07/01/2014 09:57   Dg Chest 2 View  06/30/2014   CLINICAL DATA:  Hiatal hernia and abdominal pain. Shortness of breath.  EXAM: CHEST  2 VIEW  COMPARISON:  06/27/2014  FINDINGS: Nasogastric tube has been placed. The nasogastric tube is within the hiatal hernia. There is decreased gaseous distension of the hiatal hernia. Streaky densities at the left lung base are suggestive for atelectasis. Otherwise, the lungs are clear. Heart size is normal. Question small pleural effusions.  IMPRESSION: Decompression of the hiatal hernia with a nasogastric tube. The nasogastric tube is coiled within the hernia.  Basilar chest densities may represent a combination of atelectasis and small effusions.   Electronically Signed   By: Markus Daft M.D.   On: 06/30/2014 07:56   Dg Chest 2 View  06/27/2014   CLINICAL DATA:  Pt reports having hx of hiatal hernia, having onset of abd pain at 3pm today   EXAM: CHEST  2 VIEW  COMPARISON:  04/13/2014  FINDINGS: Cardiac silhouette is stable. There is a large hiatal hernia. There 2 air-fluid levels associated with the large hiatal hernia. These air-fluid levels are long measuring 10 and 14 cm. Bibasilar atelectasis. No pulmonary edema.  No dilated loops of large or small bowel in the upper abdomen. There is kyphosis the spine with multiple levels of endplate spurring.  IMPRESSION: Large hiatal hernia with 2 Large air-fluid levels. No significant change from prior.   Electronically Signed   By: Suzy Bouchard M.D.   On: 06/27/2014 19:26   Ct Abdomen Pelvis W Contrast  06/28/2014   CLINICAL DATA:  Upper mid abdominal pain and back pain since 1500 hours. Nausea and vomiting. History of diabetes.  EXAM: CT ABDOMEN AND PELVIS WITH CONTRAST  TECHNIQUE: Multidetector CT imaging of the abdomen and pelvis was performed using the standard protocol following bolus administration of intravenous contrast.  CONTRAST:  169mL OMNIPAQUE IOHEXOL 300 MG/ML  SOLN  COMPARISON:  02/15/2012  FINDINGS: Atelectasis in the lung bases. Large esophageal hiatal hernia. Enteric tube is coiled in the distal esophagus. Coronary artery calcifications.  Diffuse fatty infiltration of the liver. The gallbladder, spleen, pancreas, adrenal glands, kidneys, inferior vena cava, and retroperitoneal lymph nodes are unremarkable. Calcification of aorta without aneurysm. Most of the stomach is in the hiatal hernia. Gastric malrotation is suggested. There is no evidence of gastric dilatation to suggest obstruction. No gastric wall thickening. Small bowel and colon are not abnormally distended. Gas and stool scattered throughout the colon. No free air or free fluid in the abdomen. Small umbilical hernia containing fat.  Pelvis: Diverticulosis of the sigmoid colon without evidence of diverticulitis. Appendix is surgically absent. Uterus and ovaries are not enlarged. Bladder wall is not thickened. No free or  loculated pelvic fluid collections. Degenerative changes in the spine. No destructive bone lesions. Scoliosis of the thorax scoliosis of the thoracic and lumbar spine is probably degenerative.  IMPRESSION: Large esophageal hiatal hernia hour with probable gastric malrotation. No evidence of volvulus or obstruction. Enteric tube is coiled in the distal esophagus. Diffuse degenerative changes in the thoracolumbar spine with scoliosis.   Electronically Signed   By: Lucienne Capers M.D.   On: 06/28/2014 02:07   Dg Abd Portable 1v  06/28/2014   CLINICAL DATA:  NG tube placement  EXAM: PORTABLE ABDOMEN - 1 VIEW  COMPARISON:  04/13/2014  FINDINGS: There is a large esophageal hiatal hernia. The enteric tube is shown coiled in what appears to be the distal esophagus at and above the level of the hiatal hernia. Shallow inspiration with atelectasis or infiltration in the left lung base. Mild cardiac enlargement.  IMPRESSION: Enteric tube is coiled in the distal esophagus above a large esophageal hiatal hernia.   Electronically Signed   By: Lucienne Capers M.D.   On: 06/28/2014 01:07   Dg [place] Dys Tube Plc W/fl W/rad  06/28/2014   CLINICAL DATA:  NG tube placement.  Large hiatal hernia.  EXAM: NASO G TUBE PLACEMENT WITH FL AND WITH RAD  TECHNIQUE: Fluoroscopic guidance was used by the technologist and the radiologist for NG tube insertion  CONTRAST:  30 cc Omnipaque 300 injected via the NG tube at the into the procedure  FLUOROSCOPY TIME:  Radiation Exposure Index (as provided by the fluoroscopic device):  If the device does not provide the exposure index:  Fluoroscopy Time (in minutes and seconds): 8 min 0 seconds (7 min by with technologist, 1 min by radiologist).  Number of Acquired Images:  None  COMPARISON:  Under fluoroscopic guidance, the NG tube was manipulated into the stomach within the large hiatal hernia. Despite multiple attempts and efforts, the NG tube could not be advanced through the hiatal hernia into  the distal stomach or proximal small bowel. Contrast was injected at the end to verify placement in the stomach.  FINDINGS: NG tube placed into the mid portion of the stomach within the hiatal hernia. Unable to advance further.   Electronically Signed   By: Rolm Baptise M.D.   On: 06/28/2014 11:38    Medications: Scheduled Meds: . acetaminophen  650 mg Oral QHS  . fluticasone  2 spray Each Nare Daily  . heparin  5,000 Units Subcutaneous 3 times per day  . insulin aspart  0-9 Units Subcutaneous TID WC  . [START ON 07/02/2014] levothyroxine  88 mcg Oral QAC breakfast  . pantoprazole  40 mg Oral BID  . potassium chloride  10 mEq Intravenous Q1 Hr x 3  . simvastatin  20 mg Oral QHS   Time spent 25 minutes   LOS: 4 days   Max Romano M.D. [facility] 07/01/2014, 1:15 PM Pager: [phone]  If 7PM-7AM, please contact night-coverage [email or web address] Password [email or web address]

## 2014-07-01 NOTE — Progress Notes (Signed)
Patient ID: Cristina Middleton, female   DOB: 08-05-1920, 81 y.o.   MRN: [id number]     [address] [place], [place] [address]    Phone: [phone] FAX: [phone]     Subjective: No n/v.  Passing flatus. 3BMs yesterday.  Coughing, non productive.    Objective:  Vital signs:  Filed Vitals:   06/30/14 0523 06/30/14 1322 06/30/14 2213 07/01/14 0507  BP: 151/59 146/65 150/59 114/48  Pulse: 81 79 71 70  Temp: 98 F (36.7 C) 97.7 F (36.5 C) 98 F (36.7 C) 98 F (36.7 C)  TempSrc: Oral Oral Oral Oral  Resp: '19 15 12 15  ' Height:      Weight:      SpO2: 91% 97% 92% 91%    Last BM Date: 06/30/14  Intake/Output   Yesterday:  02/29 0701 - 03/01 0700 In: 1860 [P.O.:1110; I.V.:750] Out: 1000 [Urine:1000] This shift:  Total I/O In: -  Out: 100 [Urine:100]   Physical Exam: General: Pt awake/alert/oriented x4 in no acute distress  Abdomen: Soft.  Nondistended.  Non tender.  No evidence of peritonitis.  No incarcerated hernias.   Problem List:   Principal Problem:   Abdominal pain Active Problems:   HTN (hypertension)   Sleep apnea   GERD (gastroesophageal reflux disease)   Hypothyroidism   Diabetes mellitus without complication   SOB (shortness of breath)   Encounter for nasogastric (NG) tube placement    Results:   Labs: Results for orders placed or performed during the hospital encounter of 06/27/14 (from the past 48 hour(s))  Glucose, capillary     Status: None   Collection Time: 06/29/14 12:20 PM  Result Value Ref Range   Glucose-Capillary 75 70 - 99 mg/dL  Glucose, capillary     Status: Abnormal   Collection Time: 06/29/14  6:52 PM  Result Value Ref Range   Glucose-Capillary 62 (L) 70 - 99 mg/dL  Glucose, capillary     Status: Abnormal   Collection Time: 06/29/14  8:16 PM  Result Value Ref Range   Glucose-Capillary 117 (H) 70 - 99 mg/dL  Glucose, capillary     Status: None   Collection Time: 06/29/14  9:55 PM  Result Value Ref Range   Glucose-Capillary 92 70 - 99 mg/dL  Glucose, capillary     Status: None   Collection Time: 06/30/14  3:00 AM  Result Value Ref Range   Glucose-Capillary 94 70 - 99 mg/dL  Basic metabolic panel     Status: Abnormal   Collection Time: 06/30/14  5:27 AM  Result Value Ref Range   Sodium 138 135 - 145 mmol/L   Potassium 3.4 (L) 3.5 - 5.1 mmol/L   Chloride 102 96 - 112 mmol/L   CO2 27 19 - 32 mmol/L   Glucose, Bld 99 70 - 99 mg/dL   BUN 7 6 - 23 mg/dL   Creatinine, Ser 0.78 0.50 - 1.10 mg/dL   Calcium 8.6 8.4 - 10.5 mg/dL   GFR calc non Af Amer 71 (L) >90 mL/min   GFR calc Af Amer 82 (L) >90 mL/min    Comment: (NOTE) The eGFR has been calculated using the CKD EPI equation. This calculation has not been validated in all clinical situations. eGFR's persistently <90 mL/min signify possible Chronic Kidney Disease.    Anion gap 9 5 - 15  TSH     Status: None   Collection Time:  06/30/14  5:27 AM  Result Value Ref Range   TSH 3.933 0.350 - 4.500 uIU/mL  Hemoglobin A1c     Status: Abnormal   Collection Time: 06/30/14  5:27 AM  Result Value Ref Range   Hgb A1c MFr Bld 6.2 (H) 4.8 - 5.6 %    Comment: (NOTE)         Pre-diabetes: 5.7 - 6.4         Diabetes: >6.4         Glycemic control for adults with diabetes: <7.0    Mean Plasma Glucose 131 mg/dL    Comment: (NOTE) Performed At: [facility] [place], [place] [id number] Lindon Romp MD NT:[id number]   Glucose, capillary     Status: Abnormal   Collection Time: 06/30/14  8:43 AM  Result Value Ref Range   Glucose-Capillary 123 (H) 70 - 99 mg/dL  Glucose, capillary     Status: Abnormal   Collection Time: 06/30/14 11:38 AM  Result Value Ref Range   Glucose-Capillary 142 (H) 70 - 99 mg/dL  Glucose, capillary     Status: Abnormal   Collection Time: 06/30/14  4:44 PM  Result Value Ref Range   Glucose-Capillary 133 (H) 70 - 99 mg/dL  Glucose,  capillary     Status: Abnormal   Collection Time: 06/30/14 10:10 PM  Result Value Ref Range   Glucose-Capillary 110 (H) 70 - 99 mg/dL   Comment 1 Notify RN    Comment 2 Documented in Char   Basic metabolic panel     Status: Abnormal   Collection Time: 07/01/14  5:50 AM  Result Value Ref Range   Sodium 138 135 - 145 mmol/L   Potassium 3.4 (L) 3.5 - 5.1 mmol/L   Chloride 101 96 - 112 mmol/L   CO2 25 19 - 32 mmol/L   Glucose, Bld 120 (H) 70 - 99 mg/dL   BUN 5 (L) 6 - 23 mg/dL   Creatinine, Ser 0.76 0.50 - 1.10 mg/dL   Calcium 8.3 (L) 8.4 - 10.5 mg/dL   GFR calc non Af Amer 72 (L) >90 mL/min   GFR calc Af Amer 83 (L) >90 mL/min    Comment: (NOTE) The eGFR has been calculated using the CKD EPI equation. This calculation has not been validated in all clinical situations. eGFR's persistently <90 mL/min signify possible Chronic Kidney Disease.    Anion gap 12 5 - 15  Glucose, capillary     Status: Abnormal   Collection Time: 07/01/14  7:37 AM  Result Value Ref Range   Glucose-Capillary 115 (H) 70 - 99 mg/dL    Imaging / Studies: Dg Chest 2 View  06/30/2014   CLINICAL DATA:  Hiatal hernia and abdominal pain. Shortness of breath.  EXAM: CHEST  2 VIEW  COMPARISON:  06/27/2014  FINDINGS: Nasogastric tube has been placed. The nasogastric tube is within the hiatal hernia. There is decreased gaseous distension of the hiatal hernia. Streaky densities at the left lung base are suggestive for atelectasis. Otherwise, the lungs are clear. Heart size is normal. Question small pleural effusions.  IMPRESSION: Decompression of the hiatal hernia with a nasogastric tube. The nasogastric tube is coiled within the hernia.  Basilar chest densities may represent a combination of atelectasis and small effusions.   Electronically Signed   By: Markus Daft M.D.   On: 06/30/2014 07:56    Medications / Allergies:  Scheduled Meds: . acetaminophen  650 mg Oral QHS  . fluticasone  2 spray Each Nare Daily  . heparin   5,000 Units Subcutaneous 3 times per day  . insulin aspart  0-9 Units Subcutaneous TID WC  . levothyroxine  44 mcg Intravenous Daily  . pantoprazole (PROTONIX) IV  40 mg Intravenous Q12H  . potassium chloride  10 mEq Intravenous Q1 Hr x 3  . simvastatin  20 mg Oral QHS   Continuous Infusions:  PRN Meds:.acetaminophen, guaiFENesin-dextromethorphan, hydrALAZINE, morphine injection, ondansetron **OR** ondansetron (ZOFRAN) IV  Antibiotics: Anti-infectives    None        Assessment/Plan Partial obstruction secondary to hiatal hernia -NGT clamped and tolerating clears.  Having BMs, abdominal exam is benign -DC NGT -advanced to fulls -may discharge from surgical standpoint if able to tolerate fulls   Erby Pian, [facility] Surgery Pager [phone](7A-4:30P) For consults and floor pages call [phone](7A-4:30P)  07/01/2014 9:42 AM

## 2014-07-01 NOTE — Care Management Note (Unsigned)
    Page 1 of 1   07/01/2014     12:56:45 PM CARE MANAGEMENT NOTE 07/01/2014  Patient:  Cristina Middleton, Cristina Middleton   Account Number:  [id number]  Date Initiated:  07/01/2014  Documentation initiated by:  GRAVES-BIGELOW,Senan Urey  Subjective/Objective Assessment:   Pt admitted for SOB. Lives alone. Plan to return home once stable for d/c.     Action/Plan:   Plan for d/c home with DME needs 3n1. Order is in [place]. No further needs from CM at this time.   Anticipated DC Date:  07/02/2014   Anticipated DC Plan:  HOME/SELF CARE      DC Planning Services  CM consult      PAC Choice  DURABLE MEDICAL EQUIPMENT   Choice offered to / List presented to:     DME arranged  3-N-1           Status of service:  Completed, signed off Medicare Important Message given?  YES (If response is "NO", the following Medicare IM given date fields will be blank) Date Medicare IM given:  06/30/2014 Medicare IM given by:  Tomi Bamberger Date Additional Medicare IM given:   Additional Medicare IM given by:    Discharge Disposition:  HOME/SELF CARE  Per UR Regulation:  Reviewed for med. necessity/level of care/duration of stay  If discussed at [place] of Stay Meetings, dates discussed:    Comments:

## 2014-07-01 NOTE — Progress Notes (Signed)
Patient ID: Cristina Middleton, female   DOB: [birth year], 81 y.o.   MRN: [id number] [facility]astroenterology Progress Note  Cristina Middleton EGBTDV 81 y.o. 1921-03-09   Subjective: Sitting in bedside chair. NG removed. Tolerating full liquids. Looking forward to soft food for dinner. Daughter at bedside.  Objective: Vital signs: Filed Vitals:   07/01/14 1425  BP: 140/69  Pulse: 78  Temp: 97.9 F (36.6 C)  Resp: 15    Physical Exam: Gen: alert, no acute distress, elderly  Lab Results:  Recent Labs  06/30/14 0527 07/01/14 0550  NA 138 138  K 3.4* 3.4*  CL 102 101  CO2 27 25  GLUCOSE 99 120*  BUN 7 5*  CREATININE 0.78 0.76  CALCIUM 8.6 8.3*   No results for input(s): AST, ALT, ALKPHOS, BILITOT, PROT, ALBUMIN in the last 72 hours. No results for input(s): WBC, NEUTROABS, HGB, HCT, MCV, PLT in the last 72 hours.    Assessment/Plan: Large hiatal hernia with partial obstruction that clinically is resolving - tolerating liquids and if tolerates soft food then agree with d/c tomorrow. F/U with Dr. Watt Climes prn. Will sign off. Call if questions.   [place] C. 07/01/2014, 4:03 PM

## 2014-07-01 NOTE — Progress Notes (Signed)
NGT was removed by MD order. Patient alert, oriented, denied any discomfort. Will continue to monitor.

## 2014-07-02 LAB — GLUCOSE, CAPILLARY: Glucose-Capillary: 116 mg/dL — ABNORMAL HIGH (ref 70–99)

## 2014-07-02 LAB — BASIC METABOLIC PANEL
ANION GAP: 5 (ref 5–15)
BUN: 6 mg/dL (ref 6–23)
CHLORIDE: 105 mmol/L (ref 96–112)
CO2: 27 mmol/L (ref 19–32)
Calcium: 8.7 mg/dL (ref 8.4–10.5)
Creatinine, Ser: 0.78 mg/dL (ref 0.50–1.10)
GFR calc non Af Amer: 71 mL/min — ABNORMAL LOW (ref >90)
GFR, EST AFRICAN AMERICAN: 82 mL/min — AB (ref >90)
Glucose, Bld: 94 mg/dL (ref 70–99)
POTASSIUM: 3.9 mmol/L (ref 3.5–5.1)
SODIUM: 137 mmol/L (ref 135–145)

## 2014-07-02 MED ORDER — GUAIFENESIN-DM 100-10 MG/5ML PO SYRP: 5.0000 mL | ORAL_SOLUTION | ORAL | Status: DC | PRN

## 2014-07-02 MED ORDER — FLUTICASONE PROPIONATE 50 MCG/ACT NA SUSP
2.0000 | Freq: Every day | NASAL | Status: DC | PRN
Start: 2014-07-02 — End: 2016-06-13

## 2014-07-02 NOTE — Progress Notes (Signed)
07/02/14 Patient going home today, IV site removed and discharge instructions reviewed with patient.

## 2014-07-02 NOTE — Progress Notes (Signed)
Physical Therapy Treatment Patient Details Name: Cristina Middleton MRN: [id number] DOB: 29-Jun-1920 Today's Date: 07/02/2014    History of Present Illness Cristina Middleton is a 81 y.o. female with past medical history of hypertension, hiatal hernia, GERD, hypothyroidism, diabetes mellitus, OSA, who presents with abdominal pain.    PT Comments    Pt progressing well towards physical therapy goals. Pt is able to ambulate well with the [facility], and today demonstrated the ability to negotiate 5 steps ascending and descending with min guard assist and use of rail/cane. She is demonstrating improved tolerance for functional activity, and declines any further PT follow-up at d/c. Will continue to follow and progress as able per POC.   Follow Up Recommendations  No PT follow up     Equipment Recommendations  None recommended by PT    Recommendations for Other Services       Precautions / Restrictions Precautions Precautions: None Restrictions Weight Bearing Restrictions: No    Mobility  Bed Mobility               General bed mobility comments: Pt sitting up in recliner upon PT arrival  Transfers Overall transfer level: Needs assistance Equipment used: Straight cane Transfers: Sit to/from Stand Sit to Stand: Supervision         General transfer comment: No physical assist required. No unsteadiness or LOB noted. Safe hand placement demonstrated while managing SPC.   Ambulation/Gait Ambulation/Gait assistance: Supervision Ambulation Distance (Feet): 200 Feet Assistive device: Straight cane Gait Pattern/deviations: Step-through pattern;Decreased stride length;Trunk flexed Gait velocity: decreased Gait velocity interpretation: Below normal speed for age/gender General Gait Details: Pt was able to ambulate well with the SPC. Proper sequencing with the cane and no LOB was noted.    Stairs Stairs: Yes Stairs assistance: Min guard Stair Management: One rail Right;With cane Number  of Stairs: 5 General stair comments: Pt was able to negotiate 5 steps ascending and descending without assistance. Pt moving generally slow and steady.   Wheelchair Mobility    Modified Rankin (Stroke Patients Only)       Balance Overall balance assessment: Needs assistance Sitting-balance support: Feet supported;No upper extremity supported Sitting balance-Leahy Scale: Good     Standing balance support: Single extremity supported;During functional activity Standing balance-Leahy Scale: Fair                      Cognition Arousal/Alertness: Awake/alert Behavior During Therapy: WFL for tasks assessed/performed Overall Cognitive Status: Within Functional Limits for tasks assessed       Memory: Decreased short-term memory              Exercises      General Comments        Pertinent Vitals/Pain Pain Assessment: No/denies pain    Home Living                      Prior Function            PT Goals (current goals can now be found in the care plan section) Acute Rehab PT Goals Patient Stated Goal: not stated PT Goal Formulation: With patient Time For Goal Achievement: 07/12/14 Potential to Achieve Goals: Good Progress towards PT goals: Progressing toward goals    Frequency  Min 3X/week    PT Plan Current plan remains appropriate    Co-evaluation             End of Session Equipment Utilized During Treatment: Gait belt Activity  Tolerance: Patient tolerated treatment well Patient left: in bed;with call bell/phone within reach;with family/visitor present     Time: 8676-7209 PT Time Calculation (min) (ACUTE ONLY): 17 min  Charges:  $Gait Training: 8-22 mins                    G Codes:      Rolinda Roan 07-08-14, 10:02 AM   Rolinda Roan, PT, DPT Acute Rehabilitation Services Pager: [phone]

## 2014-07-02 NOTE — Discharge Summary (Signed)
Physician Discharge Summary  Patient ID: Cristina Middleton MRN: [id number] DOB/AGE: 1920/05/13 81 y.o.  Admit date: 06/27/2014 Discharge date: 07/02/2014  Primary Care Physician:  Jani Gravel, MD  Discharge Diagnoses:    Nausea, vomiting resolved  Large hiatal hernia with partial obstruction resolved  . HTN (hypertension) . Sleep apnea . GERD (gastroesophageal reflux disease) . Hypothyroidism . SOB (shortness of breath) . Abdominal pain  Consults: Gastroenterology, Dr. Michail Sermon Gen. surgery, Dr. Donne Hazel   Recommendations for Outpatient Follow-up:  Patient was recommended for liquids or soft solids, small meals, sit upright during meals. May supplement with Ensure or boost. Follow up with gastroenterology as needed in 2 weeks   DIET: Soft solids    Allergies:   Allergies  Allergen Reactions  . Other Other (See Comments)    Alka Seltzer dissolving tabs-Possibly hallucinations  . Penicillins Hives, Itching and Swelling  . Sulfa Antibiotics Hives, Itching and Swelling     Discharge Medications:   Medication List    STOP taking these medications        HYDROcodone-acetaminophen 5-325 MG per tablet  Commonly known as:  NORCO      TAKE these medications        acetaminophen 325 MG tablet  Commonly known as:  TYLENOL  Take 2 tablets (650 mg total) by mouth every 6 (six) hours as needed (or Fever >/= 101).     acetaminophen 325 MG tablet  Commonly known as:  TYLENOL  Take 650 mg by mouth at bedtime.     ALPRAZolam 0.25 MG tablet  Commonly known as:  XANAX  Take 0.25 mg by mouth every morning.     aspirin EC 81 MG tablet  Take 81 mg by mouth daily.     bisacodyl 10 MG suppository  Commonly known as:  DULCOLAX  Place 1 suppository (10 mg total) rectally daily as needed.     CALCIUM 600 + D PO  Take 1 tablet by mouth daily.     CEREFOLIN PO  Take 1 tablet by mouth daily.     CINNAMON PO  Take 1,000 mg by mouth daily.     escitalopram 20 MG tablet   Commonly known as:  LEXAPRO  Take 20 mg by mouth daily.     fluticasone 50 MCG/ACT nasal spray  Commonly known as:  FLONASE  Place 2 sprays into both nostrils daily as needed for allergies or rhinitis.     guaiFENesin-dextromethorphan 100-10 MG/5ML syrup  Commonly known as:  ROBITUSSIN DM  Take 5 mLs by mouth every 4 (four) hours as needed for cough.     ICAPS MV Tabs  Take 1 tablet by mouth daily.     indapamide 2.5 MG tablet  Commonly known as:  LOZOL  Take 2.5 mg by mouth 3 (three) times a week. 3 days a week (Sunday, Wednesday, Friday)     levothyroxine 88 MCG tablet  Commonly known as:  SYNTHROID, LEVOTHROID  Take 88 mcg by mouth daily before breakfast.     loratadine 10 MG tablet  Commonly known as:  CLARITIN  Take 10 mg by mouth daily.     metFORMIN 500 MG tablet  Commonly known as:  GLUCOPHAGE  Take 250 mg by mouth daily with breakfast.     polyethylene glycol packet  Commonly known as:  MIRALAX / GLYCOLAX  Take 17 g by mouth daily as needed for moderate constipation.     RABEprazole 20 MG tablet  Commonly known as:  ACIPHEX  Take 40 mg by mouth daily.     simvastatin 20 MG tablet  Commonly known as:  ZOCOR  Take 20 mg by mouth at bedtime.     vitamin B-12 500 MCG tablet  Commonly known as:  CYANOCOBALAMIN  Take 500 mcg by mouth daily.         Brief H and P: For complete details please refer to admission H and P, but in brief Cristina Middleton is a 81 y.o. female with past medical history of hypertension, hiatal hernia, GERD, hypothyroidism, diabetes mellitus, OSA, who presents with abdominal pain. The patient reported that she was having abdominal pain a few hours prior to admission, in the epigastric area started after she ate something. She took Xanax per EMS instruction with partial relief. She had nausea and vomited phlegm. No diarrhea. No fever or chills. She also had some mild chest pain. She reported that her pain feels like indigestion. She reported  mild burning on urination, but no dysuria. Patient denied cough, diarrhea, skin rashes, joint pain or leg swelling. No unilateral weakness, numbness or tingling sensations. No vision change or hearing loss. In ED, patient was found to have small bowel obstruction on chest x-ray. No leukocytosis, potassium 3.0, negative troponin, temperature normal. She was admitted to inpatient for further evaluation treatment. General surgery was consulted by ED.  Hospital Course:  Abdominal pain with partially obstructed hiatal hernia - resolved Patient was admitted, CT abdomen and pelvis showed large esophageal hiatal hernia with probable gastric malrotation, no evidence of volvulus or obstruction. Gastroenterology and general surgery were consulted. NG tube was initially placed however have to be taken out as it was coiled in the hiatal hernia. Subsequently NG tube was placed with IR assistance to intermittent wall suction. Patient now has been tolerating soft solids, serial chest x-rays were obtained which showed improvement in the partially obstructed hiatal hernia.   Patient was recommended small frequent meals and sit upright, drink water after the meal or bites.  Coughing with congestion Chest x-ray reviewed, no pneumonia. Patient feels better with the nasal congestion after removing the NG tube  Essential Hypertension: -As patient had NG tube, oriented antihypertensive was held and patient was placed on IV hydralazine as needed  Hyperlipidemia:  Restart Zocor  Diabetes mellitus: Patient is on metformin at home. A1c 6.5 on 05/25/12 Hemoglobin A1c 6.2, please follow hemoglobin A1c closely, continue metformin  Hypothyroidism: Patient is on Synthroid at home.  TSH 3.93 continuel Synthroid    Day of Discharge BP 146/63 mmHg  Pulse 69  Temp(Src) 97.7 F (36.5 C) (Oral)  Resp 20  Ht 5\' 4"  (1.626 m)  Wt 69.3 kg (152 lb 12.5 oz)  BMI 26.21 kg/m2  SpO2 93%  Physical Exam: General: Alert and awake  oriented x3 not in any acute distress. HEENT: anicteric sclera, pupils reactive to light and accommodation CVS: S1-S2 clear no murmur rubs or gallops Chest: clear to auscultation bilaterally, no wheezing rales or rhonchi Abdomen: soft nontender, nondistended, normal bowel sounds Extremities: no cyanosis, clubbing or edema noted bilaterally Neuro: Cranial nerves II-XII intact, no focal neurological deficits   The results of significant diagnostics from this hospitalization (including imaging, microbiology, ancillary and laboratory) are listed below for reference.    LAB RESULTS: Basic Metabolic Panel:  Recent Labs Lab 07/01/14 0550 07/02/14 0530  NA 138 137  K 3.4* 3.9  CL 101 105  CO2 25 27  GLUCOSE 120* 94  BUN 5* 6  CREATININE 0.76 0.78  CALCIUM 8.3* 8.7   Liver Function Tests:  Recent Labs Lab 06/27/14 1832 06/28/14 0905  AST 30 24  ALT 23 20  ALKPHOS 113 100  BILITOT 0.5 0.4  PROT 6.5 5.8*  ALBUMIN 4.1 3.5   No results for input(s): LIPASE, AMYLASE in the last 168 hours. No results for input(s): AMMONIA in the last 168 hours. CBC:  Recent Labs Lab 06/27/14 1832 06/28/14 0905  WBC 6.1 4.3  HGB 11.8* 11.3*  HCT 35.6* 34.4*  MCV 90.6 91.5  PLT 188 172   Cardiac Enzymes:  Recent Labs Lab 06/28/14 0905 06/28/14 1500  TROPONINI <0.03 <0.03   BNP: Invalid input(s): POCBNP CBG:  Recent Labs Lab 07/01/14 2118 07/02/14 0759  GLUCAP 103* 116*    Significant Diagnostic Studies:  Dg Chest 2 View  06/27/2014   CLINICAL DATA:  Pt reports having hx of hiatal hernia, having onset of abd pain at 3pm today  EXAM: CHEST  2 VIEW  COMPARISON:  04/13/2014  FINDINGS: Cardiac silhouette is stable. There is a large hiatal hernia. There 2 air-fluid levels associated with the large hiatal hernia. These air-fluid levels are long measuring 10 and 14 cm. Bibasilar atelectasis. No pulmonary edema.  No dilated loops of large or small bowel in the upper abdomen. There is  kyphosis the spine with multiple levels of endplate spurring.  IMPRESSION: Large hiatal hernia with 2 Large air-fluid levels. No significant change from prior.   Electronically Signed   By: Suzy Bouchard M.D.   On: 06/27/2014 19:26   Ct Abdomen Pelvis W Contrast  06/28/2014   CLINICAL DATA:  Upper mid abdominal pain and back pain since 1500 hours. Nausea and vomiting. History of diabetes.  EXAM: CT ABDOMEN AND PELVIS WITH CONTRAST  TECHNIQUE: Multidetector CT imaging of the abdomen and pelvis was performed using the standard protocol following bolus administration of intravenous contrast.  CONTRAST:  120mL OMNIPAQUE IOHEXOL 300 MG/ML  SOLN  COMPARISON:  02/15/2012  FINDINGS: Atelectasis in the lung bases. Large esophageal hiatal hernia. Enteric tube is coiled in the distal esophagus. Coronary artery calcifications.  Diffuse fatty infiltration of the liver. The gallbladder, spleen, pancreas, adrenal glands, kidneys, inferior vena cava, and retroperitoneal lymph nodes are unremarkable. Calcification of aorta without aneurysm. Most of the stomach is in the hiatal hernia. Gastric malrotation is suggested. There is no evidence of gastric dilatation to suggest obstruction. No gastric wall thickening. Small bowel and colon are not abnormally distended. Gas and stool scattered throughout the colon. No free air or free fluid in the abdomen. Small umbilical hernia containing fat.  Pelvis: Diverticulosis of the sigmoid colon without evidence of diverticulitis. Appendix is surgically absent. Uterus and ovaries are not enlarged. Bladder wall is not thickened. No free or loculated pelvic fluid collections. Degenerative changes in the spine. No destructive bone lesions. Scoliosis of the thorax scoliosis of the thoracic and lumbar spine is probably degenerative.  IMPRESSION: Large esophageal hiatal hernia hour with probable gastric malrotation. No evidence of volvulus or obstruction. Enteric tube is coiled in the distal  esophagus. Diffuse degenerative changes in the thoracolumbar spine with scoliosis.   Electronically Signed   By: Lucienne Capers M.D.   On: 06/28/2014 02:07   Dg Abd Portable 1v  06/28/2014   CLINICAL DATA:  NG tube placement  EXAM: PORTABLE ABDOMEN - 1 VIEW  COMPARISON:  04/13/2014  FINDINGS: There is a large esophageal hiatal hernia. The enteric tube is shown coiled in what appears to be the distal esophagus  at and above the level of the hiatal hernia. Shallow inspiration with atelectasis or infiltration in the left lung base. Mild cardiac enlargement.  IMPRESSION: Enteric tube is coiled in the distal esophagus above a large esophageal hiatal hernia.   Electronically Signed   By: Lucienne Capers M.D.   On: 06/28/2014 01:07   Dg Loyce Dys Tube Plc W/fl W/rad  06/28/2014   CLINICAL DATA:  NG tube placement.  Large hiatal hernia.  EXAM: NASO G TUBE PLACEMENT WITH FL AND WITH RAD  TECHNIQUE: Fluoroscopic guidance was used by the technologist and the radiologist for NG tube insertion  CONTRAST:  30 cc Omnipaque 300 injected via the NG tube at the into the procedure  FLUOROSCOPY TIME:  Radiation Exposure Index (as provided by the fluoroscopic device):  If the device does not provide the exposure index:  Fluoroscopy Time (in minutes and seconds): 8 min 0 seconds (7 min by with technologist, 1 min by radiologist).  Number of Acquired Images:  None  COMPARISON:  Under fluoroscopic guidance, the NG tube was manipulated into the stomach within the large hiatal hernia. Despite multiple attempts and efforts, the NG tube could not be advanced through the hiatal hernia into the distal stomach or proximal small bowel. Contrast was injected at the end to verify placement in the stomach.  FINDINGS: NG tube placed into the mid portion of the stomach within the hiatal hernia. Unable to advance further.   Electronically Signed   By: Rolm Baptise M.D.   On: 06/28/2014 11:38    2D ECHO:   Disposition and Follow-up:      Discharge Instructions    Diet - low sodium heart healthy    Complete by:  As directed      Discharge instructions    Complete by:  As directed   Please eat smaller (can be frequent) meals, smaller bites, can drink water after bite. Always sit upright while eating. You can drink ENSURE or BOOST nutritional supplements twice a day between meals to keep up the strength.     Increase activity slowly    Complete by:  As directed             DISPOSITION: Home  DISCHARGE FOLLOW-UP Follow-up Information    Follow up with [facility], MD. Schedule an appointment as soon as possible for a visit in 2 weeks.   Specialty:  Gastroenterology   Why:  for hospital follow-up, As needed   Contact information:   [address], [place] [address] [phone]       Follow up with Jani Gravel, MD. Schedule an appointment as soon as possible for a visit in 10 days.   Specialty:  Internal Medicine   Why:  for hospital follow-up   Contact information:   [address] [place]  [address] [phone]        Time spent on Discharge: 35 minutes  Signed:   Flara Storti M.D. [facility] 07/02/2014, 10:57 AM Pager: [phone]

## 2014-07-15 DIAGNOSIS — H1859 Other hereditary corneal dystrophies: Secondary | ICD-10-CM | POA: Diagnosis not present

## 2014-07-15 DIAGNOSIS — H02834 Dermatochalasis of left upper eyelid: Secondary | ICD-10-CM | POA: Diagnosis not present

## 2014-07-15 DIAGNOSIS — H02831 Dermatochalasis of right upper eyelid: Secondary | ICD-10-CM | POA: Diagnosis not present

## 2014-07-15 DIAGNOSIS — H532 Diplopia: Secondary | ICD-10-CM | POA: Diagnosis not present

## 2014-07-15 DIAGNOSIS — Z961 Presence of intraocular lens: Secondary | ICD-10-CM | POA: Diagnosis not present

## 2014-07-24 DIAGNOSIS — R933 Abnormal findings on diagnostic imaging of other parts of digestive tract: Secondary | ICD-10-CM | POA: Diagnosis not present

## 2014-07-24 DIAGNOSIS — K449 Diaphragmatic hernia without obstruction or gangrene: Secondary | ICD-10-CM | POA: Diagnosis not present

## 2014-07-31 DIAGNOSIS — E119 Type 2 diabetes mellitus without complications: Secondary | ICD-10-CM | POA: Diagnosis not present

## 2014-07-31 DIAGNOSIS — E039 Hypothyroidism, unspecified: Secondary | ICD-10-CM | POA: Diagnosis not present

## 2014-07-31 DIAGNOSIS — I10 Essential (primary) hypertension: Secondary | ICD-10-CM | POA: Diagnosis not present

## 2014-08-05 ENCOUNTER — Other Ambulatory Visit: Payer: Self-pay | Admitting: Internal Medicine

## 2014-08-05 DIAGNOSIS — H532 Diplopia: Secondary | ICD-10-CM | POA: Diagnosis not present

## 2014-08-05 DIAGNOSIS — E119 Type 2 diabetes mellitus without complications: Secondary | ICD-10-CM | POA: Diagnosis not present

## 2014-08-05 DIAGNOSIS — I10 Essential (primary) hypertension: Secondary | ICD-10-CM | POA: Diagnosis not present

## 2014-08-05 DIAGNOSIS — E780 Pure hypercholesterolemia: Secondary | ICD-10-CM | POA: Diagnosis not present

## 2014-08-07 DIAGNOSIS — H02834 Dermatochalasis of left upper eyelid: Secondary | ICD-10-CM | POA: Diagnosis not present

## 2014-08-07 DIAGNOSIS — H43811 Vitreous degeneration, right eye: Secondary | ICD-10-CM | POA: Diagnosis not present

## 2014-08-07 DIAGNOSIS — H02831 Dermatochalasis of right upper eyelid: Secondary | ICD-10-CM | POA: Diagnosis not present

## 2014-08-07 DIAGNOSIS — Z961 Presence of intraocular lens: Secondary | ICD-10-CM | POA: Diagnosis not present

## 2014-08-07 DIAGNOSIS — H3531 Nonexudative age-related macular degeneration: Secondary | ICD-10-CM | POA: Diagnosis not present

## 2014-08-16 ENCOUNTER — Ambulatory Visit
Admission: RE | Admit: 2014-08-16 | Discharge: 2014-08-16 | Disposition: A | Discharge status: Home / Self Care | Payer: Medicare Other | Source: Ambulatory Visit | Attending: Internal Medicine | Admitting: Internal Medicine

## 2014-08-16 DIAGNOSIS — H532 Diplopia: Secondary | ICD-10-CM | POA: Diagnosis not present

## 2014-08-20 DIAGNOSIS — Z1231 Encounter for screening mammogram for malignant neoplasm of breast: Secondary | ICD-10-CM | POA: Diagnosis not present

## 2014-08-20 DIAGNOSIS — Z803 Family history of malignant neoplasm of breast: Secondary | ICD-10-CM | POA: Diagnosis not present

## 2014-09-01 DIAGNOSIS — H532 Diplopia: Secondary | ICD-10-CM | POA: Diagnosis not present

## 2014-09-01 DIAGNOSIS — E118 Type 2 diabetes mellitus with unspecified complications: Secondary | ICD-10-CM | POA: Diagnosis not present

## 2014-09-08 DIAGNOSIS — E119 Type 2 diabetes mellitus without complications: Secondary | ICD-10-CM | POA: Diagnosis not present

## 2014-09-08 DIAGNOSIS — I10 Essential (primary) hypertension: Secondary | ICD-10-CM | POA: Diagnosis not present

## 2014-09-08 DIAGNOSIS — E039 Hypothyroidism, unspecified: Secondary | ICD-10-CM | POA: Diagnosis not present

## 2014-09-08 DIAGNOSIS — E780 Pure hypercholesterolemia: Secondary | ICD-10-CM | POA: Diagnosis not present

## 2014-09-18 DIAGNOSIS — H02834 Dermatochalasis of left upper eyelid: Secondary | ICD-10-CM | POA: Diagnosis not present

## 2014-09-18 DIAGNOSIS — H53452 Other localized visual field defect, left eye: Secondary | ICD-10-CM | POA: Diagnosis not present

## 2014-09-23 DIAGNOSIS — E119 Type 2 diabetes mellitus without complications: Secondary | ICD-10-CM | POA: Diagnosis not present

## 2014-09-23 DIAGNOSIS — E785 Hyperlipidemia, unspecified: Secondary | ICD-10-CM | POA: Diagnosis not present

## 2014-09-23 DIAGNOSIS — I10 Essential (primary) hypertension: Secondary | ICD-10-CM | POA: Diagnosis not present

## 2014-11-24 DIAGNOSIS — F419 Anxiety disorder, unspecified: Secondary | ICD-10-CM | POA: Diagnosis not present

## 2014-11-24 DIAGNOSIS — R300 Dysuria: Secondary | ICD-10-CM | POA: Diagnosis not present

## 2014-12-04 DIAGNOSIS — H524 Presbyopia: Secondary | ICD-10-CM | POA: Diagnosis not present

## 2015-01-28 DIAGNOSIS — K449 Diaphragmatic hernia without obstruction or gangrene: Secondary | ICD-10-CM | POA: Diagnosis not present

## 2015-01-28 DIAGNOSIS — R1310 Dysphagia, unspecified: Secondary | ICD-10-CM | POA: Diagnosis not present

## 2015-02-25 DIAGNOSIS — Z23 Encounter for immunization: Secondary | ICD-10-CM | POA: Diagnosis not present

## 2015-03-05 DIAGNOSIS — E119 Type 2 diabetes mellitus without complications: Secondary | ICD-10-CM | POA: Diagnosis not present

## 2015-03-05 DIAGNOSIS — I10 Essential (primary) hypertension: Secondary | ICD-10-CM | POA: Diagnosis not present

## 2015-03-05 DIAGNOSIS — E039 Hypothyroidism, unspecified: Secondary | ICD-10-CM | POA: Diagnosis not present

## 2015-03-09 DIAGNOSIS — I10 Essential (primary) hypertension: Secondary | ICD-10-CM | POA: Diagnosis not present

## 2015-03-09 DIAGNOSIS — E785 Hyperlipidemia, unspecified: Secondary | ICD-10-CM | POA: Diagnosis not present

## 2015-03-09 DIAGNOSIS — E118 Type 2 diabetes mellitus with unspecified complications: Secondary | ICD-10-CM | POA: Diagnosis not present

## 2015-03-11 DIAGNOSIS — E119 Type 2 diabetes mellitus without complications: Secondary | ICD-10-CM | POA: Diagnosis not present

## 2015-03-11 DIAGNOSIS — I10 Essential (primary) hypertension: Secondary | ICD-10-CM | POA: Diagnosis not present

## 2015-03-11 DIAGNOSIS — E7800 Pure hypercholesterolemia, unspecified: Secondary | ICD-10-CM | POA: Diagnosis not present

## 2015-04-08 DIAGNOSIS — R399 Unspecified symptoms and signs involving the genitourinary system: Secondary | ICD-10-CM | POA: Diagnosis not present

## 2015-04-08 DIAGNOSIS — F0390 Unspecified dementia without behavioral disturbance: Secondary | ICD-10-CM | POA: Diagnosis not present

## 2015-04-13 DIAGNOSIS — I10 Essential (primary) hypertension: Secondary | ICD-10-CM | POA: Diagnosis not present

## 2015-04-13 DIAGNOSIS — E119 Type 2 diabetes mellitus without complications: Secondary | ICD-10-CM | POA: Diagnosis not present

## 2015-04-13 DIAGNOSIS — F0390 Unspecified dementia without behavioral disturbance: Secondary | ICD-10-CM | POA: Diagnosis not present

## 2015-04-13 DIAGNOSIS — R4182 Altered mental status, unspecified: Secondary | ICD-10-CM | POA: Diagnosis not present

## 2015-05-13 DIAGNOSIS — I10 Essential (primary) hypertension: Secondary | ICD-10-CM | POA: Diagnosis not present

## 2015-05-13 DIAGNOSIS — E7800 Pure hypercholesterolemia, unspecified: Secondary | ICD-10-CM | POA: Diagnosis not present

## 2015-05-13 DIAGNOSIS — F0390 Unspecified dementia without behavioral disturbance: Secondary | ICD-10-CM | POA: Diagnosis not present

## 2015-06-09 DIAGNOSIS — H353130 Nonexudative age-related macular degeneration, bilateral, stage unspecified: Secondary | ICD-10-CM | POA: Diagnosis not present

## 2015-06-09 DIAGNOSIS — Z961 Presence of intraocular lens: Secondary | ICD-10-CM | POA: Diagnosis not present

## 2015-06-09 DIAGNOSIS — H43811 Vitreous degeneration, right eye: Secondary | ICD-10-CM | POA: Diagnosis not present

## 2015-06-09 DIAGNOSIS — H02831 Dermatochalasis of right upper eyelid: Secondary | ICD-10-CM | POA: Diagnosis not present

## 2015-06-09 DIAGNOSIS — H02834 Dermatochalasis of left upper eyelid: Secondary | ICD-10-CM | POA: Diagnosis not present

## 2015-06-09 DIAGNOSIS — H40013 Open angle with borderline findings, low risk, bilateral: Secondary | ICD-10-CM | POA: Diagnosis not present

## 2015-06-24 DIAGNOSIS — E039 Hypothyroidism, unspecified: Secondary | ICD-10-CM | POA: Diagnosis not present

## 2015-06-24 DIAGNOSIS — I10 Essential (primary) hypertension: Secondary | ICD-10-CM | POA: Diagnosis not present

## 2015-06-24 DIAGNOSIS — E119 Type 2 diabetes mellitus without complications: Secondary | ICD-10-CM | POA: Diagnosis not present

## 2015-06-24 DIAGNOSIS — H6691 Otitis media, unspecified, right ear: Secondary | ICD-10-CM | POA: Diagnosis not present

## 2015-08-21 DIAGNOSIS — Z803 Family history of malignant neoplasm of breast: Secondary | ICD-10-CM | POA: Diagnosis not present

## 2015-08-21 DIAGNOSIS — Z1231 Encounter for screening mammogram for malignant neoplasm of breast: Secondary | ICD-10-CM | POA: Diagnosis not present

## 2015-08-31 DIAGNOSIS — E785 Hyperlipidemia, unspecified: Secondary | ICD-10-CM | POA: Diagnosis not present

## 2015-08-31 DIAGNOSIS — I10 Essential (primary) hypertension: Secondary | ICD-10-CM | POA: Diagnosis not present

## 2015-08-31 DIAGNOSIS — E118 Type 2 diabetes mellitus with unspecified complications: Secondary | ICD-10-CM | POA: Diagnosis not present

## 2015-09-07 DIAGNOSIS — E119 Type 2 diabetes mellitus without complications: Secondary | ICD-10-CM | POA: Diagnosis not present

## 2015-09-09 DIAGNOSIS — E119 Type 2 diabetes mellitus without complications: Secondary | ICD-10-CM | POA: Diagnosis not present

## 2015-09-09 DIAGNOSIS — E039 Hypothyroidism, unspecified: Secondary | ICD-10-CM | POA: Diagnosis not present

## 2015-09-09 DIAGNOSIS — F0390 Unspecified dementia without behavioral disturbance: Secondary | ICD-10-CM | POA: Diagnosis not present

## 2015-09-09 DIAGNOSIS — I10 Essential (primary) hypertension: Secondary | ICD-10-CM | POA: Diagnosis not present

## 2015-12-08 DIAGNOSIS — E039 Hypothyroidism, unspecified: Secondary | ICD-10-CM | POA: Diagnosis not present

## 2015-12-08 DIAGNOSIS — E118 Type 2 diabetes mellitus with unspecified complications: Secondary | ICD-10-CM | POA: Diagnosis not present

## 2015-12-08 DIAGNOSIS — I10 Essential (primary) hypertension: Secondary | ICD-10-CM | POA: Diagnosis not present

## 2015-12-21 DIAGNOSIS — H353131 Nonexudative age-related macular degeneration, bilateral, early dry stage: Secondary | ICD-10-CM | POA: Diagnosis not present

## 2015-12-21 DIAGNOSIS — Z961 Presence of intraocular lens: Secondary | ICD-10-CM | POA: Diagnosis not present

## 2015-12-21 DIAGNOSIS — H40013 Open angle with borderline findings, low risk, bilateral: Secondary | ICD-10-CM | POA: Diagnosis not present

## 2015-12-21 DIAGNOSIS — H02831 Dermatochalasis of right upper eyelid: Secondary | ICD-10-CM | POA: Diagnosis not present

## 2015-12-21 DIAGNOSIS — H02834 Dermatochalasis of left upper eyelid: Secondary | ICD-10-CM | POA: Diagnosis not present

## 2015-12-21 DIAGNOSIS — H43811 Vitreous degeneration, right eye: Secondary | ICD-10-CM | POA: Diagnosis not present

## 2015-12-31 DIAGNOSIS — Z23 Encounter for immunization: Secondary | ICD-10-CM | POA: Diagnosis not present

## 2015-12-31 DIAGNOSIS — E039 Hypothyroidism, unspecified: Secondary | ICD-10-CM | POA: Diagnosis not present

## 2015-12-31 DIAGNOSIS — E118 Type 2 diabetes mellitus with unspecified complications: Secondary | ICD-10-CM | POA: Diagnosis not present

## 2015-12-31 DIAGNOSIS — D649 Anemia, unspecified: Secondary | ICD-10-CM | POA: Diagnosis not present

## 2015-12-31 DIAGNOSIS — I10 Essential (primary) hypertension: Secondary | ICD-10-CM | POA: Diagnosis not present

## 2016-04-11 DIAGNOSIS — H10023 Other mucopurulent conjunctivitis, bilateral: Secondary | ICD-10-CM | POA: Diagnosis not present

## 2016-04-18 DIAGNOSIS — H1859 Other hereditary corneal dystrophies: Secondary | ICD-10-CM | POA: Diagnosis not present

## 2016-04-18 DIAGNOSIS — H02834 Dermatochalasis of left upper eyelid: Secondary | ICD-10-CM | POA: Diagnosis not present

## 2016-04-18 DIAGNOSIS — H10023 Other mucopurulent conjunctivitis, bilateral: Secondary | ICD-10-CM | POA: Diagnosis not present

## 2016-04-18 DIAGNOSIS — H40013 Open angle with borderline findings, low risk, bilateral: Secondary | ICD-10-CM | POA: Diagnosis not present

## 2016-04-18 DIAGNOSIS — H43811 Vitreous degeneration, right eye: Secondary | ICD-10-CM | POA: Diagnosis not present

## 2016-04-18 DIAGNOSIS — H02831 Dermatochalasis of right upper eyelid: Secondary | ICD-10-CM | POA: Diagnosis not present

## 2016-04-18 DIAGNOSIS — H353130 Nonexudative age-related macular degeneration, bilateral, stage unspecified: Secondary | ICD-10-CM | POA: Diagnosis not present

## 2016-04-18 DIAGNOSIS — Z961 Presence of intraocular lens: Secondary | ICD-10-CM | POA: Diagnosis not present

## 2016-05-16 DIAGNOSIS — Z9989 Dependence on other enabling machines and devices: Secondary | ICD-10-CM | POA: Diagnosis not present

## 2016-05-16 DIAGNOSIS — G4733 Obstructive sleep apnea (adult) (pediatric): Secondary | ICD-10-CM | POA: Diagnosis not present

## 2016-06-07 ENCOUNTER — Ambulatory Visit (HOSPITAL_COMMUNITY)
Admission: RE | Admit: 2016-06-07 | Discharge: 2016-06-07 | Disposition: A | Discharge status: Home / Self Care | Payer: Medicare Other | Source: Ambulatory Visit | Attending: Vascular Surgery | Admitting: Vascular Surgery

## 2016-06-07 ENCOUNTER — Ambulatory Visit (INDEPENDENT_AMBULATORY_CARE_PROVIDER_SITE_OTHER): Payer: Medicare Other | Admitting: Vascular Surgery

## 2016-06-07 ENCOUNTER — Encounter: Payer: Self-pay | Admitting: Vascular Surgery

## 2016-06-07 ENCOUNTER — Other Ambulatory Visit: Payer: Self-pay | Admitting: *Deleted

## 2016-06-07 VITALS — BP 122/73 | HR 81 | Temp 99.0°F | Resp 16 | Ht 64.0 in | Wt 142.8 lb

## 2016-06-07 DIAGNOSIS — I83892 Varicose veins of left lower extremities with other complications: Secondary | ICD-10-CM

## 2016-06-07 DIAGNOSIS — I83812 Varicose veins of left lower extremities with pain: Secondary | ICD-10-CM

## 2016-06-07 NOTE — Progress Notes (Signed)
Subjective:     Patient ID: FLONNIE FELT, female   DOB: Sep 19, 1920, 83 y.o.   MRN: [id number]  HPI  This 83 year old female was referred by Dr. Jani Gravel for evaluation of a left leg varicose vein previously ruptured. The patient has had no previous treatment of the veins. Patient lives alone and had spontaneous bleeding from a prominent varix in the left lateral lower leg a few days ago. She called EMS and they were able to control the bleeding without going to the emergency department. Patient does not wear elastic compression stockings. She has no history of DVT thrombophlebitis stasis ulcers or bleeding. She is very concerned about recurrent bleeding since she is 76 years all lives alone. She has aching and throbbing discomfort in the legs as the day progresses.  Past Medical History:  Diagnosis Date  . Anxiety   . Arthritis   . Diabetes mellitus without complication ([place])   . Dislocated shoulder    WAS POPPED BACK IN PLACE  2006???  . GERD (gastroesophageal reflux disease)    TAKES  ACIPHEX  . Hypertension   . Hyperthyroidism    NOT SURE WHICH ONE  . PONV (postoperative nausea and vomiting)    YRS AGO.....(6-7 YRS)  . Sleep apnea    WEARS MASK EVERY NOW AND THEN    Social History  Substance Use Topics  . Smoking status: Never Smoker  . Smokeless tobacco: Never Used  . Alcohol use No    Family History  Problem Relation Age of Onset  . Other Mother   . Other Father     Allergies  Allergen Reactions  . Other Other (See Comments)    Alka Seltzer dissolving tabs-Possibly hallucinations  . Penicillins Hives, Itching and Swelling  . Sulfa Antibiotics Hives, Itching and Swelling     Current Outpatient Prescriptions:  .  acetaminophen (TYLENOL) 325 MG tablet, Take 650 mg by mouth at bedtime., Disp: , Rfl:  .  ALPRAZolam (XANAX) 0.25 MG tablet, Take 0.25 mg by mouth every morning., Disp: , Rfl:  .  aspirin EC 81 MG tablet, Take 81 mg by mouth daily., Disp: , Rfl:  .   Calcium Carb-Cholecalciferol (CALCIUM 600 + D PO), Take 1 tablet by mouth daily., Disp: , Rfl:  .  CINNAMON PO, Take 1,000 mg by mouth daily., Disp: , Rfl:  .  escitalopram (LEXAPRO) 20 MG tablet, Take 20 mg by mouth daily., Disp: , Rfl:  .  guaiFENesin-dextromethorphan (ROBITUSSIN DM) 100-10 MG/5ML syrup, Take 5 mLs by mouth every 4 (four) hours as needed for cough., Disp: 118 mL, Rfl: 0 .  indapamide (LOZOL) 2.5 MG tablet, Take 2.5 mg by mouth 3 (three) times a week. 3 days a week (Sunday, Wednesday, Friday), Disp: , Rfl:  .  L-Methylfolate-B12-B6-B2 (CEREFOLIN PO), Take 1 tablet by mouth daily., Disp: , Rfl:  .  levothyroxine (SYNTHROID, LEVOTHROID) 88 MCG tablet, Take 88 mcg by mouth daily before breakfast., Disp: , Rfl:  .  loratadine (CLARITIN) 10 MG tablet, Take 10 mg by mouth daily., Disp: , Rfl:  .  metFORMIN (GLUCOPHAGE) 500 MG tablet, Take 250 mg by mouth daily with breakfast., Disp: , Rfl:  .  Multiple Vitamins-Minerals (ICAPS MV) TABS, Take 1 tablet by mouth daily., Disp: , Rfl:  .  polyethylene glycol (MIRALAX / GLYCOLAX) packet, Take 17 g by mouth daily as needed for moderate constipation., Disp: , Rfl:  .  RABEprazole (ACIPHEX) 20 MG tablet, Take 40 mg by mouth daily. ,  Disp: , Rfl:  .  simvastatin (ZOCOR) 20 MG tablet, Take 20 mg by mouth at bedtime. , Disp: , Rfl:  .  vitamin B-12 (CYANOCOBALAMIN) 500 MCG tablet, Take 500 mcg by mouth daily., Disp: , Rfl:  .  acetaminophen (TYLENOL) 325 MG tablet, Take 2 tablets (650 mg total) by mouth every 6 (six) hours as needed (or Fever >/= 101). (Patient not taking: Reported on 06/27/2014), Disp: 60 tablet, Rfl: 0 .  bisacodyl (DULCOLAX) 10 MG suppository, Place 1 suppository (10 mg total) rectally daily as needed. (Patient not taking: Reported on 06/27/2014), Disp: 28 suppository, Rfl: 0 .  fluticasone (FLONASE) 50 MCG/ACT nasal spray, Place 2 sprays into both nostrils daily as needed for allergies or rhinitis. (Patient not taking: Reported on  06/07/2016), Disp: 16 g, Rfl: 2  Vitals:   06/07/16 1517  BP: 122/73  Pulse: 81  Resp: 16  Temp: 99 F (37.2 C)  TempSrc: Oral  SpO2: 96%  Weight: 142 lb 12.8 oz (64.8 kg)  Height: 5\' 4"  (1.626 m)    Body mass index is 24.51 kg/m.         Review of Systems As chest pain, dyspnea on exertion. Does not ambulate much. Does awaken during the night with numbness in her left hand occasionally which seems positional and improves after moving her hand around.    Objective:   Physical Exam BP 122/73 (BP Location: Left Arm, Patient Position: Sitting, Cuff Size: Normal)   Pulse 81   Temp 99 F (37.2 C) (Oral)   Resp 16   Ht 5\' 4"  (1.626 m)   Wt 142 lb 12.8 oz (64.8 kg)   SpO2 96%   BMI 24.51 kg/m     Gen.-alert and oriented x3 in no apparent distress-elderly  HEENT normal for age Lungs no rhonchi or wheezing Cardiovascular regular rhythm no murmurs carotid pulses 3+ palpable no bruits audible Abdomen soft nontender no palpable masses Musculoskeletal free of  major deformities Skin clear -no rashes Neurologic normal Lower extremities 3+ femoral and dorsalis pedis pulses palpable bilaterally with no edema on the right 1+ on the left Extensive bulging varicosities from the knee to the ankle left leg in the pretibial medial and lateral positions. Varicosities up to 2 centimeters in diameter. Eschar over the previous bleeding site on the lateral lower leg about 8 cm proximal to lateral malleolus. No active ulceration noted.  Today I performed a bedside SonoSite ultrasound exam which reveals a very large caliber left great saphenous vein with gross reflux throughout supplying these painful varicosities  I also obtained a formal venous duplex exam which revealed the same findings with gross reflux and a large caliber left great saphenous vein       Assessment:     Multiple varicosities left leg with acute onset of hemorrhage 2 days ago requiring EMS to  control the bleeding  in 83 year old patient who lives alone.-CEAP 3 this is due to gross reflux left great saphenous vein    Plan:     Patient needs laser ablation left great saphenous vein plus foam sclerotherapy of the bleeding site. Will notify insurance company to waive three-month waiting. Because of patient's age and risk of further spontaneous bleeding and she lives alone Hopefully can perform this in the near future

## 2016-06-08 ENCOUNTER — Other Ambulatory Visit: Payer: Self-pay | Admitting: *Deleted

## 2016-06-08 DIAGNOSIS — I83812 Varicose veins of left lower extremities with pain: Secondary | ICD-10-CM

## 2016-06-13 ENCOUNTER — Inpatient Hospital Stay (HOSPITAL_COMMUNITY)
Admission: EM | Admit: 2016-06-13 | Discharge: 2016-06-16 | DRG: 377 | Disposition: A | Discharge status: Home Health | Payer: Medicare Other | Attending: Internal Medicine | Admitting: Internal Medicine

## 2016-06-13 ENCOUNTER — Encounter (HOSPITAL_COMMUNITY): Payer: Self-pay

## 2016-06-13 ENCOUNTER — Emergency Department (HOSPITAL_COMMUNITY): Payer: Medicare Other

## 2016-06-13 DIAGNOSIS — R197 Diarrhea, unspecified: Secondary | ICD-10-CM | POA: Diagnosis not present

## 2016-06-13 DIAGNOSIS — D62 Acute posthemorrhagic anemia: Secondary | ICD-10-CM | POA: Diagnosis present

## 2016-06-13 DIAGNOSIS — K219 Gastro-esophageal reflux disease without esophagitis: Secondary | ICD-10-CM | POA: Diagnosis not present

## 2016-06-13 DIAGNOSIS — Z7984 Long term (current) use of oral hypoglycemic drugs: Secondary | ICD-10-CM | POA: Diagnosis not present

## 2016-06-13 DIAGNOSIS — R531 Weakness: Secondary | ICD-10-CM

## 2016-06-13 DIAGNOSIS — Z96651 Presence of right artificial knee joint: Secondary | ICD-10-CM | POA: Diagnosis present

## 2016-06-13 DIAGNOSIS — Z7982 Long term (current) use of aspirin: Secondary | ICD-10-CM | POA: Diagnosis not present

## 2016-06-13 DIAGNOSIS — E119 Type 2 diabetes mellitus without complications: Secondary | ICD-10-CM | POA: Diagnosis not present

## 2016-06-13 DIAGNOSIS — Z87891 Personal history of nicotine dependence: Secondary | ICD-10-CM | POA: Diagnosis not present

## 2016-06-13 DIAGNOSIS — R404 Transient alteration of awareness: Secondary | ICD-10-CM | POA: Diagnosis not present

## 2016-06-13 DIAGNOSIS — F419 Anxiety disorder, unspecified: Secondary | ICD-10-CM | POA: Diagnosis present

## 2016-06-13 DIAGNOSIS — G4733 Obstructive sleep apnea (adult) (pediatric): Secondary | ICD-10-CM | POA: Diagnosis present

## 2016-06-13 DIAGNOSIS — E039 Hypothyroidism, unspecified: Secondary | ICD-10-CM | POA: Diagnosis not present

## 2016-06-13 DIAGNOSIS — K5791 Diverticulosis of intestine, part unspecified, without perforation or abscess with bleeding: Principal | ICD-10-CM | POA: Diagnosis present

## 2016-06-13 DIAGNOSIS — J189 Pneumonia, unspecified organism: Secondary | ICD-10-CM | POA: Diagnosis not present

## 2016-06-13 DIAGNOSIS — J111 Influenza due to unidentified influenza virus with other respiratory manifestations: Secondary | ICD-10-CM | POA: Diagnosis not present

## 2016-06-13 DIAGNOSIS — Z888 Allergy status to other drugs, medicaments and biological substances status: Secondary | ICD-10-CM

## 2016-06-13 DIAGNOSIS — R05 Cough: Secondary | ICD-10-CM | POA: Diagnosis not present

## 2016-06-13 DIAGNOSIS — Z9119 Patient's noncompliance with other medical treatment and regimen: Secondary | ICD-10-CM

## 2016-06-13 DIAGNOSIS — H9190 Unspecified hearing loss, unspecified ear: Secondary | ICD-10-CM | POA: Diagnosis present

## 2016-06-13 DIAGNOSIS — K449 Diaphragmatic hernia without obstruction or gangrene: Secondary | ICD-10-CM | POA: Diagnosis present

## 2016-06-13 DIAGNOSIS — Z880 Allergy status to penicillin: Secondary | ICD-10-CM | POA: Diagnosis not present

## 2016-06-13 DIAGNOSIS — J45909 Unspecified asthma, uncomplicated: Secondary | ICD-10-CM

## 2016-06-13 DIAGNOSIS — F411 Generalized anxiety disorder: Secondary | ICD-10-CM | POA: Diagnosis present

## 2016-06-13 DIAGNOSIS — J9601 Acute respiratory failure with hypoxia: Secondary | ICD-10-CM

## 2016-06-13 DIAGNOSIS — K922 Gastrointestinal hemorrhage, unspecified: Secondary | ICD-10-CM | POA: Diagnosis not present

## 2016-06-13 DIAGNOSIS — K625 Hemorrhage of anus and rectum: Secondary | ICD-10-CM | POA: Diagnosis not present

## 2016-06-13 DIAGNOSIS — N289 Disorder of kidney and ureter, unspecified: Secondary | ICD-10-CM | POA: Diagnosis present

## 2016-06-13 DIAGNOSIS — J101 Influenza due to other identified influenza virus with other respiratory manifestations: Secondary | ICD-10-CM | POA: Diagnosis present

## 2016-06-13 DIAGNOSIS — G4730 Sleep apnea, unspecified: Secondary | ICD-10-CM | POA: Diagnosis not present

## 2016-06-13 DIAGNOSIS — Z79899 Other long term (current) drug therapy: Secondary | ICD-10-CM

## 2016-06-13 DIAGNOSIS — E785 Hyperlipidemia, unspecified: Secondary | ICD-10-CM | POA: Diagnosis present

## 2016-06-13 DIAGNOSIS — E876 Hypokalemia: Secondary | ICD-10-CM | POA: Diagnosis present

## 2016-06-13 DIAGNOSIS — I10 Essential (primary) hypertension: Secondary | ICD-10-CM | POA: Diagnosis not present

## 2016-06-13 DIAGNOSIS — I83899 Varicose veins of unspecified lower extremities with other complications: Secondary | ICD-10-CM | POA: Diagnosis present

## 2016-06-13 DIAGNOSIS — R0902 Hypoxemia: Secondary | ICD-10-CM

## 2016-06-13 DIAGNOSIS — Z882 Allergy status to sulfonamides status: Secondary | ICD-10-CM

## 2016-06-13 LAB — TYPE AND SCREEN
ABO/RH(D): A POS
Antibody Screen: NEGATIVE

## 2016-06-13 LAB — HEMOGLOBIN AND HEMATOCRIT, BLOOD
HEMATOCRIT: 26.1 % — AB (ref 36.0–46.0)
Hemoglobin: 8.9 g/dL — ABNORMAL LOW (ref 12.0–15.0)

## 2016-06-13 LAB — CBC
HCT: 31 % — ABNORMAL LOW (ref 36.0–46.0)
Hemoglobin: 10.2 g/dL — ABNORMAL LOW (ref 12.0–15.0)
MCH: 30.5 pg (ref 26.0–34.0)
MCHC: 32.9 g/dL (ref 30.0–36.0)
MCV: 92.8 fL (ref 78.0–100.0)
PLATELETS: 176 10*3/uL (ref 150–400)
RBC: 3.34 MIL/uL — AB (ref 3.87–5.11)
RDW: 16.1 % — AB (ref 11.5–15.5)
WBC: 4.4 10*3/uL (ref 4.0–10.5)

## 2016-06-13 LAB — URINALYSIS, ROUTINE W REFLEX MICROSCOPIC
Bilirubin Urine: NEGATIVE
Glucose, UA: NEGATIVE mg/dL
Hgb urine dipstick: NEGATIVE
KETONES UR: NEGATIVE mg/dL
LEUKOCYTES UA: NEGATIVE
NITRITE: NEGATIVE
PH: 5 (ref 5.0–8.0)
Protein, ur: NEGATIVE mg/dL
SPECIFIC GRAVITY, URINE: 1.013 (ref 1.005–1.030)

## 2016-06-13 LAB — COMPREHENSIVE METABOLIC PANEL
ALBUMIN: 3.8 g/dL (ref 3.5–5.0)
ALT: 17 U/L (ref 14–54)
AST: 27 U/L (ref 15–41)
Alkaline Phosphatase: 80 U/L (ref 38–126)
Anion gap: 10 (ref 5–15)
BUN: 33 mg/dL — AB (ref 6–20)
CHLORIDE: 99 mmol/L — AB (ref 101–111)
CO2: 26 mmol/L (ref 22–32)
Calcium: 8.5 mg/dL — ABNORMAL LOW (ref 8.9–10.3)
Creatinine, Ser: 1.06 mg/dL — ABNORMAL HIGH (ref 0.44–1.00)
GFR calc Af Amer: 51 mL/min — ABNORMAL LOW (ref >60)
GFR calc non Af Amer: 44 mL/min — ABNORMAL LOW (ref >60)
GLUCOSE: 93 mg/dL (ref 65–99)
POTASSIUM: 3.3 mmol/L — AB (ref 3.5–5.1)
SODIUM: 135 mmol/L (ref 135–145)
Total Bilirubin: 0.4 mg/dL (ref 0.3–1.2)
Total Protein: 6.2 g/dL — ABNORMAL LOW (ref 6.5–8.1)

## 2016-06-13 LAB — I-STAT CG4 LACTIC ACID, ED: Lactic Acid, Venous: 0.85 mmol/L (ref 0.5–1.9)

## 2016-06-13 LAB — ABO/RH: ABO/RH(D): A POS

## 2016-06-13 LAB — POC OCCULT BLOOD, ED: Fecal Occult Bld: NEGATIVE

## 2016-06-13 LAB — INFLUENZA PANEL BY PCR (TYPE A & B)
INFLAPCR: POSITIVE — AB
INFLBPCR: NEGATIVE

## 2016-06-13 LAB — PROTIME-INR
INR: 0.95
PROTHROMBIN TIME: 12.7 s (ref 11.4–15.2)

## 2016-06-13 MED ORDER — OSELTAMIVIR PHOSPHATE 30 MG PO CAPS
30.0000 mg | ORAL_CAPSULE | Freq: Two times a day (BID) | ORAL | Status: DC
Start: 2016-06-13 — End: 2016-06-16
  Administered 2016-06-13 – 2016-06-16 (×6): 30 mg via ORAL
  Filled 2016-06-13 (×7): qty 1

## 2016-06-13 MED ORDER — INSULIN ASPART 100 UNIT/ML ~~LOC~~ SOLN
0.0000 [IU] | Freq: Three times a day (TID) | SUBCUTANEOUS | Status: DC
  Administered 2016-06-16: 1 [IU] via SUBCUTANEOUS

## 2016-06-13 MED ORDER — SODIUM CHLORIDE 0.9 % IV BOLUS (SEPSIS)
500.0000 mL | Freq: Once | INTRAVENOUS | Status: AC
  Administered 2016-06-13: 500 mL via INTRAVENOUS

## 2016-06-13 MED ORDER — ACETAMINOPHEN 325 MG PO TABS: 325.0000 mg | ORAL_TABLET | Freq: Four times a day (QID) | ORAL | Status: DC | PRN

## 2016-06-13 MED ORDER — POTASSIUM CHLORIDE CRYS ER 20 MEQ PO TBCR
40.0000 meq | EXTENDED_RELEASE_TABLET | Freq: Once | ORAL | Status: AC
  Administered 2016-06-13: 40 meq via ORAL
  Filled 2016-06-13: qty 2

## 2016-06-13 MED ORDER — DEXTROSE 5 % IV SOLN
500.0000 mg | Freq: Once | INTRAVENOUS | Status: AC
  Administered 2016-06-13: 500 mg via INTRAVENOUS
  Filled 2016-06-13: qty 500

## 2016-06-13 MED ORDER — ALPRAZOLAM 0.25 MG PO TABS
0.2500 mg | ORAL_TABLET | Freq: Every day | ORAL | Status: DC
  Administered 2016-06-13 – 2016-06-15 (×3): 0.25 mg via ORAL
  Filled 2016-06-13 (×3): qty 1

## 2016-06-13 MED ORDER — ESCITALOPRAM OXALATE 20 MG PO TABS
20.0000 mg | ORAL_TABLET | Freq: Every day | ORAL | Status: DC
Start: 2016-06-13 — End: 2016-06-16
  Administered 2016-06-13 – 2016-06-16 (×4): 20 mg via ORAL
  Filled 2016-06-13 (×4): qty 1

## 2016-06-13 MED ORDER — ONDANSETRON HCL 4 MG/2ML IJ SOLN: 4.0000 mg | Freq: Four times a day (QID) | INTRAMUSCULAR | Status: DC | PRN

## 2016-06-13 MED ORDER — KCL IN DEXTROSE-NACL 20-5-0.45 MEQ/L-%-% IV SOLN
INTRAVENOUS | Status: DC
  Administered 2016-06-13 – 2016-06-15 (×3): via INTRAVENOUS
  Filled 2016-06-13 (×5): qty 1000

## 2016-06-13 MED ORDER — SIMVASTATIN 10 MG PO TABS
20.0000 mg | ORAL_TABLET | Freq: Every day | ORAL | Status: DC
  Administered 2016-06-13 – 2016-06-15 (×3): 20 mg via ORAL
  Filled 2016-06-13 (×2): qty 2

## 2016-06-13 MED ORDER — ALBUTEROL SULFATE (2.5 MG/3ML) 0.083% IN NEBU: 2.5000 mg | INHALATION_SOLUTION | RESPIRATORY_TRACT | Status: DC | PRN

## 2016-06-13 MED ORDER — IPRATROPIUM-ALBUTEROL 0.5-2.5 (3) MG/3ML IN SOLN
3.0000 mL | Freq: Three times a day (TID) | RESPIRATORY_TRACT | Status: DC
  Administered 2016-06-13 – 2016-06-16 (×8): 3 mL via RESPIRATORY_TRACT
  Filled 2016-06-13 (×7): qty 3

## 2016-06-13 MED ORDER — PANTOPRAZOLE SODIUM 40 MG PO TBEC
40.0000 mg | DELAYED_RELEASE_TABLET | Freq: Two times a day (BID) | ORAL | Status: DC
  Administered 2016-06-14 – 2016-06-16 (×5): 40 mg via ORAL
  Filled 2016-06-13 (×5): qty 1

## 2016-06-13 MED ORDER — POLYETHYLENE GLYCOL 3350 17 G PO PACK: 17.0000 g | PACK | Freq: Every day | ORAL | Status: DC | PRN

## 2016-06-13 MED ORDER — LEVOTHYROXINE SODIUM 100 MCG PO TABS
100.0000 ug | ORAL_TABLET | Freq: Every day | ORAL | Status: DC
  Administered 2016-06-14 – 2016-06-16 (×3): 100 ug via ORAL
  Filled 2016-06-13 (×3): qty 1

## 2016-06-13 MED ORDER — ONDANSETRON HCL 4 MG PO TABS: 4.0000 mg | ORAL_TABLET | Freq: Four times a day (QID) | ORAL | Status: DC | PRN

## 2016-06-13 MED ORDER — DEXTROSE 5 % IV SOLN
1.0000 g | Freq: Once | INTRAVENOUS | Status: AC
  Administered 2016-06-13: 1 g via INTRAVENOUS
  Filled 2016-06-13: qty 10

## 2016-06-13 NOTE — ED Provider Notes (Signed)
[facility] DEPT Provider Note   CSN: [id number] Arrival date & time: 06/13/16  1326     History   Chief Complaint Chief Complaint  Patient presents with  . Weakness  . Diarrhea    HPI Cristina Middleton is a 83 y.o. female.  Patient c/o feeling generally weak for the past 2-3 days. No focal or unilateral numbness/weakness. States diarrhea for past 2 days, but only single bm today, loose - states when went to bathroom thinks there was blood in stool, bright red. Denies melena. No vomiting. No abd pain. Patient denies dysuria or hematuria. Temp at home 100, +chills. +non productive cough. Denies sore throat, nasal congestion or other uri c/o. Denies sob. No chest pain.    The history is provided by the patient, a relative and the EMS personnel.  Weakness  Pertinent negatives include no shortness of breath, no chest pain, no vomiting, no confusion and no headaches.  Diarrhea   Associated symptoms include chills and cough. Pertinent negatives include no abdominal pain, no vomiting and no headaches.    Past Medical History:  Diagnosis Date  . Anxiety   . Arthritis   . Diabetes mellitus without complication ([place])   . Dislocated shoulder    WAS POPPED BACK IN PLACE  2006???  . GERD (gastroesophageal reflux disease)    TAKES  ACIPHEX  . Hypertension   . Hyperthyroidism    NOT SURE WHICH ONE  . PONV (postoperative nausea and vomiting)    YRS AGO.....(6-7 YRS)  . Sleep apnea    WEARS MASK EVERY NOW AND THEN    Patient Active Problem List   Diagnosis Date Noted  . Varicose veins of left lower extremity with complications [id number]  . SOB (shortness of breath) 06/28/2014  . Abdominal pain 06/28/2014  . Encounter for nasogastric (NG) tube placement   . SBO (small bowel obstruction) 06/27/2014  . Diabetes mellitus without complication ([place]) [id number]  . Acute blood loss anemia 05/26/2012  . HTN (hypertension) 05/25/2012  . Diabetes mellitus, type 2 ([place]) 05/25/2012  .  Osteoarthritis of right knee 05/25/2012  . Sleep apnea 05/25/2012  . GERD (gastroesophageal reflux disease) 05/25/2012  . Hypothyroidism 05/25/2012    Past Surgical History:  Procedure Laterality Date  . BREAST SURGERY     CYST REMOVED -- LEFT  . EYE SURGERY     BILATERAL  . LIPOMA EXCISION     FROM LEFT BACK  . TONSILLECTOMY    . TOTAL KNEE ARTHROPLASTY  05/25/2012   right knee  . TOTAL KNEE ARTHROPLASTY  05/25/2012   Procedure: TOTAL KNEE ARTHROPLASTY;  Surgeon: Yvette Rack., MD;  Location: [facility];  Service: Orthopedics;  Laterality: Right;    OB History    No data available       Home Medications    Prior to Admission medications   Medication Sig Start Date End Date Taking? Authorizing Provider  acetaminophen (TYLENOL) 325 MG tablet Take 2 tablets (650 mg total) by mouth every 6 (six) hours as needed (or Fever >/= 101). Patient not taking: Reported on 06/27/2014 05/28/12   Chriss Czar, PA-C  acetaminophen (TYLENOL) 325 MG tablet Take 650 mg by mouth at bedtime.    Historical Provider, MD  ALPRAZolam Duanne Moron) 0.25 MG tablet Take 0.25 mg by mouth every morning.    Historical Provider, MD  aspirin EC 81 MG tablet Take 81 mg by mouth daily.    Historical Provider, MD  bisacodyl (DULCOLAX) 10 MG suppository Place 1  suppository (10 mg total) rectally daily as needed. Patient not taking: Reported on 06/27/2014 05/28/12   Chriss Czar, PA-C  Calcium Carb-Cholecalciferol (CALCIUM 600 + D PO) Take 1 tablet by mouth daily.    Historical Provider, MD  CINNAMON PO Take 1,000 mg by mouth daily.    Historical Provider, MD  escitalopram (LEXAPRO) 20 MG tablet Take 20 mg by mouth daily.    Historical Provider, MD  fluticasone (FLONASE) 50 MCG/ACT nasal spray Place 2 sprays into both nostrils daily as needed for allergies or rhinitis. Patient not taking: Reported on 06/07/2016 07/02/14   Ripudeep Krystal Eaton, MD  guaiFENesin-dextromethorphan (ROBITUSSIN DM) 100-10 MG/5ML syrup Take 5 mLs by  mouth every 4 (four) hours as needed for cough. 07/02/14   Ripudeep Krystal Eaton, MD  indapamide (LOZOL) 2.5 MG tablet Take 2.5 mg by mouth 3 (three) times a week. 3 days a week (Sunday, Wednesday, Friday)    Historical Provider, MD  L-Methylfolate-B12-B6-B2 (CEREFOLIN PO) Take 1 tablet by mouth daily.    Historical Provider, MD  levothyroxine (SYNTHROID, LEVOTHROID) 88 MCG tablet Take 88 mcg by mouth daily before breakfast.    Historical Provider, MD  loratadine (CLARITIN) 10 MG tablet Take 10 mg by mouth daily.    Historical Provider, MD  metFORMIN (GLUCOPHAGE) 500 MG tablet Take 250 mg by mouth daily with breakfast.    Historical Provider, MD  Multiple Vitamins-Minerals (ICAPS MV) TABS Take 1 tablet by mouth daily.    Historical Provider, MD  polyethylene glycol (MIRALAX / GLYCOLAX) packet Take 17 g by mouth daily as needed for moderate constipation.    Historical Provider, MD  RABEprazole (ACIPHEX) 20 MG tablet Take 40 mg by mouth daily.     Historical Provider, MD  simvastatin (ZOCOR) 20 MG tablet Take 20 mg by mouth at bedtime.     Historical Provider, MD  vitamin B-12 (CYANOCOBALAMIN) 500 MCG tablet Take 500 mcg by mouth daily.    Historical Provider, MD    Family History Family History  Problem Relation Age of Onset  . Other Mother   . Other Father     Social History Social History  Substance Use Topics  . Smoking status: Never Smoker  . Smokeless tobacco: Never Used  . Alcohol use No     Allergies   Other; Penicillins; and Sulfa antibiotics   Review of Systems Review of Systems  Constitutional: Positive for chills and fever.  HENT: Negative for sore throat.   Eyes: Negative for redness.  Respiratory: Positive for cough. Negative for shortness of breath.   Cardiovascular: Negative for chest pain.  Gastrointestinal: Positive for diarrhea. Negative for abdominal pain and vomiting.  Genitourinary: Negative for dysuria and flank pain.  Musculoskeletal: Negative for back pain and  neck pain.  Skin: Negative for rash.  Neurological: Positive for weakness. Negative for headaches.  Hematological: Does not bruise/bleed easily.  Psychiatric/Behavioral: Negative for confusion.     Physical Exam Updated Vital Signs BP 116/57   Pulse 84   Temp 99.3 F (37.4 C) (Oral)   Resp 22   Ht 5\' 4"  (1.626 m)   Wt 64.4 kg   SpO2 97%   BMI 24.37 kg/m   Physical Exam  Constitutional: She is oriented to person, place, and time. She appears well-developed and well-nourished. No distress.  HENT:  Mouth/Throat: Oropharynx is clear and moist.  Eyes: Conjunctivae are normal. No scleral icterus.  Neck: Neck supple. No tracheal deviation present.  Cardiovascular: Normal rate, regular rhythm, normal heart  sounds and intact distal pulses.  Exam reveals no gallop and no friction rub.   No murmur heard. Pulmonary/Chest: Effort normal. No respiratory distress.  Rhonchi bilat.   Abdominal: Soft. Normal appearance and bowel sounds are normal. She exhibits no distension. There is no tenderness.  Genitourinary:  Genitourinary Comments: Medium brown stool, no gross blood.   Musculoskeletal: She exhibits no edema.  Neurological: She is alert and oriented to person, place, and time.  Speech clear fluent. Moves bil extremities purposefully w good strength.  Skin: Skin is warm and dry. No rash noted. She is not diaphoretic.  Psychiatric: She has a normal mood and affect.  Nursing note and vitals reviewed.    ED Treatments / Results  Labs (all labs ordered are listed, but only abnormal results are displayed) Results for orders placed or performed during the hospital encounter of 06/13/16  CBC  Result Value Ref Range   WBC 4.4 4.0 - 10.5 K/uL   RBC 3.34 (L) 3.87 - 5.11 MIL/uL   Hemoglobin 10.2 (L) 12.0 - 15.0 g/dL   HCT 31.0 (L) 36.0 - 46.0 %   MCV 92.8 78.0 - 100.0 fL   MCH 30.5 26.0 - 34.0 pg   MCHC 32.9 30.0 - 36.0 g/dL   RDW 16.1 (H) 11.5 - 15.5 %   Platelets 176 150 - 400 K/uL   Comprehensive metabolic panel  Result Value Ref Range   Sodium 135 135 - 145 mmol/L   Potassium 3.3 (L) 3.5 - 5.1 mmol/L   Chloride 99 (L) 101 - 111 mmol/L   CO2 26 22 - 32 mmol/L   Glucose, Bld 93 65 - 99 mg/dL   BUN 33 (H) 6 - 20 mg/dL   Creatinine, Ser 1.06 (H) 0.44 - 1.00 mg/dL   Calcium 8.5 (L) 8.9 - 10.3 mg/dL   Total Protein 6.2 (L) 6.5 - 8.1 g/dL   Albumin 3.8 3.5 - 5.0 g/dL   AST 27 15 - 41 U/L   ALT 17 14 - 54 U/L   Alkaline Phosphatase 80 38 - 126 U/L   Total Bilirubin 0.4 0.3 - 1.2 mg/dL   GFR calc non Af Amer 44 (L) >60 mL/min   GFR calc Af Amer 51 (L) >60 mL/min   Anion gap 10 5 - 15  Protime-INR  Result Value Ref Range   Prothrombin Time 12.7 11.4 - 15.2 seconds   INR 0.95   POC occult blood, ED Provider will collect  Result Value Ref Range   Fecal Occult Bld NEGATIVE NEGATIVE   Dg Chest 2 View  Result Date: 06/13/2016 CLINICAL DATA:  83 year old female with a history of cough for 1 week EXAM: CHEST  2 VIEW COMPARISON:  07/01/2014, 06/30/2014 FINDINGS: Cardiomediastinal silhouette unchanged in size and contour. Calcifications of the aortic arch. Double density overlying the lower mediastinum again noted. Low lung volumes with patchy opacities at the right greater than left lung base. Lateral view demonstrates no large pleural effusion. No pneumothorax. Accentuated kyphotic curvature of the thoracic spine with multilevel degenerative changes. Unchanged configuration of vertebral bodies with no displaced fracture. Compare to the prior there has been removal of the gastric tube. IMPRESSION: New patchy opacities at the right greater than left lung bases concerning for infection given the history. Re- demonstration of large hiatal hernia. Aortic atherosclerosis. Signed, Dulcy Fanny. Earleen Newport, DO [facility] Electronically Signed   By: Corrie Mckusick D.O.   On: 06/13/2016 15:54    EKG  EKG Interpretation None        Radiology Dg Chest 2 View  Result Date: 06/13/2016 CLINICAL DATA:  83 year old female with a history of cough for 1 week EXAM: CHEST  2 VIEW COMPARISON:  07/01/2014, 06/30/2014 FINDINGS: Cardiomediastinal silhouette unchanged in size and contour. Calcifications of the aortic arch. Double density overlying the lower mediastinum again noted. Low lung volumes with patchy opacities at the right greater than left lung base. Lateral view demonstrates no large pleural effusion. No pneumothorax. Accentuated kyphotic curvature of the thoracic spine with multilevel degenerative changes. Unchanged configuration of vertebral bodies with no displaced fracture. Compare to the prior there has been removal of the gastric tube. IMPRESSION: New patchy opacities at the right greater than left lung bases concerning for infection given the history. Re- demonstration of large hiatal hernia. Aortic atherosclerosis. Signed, Dulcy Fanny. Earleen Newport, DO [facility] Rad[facility] Electronically Signed   By: Corrie Mckusick D.O.   On: 06/13/2016 15:54    Procedures Procedures (including critical care time)  Medications Ordered in ED Medications  sodium chloride 0.9 % bolus 500 mL (not administered)     Initial Impression / Assessment and Plan / ED Course  I have reviewed the triage vital signs and the nursing notes.  Pertinent labs & imaging results that were available during my care of the patient were reviewed by me and considered in my medical decision making (see chart for details).  Iv ns. Labs. Cxr.  Reviewed nursing notes and prior charts for additional history.   Opacities/infiltrate on cxr.  Will culture, add lactate to labs, and rx iv abx.  Given low room air pulses ox, mid to upper 80s, will admit.  As admit, resp symptoms, will add flu panel to labs.   Medicine service consulted for admission.    Final Clinical Impressions(s) / ED Diagnoses   Final  diagnoses:  None    New Prescriptions New Prescriptions   No medications on file     Lajean Saver, MD 06/13/16 1606

## 2016-06-13 NOTE — ED Notes (Signed)
Attempted to call report x 2. RN unavailable.  

## 2016-06-13 NOTE — ED Notes (Signed)
Pt cannot use restroom at this time, aware urine specimen is needed.  

## 2016-06-13 NOTE — H&P (Addendum)
History and Physical    Cristina Middleton [id number] DOB: [birth year] DOA: 06/13/2016  Referring MD/NP/PA: Ashok Cordia PCP: Jani Gravel, MD  Outpatient Specialists: none Patient coming from: home alone  Chief Complaint: fatigue, weakness, and BRBPR  HPI: Cristina Middleton is a 83 y.o. female with history of diabetes mellitus type 2, hypertension, hyperlipidemia, hyperthyroidism, diverticulosis, OSA but noncompliant with CPAP, anxiety who presents with fatigue, cough, and blood stool.  She states she was feeling well until 5-6 days ago when she developed a "chesty" cough.  A day or two after she developed her cough she developed watery diarrhea which subsequently resolved and she had a formed brown stool the day prior to admission.  She has felt a little SOB and had subjective chills and worsening fatigue despite taking mucinex.  This morning, she felt she was going to have diarrhea, but she was unable to make it to the bathroom when she had a large bloody bowel movement on the floor.  She was transported via EMS to the ER  ED Course:  BP and pulse were stable in the ER and she was afebrile, however, she was 85-88% on RA and was placed on 2L.  CXR demonstrated bilateral opacities.  WBC is normal. She was given ceftriaxone and azithromycin initially for CAP, however, her flu PCR became positive and she was started on tamiflu.  Her rectal exam by the ER MD demonstrated bright red blood.    Review of Systems:  General:  Subjective fevers, chills, weight loss or gain HEENT:  Denies changes to hearing and vision, rhinorrhea, sinus congestion, sore throat CV:  Denies chest pain and palpitations, lower extremity edema.  Having pain behind her right shoulder blade PULM:  Positive SOB, wheezing, cough.   GI:  Denies nausea, vomiting.  Stools as per HPI   GU:  Denies dysuria, frequency, urgency ENDO:  Denies polyuria, polydipsia.   HEME:  Denies hematemesis, abnormal bruising or bleeding.  LYMPH:  Denies  lymphadenopathy.   MSK:  Denies arthralgias, myalgias.   DERM:  Denies skin rash or ulcer.   NEURO:  Denies focal numbness, weakness, slurred speech, confusion, facial droop.  PSYCH:  Denies anxiety and depression.    Past Medical History:  Diagnosis Date  . Anxiety   . Arthritis   . Diabetes mellitus without complication ([place])   . Dislocated shoulder    WAS POPPED BACK IN PLACE  2006???  . GERD (gastroesophageal reflux disease)    TAKES  ACIPHEX  . Hypertension   . Hyperthyroidism    NOT SURE WHICH ONE  . PONV (postoperative nausea and vomiting)    YRS AGO.....(6-7 YRS)  . Sleep apnea    WEARS MASK EVERY NOW AND THEN    Past Surgical History:  Procedure Laterality Date  . BREAST SURGERY     CYST REMOVED -- LEFT  . EYE SURGERY     BILATERAL  . LIPOMA EXCISION     FROM LEFT BACK  . TONSILLECTOMY    . TOTAL KNEE ARTHROPLASTY  05/25/2012   right knee  . TOTAL KNEE ARTHROPLASTY  05/25/2012   Procedure: TOTAL KNEE ARTHROPLASTY;  Surgeon: Yvette Rack., MD;  Location: [facility];  Service: Orthopedics;  Laterality: Right;     reports that she quit smoking about 18 years ago. Her smoking use included Cigarettes. She smoked 1.00 pack per day. She has never used smokeless tobacco. She reports that she does not drink alcohol or use drugs.  Allergies  Allergen  Reactions  . Other Other (See Comments)    Pt is allergic to Alka-Seltzer tablets.   Reaction:  Hallucinations   . Penicillins Hives, Itching, Swelling and Other (See Comments)    Reaction:  Unspecified swelling reaction Has patient had a PCN reaction causing immediate rash, facial/tongue/throat swelling, SOB or lightheadedness with hypotension: Yes Has patient had a PCN reaction causing severe rash involving mucus membranes or skin necrosis: No Has patient had a PCN reaction that required hospitalization No Has patient had a PCN reaction occurring within the last 10 years: No If all of the above answers are "NO", then may  proceed with Cephalosporin use.  . Sulfa Antibiotics Hives, Itching, Swelling and Other (See Comments)    Reaction:  Unspecified swelling reaction    Family History  Problem Relation Age of Onset  . Other Mother   . Other Father   . Breast cancer Sister   . Colon cancer Sister   . CAD Sister   . Marfan syndrome Sister   . CAD Brother   . Marfan syndrome Brother     Prior to Admission medications   Medication Sig Start Date End Date Taking? Authorizing Provider  acetaminophen (TYLENOL) 325 MG tablet Take 325 mg by mouth every 6 (six) hours as needed for mild pain, moderate pain, fever or headache.    Yes Historical Provider, MD  ALPRAZolam (XANAX) 0.25 MG tablet Take 0.25 mg by mouth at bedtime.    Yes Historical Provider, MD  aspirin EC 81 MG tablet Take 81 mg by mouth at bedtime.    Yes Historical Provider, MD  calcium carbonate (OSCAL) 1500 (600 Ca) MG TABS tablet Take 600 mg of elemental calcium by mouth daily.   Yes Historical Provider, MD  CINNAMON PO Take 1 tablet by mouth daily.    Yes Historical Provider, MD  diphenhydramine-acetaminophen (TYLENOL PM) 25-500 MG TABS tablet Take 1 tablet by mouth at bedtime as needed (for sleep/pain).   Yes Historical Provider, MD  escitalopram (LEXAPRO) 20 MG tablet Take 20 mg by mouth daily.   Yes Historical Provider, MD  indapamide (LOZOL) 2.5 MG tablet Take 2.5 mg by mouth 3 (three) times a week. Pt takes on Sunday, Wednesday, and Friday.   Yes Historical Provider, MD  L-Methylfolate-B12-B6-B2 (CEREFOLIN PO) Take 1 tablet by mouth daily.   Yes Historical Provider, MD  levothyroxine (SYNTHROID, LEVOTHROID) 100 MCG tablet Take 100 mcg by mouth daily before breakfast.   Yes Historical Provider, MD  loratadine (CLARITIN) 10 MG tablet Take 10 mg by mouth daily as needed for allergies.    Yes Historical Provider, MD  metFORMIN (GLUCOPHAGE) 500 MG tablet Take 250 mg by mouth daily after breakfast.    Yes Historical Provider, MD  Multiple  Vitamins-Minerals (PRESERVISION AREDS 2) CAPS Take 1 capsule by mouth daily.   Yes Historical Provider, MD  polyethylene glycol (MIRALAX / GLYCOLAX) packet Take 17 g by mouth daily as needed for moderate constipation.   Yes Historical Provider, MD  RABEprazole (ACIPHEX) 20 MG tablet Take 20 mg by mouth 2 (two) times daily.    Yes Historical Provider, MD  simvastatin (ZOCOR) 20 MG tablet Take 20 mg by mouth at bedtime.    Yes Historical Provider, MD  vitamin B-12 (CYANOCOBALAMIN) 500 MCG tablet Take 500 mcg by mouth daily.   Yes Historical Provider, MD    Physical Exam: Vitals:   06/13/16 1806 06/13/16 1942 06/13/16 1946 06/13/16 2023  BP: 118/60  93/69 (!) 131/58  Pulse: 80  73  Resp: 18   18  Temp:   98.2 F (36.8 C) 98.7 F (37.1 C)  TempSrc:    Oral  SpO2: 99% 94%  93%  Weight:      Height:       Constitutional: NAD, calm, comfortable Eyes: PERRL, lids and conjunctivae normal.  Cataract replacement ENMT: Mucous membranes are moist. Posterior pharynx clear of any exudate or lesions. dentures Neck: normal, supple, no masses, no thyromegaly Respiratory: clear to auscultation bilaterally, no wheezing, no crackles. Normal respiratory effort. No accessory muscle use.  Cardiovascular: Regular rate and rhythm, no murmurs / rubs / gallops. No extremity edema. 2+ pedal pulses. No carotid bruits.  Abdomen: no tenderness, no masses palpated. No hepatosplenomegaly. Bowel sounds positive.  Musculoskeletal: no clubbing / cyanosis. No joint deformity upper and lower extremities. Good ROM, no contractures. Normal muscle tone.  Skin: no rashes.  Has a bandage placed over the left lateral ankle covering a pinhead sized eschar on a varicose vein that was bleeding earlier today. No induration Neurologic: CN 2-12 grossly intact. Sensation intact, DTR normal. Strength 5/5 in all 4.  Psychiatric: Normal judgment and insight. Alert and oriented x 3. Normal mood.   Labs on Admission: I have personally  reviewed following labs and imaging studies  CBC:  Recent Labs Lab 06/13/16 1428  WBC 4.4  HGB 10.2*  HCT 31.0*  MCV 92.8  PLT 0000000   Basic Metabolic Panel:  Recent Labs Lab 06/13/16 1428  NA 135  K 3.3*  CL 99*  CO2 26  GLUCOSE 93  BUN 33*  CREATININE 1.06*  CALCIUM 8.5*   GFR: Estimated Creatinine Clearance: 28.6 mL/min (by C-G formula based on SCr of 1.06 mg/dL (H)). Liver Function Tests:  Recent Labs Lab 06/13/16 1428  AST 27  ALT 17  ALKPHOS 80  BILITOT 0.4  PROT 6.2*  ALBUMIN 3.8   No results for input(s): LIPASE, AMYLASE in the last 168 hours. No results for input(s): AMMONIA in the last 168 hours. Coagulation Profile:  Recent Labs Lab 06/13/16 1428  INR 0.95   Cardiac Enzymes: No results for input(s): CKTOTAL, CKMB, CKMBINDEX, TROPONINI in the last 168 hours. BNP (last 3 results) No results for input(s): PROBNP in the last 8760 hours. HbA1C: No results for input(s): HGBA1C in the last 72 hours. CBG: No results for input(s): GLUCAP in the last 168 hours. Lipid Profile: No results for input(s): CHOL, HDL, LDLCALC, TRIG, CHOLHDL, LDLDIRECT in the last 72 hours. Thyroid Function Tests: No results for input(s): TSH, T4TOTAL, FREET4, T3FREE, THYROIDAB in the last 72 hours. Anemia Panel: No results for input(s): VITAMINB12, FOLATE, FERRITIN, TIBC, IRON, RETICCTPCT in the last 72 hours. Urine analysis:    Component Value Date/Time   COLORURINE YELLOW 06/13/2016 1813   APPEARANCEUR CLEAR 06/13/2016 1813   LABSPEC 1.013 06/13/2016 1813   PHURINE 5.0 06/13/2016 1813   GLUCOSEU NEGATIVE 06/13/2016 1813   HGBUR NEGATIVE 06/13/2016 1813   BILIRUBINUR NEGATIVE 06/13/2016 1813   KETONESUR NEGATIVE 06/13/2016 1813   PROTEINUR NEGATIVE 06/13/2016 1813   UROBILINOGEN 0.2 06/28/2014 0456   NITRITE NEGATIVE 06/13/2016 1813   LEUKOCYTESUR NEGATIVE 06/13/2016 1813   Sepsis Labs: @LABRCNTIP (procalcitonin:4,lacticidven:4) )No results found for this or  any previous visit (from the past 240 hour(s)).   Radiological Exams on Admission: Dg Chest 2 View  Result Date: 06/13/2016 CLINICAL DATA:  83 year old female with a history of cough for 1 week EXAM: CHEST  2 VIEW COMPARISON:  07/01/2014, 06/30/2014 FINDINGS: Cardiomediastinal silhouette  unchanged in size and contour. Calcifications of the aortic arch. Double density overlying the lower mediastinum again noted. Low lung volumes with patchy opacities at the right greater than left lung base. Lateral view demonstrates no large pleural effusion. No pneumothorax. Accentuated kyphotic curvature of the thoracic spine with multilevel degenerative changes. Unchanged configuration of vertebral bodies with no displaced fracture. Compare to the prior there has been removal of the gastric tube. IMPRESSION: New patchy opacities at the right greater than left lung bases concerning for infection given the history. Re- demonstration of large hiatal hernia. Aortic atherosclerosis. Signed, Dulcy Fanny. Earleen Newport, DO [facility] Radiology Electronically Signed   By: Corrie Mckusick D.O.   On: 06/13/2016 15:54    EKG: independently reviewed.  Normal sinus rhythm, no ST segment elevations  Assessment/Plan Principal Problem:   Influenza with respiratory manifestation Active Problems:   Diabetes mellitus, type 2 (HCC)   Sleep apnea   GERD (gastroesophageal reflux disease)   Hypothyroidism   Acute blood loss anemia   GIB (gastrointestinal bleeding)   Bright red blood per rectum   Acute respiratory failure with hypoxia (HCC)   Reactive airway disease    Acute hypoxic respiratory failure due to influenza.  Wheezing on exam -  Tamiflu -  Trial of duonebs -  Sputum culture -  Blood cultures  -  Discontinue antibiotics -  Wean oxygen as tolerated  Acute blood loss anemia due to BRBPR, likely a diverticular hemorrhage.  Pt of Dr. Watt Climes, last colonoscopy > 10 years ago due  to age and demonstrated diverticula, internal and external hemorrhoids and three polyps which were biopsied -  Hold anticoagulation, NSAIDS -  H&H q6h -  Type and screen -  Place second PIV -  Transfuse to keep hgb > 7 -  IVF  Generalized anxiety disorder -  Continue Xanax and Lexapro  Hypothyroidism, stable, continue levothyroxine  Diabetes mellitus type 2 -  Hold metformin -  Start low-dose sliding scale insulin  Hyperlipidemia, stable, continue simvastatin  OSA, noncompliant with CPAP and unlikely to tolerate due to cough  GERD, stable, continue PPI  Hypokalemia -  Oral and IV potassium replacement  DVT prophylaxis: SCDs  Code Status: DNR Family Communication: pateint and her daughter who was present at bedside  Disposition Plan:  Likely to home in a few days  Consults called: none  Admission status: inpatient due to combination of hypoxia due to flu with wheezing and acute GIB in a 83 yo > high risk of decompensation.     Janece Canterbury MD [facility] Pager [phone]  If 7PM-7AM, please contact night-coverage [email or web address] Password [facility]  06/13/2016, 8:41 PM

## 2016-06-13 NOTE — ED Triage Notes (Signed)
Per [facility] pt from home reports diarrhea x 3 days , gl weakness today and chills. Took tylenol prior to arrival. Alert and oriented x 4. Ambulatory at home . Lives with daughter. denies abd pain nor shortness of breath,.

## 2016-06-13 NOTE — ED Notes (Signed)
Bed: [id number] Expected date:  Expected time:  Means of arrival:  Comments: EMS 90's

## 2016-06-14 LAB — BASIC METABOLIC PANEL
Anion gap: 5 (ref 5–15)
BUN: 19 mg/dL (ref 6–20)
CALCIUM: 7.7 mg/dL — AB (ref 8.9–10.3)
CHLORIDE: 104 mmol/L (ref 101–111)
CO2: 26 mmol/L (ref 22–32)
CREATININE: 0.79 mg/dL (ref 0.44–1.00)
GFR calc non Af Amer: >60 mL/min (ref >60)
GLUCOSE: 113 mg/dL — AB (ref 65–99)
Potassium: 3.5 mmol/L (ref 3.5–5.1)
Sodium: 135 mmol/L (ref 135–145)

## 2016-06-14 LAB — HEMOGLOBIN AND HEMATOCRIT, BLOOD
HCT: 24.2 % — ABNORMAL LOW (ref 36.0–46.0)
HEMATOCRIT: 25.9 % — AB (ref 36.0–46.0)
HEMATOCRIT: 24 % — AB (ref 36.0–46.0)
HEMOGLOBIN: 8.1 g/dL — AB (ref 12.0–15.0)
Hemoglobin: 8.7 g/dL — ABNORMAL LOW (ref 12.0–15.0)
Hemoglobin: 8.2 g/dL — ABNORMAL LOW (ref 12.0–15.0)

## 2016-06-14 LAB — GLUCOSE, CAPILLARY
GLUCOSE-CAPILLARY: 105 mg/dL — AB (ref 65–99)
GLUCOSE-CAPILLARY: 140 mg/dL — AB (ref 65–99)
Glucose-Capillary: 120 mg/dL — ABNORMAL HIGH (ref 65–99)
Glucose-Capillary: 118 mg/dL — ABNORMAL HIGH (ref 65–99)
Glucose-Capillary: 120 mg/dL — ABNORMAL HIGH (ref 65–99)

## 2016-06-14 LAB — CBC
HEMATOCRIT: 25.3 % — AB (ref 36.0–46.0)
HEMOGLOBIN: 8.5 g/dL — AB (ref 12.0–15.0)
MCH: 31.1 pg (ref 26.0–34.0)
MCHC: 33.6 g/dL (ref 30.0–36.0)
MCV: 92.7 fL (ref 78.0–100.0)
Platelets: 144 10*3/uL — ABNORMAL LOW (ref 150–400)
RBC: 2.73 MIL/uL — ABNORMAL LOW (ref 3.87–5.11)
RDW: 16.3 % — AB (ref 11.5–15.5)
WBC: 2.7 10*3/uL — ABNORMAL LOW (ref 4.0–10.5)

## 2016-06-14 NOTE — Progress Notes (Signed)
RN received report from ED. Pt arrived unit accompanied by family. Pt is alert and oriented, able to communicate needs. Will continue with current plan of care.

## 2016-06-14 NOTE — Progress Notes (Signed)
PROGRESS NOTE  Cristina Middleton  [id number] DOB: 1920-12-15 DOA: 06/13/2016 PCP: Jani Gravel, MD  Brief Narrative:   Cristina Middleton is a 83 y.o. female with history of diabetes mellitus type 2, hypertension, hyperlipidemia, hyperthyroidism, diverticulosis, OSA but noncompliant with CPAP, anxiety who presented with a 4-5 day history of fatigue, cough, and one large bloody stool on the date of admission.  BP and pulse were stable in the ER and she was afebrile, however, she was 85-88% on RA and was placed on 2L.  CXR demonstrated bilateral opacities.  WBC was normal. She was given ceftriaxone and azithromycin initially for CAP, however, her flu PCR became positive and she was started on tamiflu.  Her rectal exam by the ER MD demonstrated bright red blood and her hemoglobin has decreased from 11.8 to 8.2 g/dl.  She has had some additional stools since coming to the floor which have been more brown.  Weaning oxygen and will transfuse as needed.  Hoping to avoid procedures given age.  Was DNR at admission but changed her mind and now FULL CODE today.     Assessment & Plan:   Principal Problem:   Influenza with respiratory manifestation Active Problems:   Diabetes mellitus, type 2 (HCC)   Sleep apnea   GERD (gastroesophageal reflux disease)   Hypothyroidism   Acute blood loss anemia   GIB (gastrointestinal bleeding)   Bright red blood per rectum   Acute respiratory failure with hypoxia (HCC)   Reactive airway disease  Acute hypoxic respiratory failure due to influenza.  97% on 3L -  Wean oxygen -  Continue Tamiflu -  Blood cultures NGTD -  Ambulatory pulse ox in AM  Acute blood loss anemia due to BRBPR, likely a diverticular hemorrhage.  Pt of Dr. Watt Climes, last colonoscopy > 10 years ago due to age and demonstrated diverticula, internal and external hemorrhoids and three polyps which were biopsied -  Hold anticoagulation, NSAIDS -  hemoglobin continues to trend down  Generalized  anxiety disorder -  Continue Xanax and Lexapro  Hypothyroidism, stable, continue levothyroxine  Diabetes mellitus type 2 -  Hold metformin -  continue low-dose sliding scale insulin  Hyperlipidemia, stable, continue simvastatin  OSA, noncompliant with CPAP and unlikely to tolerate due to cough  GERD, stable, continue PPI  Hypokalemia, improved with oral and IV potassium replacement  Generalized weakness due to flu and anemia -  PT evaluation  DVT prophylaxis:  SCDs Code Status:  full Family Communication:  Patient and her daughter Disposition Plan:  Home in 1-2 days   Consultants:   none  Procedures:  none  Antimicrobials:  Anti-infectives    Start     Dose/Rate Route Frequency Ordered Stop   06/13/16 1830  oseltamivir (TAMIFLU) capsule 30 mg     30 mg Oral 2 times daily 06/13/16 1817 06/18/16 2159   06/13/16 1615  cefTRIAXone (ROCEPHIN) 1 g in dextrose 5 % 50 mL IVPB    Comments:  Give after cultures drawn   1 g 100 mL/hr over 30 Minutes Intravenous  Once 06/13/16 1604 06/13/16 1727   06/13/16 1615  azithromycin (ZITHROMAX) 500 mg in dextrose 5 % 250 mL IVPB    Comments:  Give after cultures drawn   500 mg 250 mL/hr over 60 Minutes Intravenous  Once 06/13/16 1604 06/13/16 1800       Subjective: Still feeling very weak and tired.  Having some ongoing cough and SOB.  Had a BM this morning but  did not see it.  Felt watery/liquidy.  Per RN, stool was brown water with some lump of brown stool in it.    Objective: Vitals:   06/13/16 2213 06/14/16 0542 06/14/16 0751 06/14/16 1357  BP:  (!) 103/50  (!) 105/50  Pulse:    75  Resp:  18  18  Temp:  98.6 F (37 C)  98.4 F (36.9 C)  TempSrc:  Oral  Oral  SpO2: 92% 96% 95% 95%  Weight:      Height:        Intake/Output Summary (Last 24 hours) at 06/14/16 1743 Last data filed at 06/14/16 1550  Gross per 24 hour  Intake          1876.67 ml  Output              200 ml  Net          1676.67 ml   Filed  Weights   06/13/16 1355  Weight: 64.4 kg (142 lb)    Examination:  General exam:  Adult female.  No acute distress.  HEENT:  NCAT, MMM Respiratory system:  Diminished bilateral breath sounds with wheeze and scattered rales, no rhonchi Cardiovascular system: Regular rate and rhythm, normal S1/S2. No murmurs, rubs, gallops or clicks.  Warm extremities Gastrointestinal system: Normal active bowel sounds, soft, nondistended, nontender. MSK:  Normal tone and bulk, no lower extremity edema Neuro:  Grossly intact    Data Reviewed: I have personally reviewed following labs and imaging studies  CBC:  Recent Labs Lab 06/13/16 1428 06/13/16 2237 06/14/16 0548 06/14/16 1141 06/14/16 1637  WBC 4.4  --  2.7*  --   --   HGB 10.2* 8.9* 8.5* 8.7* 8.2*  HCT 31.0* 26.1* 25.3* 25.9* 24.2*  MCV 92.8  --  92.7  --   --   PLT 176  --  144*  --   --    Basic Metabolic Panel:  Recent Labs Lab 06/13/16 1428 06/14/16 0548  NA 135 135  K 3.3* 3.5  CL 99* 104  CO2 26 26  GLUCOSE 93 113*  BUN 33* 19  CREATININE 1.06* 0.79  CALCIUM 8.5* 7.7*   GFR: Estimated Creatinine Clearance: 37.9 mL/min (by C-G formula based on SCr of 0.79 mg/dL). Liver Function Tests:  Recent Labs Lab 06/13/16 1428  AST 27  ALT 17  ALKPHOS 80  BILITOT 0.4  PROT 6.2*  ALBUMIN 3.8   No results for input(s): LIPASE, AMYLASE in the last 168 hours. No results for input(s): AMMONIA in the last 168 hours. Coagulation Profile:  Recent Labs Lab 06/13/16 1428  INR 0.95   Cardiac Enzymes: No results for input(s): CKTOTAL, CKMB, CKMBINDEX, TROPONINI in the last 168 hours. BNP (last 3 results) No results for input(s): PROBNP in the last 8760 hours. HbA1C: No results for input(s): HGBA1C in the last 72 hours. CBG:  Recent Labs Lab 06/13/16 2142 06/14/16 0726 06/14/16 1212 06/14/16 1723  GLUCAP 120* 118* 120* 105*   Lipid Profile: No results for input(s): CHOL, HDL, LDLCALC, TRIG, CHOLHDL, LDLDIRECT  in the last 72 hours. Thyroid Function Tests: No results for input(s): TSH, T4TOTAL, FREET4, T3FREE, THYROIDAB in the last 72 hours. Anemia Panel: No results for input(s): VITAMINB12, FOLATE, FERRITIN, TIBC, IRON, RETICCTPCT in the last 72 hours. Urine analysis:    Component Value Date/Time   COLORURINE YELLOW 06/13/2016 1813   APPEARANCEUR CLEAR 06/13/2016 1813   LABSPEC 1.013 06/13/2016 1813   PHURINE 5.0 06/13/2016 1813  GLUCOSEU NEGATIVE 06/13/2016 1813   HGBUR NEGATIVE 06/13/2016 1813   BILIRUBINUR NEGATIVE 06/13/2016 1813   KETONESUR NEGATIVE 06/13/2016 1813   PROTEINUR NEGATIVE 06/13/2016 1813   UROBILINOGEN 0.2 06/28/2014 0456   NITRITE NEGATIVE 06/13/2016 1813   LEUKOCYTESUR NEGATIVE 06/13/2016 1813   Sepsis Labs: @LABRCNTIP (procalcitonin:4,lacticidven:4)  ) Recent Results (from the past 240 hour(s))  Blood culture (routine x 2)     Status: None (Preliminary result)   Collection Time: 06/13/16  4:51 PM  Result Value Ref Range Status   Specimen Description BLOOD LEFT ANTECUBITAL  Final   Special Requests BOTTLES DRAWN AEROBIC AND ANAEROBIC 5 CC EACH  Final   Culture   Final    NO GROWTH < 24 HOURS Performed at [facility], [place] [address], [place], [place] [address]    Report Status PENDING  Incomplete  Blood culture (routine x 2)     Status: None (Preliminary result)   Collection Time: 06/13/16  4:51 PM  Result Value Ref Range Status   Specimen Description BLOOD RIGHT ANTECUBITAL  Final   Special Requests BOTTLES DRAWN AEROBIC AND ANAEROBIC 5CCEACH  Final   Culture   Final    NO GROWTH < 24 HOURS Performed at [facility], [place] [address] [place], [place] [address]    Report Status PENDING  Incomplete      Radiology Studies: Dg Chest 2 View  Result Date: 06/13/2016 CLINICAL DATA:  83 year old female with a history of cough for 1 week EXAM: CHEST  2 VIEW COMPARISON:  07/01/2014, 06/30/2014 FINDINGS: Cardiomediastinal silhouette unchanged in  size and contour. Calcifications of the aortic arch. Double density overlying the lower mediastinum again noted. Low lung volumes with patchy opacities at the right greater than left lung base. Lateral view demonstrates no large pleural effusion. No pneumothorax. Accentuated kyphotic curvature of the thoracic spine with multilevel degenerative changes. Unchanged configuration of vertebral bodies with no displaced fracture. Compare to the prior there has been removal of the gastric tube. IMPRESSION: New patchy opacities at the right greater than left lung bases concerning for infection given the history. Re- demonstration of large hiatal hernia. Aortic atherosclerosis. Signed, Dulcy Fanny. Earleen Newport, DO [facility] Electronically Signed   By: Corrie Mckusick D.O.   On: 06/13/2016 15:54     Scheduled Meds: . ALPRAZolam  0.25 mg Oral QHS  . escitalopram  20 mg Oral Daily  . insulin aspart  0-9 Units Subcutaneous TID WC  . ipratropium-albuterol  3 mL Nebulization TID  . levothyroxine  100 mcg Oral QAC breakfast  . oseltamivir  30 mg Oral BID  . pantoprazole  40 mg Oral BID AC  . simvastatin  20 mg Oral QHS   Continuous Infusions: . dextrose 5 % and 0.45 % NaCl with KCl 20 mEq/L 100 mL/hr at 06/14/16 0811     LOS: 1 day    Time spent: 30 min    Janece Canterbury, MD [facility] Pager [phone]  If 7PM-7AM, please contact night-coverage [email or web address] Password [facility] 06/14/2016, 5:43 PM

## 2016-06-15 DIAGNOSIS — K219 Gastro-esophageal reflux disease without esophagitis: Secondary | ICD-10-CM

## 2016-06-15 DIAGNOSIS — K625 Hemorrhage of anus and rectum: Secondary | ICD-10-CM

## 2016-06-15 DIAGNOSIS — G4730 Sleep apnea, unspecified: Secondary | ICD-10-CM

## 2016-06-15 DIAGNOSIS — J9601 Acute respiratory failure with hypoxia: Secondary | ICD-10-CM

## 2016-06-15 DIAGNOSIS — E119 Type 2 diabetes mellitus without complications: Secondary | ICD-10-CM

## 2016-06-15 DIAGNOSIS — E785 Hyperlipidemia, unspecified: Secondary | ICD-10-CM

## 2016-06-15 DIAGNOSIS — D62 Acute posthemorrhagic anemia: Secondary | ICD-10-CM

## 2016-06-15 DIAGNOSIS — J111 Influenza due to unidentified influenza virus with other respiratory manifestations: Secondary | ICD-10-CM

## 2016-06-15 DIAGNOSIS — E039 Hypothyroidism, unspecified: Secondary | ICD-10-CM

## 2016-06-15 LAB — CBC
HEMATOCRIT: 25.7 % — AB (ref 36.0–46.0)
Hemoglobin: 8.6 g/dL — ABNORMAL LOW (ref 12.0–15.0)
MCH: 31.7 pg (ref 26.0–34.0)
MCHC: 33.5 g/dL (ref 30.0–36.0)
MCV: 94.8 fL (ref 78.0–100.0)
Platelets: 150 10*3/uL (ref 150–400)
RBC: 2.71 MIL/uL — ABNORMAL LOW (ref 3.87–5.11)
RDW: 16.6 % — AB (ref 11.5–15.5)
WBC: 3.4 10*3/uL — ABNORMAL LOW (ref 4.0–10.5)

## 2016-06-15 LAB — BASIC METABOLIC PANEL
Anion gap: 5 (ref 5–15)
BUN: 9 mg/dL (ref 6–20)
CO2: 27 mmol/L (ref 22–32)
Calcium: 8.2 mg/dL — ABNORMAL LOW (ref 8.9–10.3)
Chloride: 104 mmol/L (ref 101–111)
Creatinine, Ser: 0.66 mg/dL (ref 0.44–1.00)
GFR calc Af Amer: >60 mL/min (ref >60)
GLUCOSE: 111 mg/dL — AB (ref 65–99)
POTASSIUM: 3.7 mmol/L (ref 3.5–5.1)
Sodium: 136 mmol/L (ref 135–145)

## 2016-06-15 LAB — GLUCOSE, CAPILLARY
GLUCOSE-CAPILLARY: 123 mg/dL — AB (ref 65–99)
Glucose-Capillary: 114 mg/dL — ABNORMAL HIGH (ref 65–99)
Glucose-Capillary: 109 mg/dL — ABNORMAL HIGH (ref 65–99)
Glucose-Capillary: 115 mg/dL — ABNORMAL HIGH (ref 65–99)

## 2016-06-15 MED ORDER — ALPRAZOLAM 0.25 MG PO TABS: 0.2500 mg | ORAL_TABLET | Freq: Two times a day (BID) | ORAL | Status: DC | PRN

## 2016-06-15 MED ORDER — DM-GUAIFENESIN ER 30-600 MG PO TB12
1.0000 | ORAL_TABLET | Freq: Two times a day (BID) | ORAL | Status: DC
  Administered 2016-06-15 – 2016-06-16 (×3): 1 via ORAL
  Filled 2016-06-15 (×3): qty 1

## 2016-06-15 MED ORDER — SODIUM CHLORIDE 3 % IN NEBU
4.0000 mL | INHALATION_SOLUTION | Freq: Every day | RESPIRATORY_TRACT | Status: DC
  Administered 2016-06-16: 4 mL via RESPIRATORY_TRACT
  Filled 2016-06-15 (×2): qty 4

## 2016-06-15 NOTE — Evaluation (Signed)
Physical Therapy Evaluation Patient Details Name: Cristina Middleton MRN: [id number] DOB: 1921-03-04 Today's Date: 06/15/2016   History of Present Illness  Pt is a 83 y.o. female with history of diabetes mellitus type 2, hypertension, hyperlipidemia, hyperthyroidism, diverticulosis, OSA but noncompliant with CPAP, anxiety and admitted for acute hypoxic respiratory failure due to influenza  Clinical Impression  Pt admitted with above diagnosis. Pt currently with functional limitations due to the deficits listed below (see PT Problem List).  Pt will benefit from skilled PT to increase their independence and safety with mobility to allow discharge to the venue listed below.  Pt ambulated good distance in hallway and SpO2 remained in the 90s so oxygen [place] left off of pt (RN aware, continuous pulse monitoring).  [name] present and states she will go home with pt upon d/c (family also lives in pt's neighborhood).  Pt reports feeling better and will likely be close to baseline upon d/c.     Follow Up Recommendations No PT follow up;Supervision/Assistance - 24 hour    Equipment Recommendations  None recommended by PT    Recommendations for Other Services       Precautions / Restrictions Precautions Precautions: Fall Precaution Comments: monitor sats      Mobility  Bed Mobility Overal bed mobility: Needs Assistance Bed Mobility: Supine to Sit;Sit to Supine     Supine to sit: Min guard Sit to supine: Supervision   General bed mobility comments: RN provided a hand for pt to self assist trunk upright (sitting EOB to take meds)  Transfers Overall transfer level: Needs assistance Equipment used: None Transfers: Sit to/from Stand Sit to Stand: Min guard;Supervision         General transfer comment: min/guard for safety and lines  Ambulation/Gait Ambulation/Gait assistance: Min guard Ambulation Distance (Feet): 260 Feet Assistive device: Straight cane Gait Pattern/deviations:  Step-through pattern;Decreased stride length     General Gait Details: initially a little unsteady however improved with distance, SpO2 briefly dropped to 82% on room air at the start of ambulation however pt had been gripping cane, SpO2 remained 93-98% on room air   Stairs            Wheelchair Mobility    Modified Rankin (Stroke Patients Only)       Balance                                             Pertinent Vitals/Pain Pain Assessment: No/denies pain    Home Living Family/patient expects to be discharged to:: Private residence Living Arrangements: Alone Available Help at Discharge: Family;Available 24 hours/day (daughter can stay with pt upon d/c) Type of Home: House       Home Layout: One level Home Equipment: Walker - 2 wheels;Cane - single point;Shower seat      Prior Function Level of Independence: Independent with assistive device(s)         Comments: uses cane     Hand Dominance        Extremity/Trunk Assessment        Lower Extremity Assessment Lower Extremity Assessment: Overall WFL for tasks assessed       Communication   Communication: HOH  Cognition Arousal/Alertness: Awake/alert Behavior During Therapy: WFL for tasks assessed/performed Overall Cognitive Status: Within Functional Limits for tasks assessed  General Comments      Exercises     Assessment/Plan    PT Assessment Patient needs continued PT services  PT Problem List Decreased activity tolerance;Decreased mobility          PT Treatment Interventions DME instruction;Gait training;Functional mobility training;Therapeutic exercise;Therapeutic activities;Patient/family education    PT Goals (Current goals can be found in the Care Plan section)  Acute Rehab PT Goals PT Goal Formulation: With patient Time For Goal Achievement: 06/22/16 Potential to Achieve Goals: Good    Frequency Min 3X/week   Barriers to  discharge        Co-evaluation               End of Session   Activity Tolerance: Patient tolerated treatment well Patient left: in bed;with call bell/phone within reach;with family/visitor present Nurse Communication: Mobility status         Time: 1010-1030 PT Time Calculation (min) (ACUTE ONLY): 20 min   Charges:   PT Evaluation $PT Eval Moderate Complexity: 1 Procedure     PT G Codes:        Ethie Curless,KATHrine E 06/15/2016, 12:56 PM Carmelia Bake, PT, DPT 06/15/2016 Pager: [phone]

## 2016-06-15 NOTE — Progress Notes (Signed)
PROGRESS NOTE    Krisandra Brizuela [facility]  [id number] DOB: [birth year] DOA: 06/13/2016 PCP: Jani Gravel, MD   Brief Narrative: Mykah Hysell Forbisis a 83 y.o.femalewith history of diabetes mellitus type 2, hypertension, hyperlipidemia, hyperthyroidism, diverticulosis, OSA but noncompliant with CPAP, anxiety who presented with a 4-5 day history of fatigue, cough, and one large bloody stool on the date of admission.  BP and pulse were stable in the ER and she was afebrile, however, she was 85-88% on RA and was placed on 2L. CXR demonstrated bilateral opacities. WBC was normal. She was given ceftriaxone and azithromycin initially for CAP, however, her flu PCR became positive and she was started on tamiflu. Her rectal exam by the ER MD demonstrated bright red blood and her hemoglobin has decreased from 11.8 to 8.2 g/dl.  She has had some additional stools since coming to the floor which have been more brown.  Weaning oxygen and will transfuse as needed.  Hoping to avoid procedures given age.  Was DNR at admission but changed her mind and now FULL CODE as of yesterday. States she feels stronger today and is going to try to walk with PT later today.   Assessment & Plan:   Principal Problem:   Influenza with respiratory manifestation Active Problems:   Diabetes mellitus, type 2 (HCC)   Sleep apnea   GERD (gastroesophageal reflux disease)   Hypothyroidism   Acute blood loss anemia   GIB (gastrointestinal bleeding)   Bright red blood per rectum   Acute respiratory failure with hypoxia (HCC)   Reactive airway disease  Acute hypoxic respiratory failuredue to Influenza A; improving - Droplet Precautions - Acetaminophen 325 mg po q6hprn for Mild/Moderate Pain/Fever/Headache - Wean oxygen and leave off O2; Maintain Saturations > 92% - C/w DuoNeb TID; Added Hypertonic Saline Nebs 4 mL Daily x 3 days - Continue Tamiflu 30 mg po BID x 5 days -  Blood cultures NGTD - Added Mucinex DM 1 tab po BID -   Ambulatory pulse Ox this AM showed she ambulated a good distance in the Hallway and SpO2 remained >90%  Acute blood loss anemiadue to BRBPR, likely a diverticular hemorrhage.  -Pt of Dr. Watt Climes, last colonoscopy >10 years ago due to age and demonstrated diverticula, internal and external hemorrhoids and three polyps which were biopsied - FOBT x 1 Negative (on 06/13/16) - Hold anticoagulation, NSAIDS - hemoglobin/Hct Stable now at 8.6/25.7 - ? Dilutional Componenet as patient has been on D5W 1/2 NS + 20 mEQ of Kcl; Will D/C IVF - Repeat CBC in AM and if stable will D/C and need to follow up with Gastroenterology as an outpateint  Generalized anxiety disorder - Continue Xanax 0.25 mg po qHS and Lexapro 20 mg po Daily  Hypothyroidism, - Stable, continue levothyroxine 100 mcg po Daily   Diabetes mellitus type 2 - Hold metformin - continue low-dose Sensitive Novolog sliding scale insulin  Hyperlipidemia, -stable, continue simvastatin 20 mg po qHS  OSA, -noncompliant with CPAP and unlikely to tolerate due to cough  GERD,  -stable, continue PPI Pantoprazole 40 mg po BID  Hypokalemia,  -improved with oral and IV potassium replacement -K+ was 3.7 today -Repeat BMP in AM  Generalized weakness due to flu and anemia -  PT evaluation showed no PT follow up  DVT prophylaxis: SCDs Code Status: FULL CODE Family Communication: Discussed with Daughter at bedside Disposition Plan: Likely Home in AM  Consultants:   None   Procedures: None  Antimicrobials:  Anti-infectives  Start     Dose/Rate Route Frequency Ordered Stop   06/13/16 1830  oseltamivir (TAMIFLU) capsule 30 mg     30 mg Oral 2 times daily 06/13/16 1817 06/18/16 2159   06/13/16 1615  cefTRIAXone (ROCEPHIN) 1 g in dextrose 5 % 50 mL IVPB    Comments:  Give after cultures drawn   1 g 100 mL/hr over 30 Minutes Intravenous  Once 06/13/16 1604 06/13/16 1727   06/13/16 1615  azithromycin (ZITHROMAX) 500  mg in dextrose 5 % 250 mL IVPB    Comments:  Give after cultures drawn   500 mg 250 mL/hr over 60 Minutes Intravenous  Once 06/13/16 1604 06/13/16 1800     Subjective: Seen and examined and stated she felt better but was still wheezing significantly. No Nausea or Vomiting. Had place on leg where she started bleeding from Varicose Vein but now improved. Hard of hearing but states she feels stronger today.   Objective: Vitals:   06/14/16 2103 06/14/16 2116 06/15/16 0600 06/15/16 0741  BP: (!) 111/50  124/66   Pulse: 75  62 63  Resp: 18  18 17   Temp: 98.4 F (36.9 C)  97.6 F (36.4 C)   TempSrc: Oral  Oral   SpO2: 94% 96% 99% 95%  Weight:      Height:        Intake/Output Summary (Last 24 hours) at 06/15/16 1355 Last data filed at 06/15/16 1233  Gross per 24 hour  Intake          2283.34 ml  Output             1050 ml  Net          1233.34 ml   Filed Weights   06/13/16 1355  Weight: 64.4 kg (142 lb)   Examination: Physical Exam:  Constitutional: Elderly female and appears calm and comfortable in NAD ENMT: External Ears, Nose appear normal. Very Hard of hearing.  Neck: Appears normal, supple, no cervical masses, normal ROM, no appreciable thyromegaly, no JVD Respiratory: Diminished bilaterally with significant expiratory wheezing and crackles. No rhonchi Normal respiratory effort and patient is not tachypenic. No accessory muscle use. Was wearing supplemental O2 via [place] when Examined.  Cardiovascular: RRR, no murmurs / rubs / gallops. S1 and S2 auscultated. No extremity edema. Abdomen: Soft, non-tender, non-distended. No masses palpated. No appreciable hepatosplenomegaly. Bowel sounds positive x4.  GU: Deferred. Musculoskeletal: No clubbing / cyanosis of digits/nails. No joint deformity upper and lower extremities. No Contractures Skin: No rashes, has Left leg place where she bled from varicose vein now scabbed and dry. Skin warm and dry; Significant Lower extremity varicose  veins Neurologic: Grossly intact with difficulty hearing without her hearing aids. Sensation normal in all 4 extremities.  Psychiatric: Normal judgment and insight. Alert and oriented x 3. Normal and pleasant mood and appropriate affect.   Data Reviewed: I have personally reviewed following labs and imaging studies  CBC:  Recent Labs Lab 06/13/16 1428  06/14/16 0548 06/14/16 1141 06/14/16 1637 06/14/16 2244 06/15/16 0528  WBC 4.4  --  2.7*  --   --   --  3.4*  HGB 10.2*  < > 8.5* 8.7* 8.2* 8.1* 8.6*  HCT 31.0*  < > 25.3* 25.9* 24.2* 24.0* 25.7*  MCV 92.8  --  92.7  --   --   --  94.8  PLT 176  --  144*  --   --   --  150  < > =  values in this interval not displayed. Basic Metabolic Panel:  Recent Labs Lab 06/13/16 1428 06/14/16 0548 06/15/16 0528  NA 135 135 136  K 3.3* 3.5 3.7  CL 99* 104 104  CO2 26 26 27   GLUCOSE 93 113* 111*  BUN 33* 19 9  CREATININE 1.06* 0.79 0.66  CALCIUM 8.5* 7.7* 8.2*   GFR: Estimated Creatinine Clearance: 37.9 mL/min (by C-G formula based on SCr of 0.66 mg/dL). Liver Function Tests:  Recent Labs Lab 06/13/16 1428  AST 27  ALT 17  ALKPHOS 80  BILITOT 0.4  PROT 6.2*  ALBUMIN 3.8   No results for input(s): LIPASE, AMYLASE in the last 168 hours. No results for input(s): AMMONIA in the last 168 hours. Coagulation Profile:  Recent Labs Lab 06/13/16 1428  INR 0.95   Cardiac Enzymes: No results for input(s): CKTOTAL, CKMB, CKMBINDEX, TROPONINI in the last 168 hours. BNP (last 3 results) No results for input(s): PROBNP in the last 8760 hours. HbA1C: No results for input(s): HGBA1C in the last 72 hours. CBG:  Recent Labs Lab 06/14/16 1212 06/14/16 1723 06/14/16 2103 06/15/16 0730 06/15/16 1300  GLUCAP 120* 105* 140* 114* 123*   Lipid Profile: No results for input(s): CHOL, HDL, LDLCALC, TRIG, CHOLHDL, LDLDIRECT in the last 72 hours. Thyroid Function Tests: No results for input(s): TSH, T4TOTAL, FREET4, T3FREE, THYROIDAB  in the last 72 hours. Anemia Panel: No results for input(s): VITAMINB12, FOLATE, FERRITIN, TIBC, IRON, RETICCTPCT in the last 72 hours. Sepsis Labs:  Recent Labs Lab 06/13/16 1704  LATICACIDVEN 0.85    Recent Results (from the past 240 hour(s))  Blood culture (routine x 2)     Status: None (Preliminary result)   Collection Time: 06/13/16  4:51 PM  Result Value Ref Range Status   Specimen Description BLOOD LEFT ANTECUBITAL  Final   Special Requests BOTTLES DRAWN AEROBIC AND ANAEROBIC 5 CC EACH  Final   Culture   Final    NO GROWTH < 24 HOURS Performed at [facility], [place] [address], [place], [place] [address]    Report Status PENDING  Incomplete  Blood culture (routine x 2)     Status: None (Preliminary result)   Collection Time: 06/13/16  4:51 PM  Result Value Ref Range Status   Specimen Description BLOOD RIGHT ANTECUBITAL  Final   Special Requests BOTTLES DRAWN AEROBIC AND ANAEROBIC 5CCEACH  Final   Culture   Final    NO GROWTH < 24 HOURS Performed at [facility], [place] [address], [place], [place] [address]    Report Status PENDING  Incomplete    Radiology Studies: Dg Chest 2 View  Result Date: 06/13/2016 CLINICAL DATA:  83 year old female with a history of cough for 1 week EXAM: CHEST  2 VIEW COMPARISON:  07/01/2014, 06/30/2014 FINDINGS: Cardiomediastinal silhouette unchanged in size and contour. Calcifications of the aortic arch. Double density overlying the lower mediastinum again noted. Low lung volumes with patchy opacities at the right greater than left lung base. Lateral view demonstrates no large pleural effusion. No pneumothorax. Accentuated kyphotic curvature of the thoracic spine with multilevel degenerative changes. Unchanged configuration of vertebral bodies with no displaced fracture. Compare to the prior there has been removal of the gastric tube. IMPRESSION: New patchy opacities at the right greater than left lung bases concerning for infection given  the history. Re- demonstration of large hiatal hernia. Aortic atherosclerosis. Signed, Dulcy Fanny. Earleen Newport, DO [facility] Electronically Signed  By: Corrie Mckusick D.O.   On: 06/13/2016 15:54   Scheduled Meds: . ALPRAZolam  0.25 mg Oral QHS  . dextromethorphan-guaiFENesin  1 tablet Oral BID  . escitalopram  20 mg Oral Daily  . insulin aspart  0-9 Units Subcutaneous TID WC  . ipratropium-albuterol  3 mL Nebulization TID  . levothyroxine  100 mcg Oral QAC breakfast  . oseltamivir  30 mg Oral BID  . pantoprazole  40 mg Oral BID AC  . simvastatin  20 mg Oral QHS  . sodium chloride HYPERTONIC  4 mL Nebulization Daily   Continuous Infusions:   LOS: 2 days   Kerney Elbe, DO [facility] Pager [phone]  If 7PM-7AM, please contact night-coverage [email or web address] Password [email or web address] 06/15/2016, 1:55 PM

## 2016-06-15 NOTE — Care Management Note (Signed)
Case Management Note  Patient Details  Name: Cristina Middleton MRN: [id number] Date of Birth: 08/14/1920  Subjective/Objective: 83 y.o. F admitted 06/13/2016 from home where she has assistance from adult daughter. PT eval reveals no HH needs.                    Action/Plan:CM will sign off for now but will be available should additional discharge needs arise or disposition change.    Expected Discharge Date:   (unknown)               Expected Discharge Plan:  Home/Self Care  In-House Referral:  NA  Discharge planning Services  CM Consult  Post Acute Care Choice:  NA Choice offered to:  Patient, Adult Children (Daughter at bedside)  DME Arranged:  N/A DME Agency:  NA  HH Arranged:  NA HH Agency:  NA  Status of Service:  Completed, signed off  If discussed at [place] of Stay Meetings, dates discussed:    Additional Comments:  Delrae Sawyers, RN 06/15/2016, 11:44 AM

## 2016-06-16 LAB — CBC
HEMATOCRIT: 26.8 % — AB (ref 36.0–46.0)
Hemoglobin: 9.1 g/dL — ABNORMAL LOW (ref 12.0–15.0)
MCH: 31.8 pg (ref 26.0–34.0)
MCHC: 34 g/dL (ref 30.0–36.0)
MCV: 93.7 fL (ref 78.0–100.0)
Platelets: 162 10*3/uL (ref 150–400)
RBC: 2.86 MIL/uL — AB (ref 3.87–5.11)
RDW: 16.1 % — ABNORMAL HIGH (ref 11.5–15.5)
WBC: 3.3 10*3/uL — ABNORMAL LOW (ref 4.0–10.5)

## 2016-06-16 LAB — BASIC METABOLIC PANEL
ANION GAP: 5 (ref 5–15)
BUN: 9 mg/dL (ref 6–20)
CHLORIDE: 104 mmol/L (ref 101–111)
CO2: 28 mmol/L (ref 22–32)
Calcium: 8.4 mg/dL — ABNORMAL LOW (ref 8.9–10.3)
Creatinine, Ser: 0.72 mg/dL (ref 0.44–1.00)
Glucose, Bld: 92 mg/dL (ref 65–99)
POTASSIUM: 3.8 mmol/L (ref 3.5–5.1)
SODIUM: 137 mmol/L (ref 135–145)

## 2016-06-16 LAB — GLUCOSE, CAPILLARY
Glucose-Capillary: 95 mg/dL (ref 65–99)
Glucose-Capillary: 150 mg/dL — ABNORMAL HIGH (ref 65–99)

## 2016-06-16 MED ORDER — OSELTAMIVIR PHOSPHATE 30 MG PO CAPS: 30.0000 mg | ORAL_CAPSULE | Freq: Two times a day (BID) | ORAL | 0 refills | Status: DC

## 2016-06-16 MED ORDER — DM-GUAIFENESIN ER 30-600 MG PO TB12: 1.0000 | ORAL_TABLET | Freq: Two times a day (BID) | ORAL | 0 refills | Status: DC

## 2016-06-16 NOTE — Progress Notes (Signed)
Patient and patient's daughter given discharge instructions. No questions or concerns at this time.

## 2016-06-16 NOTE — Discharge Summary (Signed)
Physician Discharge Summary  Cristina Middleton [facility] [id number] DOB: 1920-12-14 DOA: 06/13/2016  PCP: Jani Gravel, MD  Admit date: 06/13/2016 Discharge date: 06/16/2016  Admitted From: Home Disposition:  Home  Recommendations for Outpatient Follow-up:  1. Follow up with PCP Dr. Jani Gravel in 1-2 weeks 2. Follow up with Gastroenterology as an outpatient 3. Please obtain BMP/CBC in one week 4. Please follow up on the following pending results:  Home Health: YES Equipment/Devices: 3 Liters of O2  Discharge Condition: Stable CODE STATUS: FULL CODE Diet recommendation: Heart Healthy / Carb Modified  Brief/Interim Summary: Mckensie Founds Forbisis a 83 y.o.femalewith history of diabetes mellitus type 2, hypertension, hyperlipidemia, hyperthyroidism, diverticulosis, OSA but noncompliant with CPAP, anxiety who presentedwith a 4-5 day history of fatigue, cough, and one large bloodystool on the date of admission. BP and pulse were stable in the ER and she was afebrile, however, she was 85-88% on RA and was placed on 2L. CXR demonstrated bilateral opacities. WBC was normal. She was given ceftriaxone and azithromycin initially for CAP, however, her flu PCR became positive and she was started on tamiflu. Her rectal exam by the ER MD demonstrated bright red blood and her hemoglobin has decreased from 11.8 to 8.2 g/dl. She has had some additional stools since coming to the floor which have been more brown. Stools improved with no re-occurrence of BRBPR and Hb improved. Patient required O2 at D/C because she desaturated on Room Air and was sent on 3 Liters continuous as she gets over the Flu. At this time patient deemed medically stable to D/C home and will follow up with Dr. Maudie Mercury as an outpatient.   Discharge Diagnoses:  Principal Problem:   Influenza with respiratory manifestation Active Problems:   Diabetes mellitus, type 2 (HCC)   Sleep apnea   GERD (gastroesophageal reflux disease)   Hypothyroidism    Acute blood loss anemia   GIB (gastrointestinal bleeding)   Bright red blood per rectum   Acute respiratory failure with hypoxia (HCC)   Reactive airway disease  Acute hypoxic respiratory failuredue to Influenza A - Droplet Precautions - Acetaminophen 325 mg po q6hprn for Mild/Moderate Pain/Fever/Headache - Unable to Wean from O2 as she desaturated; Will Discharge on Home O2 at 3 liters. - Wheezing resolved - Continue Tamiflu 30 mg po BID for total of 5 days - Blood cultures NGTD - C/w Mucinex DM 1 tab po BID - Ambulatory pulse Ox yesterday AM showed she ambulated a good distance in the Hallway and SpO2 remained >90% however she required O2 this AM on Room Air  Acute blood loss anemiadue to BRBPR, likely a diverticular hemorrhage.  -Pt of Dr. Watt Climes, last colonoscopy >10 years ago due to age and demonstrated diverticula, internal and external hemorrhoids and three polyps which were biopsied - FOBT x 1 Negative (on 06/13/16) - Hold anticoagulation, NSAIDS - hemoglobin/Hct Stable now at 9.1/26.8 - Repeat CBC as an outpatient and need to follow up with Gastroenterology as an outpateint  Generalized anxiety disorder - Continue Xanax 0.25 mg po qHS and Lexapro 20 mg po Daily  Hypothyroidism, - Stable, continue levothyroxine 100 mcg po Daily   Diabetes mellitus type 2 - Continue Home Metformin  Hyperlipidemia, -stable, continue simvastatin 20 mg po qHS  OSA, -noncompliant with CPAP and unlikely to tolerate due to cough -Will need O2 at D/C  GERD,  -stable, continue PPI Pantoprazole 40 mg po BID  Hypokalemia, improved -Repeat CMP as an outpatient  Generalized weakness due to flu and anemia -  PT evaluation showed no PT follow up  Discharge Instructions Discharge Instructions    Call MD for:  difficulty breathing, headache or visual disturbances    Complete by:  As directed    Call MD for:  persistant dizziness or light-headedness    Complete by:  As  directed    Call MD for:  persistant nausea and vomiting    Complete by:  As directed    Call MD for:  severe uncontrolled pain    Complete by:  As directed    Call MD for:  temperature >100.4    Complete by:  As directed    Diet - low sodium heart healthy    Complete by:  As directed    Discharge instructions    Complete by:  As directed    Follow up with PCP Dr. Maudie Mercury as an outpatient. Take all medication as prescribed. If symptoms worsen or change please return to the ED for evaluation.   Increase activity slowly    Complete by:  As directed      Allergies as of 06/16/2016      Reactions   Other Other (See Comments)   Pt is allergic to Alka-Seltzer tablets.   Reaction:  Hallucinations    Penicillins Hives, Itching, Swelling, Other (See Comments)   Reaction:  Unspecified swelling reaction Has patient had a PCN reaction causing immediate rash, facial/tongue/throat swelling, SOB or lightheadedness with hypotension: Yes Has patient had a PCN reaction causing severe rash involving mucus membranes or skin necrosis: No Has patient had a PCN reaction that required hospitalization No Has patient had a PCN reaction occurring within the last 10 years: No If all of the above answers are "NO", then may proceed with Cephalosporin use.   Sulfa Antibiotics Hives, Itching, Swelling, Other (See Comments)   Reaction:  Unspecified swelling reaction      Medication List    TAKE these medications   acetaminophen 325 MG tablet Commonly known as:  TYLENOL Take 325 mg by mouth every 6 (six) hours as needed for mild pain, moderate pain, fever or headache.   ALPRAZolam 0.25 MG tablet Commonly known as:  XANAX Take 0.25 mg by mouth at bedtime.   aspirin EC 81 MG tablet Take 81 mg by mouth at bedtime.   calcium carbonate 1500 (600 Ca) MG Tabs tablet Commonly known as:  OSCAL Take 600 mg of elemental calcium by mouth daily.   CEREFOLIN PO Take 1 tablet by mouth daily.   CINNAMON PO Take 1  tablet by mouth daily.   dextromethorphan-guaiFENesin 30-600 MG 12hr tablet Commonly known as:  MUCINEX DM Take 1 tablet by mouth 2 (two) times daily.   diphenhydramine-acetaminophen 25-500 MG Tabs tablet Commonly known as:  TYLENOL PM Take 1 tablet by mouth at bedtime as needed (for sleep/pain).   escitalopram 20 MG tablet Commonly known as:  LEXAPRO Take 20 mg by mouth daily.   indapamide 2.5 MG tablet Commonly known as:  LOZOL Take 2.5 mg by mouth 3 (three) times a week. Pt takes on Sunday, Wednesday, and Friday.   levothyroxine 100 MCG tablet Commonly known as:  SYNTHROID, LEVOTHROID Take 100 mcg by mouth daily before breakfast.   loratadine 10 MG tablet Commonly known as:  CLARITIN Take 10 mg by mouth daily as needed for allergies.   metFORMIN 500 MG tablet Commonly known as:  GLUCOPHAGE Take 250 mg by mouth daily after breakfast.   oseltamivir 30 MG capsule Commonly known as:  TAMIFLU Take  1 capsule (30 mg total) by mouth 2 (two) times daily.   polyethylene glycol packet Commonly known as:  MIRALAX / GLYCOLAX Take 17 g by mouth daily as needed for moderate constipation.   PRESERVISION AREDS 2 Caps Take 1 capsule by mouth daily.   RABEprazole 20 MG tablet Commonly known as:  ACIPHEX Take 20 mg by mouth 2 (two) times daily.   simvastatin 20 MG tablet Commonly known as:  ZOCOR Take 20 mg by mouth at bedtime.   vitamin B-12 500 MCG tablet Commonly known as:  CYANOCOBALAMIN Take 500 mcg by mouth daily.      Follow-up Information    [facility] Follow up.   Why:  CPAP and home oxygen  Contact information: [address] [place] [address] [phone]        [facility] Follow up.   Why:  home health physical therapy and nurse Contact information: [place] [id number] [phone]          Allergies  Allergen Reactions  . Other Other (See Comments)    Pt is allergic to Alka-Seltzer  tablets.   Reaction:  Hallucinations   . Penicillins Hives, Itching, Swelling and Other (See Comments)    Reaction:  Unspecified swelling reaction Has patient had a PCN reaction causing immediate rash, facial/tongue/throat swelling, SOB or lightheadedness with hypotension: Yes Has patient had a PCN reaction causing severe rash involving mucus membranes or skin necrosis: No Has patient had a PCN reaction that required hospitalization No Has patient had a PCN reaction occurring within the last 10 years: No If all of the above answers are "NO", then may proceed with Cephalosporin use.  . Sulfa Antibiotics Hives, Itching, Swelling and Other (See Comments)    Reaction:  Unspecified swelling reaction   Consultations:  None  Procedures/Studies: Dg Chest 2 View  Result Date: 06/13/2016 CLINICAL DATA:  83 year old female with a history of cough for 1 week EXAM: CHEST  2 VIEW COMPARISON:  07/01/2014, 06/30/2014 FINDINGS: Cardiomediastinal silhouette unchanged in size and contour. Calcifications of the aortic arch. Double density overlying the lower mediastinum again noted. Low lung volumes with patchy opacities at the right greater than left lung base. Lateral view demonstrates no large pleural effusion. No pneumothorax. Accentuated kyphotic curvature of the thoracic spine with multilevel degenerative changes. Unchanged configuration of vertebral bodies with no displaced fracture. Compare to the prior there has been removal of the gastric tube. IMPRESSION: New patchy opacities at the right greater than left lung bases concerning for infection given the history. Re- demonstration of large hiatal hernia. Aortic atherosclerosis. Signed, Dulcy Fanny. Earleen Newport, DO [facility] Radiology Electronically Signed   By: Corrie Mckusick D.O.   On: 06/13/2016 15:54    Subjective: Seen and examined and was much improved breathing wise however desaturated on Room Air. No Nausea  or Vomiting. Ready to be D/C'd Home.  Discharge Exam: Vitals:   06/16/16 0846 06/16/16 1443  BP:    Pulse: 70 66  Resp: 20 18  Temp:     Vitals:   06/16/16 0418 06/16/16 0846 06/16/16 0938 06/16/16 1443  BP: (!) 110/53     Pulse: 68 70  66  Resp: 20 20  18   Temp: 98.8 F (37.1 C)     TempSrc: Oral     SpO2: 90% 97% 98% 98%  Weight:      Height:       General:  Pt is alert, awake, not in acute distress Cardiovascular: RRR, S1/S2 +, no rubs, no gallops Respiratory: Diminished bilaterally, no appreciable wheezing, no rhonchi; Patient not tachypenic but desaturated to mid 80's on room air during conversation. Abdominal: Soft, NT, ND, bowel sounds + Extremities: no edema, no cyanosis; Has severe varicose veins  The results of significant diagnostics from this hospitalization (including imaging, microbiology, ancillary and laboratory) are listed below for reference.    Microbiology: Recent Results (from the past 240 hour(s))  Blood culture (routine x 2)     Status: None (Preliminary result)   Collection Time: 06/13/16  4:51 PM  Result Value Ref Range Status   Specimen Description BLOOD LEFT ANTECUBITAL  Final   Special Requests BOTTLES DRAWN AEROBIC AND ANAEROBIC 5 CC EACH  Final   Culture   Final    NO GROWTH 3 DAYS Performed at [facility], [address] [place], [place] [address]    Report Status PENDING  Incomplete  Blood culture (routine x 2)     Status: None (Preliminary result)   Collection Time: 06/13/16  4:51 PM  Result Value Ref Range Status   Specimen Description BLOOD RIGHT ANTECUBITAL  Final   Special Requests BOTTLES DRAWN AEROBIC AND ANAEROBIC 5CCEACH  Final   Culture   Final    NO GROWTH 3 DAYS Performed at [facility], [place] [address], [place], [place] [address]    Report Status PENDING  Incomplete    Labs: BNP (last 3 results) No results for input(s): BNP in the last 8760 hours. Basic Metabolic Panel:  Recent Labs Lab 06/13/16 1428  06/14/16 0548 06/15/16 0528 06/16/16 0515  NA 135 135 136 137  K 3.3* 3.5 3.7 3.8  CL 99* 104 104 104  CO2 26 26 27 28   GLUCOSE 93 113* 111* 92  BUN 33* 19 9 9   CREATININE 1.06* 0.79 0.66 0.72  CALCIUM 8.5* 7.7* 8.2* 8.4*   Liver Function Tests:  Recent Labs Lab 06/13/16 1428  AST 27  ALT 17  ALKPHOS 80  BILITOT 0.4  PROT 6.2*  ALBUMIN 3.8   No results for input(s): LIPASE, AMYLASE in the last 168 hours. No results for input(s): AMMONIA in the last 168 hours. CBC:  Recent Labs Lab 06/13/16 1428  06/14/16 0548 06/14/16 1141 06/14/16 1637 06/14/16 2244 06/15/16 0528 06/16/16 0515  WBC 4.4  --  2.7*  --   --   --  3.4* 3.3*  HGB 10.2*  < > 8.5* 8.7* 8.2* 8.1* 8.6* 9.1*  HCT 31.0*  < > 25.3* 25.9* 24.2* 24.0* 25.7* 26.8*  MCV 92.8  --  92.7  --   --   --  94.8 93.7  PLT 176  --  144*  --   --   --  150 162  < > = values in this interval not displayed. Cardiac Enzymes: No results for input(s): CKTOTAL, CKMB, CKMBINDEX, TROPONINI in the last 168 hours. BNP: Invalid input(s): POCBNP CBG:  Recent Labs Lab 06/15/16 1300 06/15/16 1642 06/15/16 2021 06/16/16 0744 06/16/16 1132  GLUCAP 123* 109* 115* 95 150*   D-Dimer No results for input(s): DDIMER in the last 72 hours. Hgb A1c No results for input(s): HGBA1C in the last 72 hours. Lipid Profile No results for input(s): CHOL, HDL, LDLCALC, TRIG, CHOLHDL, LDLDIRECT in the last 72 hours. Thyroid function studies No results for input(s): TSH, T4TOTAL, T3FREE, THYROIDAB in the last 72 hours.  Invalid input(s): FREET3 Anemia work up  No results for input(s): VITAMINB12, FOLATE, FERRITIN, TIBC, IRON, RETICCTPCT in the last 72 hours. Urinalysis    Component Value Date/Time   COLORURINE YELLOW 06/13/2016 1813   APPEARANCEUR CLEAR 06/13/2016 1813   LABSPEC 1.013 06/13/2016 1813   PHURINE 5.0 06/13/2016 1813   GLUCOSEU NEGATIVE 06/13/2016 1813   HGBUR NEGATIVE 06/13/2016 1813   BILIRUBINUR NEGATIVE 06/13/2016  1813   KETONESUR NEGATIVE 06/13/2016 1813   PROTEINUR NEGATIVE 06/13/2016 1813   UROBILINOGEN 0.2 06/28/2014 0456   NITRITE NEGATIVE 06/13/2016 1813   LEUKOCYTESUR NEGATIVE 06/13/2016 1813   Sepsis Labs Invalid input(s): PROCALCITONIN,  WBC,  LACTICIDVEN Microbiology Recent Results (from the past 240 hour(s))  Blood culture (routine x 2)     Status: None (Preliminary result)   Collection Time: 06/13/16  4:51 PM  Result Value Ref Range Status   Specimen Description BLOOD LEFT ANTECUBITAL  Final   Special Requests BOTTLES DRAWN AEROBIC AND ANAEROBIC 5 CC EACH  Final   Culture   Final    NO GROWTH 3 DAYS Performed at [facility], [place] [address] [place], [place] [address]    Report Status PENDING  Incomplete  Blood culture (routine x 2)     Status: None (Preliminary result)   Collection Time: 06/13/16  4:51 PM  Result Value Ref Range Status   Specimen Description BLOOD RIGHT ANTECUBITAL  Final   Special Requests BOTTLES DRAWN AEROBIC AND ANAEROBIC 5CCEACH  Final   Culture   Final    NO GROWTH 3 DAYS Performed at [facility], [place] [address] [place], [place] [address]    Report Status PENDING  Incomplete   Time coordinating discharge: Over 30 minutes  SIGNED:  Kerney Elbe, DO [facility] 06/16/2016, 8:41 PM Pager [phone]  If 7PM-7AM, please contact night-coverage [email or web address] Password [email or web address]

## 2016-06-16 NOTE — Progress Notes (Signed)
SATURATION QUALIFICATIONS: (This note is used to comply with regulatory documentation for home oxygen)  Patient Saturations on Room Air at Rest = 85%  Patient Saturations on Room Air while Ambulating = 88%  Patient Saturations on 3 Liters of oxygen while Ambulating = 94%  Please briefly explain why patient needs home oxygen:

## 2016-06-16 NOTE — Care Management Note (Signed)
Case Management Note  Patient Details  Name: Cristina Middleton MRN: [id number] Date of Birth: [birth year]  Subjective/Objective:                  Influenza with respiratory manifestation Action/Plan: Discharge planning Expected Discharge Date:  06/16/16               Expected Discharge Plan:  [place]  In-House Referral:  NA  Discharge planning Services  CM Consult  Post Acute Care Choice:  Durable Medical Equipment, Home Health Choice offered to:  Patient, Adult Children (Daughter at bedside)  DME Arranged:  Oxygen DME Agency:  [facility]  HH Arranged:  RN, PT [place] Agency:  [facility]  Status of Service:  Completed, signed off  If discussed at [place] of Stay Meetings, dates discussed:    Additional Comments: Cm met with pt and daughter, Lindwood Qua [phone] in room to offer choice of home health agency. Family choose [facility] to render HHPT/RN.  Pt has CPAP through [facility] and wish to have home O2 through [facility]. CM called [facility] rep, Mandy with referral for O2 needs.  CM called [facility] rep, Kimberly with HHPT/RN referral.  No other CM needs were communicated. Dellie Catholic, RN 06/16/2016, 12:59 PM

## 2016-06-18 LAB — CULTURE, BLOOD (ROUTINE X 2)
CULTURE: NO GROWTH
Culture: NO GROWTH

## 2016-06-20 DIAGNOSIS — K5900 Constipation, unspecified: Secondary | ICD-10-CM | POA: Diagnosis not present

## 2016-06-20 DIAGNOSIS — K5790 Diverticulosis of intestine, part unspecified, without perforation or abscess without bleeding: Secondary | ICD-10-CM | POA: Diagnosis not present

## 2016-06-20 DIAGNOSIS — Z09 Encounter for follow-up examination after completed treatment for conditions other than malignant neoplasm: Secondary | ICD-10-CM | POA: Diagnosis not present

## 2016-06-20 DIAGNOSIS — F329 Major depressive disorder, single episode, unspecified: Secondary | ICD-10-CM | POA: Diagnosis not present

## 2016-06-20 DIAGNOSIS — I10 Essential (primary) hypertension: Secondary | ICD-10-CM | POA: Diagnosis not present

## 2016-06-21 DIAGNOSIS — I10 Essential (primary) hypertension: Secondary | ICD-10-CM | POA: Diagnosis not present

## 2016-06-21 DIAGNOSIS — K5791 Diverticulosis of intestine, part unspecified, without perforation or abscess with bleeding: Secondary | ICD-10-CM | POA: Diagnosis not present

## 2016-06-21 DIAGNOSIS — E039 Hypothyroidism, unspecified: Secondary | ICD-10-CM | POA: Diagnosis not present

## 2016-06-21 DIAGNOSIS — E119 Type 2 diabetes mellitus without complications: Secondary | ICD-10-CM | POA: Diagnosis not present

## 2016-06-21 DIAGNOSIS — M1990 Unspecified osteoarthritis, unspecified site: Secondary | ICD-10-CM | POA: Diagnosis not present

## 2016-06-21 DIAGNOSIS — K219 Gastro-esophageal reflux disease without esophagitis: Secondary | ICD-10-CM | POA: Diagnosis not present

## 2016-06-21 DIAGNOSIS — Z7984 Long term (current) use of oral hypoglycemic drugs: Secondary | ICD-10-CM | POA: Diagnosis not present

## 2016-06-21 DIAGNOSIS — F419 Anxiety disorder, unspecified: Secondary | ICD-10-CM | POA: Diagnosis not present

## 2016-06-21 DIAGNOSIS — E785 Hyperlipidemia, unspecified: Secondary | ICD-10-CM | POA: Diagnosis not present

## 2016-06-21 DIAGNOSIS — D62 Acute posthemorrhagic anemia: Secondary | ICD-10-CM | POA: Diagnosis not present

## 2016-06-21 DIAGNOSIS — J111 Influenza due to unidentified influenza virus with other respiratory manifestations: Secondary | ICD-10-CM | POA: Diagnosis not present

## 2016-06-21 DIAGNOSIS — J45909 Unspecified asthma, uncomplicated: Secondary | ICD-10-CM | POA: Diagnosis not present

## 2016-06-21 DIAGNOSIS — Z96651 Presence of right artificial knee joint: Secondary | ICD-10-CM | POA: Diagnosis not present

## 2016-06-23 ENCOUNTER — Telehealth: Payer: Self-pay | Admitting: *Deleted

## 2016-06-23 DIAGNOSIS — J45909 Unspecified asthma, uncomplicated: Secondary | ICD-10-CM | POA: Diagnosis not present

## 2016-06-23 DIAGNOSIS — K5791 Diverticulosis of intestine, part unspecified, without perforation or abscess with bleeding: Secondary | ICD-10-CM | POA: Diagnosis not present

## 2016-06-23 DIAGNOSIS — J111 Influenza due to unidentified influenza virus with other respiratory manifestations: Secondary | ICD-10-CM | POA: Diagnosis not present

## 2016-06-23 DIAGNOSIS — E119 Type 2 diabetes mellitus without complications: Secondary | ICD-10-CM | POA: Diagnosis not present

## 2016-06-23 DIAGNOSIS — D62 Acute posthemorrhagic anemia: Secondary | ICD-10-CM | POA: Diagnosis not present

## 2016-06-23 DIAGNOSIS — I10 Essential (primary) hypertension: Secondary | ICD-10-CM | POA: Diagnosis not present

## 2016-06-23 NOTE — Telephone Encounter (Signed)
Mrs. Cristina Middleton (Mrs. Cristina Middleton daughter and emergency contact) calling to update Dr. Kellie Simmering on her mother's status and has questions.  Mrs. Cristina Middleton states her mother had another bleeding episode from varix in left lateral leg on 06-13-2016. EMS responded and bleeding was stopped.  She was admitted to the hospital where she was diagnosed with pneumonia and influenza.  She has been discharged and is now at home. Mrs. Cristina Middleton is wondering if she can remove gauze dressing with compression wrap that has been on since 06-13-2016.  Advised her to carefully unwrap dressing and apply bandaide with Ace wrap over the area that had bled.  Gave instructions on how to stop bleeding if Mrs. Cristina Middleton should experience another bleeding episode.  Mrs. Cristina Middleton is scheduled for endovenous laser ablation of left greater saphenous vein by Dr. Tinnie Gens and sclerotherapy of bleeding varicosity by Thea Silversmith RN on 07-11-2016.  Mrs. Cristina Middleton verbalized understanding and will call if she has further questions or concerns.

## 2016-06-29 DIAGNOSIS — K5791 Diverticulosis of intestine, part unspecified, without perforation or abscess with bleeding: Secondary | ICD-10-CM | POA: Diagnosis not present

## 2016-06-29 DIAGNOSIS — I10 Essential (primary) hypertension: Secondary | ICD-10-CM | POA: Diagnosis not present

## 2016-06-29 DIAGNOSIS — E119 Type 2 diabetes mellitus without complications: Secondary | ICD-10-CM | POA: Diagnosis not present

## 2016-06-29 DIAGNOSIS — D62 Acute posthemorrhagic anemia: Secondary | ICD-10-CM | POA: Diagnosis not present

## 2016-06-29 DIAGNOSIS — J111 Influenza due to unidentified influenza virus with other respiratory manifestations: Secondary | ICD-10-CM | POA: Diagnosis not present

## 2016-06-29 DIAGNOSIS — J45909 Unspecified asthma, uncomplicated: Secondary | ICD-10-CM | POA: Diagnosis not present

## 2016-06-30 DIAGNOSIS — D649 Anemia, unspecified: Secondary | ICD-10-CM | POA: Diagnosis not present

## 2016-06-30 DIAGNOSIS — E039 Hypothyroidism, unspecified: Secondary | ICD-10-CM | POA: Diagnosis not present

## 2016-06-30 DIAGNOSIS — Z79899 Other long term (current) drug therapy: Secondary | ICD-10-CM | POA: Diagnosis not present

## 2016-06-30 DIAGNOSIS — E118 Type 2 diabetes mellitus with unspecified complications: Secondary | ICD-10-CM | POA: Diagnosis not present

## 2016-06-30 DIAGNOSIS — R4182 Altered mental status, unspecified: Secondary | ICD-10-CM | POA: Diagnosis not present

## 2016-06-30 DIAGNOSIS — I10 Essential (primary) hypertension: Secondary | ICD-10-CM | POA: Diagnosis not present

## 2016-07-01 ENCOUNTER — Encounter: Payer: Self-pay | Admitting: Vascular Surgery

## 2016-07-01 DIAGNOSIS — E119 Type 2 diabetes mellitus without complications: Secondary | ICD-10-CM | POA: Diagnosis not present

## 2016-07-01 DIAGNOSIS — K5791 Diverticulosis of intestine, part unspecified, without perforation or abscess with bleeding: Secondary | ICD-10-CM | POA: Diagnosis not present

## 2016-07-01 DIAGNOSIS — D62 Acute posthemorrhagic anemia: Secondary | ICD-10-CM | POA: Diagnosis not present

## 2016-07-01 DIAGNOSIS — J111 Influenza due to unidentified influenza virus with other respiratory manifestations: Secondary | ICD-10-CM | POA: Diagnosis not present

## 2016-07-01 DIAGNOSIS — J45909 Unspecified asthma, uncomplicated: Secondary | ICD-10-CM | POA: Diagnosis not present

## 2016-07-01 DIAGNOSIS — I10 Essential (primary) hypertension: Secondary | ICD-10-CM | POA: Diagnosis not present

## 2016-07-06 DIAGNOSIS — J111 Influenza due to unidentified influenza virus with other respiratory manifestations: Secondary | ICD-10-CM | POA: Diagnosis not present

## 2016-07-06 DIAGNOSIS — D509 Iron deficiency anemia, unspecified: Secondary | ICD-10-CM | POA: Diagnosis not present

## 2016-07-06 DIAGNOSIS — E119 Type 2 diabetes mellitus without complications: Secondary | ICD-10-CM | POA: Diagnosis not present

## 2016-07-06 DIAGNOSIS — D62 Acute posthemorrhagic anemia: Secondary | ICD-10-CM | POA: Diagnosis not present

## 2016-07-06 DIAGNOSIS — Z0000 Encounter for general adult medical examination without abnormal findings: Secondary | ICD-10-CM | POA: Diagnosis not present

## 2016-07-06 DIAGNOSIS — K5791 Diverticulosis of intestine, part unspecified, without perforation or abscess with bleeding: Secondary | ICD-10-CM | POA: Diagnosis not present

## 2016-07-06 DIAGNOSIS — J45909 Unspecified asthma, uncomplicated: Secondary | ICD-10-CM | POA: Diagnosis not present

## 2016-07-06 DIAGNOSIS — I10 Essential (primary) hypertension: Secondary | ICD-10-CM | POA: Diagnosis not present

## 2016-07-08 ENCOUNTER — Encounter: Payer: Self-pay | Admitting: Vascular Surgery

## 2016-07-11 ENCOUNTER — Encounter: Payer: Self-pay | Admitting: Vascular Surgery

## 2016-07-11 ENCOUNTER — Ambulatory Visit (INDEPENDENT_AMBULATORY_CARE_PROVIDER_SITE_OTHER): Payer: Medicare Other | Admitting: Vascular Surgery

## 2016-07-11 VITALS — BP 127/62 | HR 72 | Temp 97.3°F | Resp 18 | Ht 63.0 in | Wt 140.0 lb

## 2016-07-11 DIAGNOSIS — I83892 Varicose veins of left lower extremities with other complications: Secondary | ICD-10-CM

## 2016-07-11 HISTORY — PX: ENDOVENOUS ABLATION SAPHENOUS VEIN W/ LASER: SUR449

## 2016-07-11 NOTE — Progress Notes (Signed)
Subjective:     Patient ID: Cristina Middleton, female   DOB: 09-01-1920, 83 y.o.   MRN: [id number]  HPI This 83 year old female had laser ablation of the left great saphenous vein from the proximal calf to near the saphenofemoral junction performed under local tumescent anesthesia. A total of approximately 2000 J of energy was utilized. She tolerated the procedure well. She also had foam sclerotherapy performed by Thea Silversmith at the previous bleeding site in the left lower leg. She tolerated both procedures well.  Review of Systems     Objective:   Physical Exam BP 127/62 (BP Location: Left Arm, Patient Position: Sitting, Cuff Size: Normal)   Pulse 72   Temp 97.3 F (36.3 C) (Oral)   Resp 18   Ht 5\' 3"  (1.6 m)   Wt 140 lb (63.5 kg)   SpO2 99%   BMI 24.80 kg/m        Assessment:     Well-tolerated laser ablation left great saphenous vein performed under local tumescent anesthesia plus foam sclerotherapy of bleeding site    Plan:     Return in 1 week for venous duplex exam to confirm closure left great saphenous vein

## 2016-07-11 NOTE — Progress Notes (Signed)
Laser Ablation Procedure    Date: 07/11/2016   Cristina Middleton [facility] DOB:August 22, 1920  Consent signed: Yes    Surgeon:  Dr. Nelda Severe. Kellie Simmering  Procedure: Laser Ablation: left Greater Saphenous Vein  BP 127/62 (BP Location: Left Arm, Patient Position: Sitting, Cuff Size: Normal)   Pulse 72   Temp 97.3 F (36.3 C) (Oral)   Resp 18   Ht 5\' 3"  (1.6 m)   Wt 140 lb (63.5 kg)   SpO2 99%   BMI 24.80 kg/m   Tumescent Anesthesia: 425 cc 0.9% NaCl with 50 cc Lidocaine HCL with 1% Epi and 15 cc 8.4% NaHCO3  Local Anesthesia: 4 cc Lidocaine HCL and NaHCO3 (ratio 2:1)  Pulsed Mode: 15 watts, 544ms delay, 1.0 duration  Total Energy: 2012 Joules             Total Pulses: 137               Total Time: 2:15  Sclerotherapy: 0.3  %Sotradecol. Patient received a total of 6 cc    Patient tolerated procedure well  Notes: sclerotherapy performed by Thea Silversmith RN  Description of Procedure:  After marking the course of the secondary varicosities, the patient was placed on the operating table in the supine position, and the left leg was prepped and draped in sterile fashion.   Local anesthetic was administered and under ultrasound guidance the saphenous vein was accessed with a micro needle and guide wire; then the mirco puncture sheath was placed.  A guide wire was inserted saphenofemoral junction , followed by a 5 french sheath.  The position of the sheath and then the laser fiber below the junction was confirmed using the ultrasound.  Tumescent anesthesia was administered along the course of the saphenous vein using ultrasound guidance. The patient was placed in Trendelenburg position and protective laser glasses were placed on patient and staff, and the laser was fired at 15 watts continuous mode advancing 1-86mm/second for a total of 2012 joules.     Sclerotherapy was performed to her bleeding varicosities using 6  cc .3% Sotradecol foam via a 27g butterfly needle.  Steri strips were applied to the stab  wounds and ABD pads and thigh high compression stockings were applied.  Ace wrap bandages were applied over the phlebectomy sites and at the top of the saphenofemoral junction. Blood loss was less than 15 cc.  The patient ambulated out of the operating room having tolerated the procedure well.

## 2016-07-12 ENCOUNTER — Encounter: Payer: Self-pay | Admitting: Vascular Surgery

## 2016-07-12 NOTE — Progress Notes (Signed)
X=.3% Sotradecol administered with a 27g butterfly.  Patient received a total of 6cc. Treated area of bleeding with 6 cc post her laser ablation proced. Tol well. Follow prn.    Compression stockings applied: Yes.

## 2016-07-14 DIAGNOSIS — K5791 Diverticulosis of intestine, part unspecified, without perforation or abscess with bleeding: Secondary | ICD-10-CM | POA: Diagnosis not present

## 2016-07-14 DIAGNOSIS — E119 Type 2 diabetes mellitus without complications: Secondary | ICD-10-CM | POA: Diagnosis not present

## 2016-07-14 DIAGNOSIS — J111 Influenza due to unidentified influenza virus with other respiratory manifestations: Secondary | ICD-10-CM | POA: Diagnosis not present

## 2016-07-14 DIAGNOSIS — J45909 Unspecified asthma, uncomplicated: Secondary | ICD-10-CM | POA: Diagnosis not present

## 2016-07-14 DIAGNOSIS — D62 Acute posthemorrhagic anemia: Secondary | ICD-10-CM | POA: Diagnosis not present

## 2016-07-14 DIAGNOSIS — I10 Essential (primary) hypertension: Secondary | ICD-10-CM | POA: Diagnosis not present

## 2016-07-18 ENCOUNTER — Ambulatory Visit (INDEPENDENT_AMBULATORY_CARE_PROVIDER_SITE_OTHER): Payer: Medicare Other | Admitting: Vascular Surgery

## 2016-07-18 ENCOUNTER — Encounter: Payer: Self-pay | Admitting: Vascular Surgery

## 2016-07-18 ENCOUNTER — Ambulatory Visit (HOSPITAL_COMMUNITY)
Admission: RE | Admit: 2016-07-18 | Discharge: 2016-07-18 | Disposition: A | Discharge status: Home / Self Care | Payer: Medicare Other | Source: Ambulatory Visit | Attending: Vascular Surgery | Admitting: Vascular Surgery

## 2016-07-18 VITALS — BP 152/77 | HR 79 | Temp 98.4°F | Resp 16 | Ht 63.0 in | Wt 140.0 lb

## 2016-07-18 DIAGNOSIS — I83892 Varicose veins of left lower extremities with other complications: Secondary | ICD-10-CM

## 2016-07-18 DIAGNOSIS — Z9889 Other specified postprocedural states: Secondary | ICD-10-CM | POA: Diagnosis not present

## 2016-07-18 DIAGNOSIS — I83812 Varicose veins of left lower extremities with pain: Secondary | ICD-10-CM

## 2016-07-18 NOTE — Progress Notes (Signed)
Subjective:     Patient ID: Cristina Middleton, female   DOB: 1920/10/27, 83 y.o.   MRN: [id number]  HPI This 83 year old female returns 1 week post-laser ablation left great saphenous vein and foam sclerotherapy of the bleeding site performed for gross reflux with previous episode of spontaneous bleeding from distal varicose vein. He has had minimal discomfort. She has worn her elastic compression stocking as instructed. She has no specific complaints. Past Medical History:  Diagnosis Date  . Anxiety   . Arthritis   . Diabetes mellitus without complication ([place])   . Dislocated shoulder    WAS POPPED BACK IN PLACE  2006???  . GERD (gastroesophageal reflux disease)    TAKES  ACIPHEX  . Hypertension   . Hyperthyroidism    NOT SURE WHICH ONE  . PONV (postoperative nausea and vomiting)    YRS AGO.....(6-7 YRS)  . Sleep apnea    WEARS MASK EVERY NOW AND THEN    Social History  Substance Use Topics  . Smoking status: Former Smoker    Packs/day: 1.00    Types: Cigarettes    Quit date: 05/02/1998  . Smokeless tobacco: Never Used  . Alcohol use No    Family History  Problem Relation Age of Onset  . Other Mother   . Other Father   . Breast cancer Sister   . Colon cancer Sister   . CAD Sister   . Marfan syndrome Sister   . CAD Brother   . Marfan syndrome Brother     Allergies  Allergen Reactions  . Other Other (See Comments)    Pt is allergic to Alka-Seltzer tablets.   Reaction:  Hallucinations   . Penicillins Hives, Itching, Swelling and Other (See Comments)    Reaction:  Unspecified swelling reaction Has patient had a PCN reaction causing immediate rash, facial/tongue/throat swelling, SOB or lightheadedness with hypotension: Yes Has patient had a PCN reaction causing severe rash involving mucus membranes or skin necrosis: No Has patient had a PCN reaction that required hospitalization No Has patient had a PCN reaction occurring within the last 10 years: No If all of the above  answers are "NO", then may proceed with Cephalosporin use.  . Sulfa Antibiotics Hives, Itching, Swelling and Other (See Comments)    Reaction:  Unspecified swelling reaction     Current Outpatient Prescriptions:  .  acetaminophen (TYLENOL) 325 MG tablet, Take 325 mg by mouth every 6 (six) hours as needed for mild pain, moderate pain, fever or headache. , Disp: , Rfl:  .  ALPRAZolam (XANAX) 0.25 MG tablet, Take 0.25 mg by mouth at bedtime. , Disp: , Rfl:  .  aspirin EC 81 MG tablet, Take 81 mg by mouth at bedtime. , Disp: , Rfl:  .  calcium carbonate (OSCAL) 1500 (600 Ca) MG TABS tablet, Take 600 mg of elemental calcium by mouth daily., Disp: , Rfl:  .  CINNAMON PO, Take 1 tablet by mouth daily. , Disp: , Rfl:  .  dextromethorphan-guaiFENesin (MUCINEX DM) 30-600 MG 12hr tablet, Take 1 tablet by mouth 2 (two) times daily., Disp: 10 tablet, Rfl: 0 .  diphenhydramine-acetaminophen (TYLENOL PM) 25-500 MG TABS tablet, Take 1 tablet by mouth at bedtime as needed (for sleep/pain)., Disp: , Rfl:  .  escitalopram (LEXAPRO) 20 MG tablet, Take 20 mg by mouth daily., Disp: , Rfl:  .  indapamide (LOZOL) 2.5 MG tablet, Take 2.5 mg by mouth 3 (three) times a week. Pt takes on Sunday, Wednesday, and Friday.,  Disp: , Rfl:  .  L-Methylfolate-B12-B6-B2 (CEREFOLIN PO), Take 1 tablet by mouth daily., Disp: , Rfl:  .  levothyroxine (SYNTHROID, LEVOTHROID) 100 MCG tablet, Take 100 mcg by mouth daily before breakfast., Disp: , Rfl:  .  loratadine (CLARITIN) 10 MG tablet, Take 10 mg by mouth daily as needed for allergies. , Disp: , Rfl:  .  metFORMIN (GLUCOPHAGE) 500 MG tablet, Take 250 mg by mouth daily after breakfast. , Disp: , Rfl:  .  Multiple Vitamins-Minerals (PRESERVISION AREDS 2) CAPS, Take 1 capsule by mouth daily., Disp: , Rfl:  .  oseltamivir (TAMIFLU) 30 MG capsule, Take 1 capsule (30 mg total) by mouth 2 (two) times daily., Disp: 4 capsule, Rfl: 0 .  polyethylene glycol (MIRALAX / GLYCOLAX) packet, Take 17  g by mouth daily as needed for moderate constipation., Disp: , Rfl:  .  RABEprazole (ACIPHEX) 20 MG tablet, Take 20 mg by mouth 2 (two) times daily. , Disp: , Rfl:  .  simvastatin (ZOCOR) 20 MG tablet, Take 20 mg by mouth at bedtime. , Disp: , Rfl:  .  vitamin B-12 (CYANOCOBALAMIN) 500 MCG tablet, Take 500 mcg by mouth daily., Disp: , Rfl:   Vitals:   07/18/16 1426  BP: (!) 152/77  Pulse: 79  Resp: 16  Temp: 98.4 F (36.9 C)  TempSrc: Oral  SpO2: 95%  Weight: 140 lb (63.5 kg)  Height: 5\' 3"  (1.6 m)    Body mass index is 24.8 kg/m.         Review of Systems As chest pain, dyspnea on exertion, PND, orthopnea, hemoptysis, claudication    Objective:   Physical Exam BP (!) 152/77 (BP Location: Left Arm, Patient Position: Sitting, Cuff Size: Normal)   Pulse 79   Temp 98.4 F (36.9 C) (Oral)   Resp 16   Ht 5\' 3"  (1.6 m)   Wt 140 lb (63.5 kg)   SpO2 95%   BMI 24.80 kg/m   Elderly female no apparent distress alert and oriented 3 Lungs no rhonchi or wheezing Cardiovascular regular rhythm no murmurs Left leg with mild discomfort to deep palpation over great saphenous vein and thigh. No distal edema noted. Dry dressing over previous bleeding site lateral ankle. 3+ dorsalis pedis pulse palpable.  Today I ordered a venous duplex exam the left leg which I reviewed and interpreted. There is no DVT. There is total closure of the great saphenous vein the left from the proximal calf to near the saphenofemoral junction     Assessment:     Successful laser ablation left great saphenous vein plus foam sclerotherapy of bleeding site    Plan:     Return to see me on a when necessary basis

## 2016-08-02 DIAGNOSIS — H1859 Other hereditary corneal dystrophies: Secondary | ICD-10-CM | POA: Diagnosis not present

## 2016-08-02 DIAGNOSIS — H353132 Nonexudative age-related macular degeneration, bilateral, intermediate dry stage: Secondary | ICD-10-CM | POA: Diagnosis not present

## 2016-08-02 DIAGNOSIS — H02834 Dermatochalasis of left upper eyelid: Secondary | ICD-10-CM | POA: Diagnosis not present

## 2016-08-02 DIAGNOSIS — E119 Type 2 diabetes mellitus without complications: Secondary | ICD-10-CM | POA: Diagnosis not present

## 2016-08-02 DIAGNOSIS — H10023 Other mucopurulent conjunctivitis, bilateral: Secondary | ICD-10-CM | POA: Diagnosis not present

## 2016-08-02 DIAGNOSIS — H02831 Dermatochalasis of right upper eyelid: Secondary | ICD-10-CM | POA: Diagnosis not present

## 2016-08-02 DIAGNOSIS — H40013 Open angle with borderline findings, low risk, bilateral: Secondary | ICD-10-CM | POA: Diagnosis not present

## 2016-08-02 DIAGNOSIS — Z961 Presence of intraocular lens: Secondary | ICD-10-CM | POA: Diagnosis not present

## 2016-08-02 DIAGNOSIS — H43811 Vitreous degeneration, right eye: Secondary | ICD-10-CM | POA: Diagnosis not present

## 2016-08-22 DIAGNOSIS — Z803 Family history of malignant neoplasm of breast: Secondary | ICD-10-CM | POA: Diagnosis not present

## 2016-08-22 DIAGNOSIS — Z1231 Encounter for screening mammogram for malignant neoplasm of breast: Secondary | ICD-10-CM | POA: Diagnosis not present

## 2016-09-05 DIAGNOSIS — D509 Iron deficiency anemia, unspecified: Secondary | ICD-10-CM | POA: Diagnosis not present

## 2016-09-12 DIAGNOSIS — D509 Iron deficiency anemia, unspecified: Secondary | ICD-10-CM | POA: Diagnosis not present

## 2016-09-12 DIAGNOSIS — I10 Essential (primary) hypertension: Secondary | ICD-10-CM | POA: Diagnosis not present

## 2016-09-12 DIAGNOSIS — M25572 Pain in left ankle and joints of left foot: Secondary | ICD-10-CM | POA: Diagnosis not present

## 2016-09-12 DIAGNOSIS — E039 Hypothyroidism, unspecified: Secondary | ICD-10-CM | POA: Diagnosis not present

## 2016-09-14 ENCOUNTER — Telehealth: Payer: Self-pay | Admitting: *Deleted

## 2016-09-14 NOTE — Telephone Encounter (Signed)
Returning Cristina Middleton's earlier voice message regarding her mother, Cristina Middleton.  Cristina Middleton is calling on behalf of her 83 year old mother.  Cristina Middleton is s/p endovenous laser ablation 07-11-2016 Left greater saphenous vein by Tinnie Gens MD.  Cristina Middleton states Cristina Middleton noticed left ankle swelling "over the weekend" (09-10-2016---09-11-2016). Cristina Middleton said her mother said she could wear her shoes but left shoe was tight.  Denies pain in left foot/ankle. Cristina Middleton said her mother may have "stumbled" in the house but she was unaware of a fall.  Cristina Middleton said today the swelling has lessened/improved in Cristina Middleton's left ankle.  Cristina Middleton states her mother was seen by her primary care physician on Monday and that an X-ray was done that revealed no fractures.  Advised Cristina Middleton to have her mother elevate her left leg and wear compression hose.  Advised Cristina Middleton to call [facility] or go to ER if swelling of left foot/ankle worsens. Cristina Middleton verbalized understanding.

## 2016-10-12 DIAGNOSIS — L853 Xerosis cutis: Secondary | ICD-10-CM | POA: Diagnosis not present

## 2016-10-12 DIAGNOSIS — L989 Disorder of the skin and subcutaneous tissue, unspecified: Secondary | ICD-10-CM | POA: Diagnosis not present

## 2016-10-12 DIAGNOSIS — L0390 Cellulitis, unspecified: Secondary | ICD-10-CM | POA: Diagnosis not present

## 2016-10-14 DIAGNOSIS — L308 Other specified dermatitis: Secondary | ICD-10-CM | POA: Diagnosis not present

## 2016-10-14 DIAGNOSIS — I872 Venous insufficiency (chronic) (peripheral): Secondary | ICD-10-CM | POA: Diagnosis not present

## 2016-12-07 DIAGNOSIS — I10 Essential (primary) hypertension: Secondary | ICD-10-CM | POA: Diagnosis not present

## 2016-12-07 DIAGNOSIS — D509 Iron deficiency anemia, unspecified: Secondary | ICD-10-CM | POA: Diagnosis not present

## 2016-12-08 ENCOUNTER — Inpatient Hospital Stay (HOSPITAL_COMMUNITY)
Admission: EM | Admit: 2016-12-08 | Discharge: 2016-12-10 | DRG: 440 | Disposition: A | Discharge status: Home / Self Care | Payer: Medicare Other | Attending: Internal Medicine | Admitting: Internal Medicine

## 2016-12-08 ENCOUNTER — Encounter (HOSPITAL_COMMUNITY): Payer: Self-pay | Admitting: Emergency Medicine

## 2016-12-08 ENCOUNTER — Emergency Department (HOSPITAL_COMMUNITY): Payer: Medicare Other

## 2016-12-08 DIAGNOSIS — E039 Hypothyroidism, unspecified: Secondary | ICD-10-CM | POA: Diagnosis present

## 2016-12-08 DIAGNOSIS — F419 Anxiety disorder, unspecified: Secondary | ICD-10-CM | POA: Diagnosis present

## 2016-12-08 DIAGNOSIS — E119 Type 2 diabetes mellitus without complications: Secondary | ICD-10-CM

## 2016-12-08 DIAGNOSIS — G4730 Sleep apnea, unspecified: Secondary | ICD-10-CM | POA: Diagnosis present

## 2016-12-08 DIAGNOSIS — Z96651 Presence of right artificial knee joint: Secondary | ICD-10-CM | POA: Diagnosis present

## 2016-12-08 DIAGNOSIS — Z7982 Long term (current) use of aspirin: Secondary | ICD-10-CM

## 2016-12-08 DIAGNOSIS — M1990 Unspecified osteoarthritis, unspecified site: Secondary | ICD-10-CM | POA: Diagnosis present

## 2016-12-08 DIAGNOSIS — Z87891 Personal history of nicotine dependence: Secondary | ICD-10-CM | POA: Diagnosis not present

## 2016-12-08 DIAGNOSIS — Z800 Family history of malignant neoplasm of digestive organs: Secondary | ICD-10-CM | POA: Diagnosis not present

## 2016-12-08 DIAGNOSIS — I10 Essential (primary) hypertension: Secondary | ICD-10-CM | POA: Diagnosis present

## 2016-12-08 DIAGNOSIS — Z803 Family history of malignant neoplasm of breast: Secondary | ICD-10-CM

## 2016-12-08 DIAGNOSIS — E785 Hyperlipidemia, unspecified: Secondary | ICD-10-CM | POA: Diagnosis present

## 2016-12-08 DIAGNOSIS — K5730 Diverticulosis of large intestine without perforation or abscess without bleeding: Secondary | ICD-10-CM | POA: Diagnosis not present

## 2016-12-08 DIAGNOSIS — K859 Acute pancreatitis without necrosis or infection, unspecified: Principal | ICD-10-CM | POA: Diagnosis present

## 2016-12-08 DIAGNOSIS — F329 Major depressive disorder, single episode, unspecified: Secondary | ICD-10-CM | POA: Diagnosis present

## 2016-12-08 DIAGNOSIS — Z7984 Long term (current) use of oral hypoglycemic drugs: Secondary | ICD-10-CM | POA: Diagnosis not present

## 2016-12-08 DIAGNOSIS — Z8279 Family history of other congenital malformations, deformations and chromosomal abnormalities: Secondary | ICD-10-CM | POA: Diagnosis not present

## 2016-12-08 DIAGNOSIS — Z880 Allergy status to penicillin: Secondary | ICD-10-CM

## 2016-12-08 DIAGNOSIS — R1013 Epigastric pain: Secondary | ICD-10-CM | POA: Diagnosis not present

## 2016-12-08 DIAGNOSIS — G4733 Obstructive sleep apnea (adult) (pediatric): Secondary | ICD-10-CM | POA: Diagnosis present

## 2016-12-08 DIAGNOSIS — K449 Diaphragmatic hernia without obstruction or gangrene: Secondary | ICD-10-CM | POA: Diagnosis present

## 2016-12-08 DIAGNOSIS — K219 Gastro-esophageal reflux disease without esophagitis: Secondary | ICD-10-CM | POA: Diagnosis present

## 2016-12-08 DIAGNOSIS — Z8249 Family history of ischemic heart disease and other diseases of the circulatory system: Secondary | ICD-10-CM

## 2016-12-08 DIAGNOSIS — Z882 Allergy status to sulfonamides status: Secondary | ICD-10-CM | POA: Diagnosis not present

## 2016-12-08 DIAGNOSIS — R109 Unspecified abdominal pain: Secondary | ICD-10-CM | POA: Diagnosis not present

## 2016-12-08 HISTORY — DX: Hyperlipidemia, unspecified: E78.5

## 2016-12-08 LAB — COMPREHENSIVE METABOLIC PANEL
ALK PHOS: 79 U/L (ref 38–126)
ALT: 13 U/L — AB (ref 14–54)
ANION GAP: 10 (ref 5–15)
AST: 21 U/L (ref 15–41)
Albumin: 3.9 g/dL (ref 3.5–5.0)
BILIRUBIN TOTAL: 0.6 mg/dL (ref 0.3–1.2)
BUN: 16 mg/dL (ref 6–20)
CO2: 26 mmol/L (ref 22–32)
Calcium: 9.4 mg/dL (ref 8.9–10.3)
Chloride: 101 mmol/L (ref 101–111)
Creatinine, Ser: 0.82 mg/dL (ref 0.44–1.00)
GFR calc non Af Amer: 60 mL/min — ABNORMAL LOW (ref >60)
GLUCOSE: 196 mg/dL — AB (ref 65–99)
POTASSIUM: 3.5 mmol/L (ref 3.5–5.1)
SODIUM: 137 mmol/L (ref 135–145)
TOTAL PROTEIN: 5.9 g/dL — AB (ref 6.5–8.1)

## 2016-12-08 LAB — LIPASE, BLOOD: LIPASE: 213 U/L — AB (ref 11–51)

## 2016-12-08 LAB — CBC
HCT: 36.7 % (ref 36.0–46.0)
HEMOGLOBIN: 12.4 g/dL (ref 12.0–15.0)
MCH: 33.1 pg (ref 26.0–34.0)
MCHC: 33.8 g/dL (ref 30.0–36.0)
MCV: 97.9 fL (ref 78.0–100.0)
PLATELETS: 166 10*3/uL (ref 150–400)
RBC: 3.75 MIL/uL — ABNORMAL LOW (ref 3.87–5.11)
RDW: 15.8 % — ABNORMAL HIGH (ref 11.5–15.5)
WBC: 6.3 10*3/uL (ref 4.0–10.5)

## 2016-12-08 LAB — I-STAT TROPONIN, ED: TROPONIN I, POC: 0.01 ng/mL (ref 0.00–0.08)

## 2016-12-08 MED ORDER — IOPAMIDOL (ISOVUE-300) INJECTION 61%
INTRAVENOUS | Status: AC
  Administered 2016-12-09: 100 mL
  Filled 2016-12-08: qty 100

## 2016-12-08 NOTE — H&P (Signed)
History and Physical    Cristina Middleton UEA:[id number] DOB: 1920/09/24 DOA: 12/08/2016  PCP: Jani Gravel, MD   Patient coming from: Home.  I have personally briefly reviewed patient's old medical records in [place]  Chief Complaint: Abdominal pain.  HPI: Cristina Middleton is a 83 y.o. female with medical history significant of anxiety, osteoarthritis, type 2 diabetes, dislocated shoulder, GERD, hyperlipidemia, hypertension, hypothyroidism, sleep apnea on CPAP, hiatal hernia who is coming to the emergency department with complaints of severe epigastric cramping pain radiated to her back after she ate some past that around 1800. She mentions that she took a Gas X`tablet before coming to the emergency department and is pain free. She denies having this intense pain before. No history of gallstones, alcohol consumption. However she uses indapamide 3 times a week for hypertension and has a history hyperlipidemia. She had mild nausea, but no vomiting. She denies fever, chills, sore throat, productive cough, chest pain, palpitations, dizziness, diaphoresis, diarrhea, constipation, melena or hematochezia. She denies dysuria, frequency, hematuria, polyuria, polydipsia or blurred vision.  ED Course: Initial vital signs in the emergency department temperature 90.31F, pulse 83, blood pressure 176/77, respirations 20 and O2 sat 94% on room air. WBC was 6.3, hemoglobin 12.4 g/dL and platelets 166. Troponin level was normal, EKG was sinus rhythm. Her CMP was normal, except for a glucose of 196 mg/dL and a total protein of 5.9 g/dL. Lipase level was 213.  Imaging: CT scan abdomen/pelvis with contrast showed a large hiatal hernia again, including most of the stomach with surrounding atelectasis. Mildly prominent gastroparesis. Small bilateral renal cysts. Diffuse diverticulosis. Diffuse aortic atherosclerosis and DJD along the lumbar spine. Please see images and radiology report for further detail.  Review of  Systems: As per HPI otherwise 10 point review of systems negative.    Past Medical History:  Diagnosis Date  . Anxiety   . Arthritis   . Diabetes mellitus without complication ([place])   . Dislocated shoulder    WAS POPPED BACK IN PLACE  2006???  . GERD (gastroesophageal reflux disease)    TAKES  ACIPHEX  . Hyperlipidemia   . Hypertension   . Hyperthyroidism    NOT SURE WHICH ONE  . PONV (postoperative nausea and vomiting)    YRS AGO.....(6-7 YRS)  . Sleep apnea    WEARS MASK EVERY NOW AND THEN    Past Surgical History:  Procedure Laterality Date  . BREAST SURGERY     CYST REMOVED -- LEFT  . ENDOVENOUS ABLATION SAPHENOUS VEIN W/ LASER Left 07/11/2016   endovenous laser ablation left greater saphenous vein by Tinnie Gens MD  . [place]  . LIPOMA EXCISION     FROM LEFT BACK  . TONSILLECTOMY    . TOTAL KNEE ARTHROPLASTY  05/25/2012   right knee  . TOTAL KNEE ARTHROPLASTY  05/25/2012   Procedure: TOTAL KNEE ARTHROPLASTY;  Surgeon: Yvette Rack., MD;  Location: [facility];  Service: Orthopedics;  Laterality: Right;     reports that she quit smoking about 18 years ago. Her smoking use included Cigarettes. She smoked 1.00 pack per day. She has never used smokeless tobacco. She reports that she does not drink alcohol or use drugs.  Allergies  Allergen Reactions  . Other Other (See Comments)    Pt is allergic to Alka-Seltzer tablets.   Reaction:  Hallucinations   . Penicillins Hives, Itching, Swelling and Other (See Comments)    Reaction:  Unspecified  swelling reaction Has patient had a PCN reaction causing immediate rash, facial/tongue/throat swelling, SOB or lightheadedness with hypotension: Yes Has patient had a PCN reaction causing severe rash involving mucus membranes or skin necrosis: No Has patient had a PCN reaction that required hospitalization No Has patient had a PCN reaction occurring within the last 10 years: No If all of the above answers are "NO",  then may proceed with Cephalosporin use.  . Sulfa Antibiotics Hives, Itching, Swelling and Other (See Comments)    Reaction:  Unspecified swelling reaction    Family History  Problem Relation Age of Onset  . Other Mother   . Other Father   . Breast cancer Sister   . Colon cancer Sister   . CAD Sister   . Marfan syndrome Sister   . CAD Brother   . Marfan syndrome Brother     Prior to Admission medications   Medication Sig Start Date End Date Taking? Authorizing Provider  acetaminophen (TYLENOL) 325 MG tablet Take 325 mg by mouth every 6 (six) hours as needed for mild pain, moderate pain, fever or headache.    Yes [provider]  ALPRAZolam (XANAX) 0.25 MG tablet Take 0.25 mg by mouth at bedtime.    Yes [provider]  aspirin EC 81 MG tablet Take 81 mg by mouth at bedtime.    Yes [provider]  calcium carbonate (OSCAL) 1500 (600 Ca) MG TABS tablet Take 600 mg of elemental calcium by mouth daily.   Yes [provider]  CINNAMON PO Take 1 tablet by mouth daily.    Yes [provider]  diphenhydramine-acetaminophen (TYLENOL PM) 25-500 MG TABS tablet Take 1 tablet by mouth at bedtime as needed (for sleep/pain).   Yes [provider]  escitalopram (LEXAPRO) 20 MG tablet Take 20 mg by mouth daily.   Yes [provider]  indapamide (LOZOL) 2.5 MG tablet Take 2.5 mg by mouth 3 (three) times a week. Pt takes on Sunday, Wednesday, and Friday.   Yes [provider]  L-Methylfolate-B12-B6-B2 (CEREFOLIN PO) Take 1 tablet by mouth daily.   Yes [provider]  levothyroxine (SYNTHROID, LEVOTHROID) 100 MCG tablet Take 100 mcg by mouth daily before breakfast.   Yes [provider]  loratadine (CLARITIN) 10 MG tablet Take 10 mg by mouth daily as needed for allergies.    Yes [provider]  metFORMIN (GLUCOPHAGE) 500 MG tablet Take 250 mg by mouth daily after breakfast.    Yes [provider]    Multiple Vitamins-Minerals (PRESERVISION AREDS 2) CAPS Take 1 capsule by mouth daily.   Yes [provider]  polyethylene glycol (MIRALAX / GLYCOLAX) packet Take 17 g by mouth daily as needed for moderate constipation.   Yes [provider]  RABEprazole (ACIPHEX) 20 MG tablet Take 20 mg by mouth 2 (two) times daily.    Yes [provider]  simvastatin (ZOCOR) 20 MG tablet Take 20 mg by mouth at bedtime.    Yes [provider]  vitamin B-12 (CYANOCOBALAMIN) 500 MCG tablet Take 500 mcg by mouth daily.   Yes [provider]  dextromethorphan-guaiFENesin (MUCINEX DM) 30-600 MG 12hr tablet Take 1 tablet by mouth 2 (two) times daily. Patient not taking: Reported on 12/08/2016 06/16/16   Raiford Noble Stilwell, DO    Physical Exam: Vitals:   12/08/16 2245 12/08/16 2300 12/08/16 2315 12/08/16 2330  BP: 130/72 (!) 141/70 135/68 (!) 151/72  Pulse: 84 82 79 83  Resp: Marland Kitchen)  27 (!) 21 14 (!) 26  Temp:      TempSrc:      SpO2: 91% 90% 94% 91%  Weight:      Height:        Constitutional: NAD, calm, comfortable Eyes: PERRL, lids and conjunctivae normal ENMT: Mucous membranes are moist. Posterior pharynx clear of any exudate or lesions. Neck: normal, supple, no masses, no thyromegaly Respiratory: clear to auscultation bilaterally, no wheezing, no crackles. Normal respiratory effort. No accessory muscle use.  Cardiovascular: Regular rate and rhythm, no murmurs / rubs / gallops. No extremity edema. 2+ pedal pulses. No carotid bruits.  Abdomen: Soft, mild epigastric tenderness, no guarding/rebound/masses palpated. No hepatosplenomegaly. Bowel sounds positive.  Musculoskeletal: no clubbing / cyanosis. No joint deformity upper and lower extremities. Good ROM, no contractures. Normal muscle tone.  Skin: no significant rashes, lesions, ulcers on limited skin exam Neurologic: CN 2-12 grossly intact. Sensation intact, DTR normal. Strength 5/5 in all 4.  Psychiatric:  Normal judgment and insight. Alert and oriented x 4,. Normal mood.    Labs on Admission: I have personally reviewed following labs and imaging studies  CBC:  Recent Labs Lab 12/08/16 2220  WBC 6.3  HGB 12.4  HCT 36.7  MCV 97.9  PLT 323   Basic Metabolic Panel:  Recent Labs Lab 12/08/16 2220  NA 137  K 3.5  CL 101  CO2 26  GLUCOSE 196*  BUN 16  CREATININE 0.82  CALCIUM 9.4   GFR: Estimated Creatinine Clearance: 37 mL/min (by C-G formula based on SCr of 0.82 mg/dL). Liver Function Tests:  Recent Labs Lab 12/08/16 2220  AST 21  ALT 13*  ALKPHOS 79  BILITOT 0.6  PROT 5.9*  ALBUMIN 3.9    Recent Labs Lab 12/08/16 2220  LIPASE 213*   No results for input(s): AMMONIA in the last 168 hours. Coagulation Profile: No results for input(s): INR, PROTIME in the last 168 hours. Cardiac Enzymes: No results for input(s): CKTOTAL, CKMB, CKMBINDEX, TROPONINI in the last 168 hours. BNP (last 3 results) No results for input(s): PROBNP in the last 8760 hours. HbA1C: No results for input(s): HGBA1C in the last 72 hours. CBG: No results for input(s): GLUCAP in the last 168 hours. Lipid Profile: No results for input(s): CHOL, HDL, LDLCALC, TRIG, CHOLHDL, LDLDIRECT in the last 72 hours. Thyroid Function Tests: No results for input(s): TSH, T4TOTAL, FREET4, T3FREE, THYROIDAB in the last 72 hours. Anemia Panel: No results for input(s): VITAMINB12, FOLATE, FERRITIN, TIBC, IRON, RETICCTPCT in the last 72 hours. Urine analysis:    Component Value Date/Time   COLORURINE YELLOW 06/13/2016 1813   APPEARANCEUR CLEAR 06/13/2016 1813   LABSPEC 1.013 06/13/2016 1813   PHURINE 5.0 06/13/2016 1813   GLUCOSEU NEGATIVE 06/13/2016 1813   HGBUR NEGATIVE 06/13/2016 1813   BILIRUBINUR NEGATIVE 06/13/2016 1813   KETONESUR NEGATIVE 06/13/2016 1813   PROTEINUR NEGATIVE 06/13/2016 1813   UROBILINOGEN 0.2 06/28/2014 0456   NITRITE NEGATIVE 06/13/2016 1813   LEUKOCYTESUR NEGATIVE  06/13/2016 1813    Radiological Exams on Admission: Dg Chest 2 View  Result Date: 12/08/2016 CLINICAL DATA:  Epigastric pain EXAM: CHEST  2 VIEW COMPARISON:  06/13/2016 FINDINGS: Large hiatal hernia is superimposed on the normal-sized cardiac silhouette, with some increase in gaseous distention of the herniated stomach relative to prior comparison. There is aortic atherosclerosis with uncoiling of the thoracic aorta. There is stable dextroconvex curvature of the lower thoracic spine as well as stable kyphosis. The bones are demineralized in appearance. There  is atelectasis at each lung base. No pneumonic consolidation. Osteoarthritis of the [facility] and glenohumeral joints left greater than right. IMPRESSION: 1. Large hiatal hernia with aortic atherosclerosis and bibasilar atelectasis. 2. Osteoarthritis of the glenohumeral and AC joints left greater than right. Electronically Signed   By: Ashley Royalty M.D.   On: 12/08/2016 21:17    EKG: Independently reviewed. Vent. rate 84 BPM PR interval * ms QRS duration 96 ms QT/QTc 394/466 ms P-R-T axes 44 50 59 Sinus rhythm No significant change since last tracing  Assessment/Plan Principal Problem:   Acute pancreatitis Admit to MedSurg/inpatient. Keep nothing by mouth. Continue IV fluids. Analgesics and antiemetics as needed. Hold indapamide. Check fasting lipids. Check right upper quadrant ultrasound in a.m.  Active Problems:   GERD (gastroesophageal reflux disease) Protonix 40 mg IVP every 24 hours.    HTN (hypertension) Hold indapamide. Metoprolol 2.5 mg IVP every 4 hours as needed. Monitor blood pressure.    Diabetes mellitus, type 2 (HCC) Hold metformin 250 mg by mouth daily. Monitor CBG every 6 hours while nothing by mouth. CBG monitoring before meals and at bedtime once cleared for diet.    Sleep apnea Continue CPAP at bedtime.    Hyperlipidemia Continue simvastatin 20 mg by mouth daily. Check fasting lipids in a.m. Monitor  LFTs.      Hypothyroidism Continue levothyroxine 100 g by mouth daily. Monitor TSH as needed.    Anxiety Continue Lexapro 20 mg by mouth daily. Continue alprazolam 0.25 mg by mouth at bedtime.    DVT prophylaxis: Heparin SQ. Code Status: Full code. Family Communication: Her daughter was present in the ED. Disposition Plan: Admit for bowel rest, pain control, IV fluids and lab follow-up. Consults called:  Admission status: Inpatient/MedSurg.   Reubin Milan MD [facility] Pager [phone].  If 7PM-7AM, please contact night-coverage [email or web address] Password [email or web address]  12/08/2016, 11:51 PM

## 2016-12-08 NOTE — ED Provider Notes (Signed)
[facility] DEPT Provider Note   CSN: [id number] Arrival date & time: 12/08/16  2021     History   Chief Complaint Chief Complaint  Patient presents with  . Abdominal Pain    HPI Cristina Middleton is a 83 y.o. female.  HPI  83 y.o. female with a hx of DM, HTN, GERD, presents to the Emergency Department today due to epigastric pain s/p ingestion of lasagna around 6pm. Notes epigastric pain was constant and felt like a pressure. Rated pain 6/10. Took a "green pill" that she normally takes for her hiatal hernia. States she has had episodes like this before. Her children were there and notified EMS. PT states pain improved en route without any EMS intervention. Notes mild epigastric pain currently. No N/V. No CP/SOB. No diaphoresis. No radiation of pain. Able to tolerate PO. No fevers. No cough or URI symptoms. No other symptoms noted.   Past Medical History:  Diagnosis Date  . Anxiety   . Arthritis   . Diabetes mellitus without complication ([place])   . Dislocated shoulder    WAS POPPED BACK IN PLACE  2006???  . GERD (gastroesophageal reflux disease)    TAKES  ACIPHEX  . Hypertension   . Hyperthyroidism    NOT SURE WHICH ONE  . PONV (postoperative nausea and vomiting)    YRS AGO.....(6-7 YRS)  . Sleep apnea    WEARS MASK EVERY NOW AND THEN    Patient Active Problem List   Diagnosis Date Noted  . GIB (gastrointestinal bleeding) 06/13/2016  . Bright red blood per rectum 06/13/2016  . Influenza with respiratory manifestation 06/13/2016  . Acute respiratory failure with hypoxia ([place]) 06/13/2016  . Reactive airway disease 06/13/2016  . Varicose veins of left lower extremity with complications 55/73/2202  . SOB (shortness of breath) 06/28/2014  . Abdominal pain 06/28/2014  . Encounter for nasogastric (NG) tube placement   . SBO (small bowel obstruction) ([place]) 06/27/2014  . Diabetes mellitus without complication ([place]) 54/27/0623  . Acute blood loss anemia 05/26/2012  . HTN  (hypertension) 05/25/2012  . Diabetes mellitus, type 2 ([place]) 05/25/2012  . Osteoarthritis of right knee 05/25/2012  . Sleep apnea 05/25/2012  . GERD (gastroesophageal reflux disease) 05/25/2012  . Hypothyroidism 05/25/2012    Past Surgical History:  Procedure Laterality Date  . BREAST SURGERY     CYST REMOVED -- LEFT  . ENDOVENOUS ABLATION SAPHENOUS VEIN W/ LASER Left 07/11/2016   endovenous laser ablation left greater saphenous vein by Tinnie Gens MD  . [place]  . LIPOMA EXCISION     FROM LEFT BACK  . TONSILLECTOMY    . TOTAL KNEE ARTHROPLASTY  05/25/2012   right knee  . TOTAL KNEE ARTHROPLASTY  05/25/2012   Procedure: TOTAL KNEE ARTHROPLASTY;  Surgeon: Yvette Rack., MD;  Location: [facility];  Service: Orthopedics;  Laterality: Right;    OB History    No data available       Home Medications    Prior to Admission medications   Medication Sig Start Date End Date Taking? Authorizing Provider  acetaminophen (TYLENOL) 325 MG tablet Take 325 mg by mouth every 6 (six) hours as needed for mild pain, moderate pain, fever or headache.     [provider]  ALPRAZolam Duanne Moron) 0.25 MG tablet Take 0.25 mg by mouth at bedtime.     [provider]  aspirin EC 81 MG tablet Take 81 mg by mouth at bedtime.  [provider]  calcium carbonate (OSCAL) 1500 (600 Ca) MG TABS tablet Take 600 mg of elemental calcium by mouth daily.    [provider]  CINNAMON PO Take 1 tablet by mouth daily.     [provider]  dextromethorphan-guaiFENesin (MUCINEX DM) 30-600 MG 12hr tablet Take 1 tablet by mouth 2 (two) times daily. 06/16/16   Sheikh, Omair Latif, DO  diphenhydramine-acetaminophen (TYLENOL PM) 25-500 MG TABS tablet Take 1 tablet by mouth at bedtime as needed (for sleep/pain).    [provider]  escitalopram (LEXAPRO) 20 MG tablet Take 20 mg by mouth daily.    [provider]  indapamide (LOZOL) 2.5 MG tablet  Take 2.5 mg by mouth 3 (three) times a week. Pt takes on Sunday, Wednesday, and Friday.    [provider]  L-Methylfolate-B12-B6-B2 (CEREFOLIN PO) Take 1 tablet by mouth daily.    [provider]  levothyroxine (SYNTHROID, LEVOTHROID) 100 MCG tablet Take 100 mcg by mouth daily before breakfast.    [provider]  loratadine (CLARITIN) 10 MG tablet Take 10 mg by mouth daily as needed for allergies.     [provider]  metFORMIN (GLUCOPHAGE) 500 MG tablet Take 250 mg by mouth daily after breakfast.     [provider]  Multiple Vitamins-Minerals (PRESERVISION AREDS 2) CAPS Take 1 capsule by mouth daily.    [provider]  oseltamivir (TAMIFLU) 30 MG capsule Take 1 capsule (30 mg total) by mouth 2 (two) times daily. 06/16/16   Raiford Noble Latif, DO  polyethylene glycol [facility] / GLYCOLAX) packet Take 17 g by mouth daily as needed for moderate constipation.    [provider]  RABEprazole (ACIPHEX) 20 MG tablet Take 20 mg by mouth 2 (two) times daily.     [provider]  simvastatin (ZOCOR) 20 MG tablet Take 20 mg by mouth at bedtime.     [provider]  vitamin B-12 (CYANOCOBALAMIN) 500 MCG tablet Take 500 mcg by mouth daily.    [provider]    Family History Family History  Problem Relation Age of Onset  . Other Mother   . Other Father   . Breast cancer Sister   . Colon cancer Sister   . CAD Sister   . Marfan syndrome Sister   . CAD Brother   . Marfan syndrome Brother     Social History Social History  Substance Use Topics  . Smoking status: Former Smoker    Packs/day: 1.00    Types: Cigarettes    Quit date: 05/02/1998  . Smokeless tobacco: Never Used  . Alcohol use No     Allergies   Other; Penicillins; and Sulfa antibiotics   Review of Systems Review of Systems ROS reviewed and all are negative for acute change except as noted in the HPI.  Physical Exam Updated Vital  Signs BP 130/72   Pulse 84   Temp 98 F (36.7 C) (Oral)   Resp (!) 27   Ht 5\' 4"  (1.626 m)   Wt 63.5 kg (140 lb)   SpO2 91%   BMI 24.03 kg/m   Physical Exam  Constitutional: She is oriented to person, place, and time. She appears well-developed and well-nourished. No distress.  NAD. Resting Comfortably.   HENT:  Head: Normocephalic and atraumatic.  Right Ear: Tympanic membrane, external ear and ear canal normal.  Left Ear: Tympanic membrane, external ear and ear canal normal.  Nose: Nose normal.  Mouth/Throat: Uvula is midline,  oropharynx is clear and moist and mucous membranes are normal. No trismus in the jaw. No oropharyngeal exudate, posterior oropharyngeal erythema or tonsillar abscesses.  Eyes: Pupils are equal, round, and reactive to light. EOM are normal.  Neck: Normal range of motion. Neck supple. No tracheal deviation present.  Cardiovascular: Normal rate, regular rhythm, S1 normal, S2 normal, normal heart sounds, intact distal pulses and normal pulses.   Pulmonary/Chest: Effort normal and breath sounds normal. No respiratory distress. She has no decreased breath sounds. She has no wheezes. She has no rhonchi. She has no rales.  Abdominal: Normal appearance and bowel sounds are normal. There is tenderness in the epigastric area. There is no rigidity, no rebound, no guarding, no CVA tenderness, no tenderness at McBurney's point and negative Murphy's sign.  Musculoskeletal: Normal range of motion.  Neurological: She is alert and oriented to person, place, and time.  Skin: Skin is warm and dry.  Psychiatric: She has a normal mood and affect. Her speech is normal and behavior is normal. Thought content normal.  Nursing note and vitals reviewed.  ED Treatments / Results  Labs (all labs ordered are listed, but only abnormal results are displayed) Labs Reviewed  CBC - Abnormal; Notable for the following:       Result Value   RBC 3.75 (*)    RDW 15.8 (*)    All other  components within normal limits  COMPREHENSIVE METABOLIC PANEL - Abnormal; Notable for the following:    Glucose, Bld 196 (*)    Total Protein 5.9 (*)    ALT 13 (*)    GFR calc non Af Amer 60 (*)    All other components within normal limits  LIPASE, BLOOD - Abnormal; Notable for the following:    Lipase 213 (*)    All other components within normal limits  I-STAT TROPONIN, ED    EKG  EKG Interpretation  Date/Time:  Thursday December 08 2016 20:53:06 EDT Ventricular Rate:  84 PR Interval:    QRS Duration: 96 QT Interval:  394 QTC Calculation: 466 R Axis:   50 Text Interpretation:  Sinus rhythm No significant change since last tracing Confirmed by Dorie Rank [phone]) on 12/08/2016 9:09:22 PM       Radiology Dg Chest 2 View  Result Date: 12/08/2016 CLINICAL DATA:  Epigastric pain EXAM: CHEST  2 VIEW COMPARISON:  06/13/2016 FINDINGS: Large hiatal hernia is superimposed on the normal-sized cardiac silhouette, with some increase in gaseous distention of the herniated stomach relative to prior comparison. There is aortic atherosclerosis with uncoiling of the thoracic aorta. There is stable dextroconvex curvature of the lower thoracic spine as well as stable kyphosis. The bones are demineralized in appearance. There is atelectasis at each lung base. No pneumonic consolidation. Osteoarthritis of the [facility] and glenohumeral joints left greater than right. IMPRESSION: 1. Large hiatal hernia with aortic atherosclerosis and bibasilar atelectasis. 2. Osteoarthritis of the glenohumeral and AC joints left greater than right. Electronically Signed   By: Ashley Royalty M.D.   On: 12/08/2016 21:17    Procedures Procedures (including critical care time)  Medications Ordered in ED Medications - No data to display   Initial Impression / Assessment and Plan / ED Course  I have reviewed the triage vital signs and the nursing notes.  Pertinent labs & imaging results that were available during my care of the  patient were reviewed by me and considered in my medical decision making (see chart for details).  Final Clinical Impressions(s) /  ED Diagnoses  {I have reviewed and evaluated the relevant laboratory values. {I have reviewed and evaluated the relevant imaging studies. {I have interpreted the relevant EKG. {I have reviewed the relevant previous healthcare records. {I have reviewed EMS Documentation. {I obtained HPI from historian. {Patient discussed with supervising physician.  ED Course:  Assessment: Pt is a 83 y.o. female with a hx of DM, HTN, GERD, presents to the Emergency Department today due to epigastric pain s/p ingestion of lasagna around 6pm. Notes epigastric pain was constant and felt like a pressure. Rated pain 6/10. Took a "green pill" that she normally takes for her hiatal hernia. States she has had episodes like this before. Her children were there and notified EMS. PT states pain improved en route without any EMS intervention. Notes mild epigastric pain currently. No N/V. No CP/SOB. No diaphoresis. No radiation of pain. Able to tolerate PO. No fevers. No cough or URI symptoms.  On exam, pt in NAD. Nontoxic/nonseptic appearing. VSS. Afebrile. Lungs CTA. Heart RRR. Abdomen with mild TTP epigastrum. CBC unremarkable. CMP unremarkable. Lipase elevated 213. Trop negative. EKG unremarkable. CXR with large hiatal hernia noted, but otherwise unremarkable. Seen by attending physician. Will admit to medicine due to new onset pancreatitis. Pt does not drink ETOH. Possible malignancy? Will obtain CT Abdomen. Pt hemodynamically stable.   Disposition/Plan:  Admit Pt acknowledges and agrees with plan  Supervising Physician Dorie Rank, MD  Final diagnoses:  Acute pancreatitis, unspecified complication status, unspecified pancreatitis type    New Prescriptions New Prescriptions   No medications on file       Shary Decamp, Hershal Coria 12/08/16 2332    Dorie Rank, MD 12/08/16 2350

## 2016-12-08 NOTE — ED Triage Notes (Signed)
Pt to ED via [facility] with c/o epigastric pain.  Pt describes it as a cramping type pain.  Family gave pt Gas X  Before coming to ED and now pt st's she is pain free.  Pt has hx of hiatal hernia

## 2016-12-09 ENCOUNTER — Inpatient Hospital Stay (HOSPITAL_COMMUNITY): Payer: Medicare Other

## 2016-12-09 ENCOUNTER — Encounter (HOSPITAL_COMMUNITY): Payer: Self-pay | Admitting: *Deleted

## 2016-12-09 DIAGNOSIS — E119 Type 2 diabetes mellitus without complications: Secondary | ICD-10-CM

## 2016-12-09 DIAGNOSIS — F419 Anxiety disorder, unspecified: Secondary | ICD-10-CM

## 2016-12-09 DIAGNOSIS — K859 Acute pancreatitis without necrosis or infection, unspecified: Principal | ICD-10-CM

## 2016-12-09 DIAGNOSIS — E039 Hypothyroidism, unspecified: Secondary | ICD-10-CM

## 2016-12-09 DIAGNOSIS — E785 Hyperlipidemia, unspecified: Secondary | ICD-10-CM

## 2016-12-09 DIAGNOSIS — I10 Essential (primary) hypertension: Secondary | ICD-10-CM

## 2016-12-09 DIAGNOSIS — K219 Gastro-esophageal reflux disease without esophagitis: Secondary | ICD-10-CM

## 2016-12-09 LAB — LIPID PANEL
CHOLESTEROL: 160 mg/dL (ref 0–200)
HDL: 60 mg/dL (ref >40)
LDL CALC: 70 mg/dL (ref 0–99)
TRIGLYCERIDES: 151 mg/dL — AB (ref <150)
Total CHOL/HDL Ratio: 2.7 RATIO
VLDL: 30 mg/dL (ref 0–40)

## 2016-12-09 LAB — CBC WITH DIFFERENTIAL/PLATELET
Basophils Absolute: 0 10*3/uL (ref 0.0–0.1)
Basophils Relative: 0 %
EOS ABS: 0.1 10*3/uL (ref 0.0–0.7)
Eosinophils Relative: 2 %
HCT: 36.7 % (ref 36.0–46.0)
HEMOGLOBIN: 12.5 g/dL (ref 12.0–15.0)
LYMPHS ABS: 0.9 10*3/uL (ref 0.7–4.0)
Lymphocytes Relative: 16 %
MCH: 33.1 pg (ref 26.0–34.0)
MCHC: 34.1 g/dL (ref 30.0–36.0)
MCV: 97.1 fL (ref 78.0–100.0)
MONO ABS: 0.5 10*3/uL (ref 0.1–1.0)
MONOS PCT: 9 %
NEUTROS PCT: 73 %
Neutro Abs: 3.8 10*3/uL (ref 1.7–7.7)
Platelets: 165 10*3/uL (ref 150–400)
RBC: 3.78 MIL/uL — ABNORMAL LOW (ref 3.87–5.11)
RDW: 15.6 % — ABNORMAL HIGH (ref 11.5–15.5)
WBC: 5.3 10*3/uL (ref 4.0–10.5)

## 2016-12-09 LAB — COMPREHENSIVE METABOLIC PANEL
ALK PHOS: 77 U/L (ref 38–126)
ALT: 12 U/L — ABNORMAL LOW (ref 14–54)
ANION GAP: 9 (ref 5–15)
AST: 20 U/L (ref 15–41)
Albumin: 3.9 g/dL (ref 3.5–5.0)
BILIRUBIN TOTAL: 0.6 mg/dL (ref 0.3–1.2)
BUN: 13 mg/dL (ref 6–20)
CALCIUM: 9.2 mg/dL (ref 8.9–10.3)
CO2: 24 mmol/L (ref 22–32)
Chloride: 103 mmol/L (ref 101–111)
Creatinine, Ser: 0.76 mg/dL (ref 0.44–1.00)
Glucose, Bld: 107 mg/dL — ABNORMAL HIGH (ref 65–99)
Potassium: 3.8 mmol/L (ref 3.5–5.1)
Sodium: 136 mmol/L (ref 135–145)
TOTAL PROTEIN: 6.1 g/dL — AB (ref 6.5–8.1)

## 2016-12-09 LAB — LIPASE, BLOOD: LIPASE: 54 U/L — AB (ref 11–51)

## 2016-12-09 MED ORDER — METFORMIN HCL 500 MG PO TABS: 250.0000 mg | ORAL_TABLET | Freq: Every day | ORAL | Status: DC

## 2016-12-09 MED ORDER — POTASSIUM CHLORIDE IN NACL 20-0.9 MEQ/L-% IV SOLN
INTRAVENOUS | Status: DC
  Administered 2016-12-09 – 2016-12-10 (×2): via INTRAVENOUS
  Filled 2016-12-09 (×3): qty 1000

## 2016-12-09 MED ORDER — METOPROLOL TARTRATE 5 MG/5ML IV SOLN: 2.5000 mg | INTRAVENOUS | Status: DC | PRN

## 2016-12-09 MED ORDER — ONDANSETRON HCL 4 MG PO TABS: 4.0000 mg | ORAL_TABLET | Freq: Four times a day (QID) | ORAL | Status: DC | PRN

## 2016-12-09 MED ORDER — CALCIUM CARBONATE 1250 (500 CA) MG PO TABS
500.0000 mg | ORAL_TABLET | Freq: Every day | ORAL | Status: DC
  Administered 2016-12-09 – 2016-12-10 (×2): 500 mg via ORAL
  Filled 2016-12-09 (×2): qty 1

## 2016-12-09 MED ORDER — LORATADINE 10 MG PO TABS: 10.0000 mg | ORAL_TABLET | Freq: Every day | ORAL | Status: DC | PRN

## 2016-12-09 MED ORDER — ORAL CARE MOUTH RINSE
15.0000 mL | Freq: Two times a day (BID) | OROMUCOSAL | Status: DC
  Administered 2016-12-09 – 2016-12-10 (×3): 15 mL via OROMUCOSAL

## 2016-12-09 MED ORDER — INDAPAMIDE 2.5 MG PO TABS: 2.5000 mg | ORAL_TABLET | ORAL | Status: DC

## 2016-12-09 MED ORDER — ALPRAZOLAM 0.25 MG PO TABS
0.2500 mg | ORAL_TABLET | Freq: Every day | ORAL | Status: DC
  Administered 2016-12-09: 0.25 mg via ORAL
  Filled 2016-12-09: qty 1

## 2016-12-09 MED ORDER — PANTOPRAZOLE SODIUM 40 MG IV SOLR
40.0000 mg | INTRAVENOUS | Status: DC
  Administered 2016-12-09 – 2016-12-10 (×2): 40 mg via INTRAVENOUS
  Filled 2016-12-09 (×2): qty 40

## 2016-12-09 MED ORDER — HEPARIN SODIUM (PORCINE) 5000 UNIT/ML IJ SOLN
5000.0000 [IU] | Freq: Three times a day (TID) | INTRAMUSCULAR | Status: DC
  Administered 2016-12-09 – 2016-12-10 (×4): 5000 [IU] via SUBCUTANEOUS
  Filled 2016-12-09 (×4): qty 1

## 2016-12-09 MED ORDER — LEVOTHYROXINE SODIUM 100 MCG PO TABS
100.0000 ug | ORAL_TABLET | Freq: Every day | ORAL | Status: DC
  Administered 2016-12-09 – 2016-12-10 (×2): 100 ug via ORAL
  Filled 2016-12-09 (×2): qty 1

## 2016-12-09 MED ORDER — ONDANSETRON HCL 4 MG/2ML IJ SOLN: 4.0000 mg | Freq: Four times a day (QID) | INTRAMUSCULAR | Status: DC | PRN

## 2016-12-09 MED ORDER — ACETAMINOPHEN 325 MG PO TABS
325.0000 mg | ORAL_TABLET | Freq: Four times a day (QID) | ORAL | Status: DC | PRN
  Administered 2016-12-09: 325 mg via ORAL
  Filled 2016-12-09: qty 1

## 2016-12-09 MED ORDER — ESCITALOPRAM OXALATE 20 MG PO TABS
20.0000 mg | ORAL_TABLET | Freq: Every day | ORAL | Status: DC
  Administered 2016-12-09 – 2016-12-10 (×2): 20 mg via ORAL
  Filled 2016-12-09 (×2): qty 1

## 2016-12-09 NOTE — Progress Notes (Signed)
Placed patient on CPAP for the night via auto-mode.  

## 2016-12-09 NOTE — ED Notes (Signed)
Dr. Olevia Bowens in to assess pt

## 2016-12-09 NOTE — Progress Notes (Signed)
[facility] PROGRESS NOTE  Cristina Middleton ZYS:[id number] DOB: 1921/03/31 DOA: 12/08/2016 PCP: Jani Gravel, MD  Interim summary and HPI 83 y.o. female with medical history significant of anxiety, osteoarthritis, type 2 diabetes, dislocated shoulder, GERD, hyperlipidemia, hypertension, hypothyroidism, sleep apnea on CPAP, hiatal hernia who is coming to the emergency department with complaints of severe epigastric cramping pain radiated to her back after she ate some past that around 1800. She mentions that she took a Gas X`tablet before coming to the emergency department and is pain free. She denies having this intense pain before. No history of gallstones, alcohol consumption. However she uses indapamide 3 times a week for hypertension and has a history hyperlipidemia. She had mild nausea, but no vomiting.  Assessment/Plan: 1-acute pancreatitis -etiology idiopathic -normal triglycerides -no gallbladder on [place] and normal biliary ducts -no new drugs exposure -no hx of alcohol consumption  -CT scan w/o masses or abnormalities suggesting underlying malignancy -will advance diet and follow CMET in am -lipase trending down -patient w/o nausea or vomiting   2-GERD -will continue PPI  3-HTN -BP is stable -if proven to tolerate PO will resume home antihypertensive regimen   4-diabetes type 2 -will continue holding metformin -follow CBG's  5-HLD -continue statins -lipid panel demonstrated LDL at goal  6-hypothyroidism -will continue synthroid   7-depression/anxiety -continue lexapro and alprazolam    Code Status: Full Family Communication: daughter at bedside  Disposition Plan: will advance diet, follow lipase and CMET in am. Most likely home soon.    Consultants:  None   Procedures:  See below for x-ray reports   Antibiotics:  None   HPI/Subjective: Afebrile, denies CP, SOB, nausea, vomiting or any other acute complaint. Patient reported significant improvement in  her abd pain and will like to eat something.    Objective: Vitals:   12/09/16 0420 12/09/16 1350  BP: 134/64 (!) 144/61  Pulse: 68 66  Resp: 17 16  Temp: 97.9 F (36.6 C) 98.2 F (36.8 C)  SpO2: 95% 91%    Intake/Output Summary (Last 24 hours) at 12/09/16 1840 Last data filed at 12/09/16 1702  Gross per 24 hour  Intake           266.25 ml  Output             1051 ml  Net          -784.75 ml   Filed Weights   12/08/16 2049 12/09/16 0208  Weight: 63.5 kg (140 lb) 76.3 kg (168 lb 3.4 oz)    Exam:   General: afebrile, denies CP or SOB. Currently no nausea, no vomiting and endorses almost complete resolution of her epigastric discomfort.   Cardiovascular: S1 and s2, no rubs, no gallops  Respiratory: CTA bilaterally, normal resp effort and good O2 sat on RA>  Abdomen: soft, slightly tender to deep palpation in epigastric region; no guarding, no masses appreciated.  Musculoskeletal: no edema, no erythema, no cyanosis   Data Reviewed: Basic Metabolic Panel:  Recent Labs Lab 12/08/16 2220 12/09/16 0959  NA 137 136  K 3.5 3.8  CL 101 103  CO2 26 24  GLUCOSE 196* 107*  BUN 16 13  CREATININE 0.82 0.76  CALCIUM 9.4 9.2   Liver Function Tests:  Recent Labs Lab 12/08/16 2220 12/09/16 0959  AST 21 20  ALT 13* 12*  ALKPHOS 79 77  BILITOT 0.6 0.6  PROT 5.9* 6.1*  ALBUMIN 3.9 3.9    Recent Labs Lab 12/08/16 2220 12/09/16 0959  LIPASE 213* 54*   CBC:  Recent Labs Lab 12/08/16 2220 12/09/16 0959  WBC 6.3 5.3  NEUTROABS  --  3.8  HGB 12.4 12.5  HCT 36.7 36.7  MCV 97.9 97.1  PLT 166 165   Studies: Dg Chest 2 View  Result Date: 12/08/2016 CLINICAL DATA:  Epigastric pain EXAM: CHEST  2 VIEW COMPARISON:  06/13/2016 FINDINGS: Large hiatal hernia is superimposed on the normal-sized cardiac silhouette, with some increase in gaseous distention of the herniated stomach relative to prior comparison. There is aortic atherosclerosis with uncoiling of the  thoracic aorta. There is stable dextroconvex curvature of the lower thoracic spine as well as stable kyphosis. The bones are demineralized in appearance. There is atelectasis at each lung base. No pneumonic consolidation. Osteoarthritis of the [facility] and glenohumeral joints left greater than right. IMPRESSION: 1. Large hiatal hernia with aortic atherosclerosis and bibasilar atelectasis. 2. Osteoarthritis of the glenohumeral and AC joints left greater than right. Electronically Signed   By: Ashley Royalty M.D.   On: 12/08/2016 21:17   Ct Abdomen Pelvis W Contrast  Result Date: 12/09/2016 CLINICAL DATA:  Acute onset of epigastric abdominal pain. Initial encounter. EXAM: CT ABDOMEN AND PELVIS WITH CONTRAST TECHNIQUE: Multidetector CT imaging of the abdomen and pelvis was performed using the standard protocol following bolus administration of intravenous contrast. CONTRAST:  154mL ISOVUE-300 IOPAMIDOL (ISOVUE-300) INJECTION 61% COMPARISON:  CT of the abdomen and pelvis performed 06/28/2014 FINDINGS: Lower chest: A large hiatal hernia is noted, including most of the stomach, with surrounding atelectasis. Mildly prominent gastric varices are noted. Hepatobiliary: The liver is unremarkable in appearance. The gallbladder is unremarkable in appearance. The common bile duct remains normal in caliber. Pancreas: The pancreas is within normal limits. Spleen: The spleen is unremarkable in appearance. Adrenals/Urinary Tract: The adrenal glands are unremarkable in appearance. Small bilateral renal cysts are seen. There is no evidence of hydronephrosis. No renal or ureteral stones are identified. No perinephric stranding is appreciated. Stomach/Bowel: The stomach is unremarkable in appearance. The small bowel is within normal limits. The appendix is normal in caliber, without evidence of appendicitis. Diffuse diverticulosis is noted along the transverse, descending and sigmoid colon, without evidence of diverticulitis.  Vascular/Lymphatic: Diffuse calcification is seen along the abdominal aorta and its branches. The abdominal aorta is otherwise grossly unremarkable. The inferior vena cava is grossly unremarkable. No retroperitoneal lymphadenopathy is seen. No pelvic sidewall lymphadenopathy is identified. Reproductive: The bladder is mildly distended and within normal limits. The uterus is grossly unremarkable in appearance. The ovaries are relatively symmetric. No suspicious adnexal masses are seen. Other: No additional soft tissue abnormalities are seen. Musculoskeletal: No acute osseous abnormalities are identified. Multilevel vacuum phenomenon is noted along the lumbar spine, with underlying facet disease. The visualized musculature is unremarkable in appearance. IMPRESSION: 1. Large hiatal hernia again noted, including most of the stomach, with surrounding atelectasis. Mildly prominent gastric varices noted. 2. Small bilateral renal cysts. 3. Diffuse diverticulosis along the transverse, descending and sigmoid colon, without evidence of diverticulitis. 4. Diffuse aortic atherosclerosis. 5. Degenerative change along the lumbar spine. Electronically Signed   By: Garald Balding M.D.   On: 12/09/2016 01:06   US Abdomen Limited Ruq  Result Date: 12/09/2016 CLINICAL DATA:  Pancreatitis. EXAM: ULTRASOUND ABDOMEN LIMITED RIGHT UPPER QUADRANT COMPARISON:  CT scan of same day. FINDINGS: Gallbladder: No gallstones or wall thickening visualized. No sonographic Murphy sign noted by sonographer. 1 mm polyp is noted. Common bile duct: Diameter: 2.8 mm which is within normal  limits. Liver: No focal lesion identified. Within normal limits in parenchymal echogenicity. Normal hepatopetal flow is noted in the main portal vein. IMPRESSION: Small gallbladder polyp. No other abnormality seen in the right upper quadrant of the abdomen. Electronically Signed   By: Marijo Conception, M.D.   On: 12/09/2016 11:34    Scheduled Meds: . ALPRAZolam   0.25 mg Oral QHS  . calcium carbonate  500 mg of elemental calcium Oral Daily  . escitalopram  20 mg Oral Daily  . heparin  5,000 Units Subcutaneous Q8H  . levothyroxine  100 mcg Oral QAC breakfast  . mouth rinse  15 mL Mouth Rinse BID  . pantoprazole (PROTONIX) IV  40 mg Intravenous Q24H   Continuous Infusions: . 0.9 % NaCl with KCl 20 mEq / L 75 mL/hr at 12/09/16 0227    Principal Problem:   Acute pancreatitis Active Problems:   HTN (hypertension)   Diabetes mellitus, type 2 (HCC)   Sleep apnea   GERD (gastroesophageal reflux disease)   Hypothyroidism   Anxiety   Hyperlipidemia    Time spent: 25 minutes    Barton Dubois  [facility] Pager [phone] 7PM-7AM, please contact night-coverage at [email or web address], password [facility] 12/09/2016, 6:40 PM  LOS: 1 day

## 2016-12-09 NOTE — Progress Notes (Signed)
Patient placed herself on home CPAP for the night  

## 2016-12-10 DIAGNOSIS — G4730 Sleep apnea, unspecified: Secondary | ICD-10-CM

## 2016-12-10 LAB — COMPREHENSIVE METABOLIC PANEL
ALK PHOS: 78 U/L (ref 38–126)
ALT: 12 U/L — AB (ref 14–54)
AST: 17 U/L (ref 15–41)
Albumin: 3.5 g/dL (ref 3.5–5.0)
Anion gap: 9 (ref 5–15)
BUN: 9 mg/dL (ref 6–20)
CALCIUM: 9.3 mg/dL (ref 8.9–10.3)
CHLORIDE: 104 mmol/L (ref 101–111)
CO2: 26 mmol/L (ref 22–32)
Creatinine, Ser: 0.66 mg/dL (ref 0.44–1.00)
GFR calc Af Amer: >60 mL/min (ref >60)
Glucose, Bld: 98 mg/dL (ref 65–99)
Potassium: 3.9 mmol/L (ref 3.5–5.1)
Sodium: 139 mmol/L (ref 135–145)
TOTAL PROTEIN: 5.9 g/dL — AB (ref 6.5–8.1)
Total Bilirubin: 0.7 mg/dL (ref 0.3–1.2)

## 2016-12-10 LAB — CBC WITH DIFFERENTIAL/PLATELET
BASOS ABS: 0 10*3/uL (ref 0.0–0.1)
Basophils Relative: 0 %
EOS PCT: 4 %
Eosinophils Absolute: 0.2 10*3/uL (ref 0.0–0.7)
HEMATOCRIT: 36.8 % (ref 36.0–46.0)
HEMOGLOBIN: 12.7 g/dL (ref 12.0–15.0)
LYMPHS ABS: 1.1 10*3/uL (ref 0.7–4.0)
LYMPHS PCT: 27 %
MCH: 33.5 pg (ref 26.0–34.0)
MCHC: 34.5 g/dL (ref 30.0–36.0)
MCV: 97.1 fL (ref 78.0–100.0)
Monocytes Absolute: 0.5 10*3/uL (ref 0.1–1.0)
Monocytes Relative: 13 %
NEUTROS ABS: 2.3 10*3/uL (ref 1.7–7.7)
NEUTROS PCT: 56 %
PLATELETS: 165 10*3/uL (ref 150–400)
RBC: 3.79 MIL/uL — AB (ref 3.87–5.11)
RDW: 15.4 % (ref 11.5–15.5)
WBC: 4.1 10*3/uL (ref 4.0–10.5)

## 2016-12-10 LAB — LIPASE, BLOOD: LIPASE: 41 U/L (ref 11–51)

## 2016-12-10 MED ORDER — PANTOPRAZOLE SODIUM 40 MG PO TBEC: 40.0000 mg | DELAYED_RELEASE_TABLET | Freq: Two times a day (BID) | ORAL | Status: DC

## 2016-12-10 NOTE — Discharge Summary (Signed)
Physician Discharge Summary  [facility] YNW:[id number] DOB: January 07, 1921 DOA: 12/08/2016  PCP: Jani Gravel, MD  Admit date: 12/08/2016 Discharge date: 12/10/2016  Time spent: 35 minutes  Recommendations for Outpatient Follow-up:  1. Repeat CMET to follow electrolytes renal function and LFT's. 2. Reassess complete resolution and no recurrence of abd pain.   Discharge Diagnoses:  Principal Problem:   Acute pancreatitis Active Problems:   HTN (hypertension)   Diabetes mellitus, type 2 (HCC)   Sleep apnea   GERD (gastroesophageal reflux disease)   Hypothyroidism   Anxiety   Hyperlipidemia   Discharge Condition: stable and improved. Discharge home with instructions to follow up with PCP as an outpatient.  Diet recommendation: low fat/heart healthy diet   Filed Weights   12/08/16 2049 12/09/16 0208  Weight: 63.5 kg (140 lb) 76.3 kg (168 lb 3.4 oz)    History of present illness:  83 y.o.femalewith medical history significant of anxiety, osteoarthritis, type 2 diabetes, dislocated shoulder, GERD, hyperlipidemia, hypertension, hypothyroidism, sleep apnea on CPAP,hiatal herniawho is coming to the emergency department with complaints of severe epigastric cramping pain radiated to her back after she ate some past that around 1800. She mentions that she took a Gas X`tablet before coming to the emergency department and is pain free. She denies having this intense pain before. No history of gallstones, alcohol consumption. However she uses indapamide 3 times a week for hypertension and has a history hyperlipidemia. She had mild nausea, but no vomiting.  Hospital Course:  1-acute pancreatitis -etiology idiopathic -patient with normal triglycerides, no gallbladder on [place] and normal biliary ducts -no new drugs exposure -no hx of alcohol consumption  -CT scan w/o masses or abnormalities suggesting underlying malignancy -lipase WNL at discharge and patient tolerating diet -no significant  abd pain, nausea or vomiting present currently. trending down  2-GERD -will continue PPI twice a day   3-HTN -BP is stable -will discharge on home antihypertensive regimen -advise to watch sodium intake   4-diabetes type 2 -stable and controlled -will resume home hypoglycemic regimen   5-HLD -continue statins -lipid panel demonstrated LDL at goal -low fat diet/heart healthy diet recommended   6-hypothyroidism -will continue synthroid   7-depression/anxiety -continue lexapro and alprazolam   8-OSA -will continue CPAP  Procedures: See below for x-ray reports   Consultations:  None   Discharge Exam: Vitals:   12/09/16 2128 12/10/16 0446  BP: (!) 157/62 (!) 169/66  Pulse: 65 61  Resp: 18 20  Temp: 98.6 F (37 C) 97.7 F (36.5 C)  SpO2: 91% 93%    General: afebrile, denies CP or SOB. Currently no nausea, no vomiting and endorses almost complete resolution of her epigastric discomfort. Tolerating diet w/o problem.  Cardiovascular: S1 and S2, no rubs, no gallops, no JVD  Respiratory: CTA bilaterally, normal resp effort and good O2 sat on RA>  Abdomen: soft, minimally tender to deep palpation in her mid abdominal region; no guarding, no masses appreciated; positive BS.  Musculoskeletal: no edema, no erythema, no cyanosis    Discharge Instructions   Discharge Instructions    Diet - low sodium heart healthy    Complete by:  As directed    Discharge instructions    Complete by:  As directed    Follow low fat diet and watch sodium intake Keep yourself well hydrated Follow up with PCP as scheduled   Increase activity slowly    Complete by:  As directed      Current Discharge Medication List  CONTINUE these medications which have NOT CHANGED   Details  acetaminophen (TYLENOL) 325 MG tablet Take 325 mg by mouth every 6 (six) hours as needed for mild pain, moderate pain, fever or headache.     ALPRAZolam (XANAX) 0.25 MG tablet Take 0.25 mg by  mouth at bedtime.     aspirin EC 81 MG tablet Take 81 mg by mouth at bedtime.     calcium carbonate (OSCAL) 1500 (600 Ca) MG TABS tablet Take 600 mg of elemental calcium by mouth daily.    CINNAMON PO Take 1 tablet by mouth daily.     diphenhydramine-acetaminophen (TYLENOL PM) 25-500 MG TABS tablet Take 1 tablet by mouth at bedtime as needed (for sleep/pain).    escitalopram (LEXAPRO) 20 MG tablet Take 20 mg by mouth daily.    indapamide (LOZOL) 2.5 MG tablet Take 2.5 mg by mouth 3 (three) times a week. Pt takes on Sunday, Wednesday, and Friday.    L-Methylfolate-B12-B6-B2 (CEREFOLIN PO) Take 1 tablet by mouth daily.    levothyroxine (SYNTHROID, LEVOTHROID) 100 MCG tablet Take 100 mcg by mouth daily before breakfast.    loratadine (CLARITIN) 10 MG tablet Take 10 mg by mouth daily as needed for allergies.     metFORMIN (GLUCOPHAGE) 500 MG tablet Take 250 mg by mouth daily after breakfast.     Multiple Vitamins-Minerals (PRESERVISION AREDS 2) CAPS Take 1 capsule by mouth daily.    polyethylene glycol (MIRALAX / GLYCOLAX) packet Take 17 g by mouth daily as needed for moderate constipation.    RABEprazole (ACIPHEX) 20 MG tablet Take 20 mg by mouth 2 (two) times daily.     simvastatin (ZOCOR) 20 MG tablet Take 20 mg by mouth at bedtime.     vitamin B-12 (CYANOCOBALAMIN) 500 MCG tablet Take 500 mcg by mouth daily.      STOP taking these medications     dextromethorphan-guaiFENesin (MUCINEX DM) 30-600 MG 12hr tablet        Allergies  Allergen Reactions  . Other Other (See Comments)    Pt is allergic to Alka-Seltzer tablets.   Reaction:  Hallucinations   . Penicillins Hives, Itching, Swelling and Other (See Comments)    Reaction:  Unspecified swelling reaction Has patient had a PCN reaction causing immediate rash, facial/tongue/throat swelling, SOB or lightheadedness with hypotension: Yes Has patient had a PCN reaction causing severe rash involving mucus membranes or skin  necrosis: No Has patient had a PCN reaction that required hospitalization No Has patient had a PCN reaction occurring within the last 10 years: No If all of the above answers are "NO", then may proceed with Cephalosporin use.  . Sulfa Antibiotics Hives, Itching, Swelling and Other (See Comments)    Reaction:  Unspecified swelling reaction   Follow-up Information    Jani Gravel, MD Follow up.   Specialty:  Internal Medicine Contact information: [place] [address] [phone]            The results of significant diagnostics from this hospitalization (including imaging, microbiology, ancillary and laboratory) are listed below for reference.    Significant Diagnostic Studies: Dg Chest 2 View  Result Date: 12/08/2016 CLINICAL DATA:  Epigastric pain EXAM: CHEST  2 VIEW COMPARISON:  06/13/2016 FINDINGS: Large hiatal hernia is superimposed on the normal-sized cardiac silhouette, with some increase in gaseous distention of the herniated stomach relative to prior comparison. There is aortic atherosclerosis with uncoiling of the thoracic aorta. There is stable dextroconvex curvature of the lower  thoracic spine as well as stable kyphosis. The bones are demineralized in appearance. There is atelectasis at each lung base. No pneumonic consolidation. Osteoarthritis of the [facility] and glenohumeral joints left greater than right. IMPRESSION: 1. Large hiatal hernia with aortic atherosclerosis and bibasilar atelectasis. 2. Osteoarthritis of the glenohumeral and AC joints left greater than right. Electronically Signed   By: Ashley Royalty M.D.   On: 12/08/2016 21:17   Ct Abdomen Pelvis W Contrast  Result Date: 12/09/2016 CLINICAL DATA:  Acute onset of epigastric abdominal pain. Initial encounter. EXAM: CT ABDOMEN AND PELVIS WITH CONTRAST TECHNIQUE: Multidetector CT imaging of the abdomen and pelvis was performed using the standard protocol following bolus administration of  intravenous contrast. CONTRAST:  160mL ISOVUE-300 IOPAMIDOL (ISOVUE-300) INJECTION 61% COMPARISON:  CT of the abdomen and pelvis performed 06/28/2014 FINDINGS: Lower chest: A large hiatal hernia is noted, including most of the stomach, with surrounding atelectasis. Mildly prominent gastric varices are noted. Hepatobiliary: The liver is unremarkable in appearance. The gallbladder is unremarkable in appearance. The common bile duct remains normal in caliber. Pancreas: The pancreas is within normal limits. Spleen: The spleen is unremarkable in appearance. Adrenals/Urinary Tract: The adrenal glands are unremarkable in appearance. Small bilateral renal cysts are seen. There is no evidence of hydronephrosis. No renal or ureteral stones are identified. No perinephric stranding is appreciated. Stomach/Bowel: The stomach is unremarkable in appearance. The small bowel is within normal limits. The appendix is normal in caliber, without evidence of appendicitis. Diffuse diverticulosis is noted along the transverse, descending and sigmoid colon, without evidence of diverticulitis. Vascular/Lymphatic: Diffuse calcification is seen along the abdominal aorta and its branches. The abdominal aorta is otherwise grossly unremarkable. The inferior vena cava is grossly unremarkable. No retroperitoneal lymphadenopathy is seen. No pelvic sidewall lymphadenopathy is identified. Reproductive: The bladder is mildly distended and within normal limits. The uterus is grossly unremarkable in appearance. The ovaries are relatively symmetric. No suspicious adnexal masses are seen. Other: No additional soft tissue abnormalities are seen. Musculoskeletal: No acute osseous abnormalities are identified. Multilevel vacuum phenomenon is noted along the lumbar spine, with underlying facet disease. The visualized musculature is unremarkable in appearance. IMPRESSION: 1. Large hiatal hernia again noted, including most of the stomach, with surrounding  atelectasis. Mildly prominent gastric varices noted. 2. Small bilateral renal cysts. 3. Diffuse diverticulosis along the transverse, descending and sigmoid colon, without evidence of diverticulitis. 4. Diffuse aortic atherosclerosis. 5. Degenerative change along the lumbar spine. Electronically Signed   By: Garald Balding M.D.   On: 12/09/2016 01:06   US Abdomen Limited Ruq  Result Date: 12/09/2016 CLINICAL DATA:  Pancreatitis. EXAM: ULTRASOUND ABDOMEN LIMITED RIGHT UPPER QUADRANT COMPARISON:  CT scan of same day. FINDINGS: Gallbladder: No gallstones or wall thickening visualized. No sonographic Murphy sign noted by sonographer. 1 mm polyp is noted. Common bile duct: Diameter: 2.8 mm which is within normal limits. Liver: No focal lesion identified. Within normal limits in parenchymal echogenicity. Normal hepatopetal flow is noted in the main portal vein. IMPRESSION: Small gallbladder polyp. No other abnormality seen in the right upper quadrant of the abdomen. Electronically Signed   By: Marijo Conception, M.D.   On: 12/09/2016 11:34   Labs: Basic Metabolic Panel:  Recent Labs Lab 12/08/16 2220 12/09/16 0959 12/10/16 0410  NA 137 136 139  K 3.5 3.8 3.9  CL 101 103 104  CO2 26 24 26   GLUCOSE 196* 107* 98  BUN 16 13 9   CREATININE 0.82 0.76 0.66  CALCIUM 9.4  9.2 9.3   Liver Function Tests:  Recent Labs Lab 12/08/16 2220 12/09/16 0959 12/10/16 0410  AST 21 20 17   ALT 13* 12* 12*  ALKPHOS 79 77 78  BILITOT 0.6 0.6 0.7  PROT 5.9* 6.1* 5.9*  ALBUMIN 3.9 3.9 3.5    Recent Labs Lab 12/08/16 2220 12/09/16 0959 12/10/16 0410  LIPASE 213* 54* 41   CBC:  Recent Labs Lab 12/08/16 2220 12/09/16 0959 12/10/16 0410  WBC 6.3 5.3 4.1  NEUTROABS  --  3.8 2.3  HGB 12.4 12.5 12.7  HCT 36.7 36.7 36.8  MCV 97.9 97.1 97.1  PLT 166 165 165    Signed:  Barton Dubois MD.  [facility] 12/10/2016, 10:15 AM

## 2016-12-13 DIAGNOSIS — I10 Essential (primary) hypertension: Secondary | ICD-10-CM | POA: Diagnosis not present

## 2016-12-13 DIAGNOSIS — E039 Hypothyroidism, unspecified: Secondary | ICD-10-CM | POA: Diagnosis not present

## 2016-12-13 DIAGNOSIS — E7800 Pure hypercholesterolemia, unspecified: Secondary | ICD-10-CM | POA: Diagnosis not present

## 2016-12-13 DIAGNOSIS — E118 Type 2 diabetes mellitus with unspecified complications: Secondary | ICD-10-CM | POA: Diagnosis not present

## 2017-01-23 DIAGNOSIS — Z23 Encounter for immunization: Secondary | ICD-10-CM | POA: Diagnosis not present

## 2017-02-01 DIAGNOSIS — H40013 Open angle with borderline findings, low risk, bilateral: Secondary | ICD-10-CM | POA: Diagnosis not present

## 2017-02-01 DIAGNOSIS — H43811 Vitreous degeneration, right eye: Secondary | ICD-10-CM | POA: Diagnosis not present

## 2017-02-01 DIAGNOSIS — H02834 Dermatochalasis of left upper eyelid: Secondary | ICD-10-CM | POA: Diagnosis not present

## 2017-02-01 DIAGNOSIS — E119 Type 2 diabetes mellitus without complications: Secondary | ICD-10-CM | POA: Diagnosis not present

## 2017-02-01 DIAGNOSIS — H1859 Other hereditary corneal dystrophies: Secondary | ICD-10-CM | POA: Diagnosis not present

## 2017-02-01 DIAGNOSIS — H353131 Nonexudative age-related macular degeneration, bilateral, early dry stage: Secondary | ICD-10-CM | POA: Diagnosis not present

## 2017-02-01 DIAGNOSIS — Z961 Presence of intraocular lens: Secondary | ICD-10-CM | POA: Diagnosis not present

## 2017-02-01 DIAGNOSIS — H02831 Dermatochalasis of right upper eyelid: Secondary | ICD-10-CM | POA: Diagnosis not present

## 2017-02-01 DIAGNOSIS — H10023 Other mucopurulent conjunctivitis, bilateral: Secondary | ICD-10-CM | POA: Diagnosis not present

## 2017-03-13 DIAGNOSIS — R4182 Altered mental status, unspecified: Secondary | ICD-10-CM | POA: Diagnosis not present

## 2017-03-13 DIAGNOSIS — R410 Disorientation, unspecified: Secondary | ICD-10-CM | POA: Diagnosis not present

## 2017-03-13 DIAGNOSIS — N390 Urinary tract infection, site not specified: Secondary | ICD-10-CM | POA: Diagnosis not present

## 2017-03-13 DIAGNOSIS — I10 Essential (primary) hypertension: Secondary | ICD-10-CM | POA: Diagnosis not present

## 2017-03-13 DIAGNOSIS — F0390 Unspecified dementia without behavioral disturbance: Secondary | ICD-10-CM | POA: Diagnosis not present

## 2017-03-13 DIAGNOSIS — D509 Iron deficiency anemia, unspecified: Secondary | ICD-10-CM | POA: Diagnosis not present

## 2017-03-13 DIAGNOSIS — E039 Hypothyroidism, unspecified: Secondary | ICD-10-CM | POA: Diagnosis not present

## 2017-03-13 DIAGNOSIS — Z79899 Other long term (current) drug therapy: Secondary | ICD-10-CM | POA: Diagnosis not present

## 2017-03-13 DIAGNOSIS — E7800 Pure hypercholesterolemia, unspecified: Secondary | ICD-10-CM | POA: Diagnosis not present

## 2017-03-13 DIAGNOSIS — E119 Type 2 diabetes mellitus without complications: Secondary | ICD-10-CM | POA: Diagnosis not present

## 2017-03-21 DIAGNOSIS — F515 Nightmare disorder: Secondary | ICD-10-CM | POA: Diagnosis not present

## 2017-03-21 DIAGNOSIS — F329 Major depressive disorder, single episode, unspecified: Secondary | ICD-10-CM | POA: Diagnosis not present

## 2017-03-21 DIAGNOSIS — F419 Anxiety disorder, unspecified: Secondary | ICD-10-CM | POA: Diagnosis not present

## 2017-03-21 DIAGNOSIS — R410 Disorientation, unspecified: Secondary | ICD-10-CM | POA: Diagnosis not present

## 2017-03-21 DIAGNOSIS — Z789 Other specified health status: Secondary | ICD-10-CM | POA: Diagnosis not present

## 2017-04-14 DIAGNOSIS — N390 Urinary tract infection, site not specified: Secondary | ICD-10-CM | POA: Diagnosis not present

## 2017-05-04 DIAGNOSIS — F0390 Unspecified dementia without behavioral disturbance: Secondary | ICD-10-CM | POA: Diagnosis not present

## 2017-05-06 ENCOUNTER — Emergency Department (HOSPITAL_COMMUNITY): Payer: Medicare Other

## 2017-05-06 ENCOUNTER — Encounter (HOSPITAL_COMMUNITY): Payer: Self-pay | Admitting: Emergency Medicine

## 2017-05-06 ENCOUNTER — Emergency Department (HOSPITAL_COMMUNITY)
Admission: EM | Admit: 2017-05-06 | Discharge: 2017-05-11 | Disposition: A | Discharge status: Other Acute Inpatient Hospital | Payer: Medicare Other | Attending: Emergency Medicine | Admitting: Emergency Medicine

## 2017-05-06 ENCOUNTER — Other Ambulatory Visit: Payer: Self-pay

## 2017-05-06 DIAGNOSIS — E119 Type 2 diabetes mellitus without complications: Secondary | ICD-10-CM | POA: Diagnosis not present

## 2017-05-06 DIAGNOSIS — I10 Essential (primary) hypertension: Secondary | ICD-10-CM | POA: Diagnosis not present

## 2017-05-06 DIAGNOSIS — I700 Atherosclerosis of aorta: Secondary | ICD-10-CM | POA: Diagnosis not present

## 2017-05-06 DIAGNOSIS — E039 Hypothyroidism, unspecified: Secondary | ICD-10-CM | POA: Insufficient documentation

## 2017-05-06 DIAGNOSIS — Z7982 Long term (current) use of aspirin: Secondary | ICD-10-CM | POA: Insufficient documentation

## 2017-05-06 DIAGNOSIS — Z046 Encounter for general psychiatric examination, requested by authority: Secondary | ICD-10-CM | POA: Insufficient documentation

## 2017-05-06 DIAGNOSIS — F4325 Adjustment disorder with mixed disturbance of emotions and conduct: Secondary | ICD-10-CM | POA: Insufficient documentation

## 2017-05-06 DIAGNOSIS — R4182 Altered mental status, unspecified: Secondary | ICD-10-CM | POA: Diagnosis not present

## 2017-05-06 DIAGNOSIS — Z7984 Long term (current) use of oral hypoglycemic drugs: Secondary | ICD-10-CM | POA: Insufficient documentation

## 2017-05-06 DIAGNOSIS — R441 Visual hallucinations: Secondary | ICD-10-CM | POA: Insufficient documentation

## 2017-05-06 DIAGNOSIS — Z87891 Personal history of nicotine dependence: Secondary | ICD-10-CM | POA: Diagnosis not present

## 2017-05-06 DIAGNOSIS — Z0000 Encounter for general adult medical examination without abnormal findings: Secondary | ICD-10-CM

## 2017-05-06 DIAGNOSIS — R4587 Impulsiveness: Secondary | ICD-10-CM | POA: Diagnosis not present

## 2017-05-06 DIAGNOSIS — Z79899 Other long term (current) drug therapy: Secondary | ICD-10-CM | POA: Diagnosis not present

## 2017-05-06 DIAGNOSIS — R451 Restlessness and agitation: Secondary | ICD-10-CM | POA: Diagnosis not present

## 2017-05-06 DIAGNOSIS — Z96651 Presence of right artificial knee joint: Secondary | ICD-10-CM | POA: Diagnosis not present

## 2017-05-06 DIAGNOSIS — F0151 Vascular dementia with behavioral disturbance: Secondary | ICD-10-CM | POA: Diagnosis not present

## 2017-05-06 DIAGNOSIS — F01518 Vascular dementia, unspecified severity, with other behavioral disturbance: Secondary | ICD-10-CM

## 2017-05-06 DIAGNOSIS — R4189 Other symptoms and signs involving cognitive functions and awareness: Secondary | ICD-10-CM | POA: Insufficient documentation

## 2017-05-06 DIAGNOSIS — R4689 Other symptoms and signs involving appearance and behavior: Secondary | ICD-10-CM

## 2017-05-06 LAB — COMPREHENSIVE METABOLIC PANEL
ALK PHOS: 73 U/L (ref 38–126)
ALT: 15 U/L (ref 14–54)
AST: 22 U/L (ref 15–41)
Albumin: 4.4 g/dL (ref 3.5–5.0)
Anion gap: 11 (ref 5–15)
BUN: 14 mg/dL (ref 6–20)
CALCIUM: 9.3 mg/dL (ref 8.9–10.3)
CHLORIDE: 99 mmol/L — AB (ref 101–111)
CO2: 26 mmol/L (ref 22–32)
CREATININE: 0.88 mg/dL (ref 0.44–1.00)
GFR calc Af Amer: >60 mL/min (ref >60)
GFR calc non Af Amer: 55 mL/min — ABNORMAL LOW (ref >60)
GLUCOSE: 86 mg/dL (ref 65–99)
Potassium: 3.5 mmol/L (ref 3.5–5.1)
SODIUM: 136 mmol/L (ref 135–145)
Total Bilirubin: 1.1 mg/dL (ref 0.3–1.2)
Total Protein: 6.5 g/dL (ref 6.5–8.1)

## 2017-05-06 LAB — CBC
HEMATOCRIT: 37.1 % (ref 36.0–46.0)
HEMOGLOBIN: 12.8 g/dL (ref 12.0–15.0)
MCH: 34.1 pg — AB (ref 26.0–34.0)
MCHC: 34.5 g/dL (ref 30.0–36.0)
MCV: 98.9 fL (ref 78.0–100.0)
Platelets: 208 10*3/uL (ref 150–400)
RBC: 3.75 MIL/uL — AB (ref 3.87–5.11)
RDW: 15.1 % (ref 11.5–15.5)
WBC: 5.3 10*3/uL (ref 4.0–10.5)

## 2017-05-06 LAB — RAPID URINE DRUG SCREEN, HOSP PERFORMED
AMPHETAMINES: NOT DETECTED
BARBITURATES: NOT DETECTED
Benzodiazepines: POSITIVE — AB
Cocaine: NOT DETECTED
OPIATES: NOT DETECTED
TETRAHYDROCANNABINOL: NOT DETECTED

## 2017-05-06 LAB — ETHANOL: Alcohol, Ethyl (B): <10 mg/dL (ref <10)

## 2017-05-06 LAB — SALICYLATE LEVEL: Salicylate Lvl: <7 mg/dL (ref 2.8–30.0)

## 2017-05-06 LAB — ACETAMINOPHEN LEVEL: Acetaminophen (Tylenol), Serum: <10 ug/mL — ABNORMAL LOW (ref 10–30)

## 2017-05-06 MED ORDER — METFORMIN HCL 500 MG PO TABS
250.0000 mg | ORAL_TABLET | Freq: Every day | ORAL | Status: DC
  Administered 2017-05-07 – 2017-05-11 (×5): 250 mg via ORAL
  Filled 2017-05-06 (×5): qty 1

## 2017-05-06 MED ORDER — CALCIUM CARBONATE 1250 (500 CA) MG PO TABS
1.0000 | ORAL_TABLET | Freq: Every day | ORAL | Status: DC
  Administered 2017-05-07 – 2017-05-11 (×5): 500 mg via ORAL
  Filled 2017-05-06 (×5): qty 1

## 2017-05-06 MED ORDER — ASPIRIN EC 81 MG PO TBEC
81.0000 mg | DELAYED_RELEASE_TABLET | Freq: Every day | ORAL | Status: DC
  Administered 2017-05-06 – 2017-05-08 (×3): 81 mg via ORAL
  Filled 2017-05-06 (×5): qty 1

## 2017-05-06 MED ORDER — LEVOTHYROXINE SODIUM 100 MCG PO TABS
100.0000 ug | ORAL_TABLET | Freq: Every day | ORAL | Status: DC
  Administered 2017-05-07 – 2017-05-11 (×5): 100 ug via ORAL
  Filled 2017-05-06 (×5): qty 1

## 2017-05-06 MED ORDER — CALCIUM CARBONATE 1500 (600 CA) MG PO TABS
600.0000 mg | ORAL_TABLET | Freq: Every day | ORAL | Status: DC
  Filled 2017-05-06: qty 1

## 2017-05-06 MED ORDER — ESCITALOPRAM OXALATE 10 MG PO TABS
20.0000 mg | ORAL_TABLET | Freq: Every day | ORAL | Status: DC
  Administered 2017-05-07 – 2017-05-11 (×5): 20 mg via ORAL
  Filled 2017-05-06 (×5): qty 2

## 2017-05-06 MED ORDER — PANTOPRAZOLE SODIUM 40 MG PO TBEC
40.0000 mg | DELAYED_RELEASE_TABLET | Freq: Every day | ORAL | Status: DC
  Administered 2017-05-07 – 2017-05-11 (×5): 40 mg via ORAL
  Filled 2017-05-06 (×5): qty 1

## 2017-05-06 MED ORDER — SIMVASTATIN 20 MG PO TABS
20.0000 mg | ORAL_TABLET | Freq: Every day | ORAL | Status: DC
  Administered 2017-05-06 – 2017-05-08 (×3): 20 mg via ORAL
  Filled 2017-05-06 (×5): qty 1

## 2017-05-06 MED ORDER — INDAPAMIDE 1.25 MG PO TABS
2.5000 mg | ORAL_TABLET | ORAL | Status: DC
  Administered 2017-05-07 – 2017-05-10 (×2): 2.5 mg via ORAL
  Filled 2017-05-06 (×2): qty 2

## 2017-05-06 MED ORDER — ALPRAZOLAM 0.25 MG PO TABS
0.2500 mg | ORAL_TABLET | Freq: Every day | ORAL | Status: DC
  Administered 2017-05-06 – 2017-05-11 (×4): 0.25 mg via ORAL
  Filled 2017-05-06 (×4): qty 1

## 2017-05-06 MED ORDER — ACETAMINOPHEN 325 MG PO TABS
325.0000 mg | ORAL_TABLET | Freq: Four times a day (QID) | ORAL | Status: DC | PRN
  Administered 2017-05-07: 325 mg via ORAL
  Filled 2017-05-06: qty 1

## 2017-05-06 MED ORDER — POLYETHYLENE GLYCOL 3350 17 G PO PACK
17.0000 g | PACK | Freq: Every day | ORAL | Status: DC | PRN
  Filled 2017-05-06: qty 1

## 2017-05-06 MED ORDER — CYANOCOBALAMIN 500 MCG PO TABS
500.0000 ug | ORAL_TABLET | Freq: Every day | ORAL | Status: DC
  Administered 2017-05-07 – 2017-05-11 (×5): 500 ug via ORAL
  Filled 2017-05-06 (×5): qty 1
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
## 2017-05-06 NOTE — ED Notes (Signed)
Patient transported to CT 
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
## 2017-05-06 NOTE — BH Assessment (Signed)
[facility] Assessment Progress Note Case was staffed with Romilda Garret FNP who recommended a inpatient admission as appropriate bed placement is investigated.

## 2017-05-06 NOTE — ED Notes (Signed)
Bed: HY07 Expected date:  Expected time:  Means of arrival:  Comments: 84 yo IVC Dementia
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
## 2017-05-06 NOTE — ED Notes (Addendum)
Power of Attroney:  Son-Frankie [employer] [phone]  Derrill Memo [phone]

## 2017-05-07 ENCOUNTER — Emergency Department (HOSPITAL_COMMUNITY): Payer: Medicare Other

## 2017-05-07 DIAGNOSIS — R4587 Impulsiveness: Secondary | ICD-10-CM

## 2017-05-07 DIAGNOSIS — F0151 Vascular dementia with behavioral disturbance: Secondary | ICD-10-CM | POA: Diagnosis not present

## 2017-05-07 DIAGNOSIS — F4325 Adjustment disorder with mixed disturbance of emotions and conduct: Secondary | ICD-10-CM | POA: Diagnosis not present

## 2017-05-07 DIAGNOSIS — F01518 Vascular dementia, unspecified severity, with other behavioral disturbance: Secondary | ICD-10-CM

## 2017-05-07 DIAGNOSIS — Z87891 Personal history of nicotine dependence: Secondary | ICD-10-CM

## 2017-05-07 DIAGNOSIS — I700 Atherosclerosis of aorta: Secondary | ICD-10-CM | POA: Diagnosis not present

## 2017-05-07 LAB — URINALYSIS, COMPLETE (UACMP) WITH MICROSCOPIC
BILIRUBIN URINE: NEGATIVE
Glucose, UA: NEGATIVE mg/dL
HGB URINE DIPSTICK: NEGATIVE
Ketones, ur: NEGATIVE mg/dL
NITRITE: NEGATIVE
PH: 7 (ref 5.0–8.0)
Protein, ur: NEGATIVE mg/dL
SPECIFIC GRAVITY, URINE: 1.01 (ref 1.005–1.030)

## 2017-05-07 MED ORDER — HALOPERIDOL LACTATE 5 MG/ML IJ SOLN
INTRAMUSCULAR | Status: AC
  Administered 2017-05-07: 1 mg via INTRAMUSCULAR
  Filled 2017-05-07: qty 1

## 2017-05-07 MED ORDER — HALOPERIDOL LACTATE 5 MG/ML IJ SOLN
1.0000 mg | Freq: Once | INTRAMUSCULAR | Status: AC
  Administered 2017-05-07: 1 mg via INTRAMUSCULAR

## 2017-05-07 MED ORDER — HALOPERIDOL 1 MG PO TABS
1.0000 mg | ORAL_TABLET | Freq: Once | ORAL | Status: DC
  Filled 2017-05-07: qty 1

## 2017-05-07 NOTE — Progress Notes (Addendum)
CSW faced pt out to [facility] facilities at the recommendation of the psychiatrist.  CSW faxed out referrals to [facility] [place], [place], [place], [place], [place], [place].  2:46 PM CSW placed signed 1st exam/optinion in pt's chart.  Please reconsult if future social work needs arise.  CSW signing off, as social work intervention is no longer needed.  Alphonse Guild. Barbarajean Kinzler, LCSW, LCAS, CSI Clinical Social Worker Ph: [phone]

## 2017-05-07 NOTE — Consult Note (Signed)
[name] Consult   Reason for Consult: Dementia  Referring Physician:  EDP Patient Identification: Cristina Middleton MRN:  [id number] Principal Diagnosis: Vascular dementia of acute onset with behavioral disturbance Diagnosis:   Patient Active Problem List   Diagnosis Date Noted  . Adjustment disorder with mixed disturbance of emotions and conduct [F43.25] 05/07/2017  . Anxiety [F41.9] 12/09/2016  . Hyperlipidemia [E78.5] 12/09/2016  . Acute pancreatitis [K85.90] 12/08/2016  . GIB (gastrointestinal bleeding) [K92.2] 06/13/2016  . Bright red blood per rectum [K62.5] 06/13/2016  . Influenza with respiratory manifestation [J11.1] 06/13/2016  . Acute respiratory failure with hypoxia ([place]) [J96.01] 06/13/2016  . Reactive airway disease [J45.909] 06/13/2016  . Varicose veins of left lower extremity with complications [E95.284] 13/24/4010  . SOB (shortness of breath) [R06.02] 06/28/2014  . Abdominal pain [R10.9] 06/28/2014  . Encounter for nasogastric (NG) tube placement [Z46.59]   . SBO (small bowel obstruction) ([place]) [K56.609] 06/27/2014  . Diabetes mellitus without complication ([place]) [U72.5] 06/27/2014  . Acute blood loss anemia [D62] 05/26/2012  . HTN (hypertension) [I10] 05/25/2012  . Diabetes mellitus, type 2 ([place]) [E11.9] 05/25/2012  . Osteoarthritis of right knee [M17.11] 05/25/2012  . Sleep apnea [G47.30] 05/25/2012  . GERD (gastroesophageal reflux disease) [K21.9] 05/25/2012  . Hypothyroidism [E03.9] 05/25/2012    Total Time spent with patient: 45 minutes  Subjective:   Cristina Middleton is a 84 y.o. female patient admitted with acute recent behavioral changes noted over the last couple of months. Patients son noted behavioral changes had become more significant when visiting his mother in December and has been staying at home with her.  HPI:   Patient presented to the emergency room yesterday for a progressively worsening change in her mental status.  Patient's  son wants to be safe as he feels that his mother needs a higher level of care than he can presently provide at home. History was provided by the patient's son.  According to the patient's son, the patient had a change in her behavior over the last couple of months. Since December the patient's son and daughter have been staying with her and watching her as they were concerned about her behaviors.  They also have home caregivers to stay with the patient 24 hours a day.  Patient has been becoming increasingly agitated and aggressive at times.  Her manner speech is also changed and that she is very loud and deliberate.  She also seems to become angry at times while speaking.  She has had prior violence towards individuals intermittently at home.  At other times, she will be very apologetic and speaks in a more subdued manner.  Patient's symptoms have been progressively getting worse.    Past Psychiatric History: Patient is being treated for Anxiety by Dr. Maudie Mercury.  Risk to Self: Suicidal Ideation: (UTA) Suicidal Intent: (UTA) Is patient at risk for suicide?: (UTA) Suicidal Plan?: (UTA) Access to Means: (UTA) What has been your use of drugs/alcohol within the last 12 months?: (UTA) How many times?: (UTA) Other Self Harm Risks: (UTA) Triggers for Past Attempts: (UTA) Intentional Self Injurious Behavior: (UTA) Risk to Others: Homicidal Ideation: (UTA) Thoughts of Harm to Others: (UTA) Current Homicidal Intent: (UTA) Current Homicidal Plan: (UTA) Access to Homicidal Means: (UTA) Identified Victim: (UTA) History of harm to others?: (UTA) Assessment of Violence: (UTA) Violent Behavior Description: (UTA) Does patient have access to weapons?: (UTA) Criminal Charges Pending?: No Does patient have a court date: No Prior Inpatient Therapy: Prior Inpatient Therapy: No Prior  Therapy Dates: NA Prior Therapy Facilty/Provider(s): NA Reason for Treatment: NA Prior Outpatient Therapy: Prior Outpatient Therapy:  (UTA) Prior Therapy Dates: (UTA) Prior Therapy Facilty/Provider(s): (UTA) Reason for Treatment: (UTA) Does patient have an ACCT team?: Unknown Does patient have Intensive In-House Services?  : Unknown Does patient have [facility] services? : Unknown Does patient have P4[facility] services?: Unknown  Past Medical History:  Past Medical History:  Diagnosis Date  . Anxiety   . Arthritis   . Diabetes mellitus without complication ([place])   . Dislocated shoulder    WAS POPPED BACK IN PLACE  2006???  . GERD (gastroesophageal reflux disease)    TAKES  ACIPHEX  . Hyperlipidemia   . Hypertension   . Hyperthyroidism    NOT SURE WHICH ONE  . PONV (postoperative nausea and vomiting)    YRS AGO.....(6-7 YRS)  . Sleep apnea    WEARS MASK EVERY NOW AND THEN    Past Surgical History:  Procedure Laterality Date  . BREAST SURGERY     CYST REMOVED -- LEFT  . ENDOVENOUS ABLATION SAPHENOUS VEIN W/ LASER Left 07/11/2016   endovenous laser ablation left greater saphenous vein by Tinnie Gens MD  . [place]  . LIPOMA EXCISION     FROM LEFT BACK  . TONSILLECTOMY    . TOTAL KNEE ARTHROPLASTY  05/25/2012   right knee  . TOTAL KNEE ARTHROPLASTY  05/25/2012   Procedure: TOTAL KNEE ARTHROPLASTY;  Surgeon: Yvette Rack., MD;  Location: [facility];  Service: Orthopedics;  Laterality: Right;   Family History:  Family History  Problem Relation Age of Onset  . Other Mother   . Other Father   . Breast cancer Sister   . Colon cancer Sister   . CAD Sister   . Marfan syndrome Sister   . CAD Brother   . Marfan syndrome Brother      Social History:  Social History   Substance and Sexual Activity  Alcohol Use No     Social History   Substance and Sexual Activity  Drug Use No    Social History   Socioeconomic History  . Marital status: Widowed    Spouse name: None  . Number of children: None  . Years of education: None  . Highest education level: None  Social Needs  . Financial  resource strain: None  . Food insecurity - worry: None  . Food insecurity - inability: None  . Transportation needs - medical: None  . Transportation needs - non-medical: None  Occupational History  . None  Tobacco Use  . Smoking status: Former Smoker    Packs/day: 1.00    Types: Cigarettes    Last attempt to quit: 05/02/1998    Years since quitting: 19.0  . Smokeless tobacco: Never Used  Substance and Sexual Activity  . Alcohol use: No  . Drug use: No  . Sexual activity: No  Other Topics Concern  . None  Social History Narrative  . None      Allergies:   Allergies  Allergen Reactions  . Other Other (See Comments)    Pt is allergic to Alka-Seltzer tablets.   Reaction:  Hallucinations   . Penicillins Hives, Itching, Swelling and Other (See Comments)    Reaction:  Unspecified swelling reaction Has patient had a PCN reaction causing immediate rash, facial/tongue/throat swelling, SOB or lightheadedness with hypotension: Yes Has patient had a PCN reaction causing severe rash involving mucus membranes or skin necrosis: No  Has patient had a PCN reaction that required hospitalization No Has patient had a PCN reaction occurring within the last 10 years: No If all of the above answers are "NO", then may proceed with Cephalosporin use.  . Sulfa Antibiotics Hives, Itching, Swelling and Other (See Comments)    Reaction:  Unspecified swelling reaction    Labs:  Results for orders placed or performed during the hospital encounter of 05/06/17 (from the past 48 hour(s))  Comprehensive metabolic panel     Status: Abnormal   Collection Time: 05/06/17  2:50 PM  Result Value Ref Range   Sodium 136 135 - 145 mmol/L   Potassium 3.5 3.5 - 5.1 mmol/L   Chloride 99 (L) 101 - 111 mmol/L   CO2 26 22 - 32 mmol/L   Glucose, Bld 86 65 - 99 mg/dL   BUN 14 6 - 20 mg/dL   Creatinine, Ser 0.88 0.44 - 1.00 mg/dL   Calcium 9.3 8.9 - 10.3 mg/dL   Total Protein 6.5 6.5 - 8.1 g/dL   Albumin 4.4 3.5 -  5.0 g/dL   AST 22 15 - 41 U/L   ALT 15 14 - 54 U/L   Alkaline Phosphatase 73 38 - 126 U/L   Total Bilirubin 1.1 0.3 - 1.2 mg/dL   GFR calc non Af Amer 55 (L) >60 mL/min   GFR calc Af Amer >60 >60 mL/min    Comment: (NOTE) The eGFR has been calculated using the CKD EPI equation. This calculation has not been validated in all clinical situations. eGFR's persistently <60 mL/min signify possible Chronic Kidney Disease.    Anion gap 11 5 - 15  Ethanol     Status: None   Collection Time: 05/06/17  2:50 PM  Result Value Ref Range   Alcohol, Ethyl (B) <10 <10 mg/dL    Comment:        LOWEST DETECTABLE LIMIT FOR SERUM ALCOHOL IS 10 mg/dL FOR MEDICAL PURPOSES ONLY   Salicylate level     Status: None   Collection Time: 05/06/17  2:50 PM  Result Value Ref Range   Salicylate Lvl <6.3 2.8 - 30.0 mg/dL  Acetaminophen level     Status: Abnormal   Collection Time: 05/06/17  2:50 PM  Result Value Ref Range   Acetaminophen (Tylenol), Serum <10 (L) 10 - 30 ug/mL    Comment:        THERAPEUTIC CONCENTRATIONS VARY SIGNIFICANTLY. A RANGE OF 10-30 ug/mL MAY BE AN EFFECTIVE CONCENTRATION FOR MANY PATIENTS. HOWEVER, SOME ARE BEST TREATED AT CONCENTRATIONS OUTSIDE THIS RANGE. ACETAMINOPHEN CONCENTRATIONS >150 ug/mL AT 4 HOURS AFTER INGESTION AND >50 ug/mL AT 12 HOURS AFTER INGESTION ARE OFTEN ASSOCIATED WITH TOXIC REACTIONS.   cbc     Status: Abnormal   Collection Time: 05/06/17  2:50 PM  Result Value Ref Range   WBC 5.3 4.0 - 10.5 K/uL   RBC 3.75 (L) 3.87 - 5.11 MIL/uL   Hemoglobin 12.8 12.0 - 15.0 g/dL   HCT 37.1 36.0 - 46.0 %   MCV 98.9 78.0 - 100.0 fL   MCH 34.1 (H) 26.0 - 34.0 pg   MCHC 34.5 30.0 - 36.0 g/dL   RDW 15.1 11.5 - 15.5 %   Platelets 208 150 - 400 K/uL  Rapid urine drug screen (hospital performed)     Status: Abnormal   Collection Time: 05/06/17  7:00 PM  Result Value Ref Range   Opiates NONE DETECTED NONE DETECTED   Cocaine NONE DETECTED NONE DETECTED  Benzodiazepines POSITIVE (A) NONE DETECTED   Amphetamines NONE DETECTED NONE DETECTED   Tetrahydrocannabinol NONE DETECTED NONE DETECTED   Barbiturates NONE DETECTED NONE DETECTED    Comment: (NOTE) DRUG SCREEN FOR MEDICAL PURPOSES ONLY.  IF CONFIRMATION IS NEEDED FOR ANY PURPOSE, NOTIFY LAB WITHIN 5 DAYS. LOWEST DETECTABLE LIMITS FOR URINE DRUG SCREEN Drug Class                     Cutoff (ng/mL) Amphetamine and metabolites    1000 Barbiturate and metabolites    200 Benzodiazepine                 026 Tricyclics and metabolites     300 Opiates and metabolites        300 Cocaine and metabolites        300 THC                            50     Current Facility-Administered Medications  Medication Dose Route Frequency Provider Last Rate Last Dose  . acetaminophen (TYLENOL) tablet 325 mg  325 mg Oral Q6H PRN Dorie Rank, MD   325 mg at 05/07/17 0906  . ALPRAZolam Duanne Moron) tablet 0.25 mg  0.25 mg Oral QHS Dorie Rank, MD   0.25 mg at 05/06/17 2115  . aspirin EC tablet 81 mg  81 mg Oral Pete Glatter, MD   81 mg at 05/06/17 2117  . calcium carbonate (OS-CAL - dosed in mg of elemental calcium) tablet 500 mg of elemental calcium  1 tablet Oral Q breakfast Tanna Furry, MD   500 mg of elemental calcium at 05/07/17 0904  . cyanocobalamin tablet 500 mcg  500 mcg Oral Daily Dorie Rank, MD   500 mcg at 05/07/17 3785  . escitalopram (LEXAPRO) tablet 20 mg  20 mg Oral Daily Dorie Rank, MD   20 mg at 05/07/17 0904  . indapamide (LOZOL) tablet 2.5 mg  2.5 mg Oral Once per day on Sun Wed Fri Knapp, Jon, MD   2.5 mg at 05/07/17 8850  . levothyroxine (SYNTHROID, LEVOTHROID) tablet 100 mcg  100 mcg Oral QAC breakfast Dorie Rank, MD   100 mcg at 05/07/17 0904  . metFORMIN (GLUCOPHAGE) tablet 250 mg  250 mg Oral QPC breakfast Dorie Rank, MD   250 mg at 05/07/17 0904  . pantoprazole (PROTONIX) EC tablet 40 mg  40 mg Oral Daily Dorie Rank, MD   40 mg at 05/07/17 0904  . polyethylene glycol (MIRALAX / GLYCOLAX)  packet 17 g  17 g Oral Daily PRN Dorie Rank, MD      . simvastatin (ZOCOR) tablet 20 mg  20 mg Oral Pete Glatter, MD   20 mg at 05/06/17 2115   Current Outpatient Medications  Medication Sig Dispense Refill  . acetaminophen (TYLENOL) 325 MG tablet Take 650 mg by mouth at bedtime.     . ALPRAZolam (XANAX) 0.25 MG tablet Take 0.25 mg by mouth at bedtime.     Marland Kitchen aspirin EC 81 MG tablet Take 81 mg by mouth at bedtime.     . calcium carbonate (OSCAL) 1500 (600 Ca) MG TABS tablet Take 600 mg of elemental calcium by mouth daily.    Marland Kitchen CINNAMON PO Take 1 tablet by mouth daily.     Marland Kitchen docusate sodium (COLACE) 100 MG capsule Take 100 mg by mouth daily.    Marland Kitchen escitalopram (LEXAPRO) 20 MG tablet Take  20 mg by mouth daily.    . Ferrous Gluconate 324 (37.5 Fe) MG TABS Take 324 mg by mouth daily.    . indapamide (LOZOL) 2.5 MG tablet Take 2.5 mg by mouth 3 (three) times a week. Pt takes on Sunday, Wednesday, and Friday.    Marland Kitchen L-Methylfolate-B12-B6-B2 (CEREFOLIN PO) Take 1 tablet by mouth daily.    Marland Kitchen levothyroxine (SYNTHROID, LEVOTHROID) 100 MCG tablet Take 100 mcg by mouth daily before breakfast.    . loratadine (CLARITIN) 10 MG tablet Take 10 mg by mouth daily as needed for allergies.     . metFORMIN (GLUCOPHAGE) 500 MG tablet Take 250 mg by mouth daily after breakfast.     . Multiple Vitamins-Minerals (PRESERVISION AREDS 2) CAPS Take 1 capsule by mouth daily.    . polyethylene glycol (MIRALAX / GLYCOLAX) packet Take 17 g by mouth daily as needed for moderate constipation.    . QUEtiapine (SEROQUEL) 50 MG tablet Take 50 mg by mouth 2 (two) times daily.     . RABEprazole (ACIPHEX) 20 MG tablet Take 20 mg by mouth 2 (two) times daily.     . simvastatin (ZOCOR) 20 MG tablet Take 20 mg by mouth at bedtime.     . vitamin B-12 (CYANOCOBALAMIN) 500 MCG tablet Take 500 mcg by mouth daily.      Musculoskeletal: Strength & Muscle Tone: within normal limits Gait & Station: normal Patient leans: N/A  Psychiatric  Specialty Exam: Physical Exam  Constitutional: She appears well-developed and well-nourished.  HENT:  Head: Normocephalic.  Respiratory: Effort normal.  Neurological: She is alert. She has normal strength. She is disoriented.  Psychiatric: Her speech is normal. Her affect is labile. She is slowed. Thought content is delusional. Cognition and memory are impaired. She expresses impulsivity. She exhibits abnormal recent memory and abnormal remote memory.    Review of Systems  Psychiatric/Behavioral: Positive for memory loss. Negative for depression, hallucinations, substance abuse and suicidal ideas. The patient is not nervous/anxious and does not have insomnia.   All other systems reviewed and are negative.   Blood pressure (!) 143/69, pulse 85, temperature 98.7 F (37.1 C), temperature source Oral, resp. rate 16, height '5\' 3"'$  (1.6 m), weight 61.2 kg (135 lb), SpO2 96 %.Body mass index is 23.91 kg/m.  General Appearance: Casual  Eye Contact:  Good  Speech:  Pressured  Volume:  Increased  Mood:  Frustrated  Affect:  Congruent  Thought Process:  Disorganized  Orientation:  Other:  Not oriented to time person or place  Thought Content:  Illogical  Suicidal Thoughts:  Patient unable to communicate suicidal ideation  Homicidal Thoughts:  Patient unable to communicate homicidal thoughts  Memory:  Negative  Judgement:  Impaired  Insight:  Lacking  Psychomotor Activity:  Normal  Concentration:  Unable  Recall:  Poor  Fund of Knowledge:  Negative  Language:  Good  Akathisia:  No    AIMS (if indicated):     Assets:  Housing Social Support  ADL's:  Impaired  Cognition:  Impaired,  Moderate  Sleep:        Treatment Plan Summary: Daily contact with patient to assess and evaluate symptoms and progress in treatment and Medication management (see MAR)  Disposition: Recommend psychiatric Inpatient admission when medically cleared. TTS to seek placement  Ethelene Hal,  NP 05/07/2017 1:03 PM  Patient seen face-to-face for psychiatric evaluation, chart reviewed and case discussed with the physician extender and developed treatment plan. Reviewed the information documented and agree  with the treatment plan. Corena Pilgrim, MD

## 2017-05-08 DIAGNOSIS — R4587 Impulsiveness: Secondary | ICD-10-CM | POA: Diagnosis not present

## 2017-05-08 DIAGNOSIS — F4325 Adjustment disorder with mixed disturbance of emotions and conduct: Secondary | ICD-10-CM | POA: Diagnosis not present

## 2017-05-08 DIAGNOSIS — Z87891 Personal history of nicotine dependence: Secondary | ICD-10-CM | POA: Diagnosis not present

## 2017-05-08 DIAGNOSIS — F0151 Vascular dementia with behavioral disturbance: Secondary | ICD-10-CM | POA: Diagnosis not present

## 2017-05-08 MED ORDER — HALOPERIDOL LACTATE 5 MG/ML IJ SOLN: 1.0000 mg | Freq: Once | INTRAMUSCULAR | Status: DC

## 2017-05-08 MED ORDER — HALOPERIDOL 1 MG PO TABS
1.0000 mg | ORAL_TABLET | Freq: Once | ORAL | Status: AC
  Administered 2017-05-08: 1 mg via ORAL
  Filled 2017-05-08: qty 1

## 2017-05-08 NOTE — BH Assessment (Signed)
[facility] Assessment Progress Note  Per Buford Dresser, DO, this pt requires psychiatric hospitalization at this time.  Pt presents under IVC initiated by pt's daughter, which Dr Mariea Clonts has upheld.  The following facilities have been contacted to seek placement for this pt, with results as noted:  Beds available, information sent, decision pending:  [place]:  [facility] [place], [place] Coordinator [phone]

## 2017-05-08 NOTE — Consult Note (Signed)
[facility] Consult   Reason for Consult: Dementia  Referring Physician:  EDP Patient Identification: Cristina Middleton MRN:  [id number] Principal Diagnosis: Vascular dementia of acute onset with behavioral disturbance Diagnosis:   Patient Active Problem List   Diagnosis Date Noted  . Adjustment disorder with mixed disturbance of emotions and conduct [F43.25] 05/07/2017  . Vascular dementia of acute onset with behavioral disturbance [F01.51] 05/07/2017  . Anxiety [F41.9] 12/09/2016  . Hyperlipidemia [E78.5] 12/09/2016  . Acute pancreatitis [K85.90] 12/08/2016  . GIB (gastrointestinal bleeding) [K92.2] 06/13/2016  . Bright red blood per rectum [K62.5] 06/13/2016  . Influenza with respiratory manifestation [J11.1] 06/13/2016  . Acute respiratory failure with hypoxia ([place]) [J96.01] 06/13/2016  . Reactive airway disease [J45.909] 06/13/2016  . Varicose veins of left lower extremity with complications [T03.546] 56/81/2751  . SOB (shortness of breath) [R06.02] 06/28/2014  . Abdominal pain [R10.9] 06/28/2014  . Encounter for nasogastric (NG) tube placement [Z46.59]   . SBO (small bowel obstruction) ([place]) [K56.609] 06/27/2014  . Diabetes mellitus without complication ([place]) [Z00.1] 06/27/2014  . Acute blood loss anemia [D62] 05/26/2012  . HTN (hypertension) [I10] 05/25/2012  . Diabetes mellitus, type 2 ([place]) [E11.9] 05/25/2012  . Osteoarthritis of right knee [M17.11] 05/25/2012  . Sleep apnea [G47.30] 05/25/2012  . GERD (gastroesophageal reflux disease) [K21.9] 05/25/2012  . Hypothyroidism [E03.9] 05/25/2012    Total Time spent with patient: 45 minutes  HPI:   Patient is a 84 y.o. female patient admitted with acute recent behavioral changes noted over the last couple of months. Patient presented to the emergency room yesterday for a progressively worsening change in her mental status.  Patient's son wants to be safe as he feels that his mother needs a higher level of care  than he can presently provide at home. Patient has been becoming increasingly agitated and aggressive at times.  Her manner speech is also changed and that she is very loud and deliberate.  She also seems to become angry at times while speaking.  She has had prior violence towards individuals intermittently at home. At other times, she will be very apologetic and speaks in a more subdued manner. Patient's symptoms have been progressively getting worse. Patient is much more oriented toward person place and time today than she was yesterday. Patient is unsure why she is here in the hospital. Patient denies suicidal ideation, thoughts of hurting herself or others and feels safe at home.      Past Psychiatric History: Patient is being treated for Anxiety by Dr. Maudie Mercury.  Risk to Self: Suicidal Ideation: (UTA) Suicidal Intent: (UTA) Is patient at risk for suicide?: (UTA) Suicidal Plan?: (UTA) Access to Means: (UTA) What has been your use of drugs/alcohol within the last 12 months?: (UTA) How many times?: (UTA) Other Self Harm Risks: (UTA) Triggers for Past Attempts: (UTA) Intentional Self Injurious Behavior: (UTA) Risk to Others: Homicidal Ideation: (UTA) Thoughts of Harm to Others: (UTA) Current Homicidal Intent: (UTA) Current Homicidal Plan: (UTA) Access to Homicidal Means: (UTA) Identified Victim: (UTA) History of harm to others?: (UTA) Assessment of Violence: (UTA) Violent Behavior Description: (UTA) Does patient have access to weapons?: (UTA) Criminal Charges Pending?: No Does patient have a court date: No Prior Inpatient Therapy: Prior Inpatient Therapy: No Prior Therapy Dates: NA Prior Therapy Facilty/Provider(s): NA Reason for Treatment: NA Prior Outpatient Therapy: Prior Outpatient Therapy: (UTA) Prior Therapy Dates: (UTA) Prior Therapy Facilty/Provider(s): (UTA) Reason for Treatment: (UTA) Does patient have an ACCT team?: Unknown Does patient have Intensive In-House  Services?  :  Unknown Does patient have [facility] services? : Unknown Does patient have P4[facility] services?: Unknown  Past Medical History:  Past Medical History:  Diagnosis Date  . Anxiety   . Arthritis   . Diabetes mellitus without complication ([place])   . Dislocated shoulder    WAS POPPED BACK IN PLACE  2006???  . GERD (gastroesophageal reflux disease)    TAKES  ACIPHEX  . Hyperlipidemia   . Hypertension   . Hyperthyroidism    NOT SURE WHICH ONE  . PONV (postoperative nausea and vomiting)    YRS AGO.....(6-7 YRS)  . Sleep apnea    WEARS MASK EVERY NOW AND THEN    Past Surgical History:  Procedure Laterality Date  . BREAST SURGERY     CYST REMOVED -- LEFT  . ENDOVENOUS ABLATION SAPHENOUS VEIN W/ LASER Left 07/11/2016   endovenous laser ablation left greater saphenous vein by Tinnie Gens MD  . [place]  . LIPOMA EXCISION     FROM LEFT BACK  . TONSILLECTOMY    . TOTAL KNEE ARTHROPLASTY  05/25/2012   right knee  . TOTAL KNEE ARTHROPLASTY  05/25/2012   Procedure: TOTAL KNEE ARTHROPLASTY;  Surgeon: Yvette Rack., MD;  Location: [facility];  Service: Orthopedics;  Laterality: Right;   Family History:  Family History  Problem Relation Age of Onset  . Other Mother   . Other Father   . Breast cancer Sister   . Colon cancer Sister   . CAD Sister   . Marfan syndrome Sister   . CAD Brother   . Marfan syndrome Brother      Social History:  Social History   Substance and Sexual Activity  Alcohol Use No     Social History   Substance and Sexual Activity  Drug Use No    Social History   Socioeconomic History  . Marital status: Widowed    Spouse name: None  . Number of children: None  . Years of education: None  . Highest education level: None  Social Needs  . Financial resource strain: None  . Food insecurity - worry: None  . Food insecurity - inability: None  . Transportation needs - medical: None  . Transportation needs - non-medical: None  Occupational  History  . None  Tobacco Use  . Smoking status: Former Smoker    Packs/day: 1.00    Types: Cigarettes    Last attempt to quit: 05/02/1998    Years since quitting: 19.0  . Smokeless tobacco: Never Used  Substance and Sexual Activity  . Alcohol use: No  . Drug use: No  . Sexual activity: No  Other Topics Concern  . None  Social History Narrative  . None     Allergies:   Allergies  Allergen Reactions  . Other Other (See Comments)    Pt is allergic to Alka-Seltzer tablets.   Reaction:  Hallucinations   . Penicillins Hives, Itching, Swelling and Other (See Comments)    Reaction:  Unspecified swelling reaction Has patient had a PCN reaction causing immediate rash, facial/tongue/throat swelling, SOB or lightheadedness with hypotension: Yes Has patient had a PCN reaction causing severe rash involving mucus membranes or skin necrosis: No Has patient had a PCN reaction that required hospitalization No Has patient had a PCN reaction occurring within the last 10 years: No If all of the above answers are "NO", then may proceed with Cephalosporin use.  . Sulfa Antibiotics Hives, Itching,  Swelling and Other (See Comments)    Reaction:  Unspecified swelling reaction    Labs:  Results for orders placed or performed during the hospital encounter of 05/06/17 (from the past 48 hour(s))  Comprehensive metabolic panel     Status: Abnormal   Collection Time: 05/06/17  2:50 PM  Result Value Ref Range   Sodium 136 135 - 145 mmol/L   Potassium 3.5 3.5 - 5.1 mmol/L   Chloride 99 (L) 101 - 111 mmol/L   CO2 26 22 - 32 mmol/L   Glucose, Bld 86 65 - 99 mg/dL   BUN 14 6 - 20 mg/dL   Creatinine, Ser 0.88 0.44 - 1.00 mg/dL   Calcium 9.3 8.9 - 10.3 mg/dL   Total Protein 6.5 6.5 - 8.1 g/dL   Albumin 4.4 3.5 - 5.0 g/dL   AST 22 15 - 41 U/L   ALT 15 14 - 54 U/L   Alkaline Phosphatase 73 38 - 126 U/L   Total Bilirubin 1.1 0.3 - 1.2 mg/dL   GFR calc non Af Amer 55 (L) >60 mL/min   GFR calc Af Amer >60  >60 mL/min    Comment: (NOTE) The eGFR has been calculated using the CKD EPI equation. This calculation has not been validated in all clinical situations. eGFR's persistently <60 mL/min signify possible Chronic Kidney Disease.    Anion gap 11 5 - 15  Ethanol     Status: None   Collection Time: 05/06/17  2:50 PM  Result Value Ref Range   Alcohol, Ethyl (B) <10 <10 mg/dL    Comment:        LOWEST DETECTABLE LIMIT FOR SERUM ALCOHOL IS 10 mg/dL FOR MEDICAL PURPOSES ONLY   Salicylate level     Status: None   Collection Time: 05/06/17  2:50 PM  Result Value Ref Range   Salicylate Lvl <4.7 2.8 - 30.0 mg/dL  Acetaminophen level     Status: Abnormal   Collection Time: 05/06/17  2:50 PM  Result Value Ref Range   Acetaminophen (Tylenol), Serum <10 (L) 10 - 30 ug/mL    Comment:        THERAPEUTIC CONCENTRATIONS VARY SIGNIFICANTLY. A RANGE OF 10-30 ug/mL MAY BE AN EFFECTIVE CONCENTRATION FOR MANY PATIENTS. HOWEVER, SOME ARE BEST TREATED AT CONCENTRATIONS OUTSIDE THIS RANGE. ACETAMINOPHEN CONCENTRATIONS >150 ug/mL AT 4 HOURS AFTER INGESTION AND >50 ug/mL AT 12 HOURS AFTER INGESTION ARE OFTEN ASSOCIATED WITH TOXIC REACTIONS.   cbc     Status: Abnormal   Collection Time: 05/06/17  2:50 PM  Result Value Ref Range   WBC 5.3 4.0 - 10.5 K/uL   RBC 3.75 (L) 3.87 - 5.11 MIL/uL   Hemoglobin 12.8 12.0 - 15.0 g/dL   HCT 37.1 36.0 - 46.0 %   MCV 98.9 78.0 - 100.0 fL   MCH 34.1 (H) 26.0 - 34.0 pg   MCHC 34.5 30.0 - 36.0 g/dL   RDW 15.1 11.5 - 15.5 %   Platelets 208 150 - 400 K/uL  Rapid urine drug screen (hospital performed)     Status: Abnormal   Collection Time: 05/06/17  7:00 PM  Result Value Ref Range   Opiates NONE DETECTED NONE DETECTED   Cocaine NONE DETECTED NONE DETECTED   Benzodiazepines POSITIVE (A) NONE DETECTED   Amphetamines NONE DETECTED NONE DETECTED   Tetrahydrocannabinol NONE DETECTED NONE DETECTED   Barbiturates NONE DETECTED NONE DETECTED    Comment: (NOTE) DRUG  SCREEN FOR MEDICAL PURPOSES ONLY.  IF CONFIRMATION IS NEEDED  FOR ANY PURPOSE, NOTIFY LAB WITHIN 5 DAYS. LOWEST DETECTABLE LIMITS FOR URINE DRUG SCREEN Drug Class                     Cutoff (ng/mL) Amphetamine and metabolites    1000 Barbiturate and metabolites    200 Benzodiazepine                 884 Tricyclics and metabolites     300 Opiates and metabolites        300 Cocaine and metabolites        300 THC                            50   Urinalysis, Complete w Microscopic     Status: Abnormal   Collection Time: 05/07/17 11:30 AM  Result Value Ref Range   Color, Urine YELLOW YELLOW   APPearance CLEAR CLEAR   Specific Gravity, Urine 1.010 1.005 - 1.030   pH 7.0 5.0 - 8.0   Glucose, UA NEGATIVE NEGATIVE mg/dL   Hgb urine dipstick NEGATIVE NEGATIVE   Bilirubin Urine NEGATIVE NEGATIVE   Ketones, ur NEGATIVE NEGATIVE mg/dL   Protein, ur NEGATIVE NEGATIVE mg/dL   Nitrite NEGATIVE NEGATIVE   Leukocytes, UA TRACE (A) NEGATIVE   RBC / HPF 0-5 0 - 5 RBC/hpf   WBC, UA 0-5 0 - 5 WBC/hpf   Bacteria, UA RARE (A) NONE SEEN   Squamous Epithelial / LPF 0-5 (A) NONE SEEN   Mucus PRESENT     Current Facility-Administered Medications  Medication Dose Route Frequency Provider Last Rate Last Dose  . acetaminophen (TYLENOL) tablet 325 mg  325 mg Oral Q6H PRN Dorie Rank, MD   325 mg at 05/07/17 0906  . ALPRAZolam Duanne Moron) tablet 0.25 mg  0.25 mg Oral QHS Dorie Rank, MD   0.25 mg at 05/07/17 2136  . aspirin EC tablet 81 mg  81 mg Oral QHS Dorie Rank, MD   81 mg at 05/07/17 2136  . calcium carbonate (OS-CAL - dosed in mg of elemental calcium) tablet 500 mg of elemental calcium  1 tablet Oral Q breakfast Tanna Furry, MD   500 mg of elemental calcium at 05/08/17 0750  . cyanocobalamin tablet 500 mcg  500 mcg Oral Daily Dorie Rank, MD   500 mcg at 05/08/17 0753  . escitalopram (LEXAPRO) tablet 20 mg  20 mg Oral Daily Dorie Rank, MD   20 mg at 05/08/17 0753  . haloperidol (HALDOL) tablet 1 mg  1 mg  Oral Once Ethelene Hal, NP      . indapamide (LOZOL) tablet 2.5 mg  2.5 mg Oral Once per day on Sun Wed Fri Knapp, Jon, MD   2.5 mg at 05/07/17 1660  . levothyroxine (SYNTHROID, LEVOTHROID) tablet 100 mcg  100 mcg Oral QAC breakfast Dorie Rank, MD   100 mcg at 05/08/17 0750  . metFORMIN (GLUCOPHAGE) tablet 250 mg  250 mg Oral QPC breakfast Dorie Rank, MD   250 mg at 05/08/17 0752  . pantoprazole (PROTONIX) EC tablet 40 mg  40 mg Oral Daily Dorie Rank, MD   40 mg at 05/08/17 0752  . polyethylene glycol (MIRALAX / GLYCOLAX) packet 17 g  17 g Oral Daily PRN Dorie Rank, MD      . simvastatin (ZOCOR) tablet 20 mg  20 mg Oral Pete Glatter, MD   20 mg at 05/07/17 2136   Current Outpatient  Medications  Medication Sig Dispense Refill  . acetaminophen (TYLENOL) 325 MG tablet Take 650 mg by mouth at bedtime.     . ALPRAZolam (XANAX) 0.25 MG tablet Take 0.25 mg by mouth at bedtime.     Marland Kitchen aspirin EC 81 MG tablet Take 81 mg by mouth at bedtime.     . calcium carbonate (OSCAL) 1500 (600 Ca) MG TABS tablet Take 600 mg of elemental calcium by mouth daily.    Marland Kitchen CINNAMON PO Take 1 tablet by mouth daily.     Marland Kitchen docusate sodium (COLACE) 100 MG capsule Take 100 mg by mouth daily.    Marland Kitchen escitalopram (LEXAPRO) 20 MG tablet Take 20 mg by mouth daily.    . Ferrous Gluconate 324 (37.5 Fe) MG TABS Take 324 mg by mouth daily.    . indapamide (LOZOL) 2.5 MG tablet Take 2.5 mg by mouth 3 (three) times a week. Pt takes on Sunday, Wednesday, and Friday.    Marland Kitchen L-Methylfolate-B12-B6-B2 (CEREFOLIN PO) Take 1 tablet by mouth daily.    Marland Kitchen levothyroxine (SYNTHROID, LEVOTHROID) 100 MCG tablet Take 100 mcg by mouth daily before breakfast.    . loratadine (CLARITIN) 10 MG tablet Take 10 mg by mouth daily as needed for allergies.     . metFORMIN (GLUCOPHAGE) 500 MG tablet Take 250 mg by mouth daily after breakfast.     . Multiple Vitamins-Minerals (PRESERVISION AREDS 2) CAPS Take 1 capsule by mouth daily.    . polyethylene  glycol (MIRALAX / GLYCOLAX) packet Take 17 g by mouth daily as needed for moderate constipation.    . QUEtiapine (SEROQUEL) 50 MG tablet Take 50 mg by mouth 2 (two) times daily.     . RABEprazole (ACIPHEX) 20 MG tablet Take 20 mg by mouth 2 (two) times daily.     . simvastatin (ZOCOR) 20 MG tablet Take 20 mg by mouth at bedtime.     . vitamin B-12 (CYANOCOBALAMIN) 500 MCG tablet Take 500 mcg by mouth daily.      Musculoskeletal: Strength & Muscle Tone: within normal limits Gait & Station: normal Patient leans: N/A  Psychiatric Specialty Exam: Physical Exam  Nursing note and vitals reviewed. Constitutional: She appears well-developed and well-nourished.  HENT:  Head: Normocephalic and atraumatic.  Respiratory: Effort normal.  Neurological: She is alert. She has normal strength.  Psychiatric: Her speech is normal. Her affect is labile. She is slowed. Thought content is delusional. Cognition and memory are impaired. She expresses impulsivity. She exhibits abnormal recent memory and abnormal remote memory.    Review of Systems  Psychiatric/Behavioral: Positive for memory loss. Negative for depression, hallucinations, substance abuse and suicidal ideas. The patient is not nervous/anxious and does not have insomnia.   All other systems reviewed and are negative.   Blood pressure 134/65, pulse 82, temperature 97.6 F (36.4 C), temperature source Oral, resp. rate 18, height '5\' 3"'  (1.6 m), weight 61.2 kg (135 lb), SpO2 100 %.Body mass index is 23.91 kg/m.  General Appearance: Casual  Eye Contact:  Good  Speech:  Pressured  Volume:  Increased  Mood:  Frustrated  Affect:  Congruent  Thought Process:  Disorganized  Orientation:  Other:  Not oriented to time person or place  Thought Content:  Illogical  Suicidal Thoughts:  Patient unable to communicate suicidal ideation  Homicidal Thoughts:  Patient unable to communicate homicidal thoughts  Memory:  Negative  Judgement:  Impaired   Insight:  Lacking  Psychomotor Activity:  Normal  Concentration:  Unable  Recall:  Poor  Fund of Knowledge:  Negative  Language:  Good  Akathisia:  No    AIMS (if indicated):   N/A  Assets:  Housing Social Support  ADL's:  Impaired  Cognition:  Impaired,  Moderate  Sleep:   N/A     Treatment Plan Summary: Daily contact with patient to assess and evaluate symptoms and progress in treatment and Medication management (see MAR)  Disposition: Recommend psychiatric Inpatient admission when medically cleared. TTS to seek placement  Ethelene Hal, NP 05/08/2017 12:26 PM   Patient seen face-to-face for psychiatric evaluation, chart reviewed and case discussed with the physician extender and developed treatment plan. Reviewed the information documented and agree with the treatment plan.  Buford Dresser, DO

## 2017-05-08 NOTE — ED Notes (Signed)
Report given to FLOOR RN 

## 2017-05-08 NOTE — ED Notes (Signed)
Patient clothing is in the top drawer in patient room TCU 26

## 2017-05-08 NOTE — Consult Note (Deleted)
[facility] Consult   Reason for Consult:  Dementia Referring Physician:  EDP Patient Identification: Cristina Middleton MRN:  [id number] Principal Diagnosis: Vascular dementia of acute onset with behavioral disturbance Diagnosis:   Patient Active Problem List   Diagnosis Date Noted  . Adjustment disorder with mixed disturbance of emotions and conduct [F43.25] 05/07/2017  . Vascular dementia of acute onset with behavioral disturbance [F01.51] 05/07/2017  . Anxiety [F41.9] 12/09/2016  . Hyperlipidemia [E78.5] 12/09/2016  . Acute pancreatitis [K85.90] 12/08/2016  . GIB (gastrointestinal bleeding) [K92.2] 06/13/2016  . Bright red blood per rectum [K62.5] 06/13/2016  . Influenza with respiratory manifestation [J11.1] 06/13/2016  . Acute respiratory failure with hypoxia ([place]) [J96.01] 06/13/2016  . Reactive airway disease [J45.909] 06/13/2016  . Varicose veins of left lower extremity with complications [K56.256] 38/93/7342  . SOB (shortness of breath) [R06.02] 06/28/2014  . Abdominal pain [R10.9] 06/28/2014  . Encounter for nasogastric (NG) tube placement [Z46.59]   . SBO (small bowel obstruction) ([place]) [K56.609] 06/27/2014  . Diabetes mellitus without complication ([place]) [A76.8] 06/27/2014  . Acute blood loss anemia [D62] 05/26/2012  . HTN (hypertension) [I10] 05/25/2012  . Diabetes mellitus, type 2 ([place]) [E11.9] 05/25/2012  . Osteoarthritis of right knee [M17.11] 05/25/2012  . Sleep apnea [G47.30] 05/25/2012  . GERD (gastroesophageal reflux disease) [K21.9] 05/25/2012  . Hypothyroidism [E03.9] 05/25/2012    Total Time spent with patient: 45 minutes   HPI:  Patient  is a 84 y.o. female patient admitted to the emergency room for increasing changes in her mental status over the last 2 months. Patient's son wants to be safe as he feels that his mother needs a higher level of care than he can presently provide at home. History was provided by the patient's son. Patient's son  states that starting in December is when they first noted her symptoms.  At that time she was living by herself since then patient's son and daughter have been staying with her and watching her.  They also have home caregivers to stay with the patient 24 hours a day.  Patient has been getting agitated and aggressive at times.  Her manner speech is also changed and that she is very loud and deliberate.  She also seems to get angry while speaking.  She has been violent towards individuals intermittently.  At other times, she will be very apologetic and speaks in a more subdued manner.  Patient's symptoms have been progressively getting worse.  They went to see the primary care doctor this past week.  He felt like her symptoms were likely related to dementia.   Past Psychiatric History: As above  Risk to Self: None Risk to Others: None Prior Inpatient Therapy: Prior Inpatient Therapy: No Prior Therapy Dates: NA Prior Therapy Facilty/Provider(s): NA Reason for Treatment: NA Prior Outpatient Therapy: Prior Outpatient Therapy: (UTA) Prior Therapy Dates: (UTA) Prior Therapy Facilty/Provider(s): (UTA) Reason for Treatment: (UTA) Does patient have an ACCT team?: Unknown Does patient have Intensive In-House Services?  : Unknown Does patient have [facility] services? : Unknown Does patient have P4CC services?: Unknown  Past Medical History:  Past Medical History:  Diagnosis Date  . Anxiety   . Arthritis   . Diabetes mellitus without complication ([place])   . Dislocated shoulder    WAS POPPED BACK IN PLACE  2006???  . GERD (gastroesophageal reflux disease)    TAKES  ACIPHEX  . Hyperlipidemia   . Hypertension   . Hyperthyroidism    NOT SURE WHICH ONE  .  PONV (postoperative nausea and vomiting)    YRS AGO.....(6-7 YRS)  . Sleep apnea    WEARS MASK EVERY NOW AND THEN    Past Surgical History:  Procedure Laterality Date  . BREAST SURGERY     CYST REMOVED -- LEFT  . ENDOVENOUS ABLATION SAPHENOUS  VEIN W/ LASER Left 07/11/2016   endovenous laser ablation left greater saphenous vein by Tinnie Gens MD  . [place]  . LIPOMA EXCISION     FROM LEFT BACK  . TONSILLECTOMY    . TOTAL KNEE ARTHROPLASTY  05/25/2012   right knee  . TOTAL KNEE ARTHROPLASTY  05/25/2012   Procedure: TOTAL KNEE ARTHROPLASTY;  Surgeon: Yvette Rack., MD;  Location: [facility];  Service: Orthopedics;  Laterality: Right;   Family History:  Family History  Problem Relation Age of Onset  . Other Mother   . Other Father   . Breast cancer Sister   . Colon cancer Sister   . CAD Sister   . Marfan syndrome Sister   . CAD Brother   . Marfan syndrome Brother    Family Psychiatric  History: Unknown Social History:  Social History   Substance and Sexual Activity  Alcohol Use No     Social History   Substance and Sexual Activity  Drug Use No    Social History   Socioeconomic History  . Marital status: Widowed    Spouse name: None  . Number of children: None  . Years of education: None  . Highest education level: None  Social Needs  . Financial resource strain: None  . Food insecurity - worry: None  . Food insecurity - inability: None  . Transportation needs - medical: None  . Transportation needs - non-medical: None  Occupational History  . None  Tobacco Use  . Smoking status: Former Smoker    Packs/day: 1.00    Types: Cigarettes    Last attempt to quit: 05/02/1998    Years since quitting: 19.0  . Smokeless tobacco: Never Used  Substance and Sexual Activity  . Alcohol use: No  . Drug use: No  . Sexual activity: No  Other Topics Concern  . None  Social History Narrative  . None   Additional Social History:    Allergies:   Allergies  Allergen Reactions  . Other Other (See Comments)    Pt is allergic to Alka-Seltzer tablets.   Reaction:  Hallucinations   . Penicillins Hives, Itching, Swelling and Other (See Comments)    Reaction:  Unspecified swelling reaction Has  patient had a PCN reaction causing immediate rash, facial/tongue/throat swelling, SOB or lightheadedness with hypotension: Yes Has patient had a PCN reaction causing severe rash involving mucus membranes or skin necrosis: No Has patient had a PCN reaction that required hospitalization No Has patient had a PCN reaction occurring within the last 10 years: No If all of the above answers are "NO", then may proceed with Cephalosporin use.  . Sulfa Antibiotics Hives, Itching, Swelling and Other (See Comments)    Reaction:  Unspecified swelling reaction    Labs:  Results for orders placed or performed during the hospital encounter of 05/06/17 (from the past 48 hour(s))  Comprehensive metabolic panel     Status: Abnormal   Collection Time: 05/06/17  2:50 PM  Result Value Ref Range   Sodium 136 135 - 145 mmol/L   Potassium 3.5 3.5 - 5.1 mmol/L   Chloride 99 (L) 101 - 111 mmol/L  CO2 26 22 - 32 mmol/L   Glucose, Bld 86 65 - 99 mg/dL   BUN 14 6 - 20 mg/dL   Creatinine, Ser 0.88 0.44 - 1.00 mg/dL   Calcium 9.3 8.9 - 10.3 mg/dL   Total Protein 6.5 6.5 - 8.1 g/dL   Albumin 4.4 3.5 - 5.0 g/dL   AST 22 15 - 41 U/L   ALT 15 14 - 54 U/L   Alkaline Phosphatase 73 38 - 126 U/L   Total Bilirubin 1.1 0.3 - 1.2 mg/dL   GFR calc non Af Amer 55 (L) >60 mL/min   GFR calc Af Amer >60 >60 mL/min    Comment: (NOTE) The eGFR has been calculated using the CKD EPI equation. This calculation has not been validated in all clinical situations. eGFR's persistently <60 mL/min signify possible Chronic Kidney Disease.    Anion gap 11 5 - 15  Ethanol     Status: None   Collection Time: 05/06/17  2:50 PM  Result Value Ref Range   Alcohol, Ethyl (B) <10 <10 mg/dL    Comment:        LOWEST DETECTABLE LIMIT FOR SERUM ALCOHOL IS 10 mg/dL FOR MEDICAL PURPOSES ONLY   Salicylate level     Status: None   Collection Time: 05/06/17  2:50 PM  Result Value Ref Range   Salicylate Lvl <9.5 2.8 - 30.0 mg/dL   Acetaminophen level     Status: Abnormal   Collection Time: 05/06/17  2:50 PM  Result Value Ref Range   Acetaminophen (Tylenol), Serum <10 (L) 10 - 30 ug/mL    Comment:        THERAPEUTIC CONCENTRATIONS VARY SIGNIFICANTLY. A RANGE OF 10-30 ug/mL MAY BE AN EFFECTIVE CONCENTRATION FOR MANY PATIENTS. HOWEVER, SOME ARE BEST TREATED AT CONCENTRATIONS OUTSIDE THIS RANGE. ACETAMINOPHEN CONCENTRATIONS >150 ug/mL AT 4 HOURS AFTER INGESTION AND >50 ug/mL AT 12 HOURS AFTER INGESTION ARE OFTEN ASSOCIATED WITH TOXIC REACTIONS.   cbc     Status: Abnormal   Collection Time: 05/06/17  2:50 PM  Result Value Ref Range   WBC 5.3 4.0 - 10.5 K/uL   RBC 3.75 (L) 3.87 - 5.11 MIL/uL   Hemoglobin 12.8 12.0 - 15.0 g/dL   HCT 37.1 36.0 - 46.0 %   MCV 98.9 78.0 - 100.0 fL   MCH 34.1 (H) 26.0 - 34.0 pg   MCHC 34.5 30.0 - 36.0 g/dL   RDW 15.1 11.5 - 15.5 %   Platelets 208 150 - 400 K/uL  Rapid urine drug screen (hospital performed)     Status: Abnormal   Collection Time: 05/06/17  7:00 PM  Result Value Ref Range   Opiates NONE DETECTED NONE DETECTED   Cocaine NONE DETECTED NONE DETECTED   Benzodiazepines POSITIVE (A) NONE DETECTED   Amphetamines NONE DETECTED NONE DETECTED   Tetrahydrocannabinol NONE DETECTED NONE DETECTED   Barbiturates NONE DETECTED NONE DETECTED    Comment: (NOTE) DRUG SCREEN FOR MEDICAL PURPOSES ONLY.  IF CONFIRMATION IS NEEDED FOR ANY PURPOSE, NOTIFY LAB WITHIN 5 DAYS. LOWEST DETECTABLE LIMITS FOR URINE DRUG SCREEN Drug Class                     Cutoff (ng/mL) Amphetamine and metabolites    1000 Barbiturate and metabolites    200 Benzodiazepine                 284 Tricyclics and metabolites     300 Opiates and metabolites  300 Cocaine and metabolites        300 THC                            50   Urinalysis, Complete w Microscopic     Status: Abnormal   Collection Time: 05/07/17 11:30 AM  Result Value Ref Range   Color, Urine YELLOW YELLOW   APPearance  CLEAR CLEAR   Specific Gravity, Urine 1.010 1.005 - 1.030   pH 7.0 5.0 - 8.0   Glucose, UA NEGATIVE NEGATIVE mg/dL   Hgb urine dipstick NEGATIVE NEGATIVE   Bilirubin Urine NEGATIVE NEGATIVE   Ketones, ur NEGATIVE NEGATIVE mg/dL   Protein, ur NEGATIVE NEGATIVE mg/dL   Nitrite NEGATIVE NEGATIVE   Leukocytes, UA TRACE (A) NEGATIVE   RBC / HPF 0-5 0 - 5 RBC/hpf   WBC, UA 0-5 0 - 5 WBC/hpf   Bacteria, UA RARE (A) NONE SEEN   Squamous Epithelial / LPF 0-5 (A) NONE SEEN   Mucus PRESENT     Current Facility-Administered Medications  Medication Dose Route Frequency Provider Last Rate Last Dose  . acetaminophen (TYLENOL) tablet 325 mg  325 mg Oral Q6H PRN Dorie Rank, MD   325 mg at 05/07/17 0906  . ALPRAZolam Duanne Moron) tablet 0.25 mg  0.25 mg Oral QHS Dorie Rank, MD   0.25 mg at 05/07/17 2136  . aspirin EC tablet 81 mg  81 mg Oral QHS Dorie Rank, MD   81 mg at 05/07/17 2136  . calcium carbonate (OS-CAL - dosed in mg of elemental calcium) tablet 500 mg of elemental calcium  1 tablet Oral Q breakfast Tanna Furry, MD   500 mg of elemental calcium at 05/08/17 0750  . cyanocobalamin tablet 500 mcg  500 mcg Oral Daily Dorie Rank, MD   500 mcg at 05/08/17 0753  . escitalopram (LEXAPRO) tablet 20 mg  20 mg Oral Daily Dorie Rank, MD   20 mg at 05/08/17 0753  . haloperidol (HALDOL) tablet 1 mg  1 mg Oral Once Ethelene Hal, NP      . indapamide (LOZOL) tablet 2.5 mg  2.5 mg Oral Once per day on Sun Wed Fri Knapp, Jon, MD   2.5 mg at 05/07/17 9767  . levothyroxine (SYNTHROID, LEVOTHROID) tablet 100 mcg  100 mcg Oral QAC breakfast Dorie Rank, MD   100 mcg at 05/08/17 0750  . metFORMIN (GLUCOPHAGE) tablet 250 mg  250 mg Oral QPC breakfast Dorie Rank, MD   250 mg at 05/08/17 0752  . pantoprazole (PROTONIX) EC tablet 40 mg  40 mg Oral Daily Dorie Rank, MD   40 mg at 05/08/17 0752  . polyethylene glycol (MIRALAX / GLYCOLAX) packet 17 g  17 g Oral Daily PRN Dorie Rank, MD      . simvastatin (ZOCOR) tablet  20 mg  20 mg Oral Pete Glatter, MD   20 mg at 05/07/17 2136   Current Outpatient Medications  Medication Sig Dispense Refill  . acetaminophen (TYLENOL) 325 MG tablet Take 650 mg by mouth at bedtime.     . ALPRAZolam (XANAX) 0.25 MG tablet Take 0.25 mg by mouth at bedtime.     Marland Kitchen aspirin EC 81 MG tablet Take 81 mg by mouth at bedtime.     . calcium carbonate (OSCAL) 1500 (600 Ca) MG TABS tablet Take 600 mg of elemental calcium by mouth daily.    Marland Kitchen CINNAMON PO Take 1 tablet by mouth  daily.     . docusate sodium (COLACE) 100 MG capsule Take 100 mg by mouth daily.    Marland Kitchen escitalopram (LEXAPRO) 20 MG tablet Take 20 mg by mouth daily.    . Ferrous Gluconate 324 (37.5 Fe) MG TABS Take 324 mg by mouth daily.    . indapamide (LOZOL) 2.5 MG tablet Take 2.5 mg by mouth 3 (three) times a week. Pt takes on Sunday, Wednesday, and Friday.    Marland Kitchen L-Methylfolate-B12-B6-B2 (CEREFOLIN PO) Take 1 tablet by mouth daily.    Marland Kitchen levothyroxine (SYNTHROID, LEVOTHROID) 100 MCG tablet Take 100 mcg by mouth daily before breakfast.    . loratadine (CLARITIN) 10 MG tablet Take 10 mg by mouth daily as needed for allergies.     . metFORMIN (GLUCOPHAGE) 500 MG tablet Take 250 mg by mouth daily after breakfast.     . Multiple Vitamins-Minerals (PRESERVISION AREDS 2) CAPS Take 1 capsule by mouth daily.    . polyethylene glycol (MIRALAX / GLYCOLAX) packet Take 17 g by mouth daily as needed for moderate constipation.    . QUEtiapine (SEROQUEL) 50 MG tablet Take 50 mg by mouth 2 (two) times daily.     . RABEprazole (ACIPHEX) 20 MG tablet Take 20 mg by mouth 2 (two) times daily.     . simvastatin (ZOCOR) 20 MG tablet Take 20 mg by mouth at bedtime.     . vitamin B-12 (CYANOCOBALAMIN) 500 MCG tablet Take 500 mcg by mouth daily.      Musculoskeletal: Strength & Muscle Tone: within normal limits Gait & Station: normal Patient leans: N/A  Psychiatric Specialty Exam: Physical Exam  ROS  Blood pressure 134/65, pulse 82, temperature  97.6 F (36.4 C), temperature source Oral, resp. rate 18, height _0  (1.6 m), weight 61.2 kg (135 lb), SpO2 100 %.Body mass index is 23.91 kg/m.  General Appearance: Casual  Eye Contact:  Good  Speech:  Clear and Coherent  Volume:  Normal  Mood:  Anxious and Depressed  Affect:  Congruent and Depressed  Thought Process:  Disorganized  Orientation:  Other:  person, place  Thought Content:  UTA dementia  Suicidal Thoughts:  No  Homicidal Thoughts:  No  Memory:  Immediate;   Fair Recent;   Poor Remote;   Poor  Judgement:  Other:  UTA dementia  Insight:  UTA dementia  Psychomotor Activity:  Normal  Concentration:  Concentration: Fair and Attention Span: Fair  Recall:  [employer] of Knowledge:  Fair  Language:  Good  Akathisia:  No  Handed:  Right  AIMS (if indicated):     Assets:  Agricultural consultant Housing Social Support  ADL's:  Intact  Cognition:  Impaired,  Severe  Sleep:        Treatment Plan Summary: Daily contact with patient to assess and evaluate symptoms and progress in treatment and Medication management (see MAR)  Disposition: Recommend psychiatric Inpatient admission when medically cleared.  Ethelene Hal, NP 05/08/2017 12:21 PM

## 2017-05-09 DIAGNOSIS — F0151 Vascular dementia with behavioral disturbance: Secondary | ICD-10-CM | POA: Diagnosis not present

## 2017-05-09 MED ORDER — ZIPRASIDONE MESYLATE 20 MG IM SOLR
INTRAMUSCULAR | Status: AC
  Filled 2017-05-09: qty 20

## 2017-05-09 MED ORDER — ZIPRASIDONE MESYLATE 20 MG IM SOLR
10.0000 mg | Freq: Once | INTRAMUSCULAR | Status: AC
  Administered 2017-05-09: 10 mg via INTRAMUSCULAR

## 2017-05-09 MED ORDER — STERILE WATER FOR INJECTION IJ SOLN
1.5000 mL | Freq: Once | INTRAMUSCULAR | Status: AC
  Administered 2017-05-09 – 2017-05-10 (×2): 1.5 mL via INTRAMUSCULAR

## 2017-05-09 MED ORDER — STERILE WATER FOR INJECTION IJ SOLN
INTRAMUSCULAR | Status: AC
  Administered 2017-05-10: 1.5 mL via INTRAMUSCULAR
  Filled 2017-05-09: qty 10

## 2017-05-09 NOTE — BH Assessment (Signed)
[facility] Assessment Progress Note  Per Buford Dresser, DO, this pt continues to require psychiatric hospitalization at this time.  The following facilities have been contacted to seek placement for this pt, with results as noted:  Beds available, information sent, decision pending:  [facility]   At capacity:  [facility]   Pt is still under consideration at [facility].   Jalene Mullet, [place] Coordinator [phone]

## 2017-05-09 NOTE — BH Assessment (Signed)
[place]  Assessment Progress Note  Pt reassessed today. Pt is noted to be sleeping, but moving her lips as if she is talking to someone. She would not awake after several attempts to wake her by calling her name. Nurse techs at pt's door reported that pt had been awake earlier and continued to be combative with them. IP treatment continues to be sought for her.  Kenna Gilbert. Lovena Le, [place], [place], [facility] Counselor

## 2017-05-10 DIAGNOSIS — F0151 Vascular dementia with behavioral disturbance: Secondary | ICD-10-CM | POA: Diagnosis not present

## 2017-05-10 DIAGNOSIS — Z87891 Personal history of nicotine dependence: Secondary | ICD-10-CM | POA: Diagnosis not present

## 2017-05-10 DIAGNOSIS — R4587 Impulsiveness: Secondary | ICD-10-CM | POA: Diagnosis not present

## 2017-05-10 DIAGNOSIS — F4325 Adjustment disorder with mixed disturbance of emotions and conduct: Secondary | ICD-10-CM | POA: Diagnosis not present

## 2017-05-10 MED ORDER — STERILE WATER FOR INJECTION IJ SOLN
INTRAMUSCULAR | Status: AC
  Administered 2017-05-10: 10 mL
  Filled 2017-05-10: qty 10

## 2017-05-10 MED ORDER — ZIPRASIDONE MESYLATE 20 MG IM SOLR
INTRAMUSCULAR | Status: AC
  Administered 2017-05-10: 20 mg
  Filled 2017-05-10: qty 20
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
## 2017-05-10 NOTE — Consult Note (Signed)
[facility] Consult   Reason for Consult: Dementia  Referring Physician:  EDP Patient Identification: Cristina Middleton MRN:  [id number] Principal Diagnosis: Vascular dementia of acute onset with behavioral disturbance Diagnosis:   Patient Active Problem List   Diagnosis Date Noted  . Adjustment disorder with mixed disturbance of emotions and conduct [F43.25] 05/07/2017  . Vascular dementia of acute onset with behavioral disturbance [F01.51] 05/07/2017  . Anxiety [F41.9] 12/09/2016  . Hyperlipidemia [E78.5] 12/09/2016  . Acute pancreatitis [K85.90] 12/08/2016  . GIB (gastrointestinal bleeding) [K92.2] 06/13/2016  . Bright red blood per rectum [K62.5] 06/13/2016  . Influenza with respiratory manifestation [J11.1] 06/13/2016  . Acute respiratory failure with hypoxia ([place]) [J96.01] 06/13/2016  . Reactive airway disease [J45.909] 06/13/2016  . Varicose veins of left lower extremity with complications [S85.462] 70/35/0093  . SOB (shortness of breath) [R06.02] 06/28/2014  . Abdominal pain [R10.9] 06/28/2014  . Encounter for nasogastric (NG) tube placement [Z46.59]   . SBO (small bowel obstruction) ([place]) [K56.609] 06/27/2014  . Diabetes mellitus without complication ([place]) [G18.2] 06/27/2014  . Acute blood loss anemia [D62] 05/26/2012  . HTN (hypertension) [I10] 05/25/2012  . Diabetes mellitus, type 2 ([place]) [E11.9] 05/25/2012  . Osteoarthritis of right knee [M17.11] 05/25/2012  . Sleep apnea [G47.30] 05/25/2012  . GERD (gastroesophageal reflux disease) [K21.9] 05/25/2012  . Hypothyroidism [E03.9] 05/25/2012    Total Time spent with patient: 45 minutes  HPI:   Patient is a 84 y.o. female patient admitted with acute recent behavioral changes noted over the last couple of months. Patient presented to the emergency room on 1/5 for a progressively worsening change in her mental status. Please see those notes. Patient's son wants to be safe as he feels that his mother needs a  higher level of care than he can presently provided at home. Patient has been becoming increasingly agitated and aggressive at times. Her manner speech is also changed in that she is very loud and deliberate. She also seems to become angry at times when speaking.  She has had prior violence towards individuals intermittently at home. At other times, she will be very apologetic and speaks in a more subdued manner. Patient's symptoms have been progressively getting worse. Patient seems more aggressive today than yesterday while speaking to the provider at the sink trying to get ready for the day. Patient denies suicidal ideation, thoughts of hurting herself or others and feels safe at home.      Past Psychiatric History: Patient is being treated for Anxiety by Dr. Maudie Mercury.  Risk to Self: Suicidal Ideation: (UTA) Suicidal Intent: (UTA) Is patient at risk for suicide?: (UTA) Suicidal Plan?: (UTA) Access to Means: (UTA) What has been your use of drugs/alcohol within the last 12 months?: (UTA) How many times?: (UTA) Other Self Harm Risks: (UTA) Triggers for Past Attempts: (UTA) Intentional Self Injurious Behavior: (UTA) Risk to Others: Homicidal Ideation: (UTA) Thoughts of Harm to Others: (UTA) Current Homicidal Intent: (UTA) Current Homicidal Plan: (UTA) Access to Homicidal Means: (UTA) Identified Victim: (UTA) History of harm to others?: (UTA) Assessment of Violence: (UTA) Violent Behavior Description: (UTA) Does patient have access to weapons?: (UTA) Criminal Charges Pending?: No Does patient have a court date: No Prior Inpatient Therapy: Prior Inpatient Therapy: No Prior Therapy Dates: NA Prior Therapy Facilty/Provider(s): NA Reason for Treatment: NA Prior Outpatient Therapy: Prior Outpatient Therapy: (UTA) Prior Therapy Dates: (UTA) Prior Therapy Facilty/Provider(s): (UTA) Reason for Treatment: (UTA) Does patient have an ACCT team?: Unknown Does patient have Intensive In-House  Services?  : Unknown Does patient have [facility] services? : Unknown Does patient have P4CC services?: Unknown  Past Medical History:  Past Medical History:  Diagnosis Date  . Anxiety   . Arthritis   . Diabetes mellitus without complication ([place])   . Dislocated shoulder    WAS POPPED BACK IN PLACE  2006???  . GERD (gastroesophageal reflux disease)    TAKES  ACIPHEX  . Hyperlipidemia   . Hypertension   . Hyperthyroidism    NOT SURE WHICH ONE  . PONV (postoperative nausea and vomiting)    YRS AGO.....(6-7 YRS)  . Sleep apnea    WEARS MASK EVERY NOW AND THEN    Past Surgical History:  Procedure Laterality Date  . BREAST SURGERY     CYST REMOVED -- LEFT  . ENDOVENOUS ABLATION SAPHENOUS VEIN W/ LASER Left 07/11/2016   endovenous laser ablation left greater saphenous vein by Tinnie Gens MD  . [place]  . LIPOMA EXCISION     FROM LEFT BACK  . TONSILLECTOMY    . TOTAL KNEE ARTHROPLASTY  05/25/2012   right knee  . TOTAL KNEE ARTHROPLASTY  05/25/2012   Procedure: TOTAL KNEE ARTHROPLASTY;  Surgeon: Yvette Rack., MD;  Location: [facility];  Service: Orthopedics;  Laterality: Right;   Family History:  Family History  Problem Relation Age of Onset  . Other Mother   . Other Father   . Breast cancer Sister   . Colon cancer Sister   . CAD Sister   . Marfan syndrome Sister   . CAD Brother   . Marfan syndrome Brother      Social History:  Social History   Substance and Sexual Activity  Alcohol Use No     Social History   Substance and Sexual Activity  Drug Use No    Social History   Socioeconomic History  . Marital status: Widowed    Spouse name: None  . Number of children: None  . Years of education: None  . Highest education level: None  Social Needs  . Financial resource strain: None  . Food insecurity - worry: None  . Food insecurity - inability: None  . Transportation needs - medical: None  . Transportation needs - non-medical: None   Occupational History  . None  Tobacco Use  . Smoking status: Former Smoker    Packs/day: 1.00    Types: Cigarettes    Last attempt to quit: 05/02/1998    Years since quitting: 19.0  . Smokeless tobacco: Never Used  Substance and Sexual Activity  . Alcohol use: No  . Drug use: No  . Sexual activity: No  Other Topics Concern  . None  Social History Narrative  . None     Allergies:   Allergies  Allergen Reactions  . Other Other (See Comments)    Pt is allergic to Alka-Seltzer tablets.   Reaction:  Hallucinations   . Penicillins Hives, Itching, Swelling and Other (See Comments)    Reaction:  Unspecified swelling reaction Has patient had a PCN reaction causing immediate rash, facial/tongue/throat swelling, SOB or lightheadedness with hypotension: Yes Has patient had a PCN reaction causing severe rash involving mucus membranes or skin necrosis: No Has patient had a PCN reaction that required hospitalization No Has patient had a PCN reaction occurring within the last 10 years: No If all of the above answers are "NO", then may proceed with Cephalosporin use.  . Sulfa Antibiotics Hives, Itching, Swelling  and Other (See Comments)    Reaction:  Unspecified swelling reaction    Labs:  No results found for this or any previous visit (from the past 48 hour(s)).  Current Facility-Administered Medications  Medication Dose Route Frequency Provider Last Rate Last Dose  . acetaminophen (TYLENOL) tablet 325 mg  325 mg Oral Q6H PRN Dorie Rank, MD   325 mg at 05/07/17 0906  . ALPRAZolam Duanne Moron) tablet 0.25 mg  0.25 mg Oral QHS Dorie Rank, MD   0.25 mg at 05/08/17 2112  . aspirin EC tablet 81 mg  81 mg Oral QHS Dorie Rank, MD   81 mg at 05/08/17 2112  . calcium carbonate (OS-CAL - dosed in mg of elemental calcium) tablet 500 mg of elemental calcium  1 tablet Oral Q breakfast Tanna Furry, MD   500 mg of elemental calcium at 05/10/17 0842  . cyanocobalamin tablet 500 mcg  500 mcg Oral Daily  Dorie Rank, MD   500 mcg at 05/10/17 1041  . escitalopram (LEXAPRO) tablet 20 mg  20 mg Oral Daily Dorie Rank, MD   20 mg at 05/10/17 1041  . haloperidol (HALDOL) tablet 1 mg  1 mg Oral Once Ethelene Hal, NP      . indapamide (LOZOL) tablet 2.5 mg  2.5 mg Oral Once per day on Sun Wed Fri Knapp, Jon, MD   2.5 mg at 05/10/17 1041  . levothyroxine (SYNTHROID, LEVOTHROID) tablet 100 mcg  100 mcg Oral QAC breakfast Dorie Rank, MD   100 mcg at 05/10/17 [phone]  . metFORMIN (GLUCOPHAGE) tablet 250 mg  250 mg Oral QPC breakfast Dorie Rank, MD   250 mg at 05/10/17 0841  . pantoprazole (PROTONIX) EC tablet 40 mg  40 mg Oral Daily Dorie Rank, MD   40 mg at 05/10/17 1042  . polyethylene glycol (MIRALAX / GLYCOLAX) packet 17 g  17 g Oral Daily PRN Dorie Rank, MD      . simvastatin (ZOCOR) tablet 20 mg  20 mg Oral Pete Glatter, MD   20 mg at 05/08/17 2112   Current Outpatient Medications  Medication Sig Dispense Refill  . acetaminophen (TYLENOL) 325 MG tablet Take 650 mg by mouth at bedtime.     . ALPRAZolam (XANAX) 0.25 MG tablet Take 0.25 mg by mouth at bedtime.     Marland Kitchen aspirin EC 81 MG tablet Take 81 mg by mouth at bedtime.     . calcium carbonate (OSCAL) 1500 (600 Ca) MG TABS tablet Take 600 mg of elemental calcium by mouth daily.    Marland Kitchen CINNAMON PO Take 1 tablet by mouth daily.     Marland Kitchen docusate sodium (COLACE) 100 MG capsule Take 100 mg by mouth daily.    Marland Kitchen escitalopram (LEXAPRO) 20 MG tablet Take 20 mg by mouth daily.    . Ferrous Gluconate 324 (37.5 Fe) MG TABS Take 324 mg by mouth daily.    . indapamide (LOZOL) 2.5 MG tablet Take 2.5 mg by mouth 3 (three) times a week. Pt takes on Sunday, Wednesday, and Friday.    Marland Kitchen L-Methylfolate-B12-B6-B2 (CEREFOLIN PO) Take 1 tablet by mouth daily.    Marland Kitchen levothyroxine (SYNTHROID, LEVOTHROID) 100 MCG tablet Take 100 mcg by mouth daily before breakfast.    . loratadine (CLARITIN) 10 MG tablet Take 10 mg by mouth daily as needed for allergies.     . metFORMIN  (GLUCOPHAGE) 500 MG tablet Take 250 mg by mouth daily after breakfast.     . Multiple Vitamins-Minerals (  PRESERVISION AREDS 2) CAPS Take 1 capsule by mouth daily.    . polyethylene glycol (MIRALAX / GLYCOLAX) packet Take 17 g by mouth daily as needed for moderate constipation.    . QUEtiapine (SEROQUEL) 50 MG tablet Take 50 mg by mouth 2 (two) times daily.     . RABEprazole (ACIPHEX) 20 MG tablet Take 20 mg by mouth 2 (two) times daily.     . simvastatin (ZOCOR) 20 MG tablet Take 20 mg by mouth at bedtime.     . vitamin B-12 (CYANOCOBALAMIN) 500 MCG tablet Take 500 mcg by mouth daily.      Musculoskeletal: Strength & Muscle Tone: within normal limits Gait & Station: normal Patient leans: N/A  Psychiatric Specialty Exam: Physical Exam  Nursing note and vitals reviewed. Constitutional: She appears well-developed and well-nourished.  HENT:  Head: Normocephalic and atraumatic.  Respiratory: Effort normal.  Neurological: She is alert. She has normal strength.  Psychiatric: Her speech is normal. Her affect is labile. She is slowed. Thought content is delusional. Cognition and memory are impaired. She expresses impulsivity. She exhibits abnormal recent memory and abnormal remote memory.    Review of Systems  Psychiatric/Behavioral: Positive for memory loss. Negative for depression, hallucinations, substance abuse and suicidal ideas. The patient is not nervous/anxious and does not have insomnia.   All other systems reviewed and are negative.   Blood pressure 140/77, pulse 96, temperature 97.6 F (36.4 C), temperature source Oral, resp. rate 18, height 5\' 3"  (1.6 m), weight 61.2 kg (135 lb), SpO2 95 %.Body mass index is 23.91 kg/m.  General Appearance: Casual  Eye Contact:  Good  Speech:  Pressured  Volume:  Increased  Mood:  Frustrated  Affect:  Congruent  Thought Process:  Disorganized  Orientation:  Other:  Not oriented to time person or place  Thought Content:  Illogical  Suicidal  Thoughts:  Patient unable to communicate suicidal ideation  Homicidal Thoughts:  Patient unable to communicate homicidal thoughts  Memory:  Negative  Judgement:  Impaired  Insight:  Lacking  Psychomotor Activity:  Normal  Concentration:  Unable  Recall:  Poor  Fund of Knowledge:  Negative  Language:  Good  Akathisia:  No    AIMS (if indicated):   N/A  Assets:  Housing Social Support  ADL's:  Impaired  Cognition:  Impaired,  Moderate  Sleep:   N/A     Treatment Plan Summary: Daily contact with patient to assess and evaluate symptoms and progress in treatment and Medication management (see MAR)  Disposition: Recommend psychiatric Inpatient admission when medically cleared. TTS to seek placement  Ethelene Hal, NP 05/10/2017 11:58 AM    Patient seen face-to-face for psychiatric evaluation, chart reviewed and case discussed with the physician extender and developed treatment plan. Reviewed the information documented and agree with the treatment plan.  Buford Dresser, DO

## 2017-05-10 NOTE — ED Notes (Signed)
Dr Stark Jock into see pt within 10 minutes of fall , Son Kamari Bilek contacted related to fall states he will contact [facility]

## 2017-05-10 NOTE — [facility] Counselor (Signed)
Per Lenna Sciara, pt has been accepted to [facility] pending discharges. Melissa reported, she will call when the pt can arrive and assigned a bed will be discussed after discharges. Updated with Mortimer Fries, RN.   Vertell Novak, MS, [facility], [facility] Triage Specialist [phone]

## 2017-05-10 NOTE — BH Assessment (Signed)
[facility] Assessment Progress Note  At 09:38 this Probation officer called Cristina Middleton at [facility].  She confirms that pt has been pre-accepted to their facility by Dr Alonna Minium in anticipation of discharges scheduled for later today.  She will call when they are ready to receive the pt.  Report is to be called to [phone].  Pt is under IVC, and is to be transported via [facility] when the time comes.  This will be staffed with Buford Dresser, DO.  Jalene Mullet, [place] Coordinator [phone]

## 2017-05-10 NOTE — ED Notes (Signed)
Cristina Middleton became verbally abusive " Hey Mr Jules Husbands what the h--L you looking at" and physically aggressive toward  By striking at staff when redirected to allow to assist her with ambulation. Cristina Maj is frequently attempting to get OOB and is displaying an unsteady gait and this Probation officer fears that a fall is in her immediate future. Sitter at bedside to ensure safety and prevent injury but Cristina Suto is resistant to staff assistance. Marland Kitchen

## 2017-05-11 DIAGNOSIS — Z9183 Wandering in diseases classified elsewhere: Secondary | ICD-10-CM | POA: Diagnosis not present

## 2017-05-11 DIAGNOSIS — R451 Restlessness and agitation: Secondary | ICD-10-CM | POA: Diagnosis not present

## 2017-05-11 DIAGNOSIS — F0151 Vascular dementia with behavioral disturbance: Secondary | ICD-10-CM | POA: Diagnosis not present

## 2017-05-11 DIAGNOSIS — I6782 Cerebral ischemia: Secondary | ICD-10-CM | POA: Diagnosis not present

## 2017-05-11 DIAGNOSIS — G9389 Other specified disorders of brain: Secondary | ICD-10-CM | POA: Diagnosis not present

## 2017-05-11 DIAGNOSIS — W19XXXD Unspecified fall, subsequent encounter: Secondary | ICD-10-CM | POA: Diagnosis not present

## 2017-05-11 DIAGNOSIS — E539 Vitamin B deficiency, unspecified: Secondary | ICD-10-CM | POA: Diagnosis not present

## 2017-05-11 DIAGNOSIS — I9589 Other hypotension: Secondary | ICD-10-CM | POA: Diagnosis not present

## 2017-05-11 DIAGNOSIS — Z5181 Encounter for therapeutic drug level monitoring: Secondary | ICD-10-CM | POA: Diagnosis not present

## 2017-05-11 DIAGNOSIS — R748 Abnormal levels of other serum enzymes: Secondary | ICD-10-CM | POA: Diagnosis present

## 2017-05-11 DIAGNOSIS — M25551 Pain in right hip: Secondary | ICD-10-CM | POA: Diagnosis not present

## 2017-05-11 DIAGNOSIS — Z7982 Long term (current) use of aspirin: Secondary | ICD-10-CM | POA: Diagnosis not present

## 2017-05-11 DIAGNOSIS — F4325 Adjustment disorder with mixed disturbance of emotions and conduct: Secondary | ICD-10-CM | POA: Diagnosis not present

## 2017-05-11 DIAGNOSIS — I129 Hypertensive chronic kidney disease with stage 1 through stage 4 chronic kidney disease, or unspecified chronic kidney disease: Secondary | ICD-10-CM | POA: Diagnosis present

## 2017-05-11 DIAGNOSIS — J45909 Unspecified asthma, uncomplicated: Secondary | ICD-10-CM | POA: Diagnosis present

## 2017-05-11 DIAGNOSIS — G319 Degenerative disease of nervous system, unspecified: Secondary | ICD-10-CM | POA: Diagnosis not present

## 2017-05-11 DIAGNOSIS — H1033 Unspecified acute conjunctivitis, bilateral: Secondary | ICD-10-CM | POA: Diagnosis not present

## 2017-05-11 DIAGNOSIS — Z87891 Personal history of nicotine dependence: Secondary | ICD-10-CM | POA: Diagnosis not present

## 2017-05-11 DIAGNOSIS — I4581 Long QT syndrome: Secondary | ICD-10-CM | POA: Diagnosis present

## 2017-05-11 DIAGNOSIS — G4730 Sleep apnea, unspecified: Secondary | ICD-10-CM | POA: Diagnosis present

## 2017-05-11 DIAGNOSIS — R4189 Other symptoms and signs involving cognitive functions and awareness: Secondary | ICD-10-CM | POA: Diagnosis not present

## 2017-05-11 DIAGNOSIS — N179 Acute kidney failure, unspecified: Secondary | ICD-10-CM | POA: Diagnosis not present

## 2017-05-11 DIAGNOSIS — I10 Essential (primary) hypertension: Secondary | ICD-10-CM | POA: Diagnosis not present

## 2017-05-11 DIAGNOSIS — R9431 Abnormal electrocardiogram [ECG] [EKG]: Secondary | ICD-10-CM | POA: Diagnosis not present

## 2017-05-11 DIAGNOSIS — F419 Anxiety disorder, unspecified: Secondary | ICD-10-CM | POA: Diagnosis present

## 2017-05-11 DIAGNOSIS — E119 Type 2 diabetes mellitus without complications: Secondary | ICD-10-CM | POA: Diagnosis not present

## 2017-05-11 DIAGNOSIS — Y92239 Unspecified place in hospital as the place of occurrence of the external cause: Secondary | ICD-10-CM | POA: Diagnosis not present

## 2017-05-11 DIAGNOSIS — W19XXXA Unspecified fall, initial encounter: Secondary | ICD-10-CM | POA: Diagnosis not present

## 2017-05-11 DIAGNOSIS — S0191XA Laceration without foreign body of unspecified part of head, initial encounter: Secondary | ICD-10-CM | POA: Diagnosis not present

## 2017-05-11 DIAGNOSIS — S0101XD Laceration without foreign body of scalp, subsequent encounter: Secondary | ICD-10-CM | POA: Diagnosis not present

## 2017-05-11 DIAGNOSIS — I959 Hypotension, unspecified: Secondary | ICD-10-CM | POA: Diagnosis not present

## 2017-05-11 DIAGNOSIS — M1990 Unspecified osteoarthritis, unspecified site: Secondary | ICD-10-CM | POA: Diagnosis present

## 2017-05-11 DIAGNOSIS — F0391 Unspecified dementia with behavioral disturbance: Secondary | ICD-10-CM | POA: Diagnosis present

## 2017-05-11 DIAGNOSIS — S0101XA Laceration without foreign body of scalp, initial encounter: Secondary | ICD-10-CM | POA: Diagnosis not present

## 2017-05-11 DIAGNOSIS — E039 Hypothyroidism, unspecified: Secondary | ICD-10-CM | POA: Diagnosis present

## 2017-05-11 DIAGNOSIS — G9340 Encephalopathy, unspecified: Secondary | ICD-10-CM | POA: Diagnosis not present

## 2017-05-11 DIAGNOSIS — F29 Unspecified psychosis not due to a substance or known physiological condition: Secondary | ICD-10-CM | POA: Diagnosis not present

## 2017-05-11 DIAGNOSIS — S0990XA Unspecified injury of head, initial encounter: Secondary | ICD-10-CM | POA: Diagnosis not present

## 2017-05-11 DIAGNOSIS — E785 Hyperlipidemia, unspecified: Secondary | ICD-10-CM | POA: Diagnosis present

## 2017-05-11 DIAGNOSIS — Z794 Long term (current) use of insulin: Secondary | ICD-10-CM | POA: Diagnosis not present

## 2017-05-11 DIAGNOSIS — E1122 Type 2 diabetes mellitus with diabetic chronic kidney disease: Secondary | ICD-10-CM | POA: Diagnosis present

## 2017-05-11 DIAGNOSIS — K219 Gastro-esophageal reflux disease without esophagitis: Secondary | ICD-10-CM | POA: Diagnosis present

## 2017-05-11 DIAGNOSIS — N182 Chronic kidney disease, stage 2 (mild): Secondary | ICD-10-CM | POA: Diagnosis present

## 2017-05-11 DIAGNOSIS — K5900 Constipation, unspecified: Secondary | ICD-10-CM | POA: Diagnosis not present

## 2017-05-11 DIAGNOSIS — E1169 Type 2 diabetes mellitus with other specified complication: Secondary | ICD-10-CM | POA: Diagnosis present

## 2017-05-11 DIAGNOSIS — M47896 Other spondylosis, lumbar region: Secondary | ICD-10-CM | POA: Diagnosis not present

## 2017-05-11 DIAGNOSIS — R4587 Impulsiveness: Secondary | ICD-10-CM | POA: Diagnosis not present

## 2017-05-11 DIAGNOSIS — E748 Other specified disorders of carbohydrate metabolism: Secondary | ICD-10-CM | POA: Diagnosis not present

## 2017-05-11 DIAGNOSIS — E876 Hypokalemia: Secondary | ICD-10-CM | POA: Diagnosis present

## 2017-05-11 NOTE — ED Notes (Signed)
Called placed to Shirlee Limerick, Intake RN at [facility] unit.  Shirlee Limerick has taken report on the patient who will be going to Rm 410 A.   [facility] [place] department for transport to [facility].  Dispatch will call back w/an ETA after speaking to her supervisor to arrange transport.

## 2017-05-11 NOTE — ED Notes (Signed)
[name] has been called and informed that pt has been picked up by [facility] to go to [facility].

## 2017-05-11 NOTE — ED Notes (Signed)
Pt asleep.  Previous RN states she was up all night and has probably worn herself out.  Will continue to monitor but will let her sleep as she looks very comfortable at this time.

## 2017-05-11 NOTE — Consult Note (Signed)
[facility] Consult   Reason for Consult: Dementia with behavior changes Referring Physician:  EDP Patient Identification: Cristina Middleton MRN:  [id number] Principal Diagnosis: Vascular dementia of acute onset with behavioral disturbance Diagnosis:   Patient Active Problem List   Diagnosis Date Noted  . Adjustment disorder with mixed disturbance of emotions and conduct [F43.25] 05/07/2017  . Vascular dementia of acute onset with behavioral disturbance [F01.51] 05/07/2017  . Anxiety [F41.9] 12/09/2016  . Hyperlipidemia [E78.5] 12/09/2016  . Acute pancreatitis [K85.90] 12/08/2016  . GIB (gastrointestinal bleeding) [K92.2] 06/13/2016  . Bright red blood per rectum [K62.5] 06/13/2016  . Influenza with respiratory manifestation [J11.1] 06/13/2016  . Acute respiratory failure with hypoxia ([place]) [J96.01] 06/13/2016  . Reactive airway disease [J45.909] 06/13/2016  . Varicose veins of left lower extremity with complications [W29.562] 13/11/6576  . SOB (shortness of breath) [R06.02] 06/28/2014  . Abdominal pain [R10.9] 06/28/2014  . Encounter for nasogastric (NG) tube placement [Z46.59]   . SBO (small bowel obstruction) ([place]) [K56.609] 06/27/2014  . Diabetes mellitus without complication ([place]) [I69.6] 06/27/2014  . Acute blood loss anemia [D62] 05/26/2012  . HTN (hypertension) [I10] 05/25/2012  . Diabetes mellitus, type 2 ([place]) [E11.9] 05/25/2012  . Osteoarthritis of right knee [M17.11] 05/25/2012  . Sleep apnea [G47.30] 05/25/2012  . GERD (gastroesophageal reflux disease) [K21.9] 05/25/2012  . Hypothyroidism [E03.9] 05/25/2012    Total Time spent with patient: 30 minutes  HPI:   Patient is a 84 y.o. female patient admitted with acute recent behavioral changes noted over the last couple of months. Patient presented to the emergency room on 1/5 for a progressively worsening change in her mental status. Please see those notes. Patient's son wants to be safe as he feels that  his mother needs a higher level of care than he can presently provided at home. Patient has been becoming increasingly agitated and aggressive at times. Her manner speech is also changed in that she is very loud and deliberate. She also seems to become angry at times when speaking.  She has had prior violence towards individuals intermittently at home. At other times, she will be very apologetic and speaks in a more subdued manner. Patient's symptoms have been progressively getting worse. Patient is more pleasant and appropriate to talk to this morning. Patient denies suicidal ideation, thoughts of hurting herself or others and feels safe at home.      Past Psychiatric History: Patient is being treated for Anxiety by Dr. Maudie Mercury.  Risk to Self: Suicidal Ideation: (UTA) Suicidal Intent: (UTA) Is patient at risk for suicide?: (UTA) Suicidal Plan?: (UTA) Access to Means: (UTA) What has been your use of drugs/alcohol within the last 12 months?: (UTA) How many times?: (UTA) Other Self Harm Risks: (UTA) Triggers for Past Attempts: (UTA) Intentional Self Injurious Behavior: (UTA) Risk to Others: Homicidal Ideation: (UTA) Thoughts of Harm to Others: (UTA) Current Homicidal Intent: (UTA) Current Homicidal Plan: (UTA) Access to Homicidal Means: (UTA) Identified Victim: (UTA) History of harm to others?: (UTA) Assessment of Violence: (UTA) Violent Behavior Description: (UTA) Does patient have access to weapons?: (UTA) Criminal Charges Pending?: No Does patient have a court date: No Prior Inpatient Therapy: Prior Inpatient Therapy: No Prior Therapy Dates: NA Prior Therapy Facilty/Provider(s): NA Reason for Treatment: NA Prior Outpatient Therapy: Prior Outpatient Therapy: (UTA) Prior Therapy Dates: (UTA) Prior Therapy Facilty/Provider(s): (UTA) Reason for Treatment: (UTA) Does patient have an ACCT team?: Unknown Does patient have Intensive In-House Services?  : Unknown Does patient have [facility]  services? :  Unknown Does patient have P4[facility] services?: Unknown  Past Medical History:  Past Medical History:  Diagnosis Date  . Anxiety   . Arthritis   . Diabetes mellitus without complication ([place])   . Dislocated shoulder    WAS POPPED BACK IN PLACE  2006???  . GERD (gastroesophageal reflux disease)    TAKES  ACIPHEX  . Hyperlipidemia   . Hypertension   . Hyperthyroidism    NOT SURE WHICH ONE  . PONV (postoperative nausea and vomiting)    YRS AGO.....(6-7 YRS)  . Sleep apnea    WEARS MASK EVERY NOW AND THEN    Past Surgical History:  Procedure Laterality Date  . BREAST SURGERY     CYST REMOVED -- LEFT  . ENDOVENOUS ABLATION SAPHENOUS VEIN W/ LASER Left 07/11/2016   endovenous laser ablation left greater saphenous vein by Tinnie Gens MD  . [place]  . LIPOMA EXCISION     FROM LEFT BACK  . TONSILLECTOMY    . TOTAL KNEE ARTHROPLASTY  05/25/2012   right knee  . TOTAL KNEE ARTHROPLASTY  05/25/2012   Procedure: TOTAL KNEE ARTHROPLASTY;  Surgeon: Yvette Rack., MD;  Location: [facility];  Service: Orthopedics;  Laterality: Right;   Family History:  Family History  Problem Relation Age of Onset  . Other Mother   . Other Father   . Breast cancer Sister   . Colon cancer Sister   . CAD Sister   . Marfan syndrome Sister   . CAD Brother   . Marfan syndrome Brother      Social History:  Social History   Substance and Sexual Activity  Alcohol Use No     Social History   Substance and Sexual Activity  Drug Use No    Social History   Socioeconomic History  . Marital status: Widowed    Spouse name: None  . Number of children: None  . Years of education: None  . Highest education level: None  Social Needs  . Financial resource strain: None  . Food insecurity - worry: None  . Food insecurity - inability: None  . Transportation needs - medical: None  . Transportation needs - non-medical: None  Occupational History  . None  Tobacco Use  .  Smoking status: Former Smoker    Packs/day: 1.00    Types: Cigarettes    Last attempt to quit: 05/02/1998    Years since quitting: 19.0  . Smokeless tobacco: Never Used  Substance and Sexual Activity  . Alcohol use: No  . Drug use: No  . Sexual activity: No  Other Topics Concern  . None  Social History Narrative  . None     Allergies:   Allergies  Allergen Reactions  . Other Other (See Comments)    Pt is allergic to Alka-Seltzer tablets.   Reaction:  Hallucinations   . Penicillins Hives, Itching, Swelling and Other (See Comments)    Reaction:  Unspecified swelling reaction Has patient had a PCN reaction causing immediate rash, facial/tongue/throat swelling, SOB or lightheadedness with hypotension: Yes Has patient had a PCN reaction causing severe rash involving mucus membranes or skin necrosis: No Has patient had a PCN reaction that required hospitalization No Has patient had a PCN reaction occurring within the last 10 years: No If all of the above answers are "NO", then may proceed with Cephalosporin use.  . Sulfa Antibiotics Hives, Itching, Swelling and Other (See Comments)    Reaction:  Unspecified  swelling reaction    Labs:  No results found for this or any previous visit (from the past 48 hour(s)).  Current Facility-Administered Medications  Medication Dose Route Frequency Provider Last Rate Last Dose  . acetaminophen (TYLENOL) tablet 325 mg  325 mg Oral Q6H PRN Dorie Rank, MD   325 mg at 05/07/17 0906  . aspirin EC tablet 81 mg  81 mg Oral QHS Dorie Rank, MD   81 mg at 05/08/17 2112  . calcium carbonate (OS-CAL - dosed in mg of elemental calcium) tablet 500 mg of elemental calcium  1 tablet Oral Q breakfast Tanna Furry, MD   500 mg of elemental calcium at 05/11/17 0847  . cyanocobalamin tablet 500 mcg  500 mcg Oral Daily Dorie Rank, MD   500 mcg at 05/10/17 1041  . escitalopram (LEXAPRO) tablet 20 mg  20 mg Oral Daily Dorie Rank, MD   20 mg at 05/10/17 1041  .  haloperidol (HALDOL) tablet 1 mg  1 mg Oral Once Ethelene Hal, NP      . indapamide (LOZOL) tablet 2.5 mg  2.5 mg Oral Once per day on Sun Wed Fri Knapp, Jon, MD   2.5 mg at 05/10/17 1041  . levothyroxine (SYNTHROID, LEVOTHROID) tablet 100 mcg  100 mcg Oral QAC breakfast Dorie Rank, MD   100 mcg at 05/11/17 0848  . metFORMIN (GLUCOPHAGE) tablet 250 mg  250 mg Oral QPC breakfast Dorie Rank, MD   250 mg at 05/11/17 0848  . pantoprazole (PROTONIX) EC tablet 40 mg  40 mg Oral Daily Dorie Rank, MD   40 mg at 05/10/17 1042  . polyethylene glycol (MIRALAX / GLYCOLAX) packet 17 g  17 g Oral Daily PRN Dorie Rank, MD      . simvastatin (ZOCOR) tablet 20 mg  20 mg Oral Pete Glatter, MD   20 mg at 05/08/17 2112   Current Outpatient Medications  Medication Sig Dispense Refill  . acetaminophen (TYLENOL) 325 MG tablet Take 650 mg by mouth at bedtime.     . ALPRAZolam (XANAX) 0.25 MG tablet Take 0.25 mg by mouth at bedtime.     Marland Kitchen aspirin EC 81 MG tablet Take 81 mg by mouth at bedtime.     . calcium carbonate (OSCAL) 1500 (600 Ca) MG TABS tablet Take 600 mg of elemental calcium by mouth daily.    Marland Kitchen CINNAMON PO Take 1 tablet by mouth daily.     Marland Kitchen docusate sodium (COLACE) 100 MG capsule Take 100 mg by mouth daily.    Marland Kitchen escitalopram (LEXAPRO) 20 MG tablet Take 20 mg by mouth daily.    . Ferrous Gluconate 324 (37.5 Fe) MG TABS Take 324 mg by mouth daily.    . indapamide (LOZOL) 2.5 MG tablet Take 2.5 mg by mouth 3 (three) times a week. Pt takes on Sunday, Wednesday, and Friday.    Marland Kitchen L-Methylfolate-B12-B6-B2 (CEREFOLIN PO) Take 1 tablet by mouth daily.    Marland Kitchen levothyroxine (SYNTHROID, LEVOTHROID) 100 MCG tablet Take 100 mcg by mouth daily before breakfast.    . loratadine (CLARITIN) 10 MG tablet Take 10 mg by mouth daily as needed for allergies.     . metFORMIN (GLUCOPHAGE) 500 MG tablet Take 250 mg by mouth daily after breakfast.     . Multiple Vitamins-Minerals (PRESERVISION AREDS 2) CAPS Take 1 capsule  by mouth daily.    . polyethylene glycol (MIRALAX / GLYCOLAX) packet Take 17 g by mouth daily as needed for moderate constipation.    Marland Kitchen  QUEtiapine (SEROQUEL) 50 MG tablet Take 50 mg by mouth 2 (two) times daily.     . RABEprazole (ACIPHEX) 20 MG tablet Take 20 mg by mouth 2 (two) times daily.     . simvastatin (ZOCOR) 20 MG tablet Take 20 mg by mouth at bedtime.     . vitamin B-12 (CYANOCOBALAMIN) 500 MCG tablet Take 500 mcg by mouth daily.      Musculoskeletal: Strength & Muscle Tone: within normal limits Gait & Station: normal Patient leans: N/A  Psychiatric Specialty Exam: Physical Exam  Nursing note and vitals reviewed. Constitutional: She appears well-developed and well-nourished.  HENT:  Head: Normocephalic and atraumatic.  Respiratory: Effort normal.  Neurological: She is alert. She has normal strength.  Psychiatric: Her speech is normal. Her affect is labile. She is slowed. Thought content is delusional. Cognition and memory are impaired. She expresses impulsivity. She exhibits abnormal recent memory and abnormal remote memory.    Review of Systems  Psychiatric/Behavioral: Positive for memory loss. Negative for depression, hallucinations, substance abuse and suicidal ideas. The patient is not nervous/anxious and does not have insomnia.   All other systems reviewed and are negative.   Blood pressure 93/61, pulse 82, temperature 98.1 F (36.7 C), temperature source Oral, resp. rate 18, height 5\' 3"  (1.6 m), weight 61.2 kg (135 lb), SpO2 97 %.Body mass index is 23.91 kg/m.  General Appearance: Casual  Eye Contact:  Good  Speech:  Pressured  Volume:  Increased  Mood:  Frustrated  Affect:  Congruent  Thought Process:  Disorganized  Orientation:  Other:  Not oriented to time person or place  Thought Content:  Illogical  Suicidal Thoughts:  Patient unable to communicate suicidal ideation  Homicidal Thoughts:  Patient unable to communicate homicidal thoughts  Memory:   Negative  Judgement:  Impaired  Insight:  Lacking  Psychomotor Activity:  Normal  Concentration:  Unable  Recall:  Poor  Fund of Knowledge:  Negative  Language:  Good  Akathisia:  No    AIMS (if indicated):   N/A  Assets:  Housing Social Support  ADL's:  Impaired  Cognition:  Impaired,  Moderate  Sleep:   N/A     Treatment Plan Summary: Daily contact with patient to assess and evaluate symptoms and progress in treatment and Medication management Dementia with behavioral changes: -Crisis stabilization -Medication management:  Medical medications restarted along with Lexapro 20 mg daily for depression -Individual counseling  Disposition: Recommend psychiatric Inpatient admission when medically cleared. TTS to seek placement  Waylan Boga, NP 05/11/2017 10:20 AM

## 2017-06-13 ENCOUNTER — Emergency Department (HOSPITAL_COMMUNITY): Payer: Medicare Other

## 2017-06-13 ENCOUNTER — Other Ambulatory Visit: Payer: Self-pay

## 2017-06-13 ENCOUNTER — Inpatient Hospital Stay (HOSPITAL_COMMUNITY)
Admission: EM | Admit: 2017-06-13 | Discharge: 2017-06-22 | DRG: 193 | Disposition: A | Discharge status: Skilled Nursing Facility | Payer: Medicare Other | Attending: Internal Medicine | Admitting: Internal Medicine

## 2017-06-13 ENCOUNTER — Encounter (HOSPITAL_COMMUNITY): Payer: Self-pay | Admitting: Emergency Medicine

## 2017-06-13 DIAGNOSIS — S0990XA Unspecified injury of head, initial encounter: Secondary | ICD-10-CM | POA: Diagnosis not present

## 2017-06-13 DIAGNOSIS — D649 Anemia, unspecified: Secondary | ICD-10-CM | POA: Diagnosis not present

## 2017-06-13 DIAGNOSIS — I509 Heart failure, unspecified: Secondary | ICD-10-CM

## 2017-06-13 DIAGNOSIS — G4700 Insomnia, unspecified: Secondary | ICD-10-CM | POA: Diagnosis present

## 2017-06-13 DIAGNOSIS — I2510 Atherosclerotic heart disease of native coronary artery without angina pectoris: Secondary | ICD-10-CM | POA: Diagnosis present

## 2017-06-13 DIAGNOSIS — E039 Hypothyroidism, unspecified: Secondary | ICD-10-CM | POA: Diagnosis present

## 2017-06-13 DIAGNOSIS — J9601 Acute respiratory failure with hypoxia: Secondary | ICD-10-CM | POA: Diagnosis not present

## 2017-06-13 DIAGNOSIS — K219 Gastro-esophageal reflux disease without esophagitis: Secondary | ICD-10-CM | POA: Diagnosis present

## 2017-06-13 DIAGNOSIS — I110 Hypertensive heart disease with heart failure: Secondary | ICD-10-CM | POA: Diagnosis present

## 2017-06-13 DIAGNOSIS — Z881 Allergy status to other antibiotic agents status: Secondary | ICD-10-CM

## 2017-06-13 DIAGNOSIS — Z96651 Presence of right artificial knee joint: Secondary | ICD-10-CM | POA: Diagnosis present

## 2017-06-13 DIAGNOSIS — R52 Pain, unspecified: Secondary | ICD-10-CM

## 2017-06-13 DIAGNOSIS — Z87891 Personal history of nicotine dependence: Secondary | ICD-10-CM

## 2017-06-13 DIAGNOSIS — G9341 Metabolic encephalopathy: Secondary | ICD-10-CM | POA: Diagnosis present

## 2017-06-13 DIAGNOSIS — R269 Unspecified abnormalities of gait and mobility: Secondary | ICD-10-CM | POA: Diagnosis not present

## 2017-06-13 DIAGNOSIS — Z7189 Other specified counseling: Secondary | ICD-10-CM

## 2017-06-13 DIAGNOSIS — Z22322 Carrier or suspected carrier of Methicillin resistant Staphylococcus aureus: Secondary | ICD-10-CM

## 2017-06-13 DIAGNOSIS — Z66 Do not resuscitate: Secondary | ICD-10-CM | POA: Diagnosis not present

## 2017-06-13 DIAGNOSIS — F329 Major depressive disorder, single episode, unspecified: Secondary | ICD-10-CM | POA: Diagnosis present

## 2017-06-13 DIAGNOSIS — E11649 Type 2 diabetes mellitus with hypoglycemia without coma: Secondary | ICD-10-CM | POA: Diagnosis not present

## 2017-06-13 DIAGNOSIS — S8992XA Unspecified injury of left lower leg, initial encounter: Secondary | ICD-10-CM | POA: Diagnosis not present

## 2017-06-13 DIAGNOSIS — R6510 Systemic inflammatory response syndrome (SIRS) of non-infectious origin without acute organ dysfunction: Secondary | ICD-10-CM | POA: Diagnosis present

## 2017-06-13 DIAGNOSIS — E119 Type 2 diabetes mellitus without complications: Secondary | ICD-10-CM

## 2017-06-13 DIAGNOSIS — E8809 Other disorders of plasma-protein metabolism, not elsewhere classified: Secondary | ICD-10-CM | POA: Diagnosis present

## 2017-06-13 DIAGNOSIS — I5033 Acute on chronic diastolic (congestive) heart failure: Secondary | ICD-10-CM | POA: Diagnosis not present

## 2017-06-13 DIAGNOSIS — G4733 Obstructive sleep apnea (adult) (pediatric): Secondary | ICD-10-CM | POA: Diagnosis not present

## 2017-06-13 DIAGNOSIS — F0151 Vascular dementia with behavioral disturbance: Secondary | ICD-10-CM | POA: Diagnosis not present

## 2017-06-13 DIAGNOSIS — S199XXA Unspecified injury of neck, initial encounter: Secondary | ICD-10-CM | POA: Diagnosis not present

## 2017-06-13 DIAGNOSIS — Z888 Allergy status to other drugs, medicaments and biological substances status: Secondary | ICD-10-CM

## 2017-06-13 DIAGNOSIS — R296 Repeated falls: Secondary | ICD-10-CM

## 2017-06-13 DIAGNOSIS — R509 Fever, unspecified: Secondary | ICD-10-CM | POA: Diagnosis not present

## 2017-06-13 DIAGNOSIS — R531 Weakness: Secondary | ICD-10-CM | POA: Diagnosis not present

## 2017-06-13 DIAGNOSIS — J09X2 Influenza due to identified novel influenza A virus with other respiratory manifestations: Secondary | ICD-10-CM | POA: Diagnosis not present

## 2017-06-13 DIAGNOSIS — E785 Hyperlipidemia, unspecified: Secondary | ICD-10-CM | POA: Diagnosis present

## 2017-06-13 DIAGNOSIS — Z8249 Family history of ischemic heart disease and other diseases of the circulatory system: Secondary | ICD-10-CM

## 2017-06-13 DIAGNOSIS — Z7982 Long term (current) use of aspirin: Secondary | ICD-10-CM

## 2017-06-13 DIAGNOSIS — G4730 Sleep apnea, unspecified: Secondary | ICD-10-CM | POA: Diagnosis present

## 2017-06-13 DIAGNOSIS — S8991XA Unspecified injury of right lower leg, initial encounter: Secondary | ICD-10-CM | POA: Diagnosis not present

## 2017-06-13 DIAGNOSIS — E876 Hypokalemia: Secondary | ICD-10-CM | POA: Diagnosis present

## 2017-06-13 DIAGNOSIS — J811 Chronic pulmonary edema: Secondary | ICD-10-CM

## 2017-06-13 DIAGNOSIS — D696 Thrombocytopenia, unspecified: Secondary | ICD-10-CM | POA: Diagnosis present

## 2017-06-13 DIAGNOSIS — I10 Essential (primary) hypertension: Secondary | ICD-10-CM | POA: Diagnosis present

## 2017-06-13 DIAGNOSIS — Z880 Allergy status to penicillin: Secondary | ICD-10-CM

## 2017-06-13 DIAGNOSIS — D539 Nutritional anemia, unspecified: Secondary | ICD-10-CM | POA: Diagnosis present

## 2017-06-13 DIAGNOSIS — R079 Chest pain, unspecified: Secondary | ICD-10-CM | POA: Diagnosis not present

## 2017-06-13 DIAGNOSIS — S79911A Unspecified injury of right hip, initial encounter: Secondary | ICD-10-CM | POA: Diagnosis not present

## 2017-06-13 DIAGNOSIS — R0902 Hypoxemia: Secondary | ICD-10-CM | POA: Diagnosis not present

## 2017-06-13 DIAGNOSIS — W19XXXA Unspecified fall, initial encounter: Secondary | ICD-10-CM

## 2017-06-13 DIAGNOSIS — Z79890 Hormone replacement therapy (postmenopausal): Secondary | ICD-10-CM

## 2017-06-13 DIAGNOSIS — R4182 Altered mental status, unspecified: Secondary | ICD-10-CM

## 2017-06-13 DIAGNOSIS — R443 Hallucinations, unspecified: Secondary | ICD-10-CM | POA: Diagnosis not present

## 2017-06-13 DIAGNOSIS — R627 Adult failure to thrive: Secondary | ICD-10-CM | POA: Diagnosis present

## 2017-06-13 DIAGNOSIS — Z515 Encounter for palliative care: Secondary | ICD-10-CM

## 2017-06-13 DIAGNOSIS — F419 Anxiety disorder, unspecified: Secondary | ICD-10-CM | POA: Diagnosis present

## 2017-06-13 DIAGNOSIS — Z79899 Other long term (current) drug therapy: Secondary | ICD-10-CM

## 2017-06-13 DIAGNOSIS — S79912A Unspecified injury of left hip, initial encounter: Secondary | ICD-10-CM | POA: Diagnosis not present

## 2017-06-13 DIAGNOSIS — F0280 Dementia in other diseases classified elsewhere without behavioral disturbance: Secondary | ICD-10-CM | POA: Diagnosis not present

## 2017-06-13 DIAGNOSIS — J9811 Atelectasis: Secondary | ICD-10-CM | POA: Diagnosis not present

## 2017-06-13 DIAGNOSIS — F4325 Adjustment disorder with mixed disturbance of emotions and conduct: Secondary | ICD-10-CM | POA: Diagnosis present

## 2017-06-13 LAB — CBC WITH DIFFERENTIAL/PLATELET
BASOS PCT: 0 %
Basophils Absolute: 0 10*3/uL (ref 0.0–0.1)
Eosinophils Absolute: 0.2 10*3/uL (ref 0.0–0.7)
Eosinophils Relative: 4 %
HEMATOCRIT: 28.7 % — AB (ref 36.0–46.0)
HEMOGLOBIN: 9.7 g/dL — AB (ref 12.0–15.0)
LYMPHS ABS: 0.6 10*3/uL — AB (ref 0.7–4.0)
Lymphocytes Relative: 13 %
MCH: 34.6 pg — AB (ref 26.0–34.0)
MCHC: 33.8 g/dL (ref 30.0–36.0)
MCV: 102.5 fL — AB (ref 78.0–100.0)
MONO ABS: 0.4 10*3/uL (ref 0.1–1.0)
MONOS PCT: 9 %
NEUTROS ABS: 3.3 10*3/uL (ref 1.7–7.7)
NEUTROS PCT: 74 %
Platelets: 140 10*3/uL — ABNORMAL LOW (ref 150–400)
RBC: 2.8 MIL/uL — ABNORMAL LOW (ref 3.87–5.11)
RDW: 16.2 % — AB (ref 11.5–15.5)
WBC: 4.4 10*3/uL (ref 4.0–10.5)

## 2017-06-13 LAB — I-STAT CG4 LACTIC ACID, ED: LACTIC ACID, VENOUS: 1.19 mmol/L (ref 0.5–1.9)

## 2017-06-13 LAB — COMPREHENSIVE METABOLIC PANEL
ALK PHOS: 70 U/L (ref 38–126)
ALT: 18 U/L (ref 14–54)
AST: 27 U/L (ref 15–41)
Albumin: 2.8 g/dL — ABNORMAL LOW (ref 3.5–5.0)
Anion gap: 10 (ref 5–15)
BUN: 20 mg/dL (ref 6–20)
CHLORIDE: 105 mmol/L (ref 101–111)
CO2: 24 mmol/L (ref 22–32)
Calcium: 8.6 mg/dL — ABNORMAL LOW (ref 8.9–10.3)
Creatinine, Ser: 0.81 mg/dL (ref 0.44–1.00)
GFR calc Af Amer: >60 mL/min (ref >60)
GFR calc non Af Amer: >60 mL/min (ref >60)
Glucose, Bld: 115 mg/dL — ABNORMAL HIGH (ref 65–99)
Potassium: 3.5 mmol/L (ref 3.5–5.1)
Sodium: 139 mmol/L (ref 135–145)
Total Bilirubin: 0.6 mg/dL (ref 0.3–1.2)
Total Protein: 4.9 g/dL — ABNORMAL LOW (ref 6.5–8.1)

## 2017-06-13 LAB — VALPROIC ACID LEVEL: Valproic Acid Lvl: 79 ug/mL (ref 50.0–100.0)

## 2017-06-13 NOTE — ED Triage Notes (Addendum)
Per EMS pt from [facility] in [place].  Fell from [employer]. No complaints.  No LOC.  Pt has dementia at baseline. Staff stated no changes in mental status noted.  No dizziness.  Pt has no complaints. Pt wears CPAP when sleeping.  3L [place] at this time.  CBG 169.  Staff states initially pt complained of head and hip pain but no tenderness to palpation and no complaints now. No blood thinners per family.  Family states she did fall this morning as well.  LE swelling noted as new. Family states pt "sitting in recliner too much lately"

## 2017-06-13 NOTE — ED Notes (Signed)
Patient transported to CT 

## 2017-06-13 NOTE — ED Notes (Signed)
Pt's only complaint is "sore all over"

## 2017-06-13 NOTE — ED Provider Notes (Signed)
[facility] EMERGENCY DEPARTMENT Provider Note   CSN: [id number] Arrival date & time: 06/13/17  2115     History   Chief Complaint Chief Complaint  Patient presents with  . Fall    HPI Cristina Middleton is a 84 y.o. female. Level 5 caveat secondary to dementia HPI This is a female from living care facility who presents today with 2 falls and generalized weakness.  Daughter is the historian.  Daughter states that she was found on the floor.  She normally walks with a cane.  She is unable to explain why she had fallen.  There was reports that she had struck her head.  She is not complaining of any specific pain or injuries.  Denies fever or chills, nausea, vomiting, or diarrhea. Past Medical History:  Diagnosis Date  . Anxiety   . Arthritis   . Diabetes mellitus without complication ([place])   . Dislocated shoulder    WAS POPPED BACK IN PLACE  2006???  . GERD (gastroesophageal reflux disease)    TAKES  ACIPHEX  . Hyperlipidemia   . Hypertension   . Hyperthyroidism    NOT SURE WHICH ONE  . PONV (postoperative nausea and vomiting)    YRS AGO.....(6-7 YRS)  . Sleep apnea    WEARS MASK EVERY NOW AND THEN    Patient Active Problem List   Diagnosis Date Noted  . Adjustment disorder with mixed disturbance of emotions and conduct 05/07/2017  . Vascular dementia of acute onset with behavioral disturbance 05/07/2017  . Anxiety 12/09/2016  . Hyperlipidemia 12/09/2016  . Acute pancreatitis 12/08/2016  . GIB (gastrointestinal bleeding) 06/13/2016  . Bright red blood per rectum 06/13/2016  . Influenza with respiratory manifestation 06/13/2016  . Acute respiratory failure with hypoxia ([place]) 06/13/2016  . Reactive airway disease 06/13/2016  . Varicose veins of left lower extremity with complications 76/19/5093  . SOB (shortness of breath) 06/28/2014  . Abdominal pain 06/28/2014  . Encounter for nasogastric (NG) tube placement   . SBO (small bowel obstruction) ([place])  06/27/2014  . Diabetes mellitus without complication ([place]) 26/71/2458  . Acute blood loss anemia 05/26/2012  . HTN (hypertension) 05/25/2012  . Diabetes mellitus, type 2 ([place]) 05/25/2012  . Osteoarthritis of right knee 05/25/2012  . Sleep apnea 05/25/2012  . GERD (gastroesophageal reflux disease) 05/25/2012  . Hypothyroidism 05/25/2012    Past Surgical History:  Procedure Laterality Date  . BREAST SURGERY     CYST REMOVED -- LEFT  . ENDOVENOUS ABLATION SAPHENOUS VEIN W/ LASER Left 07/11/2016   endovenous laser ablation left greater saphenous vein by Tinnie Gens MD  . [place]  . LIPOMA EXCISION     FROM LEFT BACK  . TONSILLECTOMY    . TOTAL KNEE ARTHROPLASTY  05/25/2012   right knee  . TOTAL KNEE ARTHROPLASTY  05/25/2012   Procedure: TOTAL KNEE ARTHROPLASTY;  Surgeon: Yvette Rack., MD;  Location: [facility];  Service: Orthopedics;  Laterality: Right;    OB History    No data available       Home Medications    Prior to Admission medications   Medication Sig Start Date End Date Taking? Authorizing Provider  acetaminophen (TYLENOL) 325 MG tablet Take 650 mg by mouth at bedtime.    Yes [provider]  Artificial Tear Ointment ([place] PETROL-MINERAL OIL-LANOLIN) 0.1-0.1 % OINT Place 1 application into both eyes 2 (two) times daily. 06/04/17 07/04/17 Yes [provider]  aspirin EC  81 MG tablet Take 81 mg by mouth daily.    Yes [provider]  calcium carbonate (OSCAL) 1500 (600 Ca) MG TABS tablet Take 600 mg of elemental calcium by mouth daily.   Yes [provider]  divalproex (DEPAKOTE SPRINKLE) 125 MG capsule Take 125 mg by mouth 3 (three) times daily. 06/06/17 07/06/17 Yes [provider]  escitalopram (LEXAPRO) 10 MG tablet Take 10 mg by mouth daily.    Yes [provider]  ketotifen (ZADITOR) 0.025 % ophthalmic solution Place 1 drop into both eyes 2 (two) times daily. 06/04/17 07/04/17 Yes [provider]    lansoprazole (PREVACID) 3 mg/ml SUSP oral suspension Place 30 mg into feeding tube daily at 12 noon.   Yes [provider]  levothyroxine (SYNTHROID, LEVOTHROID) 100 MCG tablet Take 100 mcg by mouth daily before breakfast.   Yes [provider]  loratadine (CLARITIN) 10 MG tablet Take 10 mg by mouth daily as needed for allergies.    Yes [provider]  LORazepam (ATIVAN) 0.5 MG tablet Take 0.5 mg by mouth daily as needed. 06/07/17  Yes [provider]  Melatonin 3 MG TABS Take 3 mg by mouth at bedtime. 06/06/17 07/06/17 Yes [provider]  OLANZapine (ZYPREXA) 5 MG tablet Take 5 mg by mouth 2 (two) times daily.  06/07/17  Yes [provider]  polyethylene glycol (MIRALAX / GLYCOLAX) packet Take 17 g by mouth daily as needed for moderate constipation.   Yes [provider]  polyvinyl alcohol (LIQUIFILM TEARS) 1.4 % ophthalmic solution Place 1 drop into both eyes as needed for dry eyes.   Yes [provider]  simvastatin (ZOCOR) 20 MG tablet Take 20 mg by mouth at bedtime.    Yes [provider]  traZODone (DESYREL) 100 MG tablet Take 100 mg by mouth at bedtime. 06/07/17  Yes [provider]  vitamin B-12 (CYANOCOBALAMIN) 500 MCG tablet Take 1,000 mcg by mouth daily.    Yes [provider]    Family History Family History  Problem Relation Age of Onset  . Other Mother   . Other Father   . Breast cancer Sister   . Colon cancer Sister   . CAD Sister   . Marfan syndrome Sister   . CAD Brother   . Marfan syndrome Brother     Social History Social History   Tobacco Use  . Smoking status: Former Smoker    Packs/day: 1.00    Types: Cigarettes    Last attempt to quit: 05/02/1998    Years since quitting: 19.1  . Smokeless tobacco: Never Used  Substance Use Topics  . Alcohol use: No  . Drug use: No     Allergies   Other; Penicillins; and Sulfa antibiotics   Review of Systems Review of Systems   Unable to perform ROS: Dementia     Physical Exam Updated Vital Signs BP 126/62   Pulse 95   Temp 99.3 F (37.4 C) (Oral)   Resp (!) 24   SpO2 93%   Physical Exam  Constitutional: She appears well-developed and well-nourished. No distress.  HENT:  Head: Normocephalic and atraumatic.  Right Ear: External ear normal.  Left Ear: External ear normal.  Eyes: Pupils are equal, round, and reactive to light.  Neck: Normal range of motion.  Cardiovascular: Normal rate and regular rhythm.  Pulmonary/Chest:  Cough on exam Decreased breath sounds throughout, no rhonchi or wheezing  Abdominal: Soft. Bowel sounds are normal. She exhibits  no distension. There is no tenderness.  Musculoskeletal:  Bilateral lower extremity redness ttp over bilateral hips and knees wihtout obvious deformity or external signs of trauma  Neurological: She is alert. She displays normal reflexes. No cranial nerve deficit. She exhibits normal muscle tone. Coordination normal.  Oriented to person but not place  Skin: Skin is warm. There is erythema.  Nursing note and vitals reviewed.    ED Treatments / Results  Labs (all labs ordered are listed, but only abnormal results are displayed) Labs Reviewed  CBC WITH DIFFERENTIAL/PLATELET - Abnormal; Notable for the following components:      Result Value   RBC 2.80 (*)    Hemoglobin 9.7 (*)    HCT 28.7 (*)    MCV 102.5 (*)    MCH 34.6 (*)    RDW 16.2 (*)    Platelets 140 (*)    Lymphs Abs 0.6 (*)    All other components within normal limits  COMPREHENSIVE METABOLIC PANEL - Abnormal; Notable for the following components:   Glucose, Bld 115 (*)    Calcium 8.6 (*)    Total Protein 4.9 (*)    Albumin 2.8 (*)    All other components within normal limits  VALPROIC ACID LEVEL  URINALYSIS, ROUTINE W REFLEX MICROSCOPIC    EKG  EKG Interpretation None       Radiology Ct Head Wo Contrast  Result Date: 06/13/2017 CLINICAL DATA:  Patient fell from  recliner.  Dementia baseline. EXAM: CT HEAD WITHOUT CONTRAST CT CERVICAL SPINE WITHOUT CONTRAST TECHNIQUE: Multidetector CT imaging of the head and cervical spine was performed following the standard protocol without intravenous contrast. Multiplanar CT image reconstructions of the cervical spine were also generated. COMPARISON:  05/06/2017 FINDINGS: CT HEAD FINDINGS Brain: Redemonstration of cerebral atrophy with sulcal and ventricular prominence. Moderate chronic periventricular and subcortical white matter microvascular ischemic change is noted without large vascular territory infarct, intra-axial mass nor extra-axial fluid collections. No intracranial hemorrhage. Vascular: No hyperdense vessel or unexpected calcification. Mild-to-moderate atherosclerosis of the carotid siphons. Skull: No acute nor suspicious osseous abnormality. Sinuses/Orbits: No acute finding. Other: None CT CERVICAL SPINE FINDINGS Alignment: Maintained cervical lordosis. Intact craniocervical relationship. Osteoarthritic joint space narrowing spurring about the atlantodental. Skull base and vertebrae: No acute fracture. No primary bone lesion or focal pathologic process. Soft tissues and spinal canal: No prevertebral fluid or swelling. No visible canal hematoma. Disc levels: Moderate to marked disc space narrowing along the entirety of the cervical spine with small posterior marginal osteophytes and uncinate spurring secondary to uncovertebral joint osteoarthritis. These contribute to multilevel bilateral neural foraminal encroachment. No jumped or perched facets. Mild grade 1 anterolisthesis of C6 on C7. Upper chest: No pneumonic consolidations. Limited assessment due to patient respiratory motion. Other: Moderate atherosclerosis of the extracranial carotid arteries bilaterally. IMPRESSION: 1. No acute intracranial abnormality. 2. Atrophy with chronic microvascular ischemic disease. 3. Cervical spondylosis without acute posttraumatic  cervical spine fracture. Electronically Signed   By: Ashley Royalty M.D.   On: 06/13/2017 23:16   Ct Cervical Spine Wo Contrast  Result Date: 06/13/2017 CLINICAL DATA:  Patient fell from recliner.  Dementia baseline. EXAM: CT HEAD WITHOUT CONTRAST CT CERVICAL SPINE WITHOUT CONTRAST TECHNIQUE: Multidetector CT imaging of the head and cervical spine was performed following the standard protocol without intravenous contrast. Multiplanar CT image reconstructions of the cervical spine were also generated. COMPARISON:  05/06/2017 FINDINGS: CT HEAD FINDINGS Brain: Redemonstration of cerebral atrophy with sulcal and ventricular prominence. Moderate chronic periventricular and  subcortical white matter microvascular ischemic change is noted without large vascular territory infarct, intra-axial mass nor extra-axial fluid collections. No intracranial hemorrhage. Vascular: No hyperdense vessel or unexpected calcification. Mild-to-moderate atherosclerosis of the carotid siphons. Skull: No acute nor suspicious osseous abnormality. Sinuses/Orbits: No acute finding. Other: None CT CERVICAL SPINE FINDINGS Alignment: Maintained cervical lordosis. Intact craniocervical relationship. Osteoarthritic joint space narrowing spurring about the atlantodental. Skull base and vertebrae: No acute fracture. No primary bone lesion or focal pathologic process. Soft tissues and spinal canal: No prevertebral fluid or swelling. No visible canal hematoma. Disc levels: Moderate to marked disc space narrowing along the entirety of the cervical spine with small posterior marginal osteophytes and uncinate spurring secondary to uncovertebral joint osteoarthritis. These contribute to multilevel bilateral neural foraminal encroachment. No jumped or perched facets. Mild grade 1 anterolisthesis of C6 on C7. Upper chest: No pneumonic consolidations. Limited assessment due to patient respiratory motion. Other: Moderate atherosclerosis of the extracranial carotid  arteries bilaterally. IMPRESSION: 1. No acute intracranial abnormality. 2. Atrophy with chronic microvascular ischemic disease. 3. Cervical spondylosis without acute posttraumatic cervical spine fracture. Electronically Signed   By: Ashley Royalty M.D.   On: 06/13/2017 23:16    Procedures Procedures (including critical care time)  Medications Ordered in ED Medications - No data to display   Initial Impression / Assessment and Plan / ED Course  I have reviewed the triage vital signs and the nursing notes.  Pertinent labs & imaging results that were available during my care of the patient were reviewed by me and considered in my medical decision making (see chart for details).     84 y.o female from al care facility with increased weakness and falls today.  Patient with fever here to 100.8.   1- fever- awaiting cxr and urinalysis no tachycardia or hypotension. Will treat with abx appropriate to source 2- anemia- hgb to 9 from prior 12, hemocult pending 3- generalized weakness and falls- likely secondary to #1- radiology studies of extremities pending. CT head and neck wihtout acute changes.  Discussed with Dr. Leonides Schanz and she will follow up results and call for admission.   Final Clinical Impressions(s) / ED Diagnoses   Final diagnoses:  Fall, initial encounter  Fever, unspecified fever cause  Anemia, unspecified type    ED Discharge Orders    None       Pattricia Boss, MD 06/14/17 0005

## 2017-06-14 ENCOUNTER — Encounter (HOSPITAL_COMMUNITY): Payer: Self-pay | Admitting: Family Medicine

## 2017-06-14 DIAGNOSIS — E119 Type 2 diabetes mellitus without complications: Secondary | ICD-10-CM | POA: Diagnosis not present

## 2017-06-14 DIAGNOSIS — J9601 Acute respiratory failure with hypoxia: Secondary | ICD-10-CM | POA: Diagnosis present

## 2017-06-14 DIAGNOSIS — I5033 Acute on chronic diastolic (congestive) heart failure: Secondary | ICD-10-CM | POA: Diagnosis present

## 2017-06-14 DIAGNOSIS — D696 Thrombocytopenia, unspecified: Secondary | ICD-10-CM | POA: Diagnosis present

## 2017-06-14 DIAGNOSIS — I2510 Atherosclerotic heart disease of native coronary artery without angina pectoris: Secondary | ICD-10-CM | POA: Diagnosis present

## 2017-06-14 DIAGNOSIS — R296 Repeated falls: Secondary | ICD-10-CM

## 2017-06-14 DIAGNOSIS — E039 Hypothyroidism, unspecified: Secondary | ICD-10-CM | POA: Diagnosis present

## 2017-06-14 DIAGNOSIS — R2681 Unsteadiness on feet: Secondary | ICD-10-CM | POA: Diagnosis not present

## 2017-06-14 DIAGNOSIS — Z7189 Other specified counseling: Secondary | ICD-10-CM | POA: Diagnosis not present

## 2017-06-14 DIAGNOSIS — G4730 Sleep apnea, unspecified: Secondary | ICD-10-CM | POA: Diagnosis not present

## 2017-06-14 DIAGNOSIS — I110 Hypertensive heart disease with heart failure: Secondary | ICD-10-CM | POA: Diagnosis present

## 2017-06-14 DIAGNOSIS — R6510 Systemic inflammatory response syndrome (SIRS) of non-infectious origin without acute organ dysfunction: Secondary | ICD-10-CM

## 2017-06-14 DIAGNOSIS — F0391 Unspecified dementia with behavioral disturbance: Secondary | ICD-10-CM | POA: Diagnosis not present

## 2017-06-14 DIAGNOSIS — I509 Heart failure, unspecified: Secondary | ICD-10-CM | POA: Diagnosis not present

## 2017-06-14 DIAGNOSIS — F329 Major depressive disorder, single episode, unspecified: Secondary | ICD-10-CM | POA: Diagnosis present

## 2017-06-14 DIAGNOSIS — D649 Anemia, unspecified: Secondary | ICD-10-CM | POA: Diagnosis not present

## 2017-06-14 DIAGNOSIS — R509 Fever, unspecified: Secondary | ICD-10-CM | POA: Diagnosis not present

## 2017-06-14 DIAGNOSIS — E11649 Type 2 diabetes mellitus with hypoglycemia without coma: Secondary | ICD-10-CM | POA: Diagnosis not present

## 2017-06-14 DIAGNOSIS — G4733 Obstructive sleep apnea (adult) (pediatric): Secondary | ICD-10-CM | POA: Diagnosis present

## 2017-06-14 DIAGNOSIS — R278 Other lack of coordination: Secondary | ICD-10-CM | POA: Diagnosis not present

## 2017-06-14 DIAGNOSIS — F419 Anxiety disorder, unspecified: Secondary | ICD-10-CM | POA: Diagnosis present

## 2017-06-14 DIAGNOSIS — J101 Influenza due to other identified influenza virus with other respiratory manifestations: Secondary | ICD-10-CM | POA: Diagnosis not present

## 2017-06-14 DIAGNOSIS — R4182 Altered mental status, unspecified: Secondary | ICD-10-CM | POA: Diagnosis not present

## 2017-06-14 DIAGNOSIS — R05 Cough: Secondary | ICD-10-CM | POA: Diagnosis not present

## 2017-06-14 DIAGNOSIS — F0390 Unspecified dementia without behavioral disturbance: Secondary | ICD-10-CM | POA: Diagnosis not present

## 2017-06-14 DIAGNOSIS — D469 Myelodysplastic syndrome, unspecified: Secondary | ICD-10-CM | POA: Diagnosis not present

## 2017-06-14 DIAGNOSIS — J810 Acute pulmonary edema: Secondary | ICD-10-CM | POA: Diagnosis not present

## 2017-06-14 DIAGNOSIS — K219 Gastro-esophageal reflux disease without esophagitis: Secondary | ICD-10-CM | POA: Diagnosis present

## 2017-06-14 DIAGNOSIS — J189 Pneumonia, unspecified organism: Secondary | ICD-10-CM | POA: Diagnosis not present

## 2017-06-14 DIAGNOSIS — I10 Essential (primary) hypertension: Secondary | ICD-10-CM | POA: Diagnosis not present

## 2017-06-14 DIAGNOSIS — D539 Nutritional anemia, unspecified: Secondary | ICD-10-CM | POA: Diagnosis present

## 2017-06-14 DIAGNOSIS — F0151 Vascular dementia with behavioral disturbance: Secondary | ICD-10-CM | POA: Diagnosis present

## 2017-06-14 DIAGNOSIS — E785 Hyperlipidemia, unspecified: Secondary | ICD-10-CM | POA: Diagnosis present

## 2017-06-14 DIAGNOSIS — W19XXXA Unspecified fall, initial encounter: Secondary | ICD-10-CM | POA: Diagnosis not present

## 2017-06-14 DIAGNOSIS — G4700 Insomnia, unspecified: Secondary | ICD-10-CM | POA: Diagnosis present

## 2017-06-14 DIAGNOSIS — R1312 Dysphagia, oropharyngeal phase: Secondary | ICD-10-CM | POA: Diagnosis not present

## 2017-06-14 DIAGNOSIS — J811 Chronic pulmonary edema: Secondary | ICD-10-CM | POA: Diagnosis not present

## 2017-06-14 DIAGNOSIS — F4325 Adjustment disorder with mixed disturbance of emotions and conduct: Secondary | ICD-10-CM | POA: Diagnosis present

## 2017-06-14 DIAGNOSIS — M6281 Muscle weakness (generalized): Secondary | ICD-10-CM | POA: Diagnosis not present

## 2017-06-14 DIAGNOSIS — E8809 Other disorders of plasma-protein metabolism, not elsewhere classified: Secondary | ICD-10-CM | POA: Diagnosis present

## 2017-06-14 DIAGNOSIS — Z66 Do not resuscitate: Secondary | ICD-10-CM | POA: Diagnosis not present

## 2017-06-14 DIAGNOSIS — R627 Adult failure to thrive: Secondary | ICD-10-CM | POA: Diagnosis present

## 2017-06-14 DIAGNOSIS — I361 Nonrheumatic tricuspid (valve) insufficiency: Secondary | ICD-10-CM | POA: Diagnosis not present

## 2017-06-14 DIAGNOSIS — Z515 Encounter for palliative care: Secondary | ICD-10-CM | POA: Diagnosis not present

## 2017-06-14 DIAGNOSIS — J90 Pleural effusion, not elsewhere classified: Secondary | ICD-10-CM | POA: Diagnosis not present

## 2017-06-14 DIAGNOSIS — R443 Hallucinations, unspecified: Secondary | ICD-10-CM | POA: Diagnosis not present

## 2017-06-14 DIAGNOSIS — J09X2 Influenza due to identified novel influenza A virus with other respiratory manifestations: Secondary | ICD-10-CM | POA: Diagnosis present

## 2017-06-14 DIAGNOSIS — G9341 Metabolic encephalopathy: Secondary | ICD-10-CM | POA: Diagnosis present

## 2017-06-14 DIAGNOSIS — J9811 Atelectasis: Secondary | ICD-10-CM | POA: Diagnosis not present

## 2017-06-14 LAB — IRON AND TIBC
IRON: 41 ug/dL (ref 28–170)
SATURATION RATIOS: 20 % (ref 10.4–31.8)
TIBC: 203 ug/dL — AB (ref 250–450)
UIBC: 162 ug/dL

## 2017-06-14 LAB — RESPIRATORY PANEL BY PCR
Adenovirus: NOT DETECTED
BORDETELLA PERTUSSIS-RVPCR: NOT DETECTED
CORONAVIRUS OC43-RVPPCR: NOT DETECTED
Chlamydophila pneumoniae: NOT DETECTED
Coronavirus 229E: NOT DETECTED
Coronavirus HKU1: NOT DETECTED
Coronavirus NL63: NOT DETECTED
INFLUENZA A H1 2009-RVPPR: NOT DETECTED
INFLUENZA A H1-RVPPCR: NOT DETECTED
Influenza A H3: NOT DETECTED
Influenza A: NOT DETECTED
Influenza B: NOT DETECTED
METAPNEUMOVIRUS-RVPPCR: NOT DETECTED
MYCOPLASMA PNEUMONIAE-RVPPCR: NOT DETECTED
PARAINFLUENZA VIRUS 1-RVPPCR: NOT DETECTED
PARAINFLUENZA VIRUS 2-RVPPCR: NOT DETECTED
Parainfluenza Virus 3: NOT DETECTED
Parainfluenza Virus 4: NOT DETECTED
RESPIRATORY SYNCYTIAL VIRUS-RVPPCR: NOT DETECTED
RHINOVIRUS / ENTEROVIRUS - RVPPCR: NOT DETECTED

## 2017-06-14 LAB — CBC WITH DIFFERENTIAL/PLATELET
BASOS ABS: 0 10*3/uL (ref 0.0–0.1)
BASOS PCT: 0 %
Eosinophils Absolute: 0.2 10*3/uL (ref 0.0–0.7)
Eosinophils Relative: 4 %
HEMATOCRIT: 27.9 % — AB (ref 36.0–46.0)
HEMOGLOBIN: 9.1 g/dL — AB (ref 12.0–15.0)
LYMPHS PCT: 11 %
Lymphs Abs: 0.6 10*3/uL — ABNORMAL LOW (ref 0.7–4.0)
MCH: 33.3 pg (ref 26.0–34.0)
MCHC: 32.6 g/dL (ref 30.0–36.0)
MCV: 102.2 fL — AB (ref 78.0–100.0)
Monocytes Absolute: 0.7 10*3/uL (ref 0.1–1.0)
Monocytes Relative: 14 %
NEUTROS ABS: 3.5 10*3/uL (ref 1.7–7.7)
NEUTROS PCT: 71 %
Platelets: 144 10*3/uL — ABNORMAL LOW (ref 150–400)
RBC: 2.73 MIL/uL — AB (ref 3.87–5.11)
RDW: 16.1 % — AB (ref 11.5–15.5)
WBC: 5 10*3/uL (ref 4.0–10.5)

## 2017-06-14 LAB — URINALYSIS, ROUTINE W REFLEX MICROSCOPIC
BILIRUBIN URINE: NEGATIVE
Glucose, UA: NEGATIVE mg/dL
HGB URINE DIPSTICK: NEGATIVE
Ketones, ur: NEGATIVE mg/dL
Leukocytes, UA: NEGATIVE
NITRITE: NEGATIVE
PROTEIN: NEGATIVE mg/dL
SPECIFIC GRAVITY, URINE: 1.025 (ref 1.005–1.030)
pH: 5 (ref 5.0–8.0)

## 2017-06-14 LAB — BASIC METABOLIC PANEL
ANION GAP: 12 (ref 5–15)
BUN: 18 mg/dL (ref 6–20)
CO2: 23 mmol/L (ref 22–32)
Calcium: 8.2 mg/dL — ABNORMAL LOW (ref 8.9–10.3)
Chloride: 104 mmol/L (ref 101–111)
Creatinine, Ser: 0.68 mg/dL (ref 0.44–1.00)
GFR calc non Af Amer: >60 mL/min (ref >60)
GLUCOSE: 80 mg/dL (ref 65–99)
POTASSIUM: 3.5 mmol/L (ref 3.5–5.1)
Sodium: 139 mmol/L (ref 135–145)

## 2017-06-14 LAB — VITAMIN B12: Vitamin B-12: 1331 pg/mL — ABNORMAL HIGH (ref 180–914)

## 2017-06-14 LAB — APTT: APTT: 28 s (ref 24–36)

## 2017-06-14 LAB — I-STAT CG4 LACTIC ACID, ED: Lactic Acid, Venous: 0.88 mmol/L (ref 0.5–1.9)

## 2017-06-14 LAB — PROCALCITONIN: PROCALCITONIN: 0.1 ng/mL

## 2017-06-14 LAB — RETICULOCYTES
RBC.: 2.73 MIL/uL — ABNORMAL LOW (ref 3.87–5.11)
RETIC COUNT ABSOLUTE: 43.7 10*3/uL (ref 19.0–186.0)
Retic Ct Pct: 1.6 % (ref 0.4–3.1)

## 2017-06-14 LAB — FOLATE: FOLATE: 23.1 ng/mL (ref >5.9)

## 2017-06-14 LAB — FERRITIN: Ferritin: 336 ng/mL — ABNORMAL HIGH (ref 11–307)

## 2017-06-14 LAB — LIPASE, BLOOD: Lipase: 29 U/L (ref 11–51)

## 2017-06-14 LAB — POC OCCULT BLOOD, ED: FECAL OCCULT BLD: NEGATIVE

## 2017-06-14 MED ORDER — MELATONIN 3 MG PO TABS
3.0000 mg | ORAL_TABLET | Freq: Every day | ORAL | Status: DC
  Administered 2017-06-14 – 2017-06-20 (×7): 3 mg via ORAL
  Filled 2017-06-14 (×10): qty 1

## 2017-06-14 MED ORDER — SIMVASTATIN 20 MG PO TABS
20.0000 mg | ORAL_TABLET | Freq: Every day | ORAL | Status: DC
  Administered 2017-06-14 – 2017-06-20 (×7): 20 mg via ORAL
  Filled 2017-06-14 (×7): qty 1

## 2017-06-14 MED ORDER — VANCOMYCIN HCL IN DEXTROSE 750-5 MG/150ML-% IV SOLN: 750.0000 mg | INTRAVENOUS | Status: DC

## 2017-06-14 MED ORDER — VANCOMYCIN HCL IN DEXTROSE 750-5 MG/150ML-% IV SOLN
750.0000 mg | INTRAVENOUS | Status: DC
  Administered 2017-06-15 – 2017-06-16 (×2): 750 mg via INTRAVENOUS
  Filled 2017-06-14 (×2): qty 150

## 2017-06-14 MED ORDER — LORATADINE 10 MG PO TABS: 10.0000 mg | ORAL_TABLET | Freq: Every day | ORAL | Status: DC | PRN

## 2017-06-14 MED ORDER — SODIUM CHLORIDE 0.9 % IV SOLN
250.0000 mL | INTRAVENOUS | Status: DC | PRN
  Administered 2017-06-14 – 2017-06-16 (×2): 250 mL via INTRAVENOUS

## 2017-06-14 MED ORDER — CALCIUM CARBONATE 1500 (600 CA) MG PO TABS
600.0000 mg | ORAL_TABLET | Freq: Every day | ORAL | Status: DC
  Filled 2017-06-14: qty 1

## 2017-06-14 MED ORDER — POLYETHYLENE GLYCOL 3350 17 G PO PACK
17.0000 g | PACK | Freq: Every day | ORAL | Status: DC | PRN
  Administered 2017-06-19: 17 g via ORAL
  Filled 2017-06-14: qty 1

## 2017-06-14 MED ORDER — ONDANSETRON HCL 4 MG/2ML IJ SOLN: 4.0000 mg | Freq: Four times a day (QID) | INTRAMUSCULAR | Status: DC | PRN

## 2017-06-14 MED ORDER — VITAMIN B-12 500 MCG PO TABS: 1000.0000 ug | ORAL_TABLET | Freq: Every day | ORAL | Status: DC

## 2017-06-14 MED ORDER — ACETAMINOPHEN 325 MG PO TABS
650.0000 mg | ORAL_TABLET | Freq: Four times a day (QID) | ORAL | Status: DC | PRN
  Administered 2017-06-16 – 2017-06-20 (×3): 650 mg via ORAL
  Filled 2017-06-14 (×3): qty 2

## 2017-06-14 MED ORDER — VITAMIN B-12 1000 MCG PO TABS
1000.0000 ug | ORAL_TABLET | Freq: Every day | ORAL | Status: DC
  Administered 2017-06-14 – 2017-06-22 (×9): 1000 ug via ORAL
  Filled 2017-06-14 (×9): qty 1

## 2017-06-14 MED ORDER — SODIUM CHLORIDE 0.9 % IV SOLN
2.0000 g | Freq: Once | INTRAVENOUS | Status: AC
  Administered 2017-06-14: 2 g via INTRAVENOUS
  Filled 2017-06-14: qty 2

## 2017-06-14 MED ORDER — OLANZAPINE 5 MG PO TABS
5.0000 mg | ORAL_TABLET | Freq: Two times a day (BID) | ORAL | Status: DC
  Administered 2017-06-14 – 2017-06-15 (×3): 5 mg via ORAL
  Filled 2017-06-14 (×5): qty 1

## 2017-06-14 MED ORDER — HYDROCODONE-ACETAMINOPHEN 5-325 MG PO TABS
1.0000 | ORAL_TABLET | ORAL | Status: DC | PRN
  Administered 2017-06-17 – 2017-06-21 (×6): 2 via ORAL
  Filled 2017-06-14 (×5): qty 2
  Filled 2017-06-14: qty 1
  Filled 2017-06-14 (×2): qty 2

## 2017-06-14 MED ORDER — LANSOPRAZOLE 3 MG/ML SUSP: 30.0000 mg | Freq: Every day | ORAL | Status: DC

## 2017-06-14 MED ORDER — PANTOPRAZOLE SODIUM 40 MG PO PACK
40.0000 mg | PACK | Freq: Every day | ORAL | Status: DC
  Administered 2017-06-14 – 2017-06-21 (×8): 40 mg
  Filled 2017-06-14 (×8): qty 20

## 2017-06-14 MED ORDER — LEVOTHYROXINE SODIUM 100 MCG PO TABS
100.0000 ug | ORAL_TABLET | Freq: Every day | ORAL | Status: DC
  Administered 2017-06-14 – 2017-06-22 (×9): 100 ug via ORAL
  Filled 2017-06-14 (×9): qty 1

## 2017-06-14 MED ORDER — WH PETROL-MINERAL OIL-LANOLIN 0.1-0.1 % OP OINT: 1.0000 "application " | TOPICAL_OINTMENT | Freq: Two times a day (BID) | OPHTHALMIC | Status: DC

## 2017-06-14 MED ORDER — TRAZODONE HCL 100 MG PO TABS
100.0000 mg | ORAL_TABLET | Freq: Every day | ORAL | Status: DC
  Administered 2017-06-14 – 2017-06-15 (×2): 100 mg via ORAL
  Filled 2017-06-14 (×2): qty 1

## 2017-06-14 MED ORDER — LORAZEPAM 0.5 MG PO TABS
0.5000 mg | ORAL_TABLET | Freq: Every day | ORAL | Status: DC | PRN
  Administered 2017-06-14 – 2017-06-20 (×4): 0.5 mg via ORAL
  Filled 2017-06-14 (×4): qty 1

## 2017-06-14 MED ORDER — SODIUM CHLORIDE 0.9% FLUSH
3.0000 mL | INTRAVENOUS | Status: DC | PRN
  Administered 2017-06-14 – 2017-06-20 (×3): 3 mL via INTRAVENOUS
  Filled 2017-06-14 (×3): qty 3

## 2017-06-14 MED ORDER — ARTIFICIAL TEARS OPHTHALMIC OINT
TOPICAL_OINTMENT | Freq: Two times a day (BID) | OPHTHALMIC | Status: DC
  Administered 2017-06-14 – 2017-06-22 (×15): via OPHTHALMIC
  Filled 2017-06-14: qty 3.5

## 2017-06-14 MED ORDER — ESCITALOPRAM OXALATE 10 MG PO TABS
10.0000 mg | ORAL_TABLET | Freq: Every day | ORAL | Status: DC
  Administered 2017-06-15 – 2017-06-22 (×8): 10 mg via ORAL
  Filled 2017-06-14 (×8): qty 1

## 2017-06-14 MED ORDER — ONDANSETRON HCL 4 MG PO TABS: 4.0000 mg | ORAL_TABLET | Freq: Four times a day (QID) | ORAL | Status: DC | PRN

## 2017-06-14 MED ORDER — SODIUM CHLORIDE 0.9% FLUSH
3.0000 mL | Freq: Two times a day (BID) | INTRAVENOUS | Status: DC
  Administered 2017-06-14 – 2017-06-22 (×12): 3 mL via INTRAVENOUS

## 2017-06-14 MED ORDER — ACETAMINOPHEN 650 MG RE SUPP: 650.0000 mg | Freq: Four times a day (QID) | RECTAL | Status: DC | PRN

## 2017-06-14 MED ORDER — LEVOFLOXACIN IN D5W 750 MG/150ML IV SOLN
750.0000 mg | Freq: Once | INTRAVENOUS | Status: AC
  Administered 2017-06-14: 750 mg via INTRAVENOUS
  Filled 2017-06-14: qty 150

## 2017-06-14 MED ORDER — SODIUM CHLORIDE 0.9 % IV SOLN
1.0000 g | Freq: Three times a day (TID) | INTRAVENOUS | Status: DC
  Administered 2017-06-14 – 2017-06-16 (×7): 1 g via INTRAVENOUS
  Filled 2017-06-14 (×9): qty 1

## 2017-06-14 MED ORDER — VANCOMYCIN HCL IN DEXTROSE 1-5 GM/200ML-% IV SOLN
1000.0000 mg | Freq: Once | INTRAVENOUS | Status: AC
  Administered 2017-06-14: 1000 mg via INTRAVENOUS
  Filled 2017-06-14: qty 200

## 2017-06-14 MED ORDER — KETOTIFEN FUMARATE 0.025 % OP SOLN
1.0000 [drp] | Freq: Two times a day (BID) | OPHTHALMIC | Status: DC
  Administered 2017-06-14 – 2017-06-22 (×16): 1 [drp] via OPHTHALMIC
  Filled 2017-06-14: qty 5

## 2017-06-14 MED ORDER — DIVALPROEX SODIUM 125 MG PO CSDR
125.0000 mg | DELAYED_RELEASE_CAPSULE | Freq: Three times a day (TID) | ORAL | Status: DC
  Administered 2017-06-14 – 2017-06-15 (×5): 125 mg via ORAL
  Filled 2017-06-14 (×8): qty 1

## 2017-06-14 MED ORDER — CALCIUM CARBONATE 1250 (500 CA) MG PO TABS
500.0000 mg | ORAL_TABLET | Freq: Every day | ORAL | Status: DC
  Administered 2017-06-14 – 2017-06-22 (×9): 500 mg via ORAL
  Filled 2017-06-14 (×10): qty 1

## 2017-06-14 MED ORDER — LEVOFLOXACIN IN D5W 750 MG/150ML IV SOLN
750.0000 mg | INTRAVENOUS | Status: DC
  Filled 2017-06-14: qty 150

## 2017-06-14 MED ORDER — POLYVINYL ALCOHOL 1.4 % OP SOLN: 1.0000 [drp] | OPHTHALMIC | Status: DC | PRN

## 2017-06-14 NOTE — Progress Notes (Signed)
Pharmacy Antibiotic Note  Cristina Middleton is a 84 y.o. female admitted on 06/13/2017 with sepsis.  Pharmacy has been consulted for Vancocin, Levaquin, and Azactam dosing.  Plan: Vancomycin 1000mg  x1 then 750mg  IV every 24 hours.  Goal trough 15-20 mcg/mL.  Levaquin 750mg  IV every 48 hours. Azactam 2g x1 then 1g IV every 8 hours.  Height: 5\' 3"  (160 cm) Weight: 124 lb (56.2 kg) IBW/kg (Calculated) : 52.4  Temp (24hrs), Avg:100.1 F (37.8 C), Min:99.3 F (37.4 C), Max:100.8 F (38.2 C)  Recent Labs  Lab 06/13/17 2230 06/14/17 0003 06/14/17 0204  WBC 4.4  --   --   CREATININE 0.81  --   --   LATICACIDVEN  --  1.19 0.88    Estimated Creatinine Clearance: 35.1 mL/min (by C-G formula based on SCr of 0.81 mg/dL).    Allergies  Allergen Reactions  . Other Other (See Comments)    Pt is allergic to Alka-Seltzer tablets.   Reaction:  Hallucinations   . Penicillins Hives, Itching, Swelling and Other (See Comments)    Reaction:  Unspecified swelling reaction Has patient had a PCN reaction causing immediate rash, facial/tongue/throat swelling, SOB or lightheadedness with hypotension: Yes Has patient had a PCN reaction causing severe rash involving mucus membranes or skin necrosis: No Has patient had a PCN reaction that required hospitalization No Has patient had a PCN reaction occurring within the last 10 years: No If all of the above answers are "NO", then may proceed with Cephalosporin use.  . Sulfa Antibiotics Hives, Itching, Swelling and Other (See Comments)    Reaction:  Unspecified swelling reaction     Thank you for allowing pharmacy to be a part of this patient's care.  Wynona Neat, PharmD, BCPS  06/14/2017 3:49 AM

## 2017-06-14 NOTE — ED Notes (Signed)
Notified [facility] of need for medications to be verified.

## 2017-06-14 NOTE — Care Management Note (Signed)
Case Management Note  Patient Details  Name: Cristina Middleton MRN: [id number] Date of Birth: [birth year]  Subjective/Objective:                  84 y.o. female with medical history significant for dementia, depression, anxiety, and OSA on CPAP, now presenting to the emergency department with generalized weakness and recurrent falls. From [facility]  Action/Plan: Admit status INPATIENT (SEPSIS); anticipate discharge SNF.  Expected Discharge Date:  (TBD)               Expected Discharge Plan:  Skilled Nursing Facility  In-House Referral:  Clinical Social Work  Discharge planning Services  CM Consult  Status of Service:  In process, will continue to follow  If discussed at Long Length of Stay Meetings, dates discussed:    Additional Comments: PCP: Delsa Sale, RN 06/14/2017, 1:50 PM

## 2017-06-14 NOTE — Progress Notes (Signed)
Patient was admitted earlier today.  Apparently, the patient was lethargic and found down at the facility.  We will continue current workup. T-max is 100.8.  The potassium is 3.5.  Albumin is decreased at 2.8.  Pro-calcitonin is less than 0.1.  Hemoglobin is 9.5, MCV of 102.2 with low platelet as well.  Hemoglobin is 9.1. Patient is on multiple mind altering medications including trazodone, olanzapine, Ativan, loratadine, Norco, Depakote, Lexapro. Patient is volume depleted.  We will continue IV fluids for now. We will repeat chest x-ray in the morning. Further management will depend on above.  The patient may be discharged back to the facility in 1-2 days

## 2017-06-14 NOTE — Progress Notes (Signed)
Patient not placed on CPAP due to mental status. Confused, combative, removing gown, and pulling at things.

## 2017-06-14 NOTE — ED Provider Notes (Signed)
12:45 AM  Assumed care from Dr. Jeanell Sparrow.  Patient is a 84 year old female who presents to the emergency department from an assisted living facility with 2 falls today and generalized weakness.  Patient has history of dementia.  Daughter provides most of the history.  Imaging shows no acute abnormality.  Patient found to be febrile here without obvious source.  Chest x-ray clear.  Urine shows no sign of infection.  Flu swab and blood cultures pending.  Plan was to admit patient and if no source found to start broad-spectrum antibiotics.  Family agrees with this plan.  Will discuss with hospitalist.  1:08 AM Discussed patient's case with hospitalist, Dr. Myna Hidalgo.  I have recommended admission and patient (and family if present) agree with this plan. Admitting physician will place admission orders.   I reviewed all nursing notes, vitals, pertinent previous records, EKGs, lab and urine results, imaging (as available).     Ward, Delice Bison, DO 06/14/17 [phone]

## 2017-06-14 NOTE — H&P (Signed)
History and Physical    Cristina Middleton OXB:[id number] DOB: 1921/01/24 DOA: 06/13/2017  PCP: Jani Gravel, MD   [facility] coming from: [name]  Chief Complaint: Gen weakness, cough, recurrent falls   HPI: Cristina Middleton is a 84 y.o. female with medical history significant for dementia, depression, anxiety, and OSA on CPAP, now presenting to the emergency department with generalized weakness and recurrent falls.  Patient resides at a nursing facility, is unsteady on her feet and prone to falls at baseline, but has been more lethargic for the past couple days and slipped out of her recliner this evening.  She was also found down on the floor of her room after an unwitnessed fall the day before.  She has complained of some aches and pains after the fall, and has also been noted to have developed a mild cough.    ED Course: Upon arrival to the ED, patient is found to be febrile to 38.2 C, saturating 87% on room air, and with vitals otherwise normal.  EKG features a sinus rhythm with PVCs and a chest x-ray is notable for bronchovascular crowding bronchial cuffing, possibly secondary to low lung volumes or bronchial inflammation.  Noncontrast head CT is negative for acute intracranial abnormality.  Cervical spine CT is negative for acute pathology.  Radiographs of the knees, hips, and.  Chemistry panel is notable for an albumin of 2.8 and CBC features a macrocytic anemia with hemoglobin of 9.7, down from 12.8 last month.  Urinalysis is unremarkable and lactic acid is reassuringly normal.  Blood cultures were collected and the patient was treated with Levaquin, vancomycin, and aztreonam.  She remains hemodynamically stable, in no apparent distress on 2 L/min via nasal cannula, and she will be admitted to the medical-surgical unit for ongoing evaluation and management of sepsis with suspected pulmonary source, possibly viral.  Review of Systems:  All other systems reviewed and apart from HPI, are  negative.  Past Medical History:  Diagnosis Date  . Anxiety   . Arthritis   . Diabetes mellitus without complication ([place])   . Dislocated shoulder    WAS POPPED BACK IN PLACE  2006???  . GERD (gastroesophageal reflux disease)    TAKES  ACIPHEX  . Hyperlipidemia   . Hypertension   . Hyperthyroidism    NOT SURE WHICH ONE  . PONV (postoperative nausea and vomiting)    YRS AGO.....(6-7 YRS)  . Sleep apnea    WEARS MASK EVERY NOW AND THEN    Past Surgical History:  Procedure Laterality Date  . BREAST SURGERY     CYST REMOVED -- LEFT  . ENDOVENOUS ABLATION SAPHENOUS VEIN W/ LASER Left 07/11/2016   endovenous laser ablation left greater saphenous vein by Tinnie Gens MD  . [place]  . LIPOMA EXCISION     FROM LEFT BACK  . TONSILLECTOMY    . TOTAL KNEE ARTHROPLASTY  05/25/2012   right knee  . TOTAL KNEE ARTHROPLASTY  05/25/2012   Procedure: TOTAL KNEE ARTHROPLASTY;  Surgeon: Yvette Rack., MD;  Location: [facility];  Service: Orthopedics;  Laterality: Right;     reports that she quit smoking about 19 years ago. Her smoking use included cigarettes. She smoked 1.00 pack per day. she has never used smokeless tobacco. She reports that she does not drink alcohol or use drugs.  Allergies  Allergen Reactions  . Other Other (See Comments)    Pt is allergic to Alka-Seltzer tablets.  Reaction:  Hallucinations   . Penicillins Hives, Itching, Swelling and Other (See Comments)    Reaction:  Unspecified swelling reaction Has patient had a PCN reaction causing immediate rash, facial/tongue/throat swelling, SOB or lightheadedness with hypotension: Yes Has patient had a PCN reaction causing severe rash involving mucus membranes or skin necrosis: No Has patient had a PCN reaction that required hospitalization No Has patient had a PCN reaction occurring within the last 10 years: No If all of the above answers are "NO", then may proceed with Cephalosporin use.  . Sulfa  Antibiotics Hives, Itching, Swelling and Other (See Comments)    Reaction:  Unspecified swelling reaction    Family History  Problem Relation Age of Onset  . Other Mother   . Other Father   . Breast cancer Sister   . Colon cancer Sister   . CAD Sister   . Marfan syndrome Sister   . CAD Brother   . Marfan syndrome Brother      Prior to Admission medications   Medication Sig Start Date End Date Taking? Authorizing Provider  acetaminophen (TYLENOL) 325 MG tablet Take 650 mg by mouth at bedtime.    Yes [provider]  Artificial Tear Ointment ([place] PETROL-MINERAL OIL-LANOLIN) 0.1-0.1 % OINT Place 1 application into both eyes 2 (two) times daily. 06/04/17 07/04/17 Yes [provider]  aspirin EC 81 MG tablet Take 81 mg by mouth daily.    Yes [provider]  calcium carbonate (OSCAL) 1500 (600 Ca) MG TABS tablet Take 600 mg of elemental calcium by mouth daily.   Yes [provider]  divalproex (DEPAKOTE SPRINKLE) 125 MG capsule Take 125 mg by mouth 3 (three) times daily. 06/06/17 07/06/17 Yes [provider]  escitalopram (LEXAPRO) 10 MG tablet Take 10 mg by mouth daily.    Yes [provider]  ketotifen (ZADITOR) 0.025 % ophthalmic solution Place 1 drop into both eyes 2 (two) times daily. 06/04/17 07/04/17 Yes [provider]  lansoprazole (PREVACID) 3 mg/ml SUSP oral suspension Place 30 mg into feeding tube daily at 12 noon.   Yes [provider]  levothyroxine (SYNTHROID, LEVOTHROID) 100 MCG tablet Take 100 mcg by mouth daily before breakfast.   Yes [provider]  loratadine (CLARITIN) 10 MG tablet Take 10 mg by mouth daily as needed for allergies.    Yes [provider]  LORazepam (ATIVAN) 0.5 MG tablet Take 0.5 mg by mouth daily as needed. 06/07/17  Yes [provider]  Melatonin 3 MG TABS Take 3 mg by mouth at bedtime. 06/06/17 07/06/17 Yes [provider]  OLANZapine (ZYPREXA) 5 MG tablet Take  5 mg by mouth 2 (two) times daily.  06/07/17  Yes [provider]  polyethylene glycol (MIRALAX / GLYCOLAX) packet Take 17 g by mouth daily as needed for moderate constipation.   Yes [provider]  polyvinyl alcohol (LIQUIFILM TEARS) 1.4 % ophthalmic solution Place 1 drop into both eyes as needed for dry eyes.   Yes [provider]  simvastatin (ZOCOR) 20 MG tablet Take 20 mg by mouth at bedtime.    Yes [provider]  traZODone (DESYREL) 100 MG tablet Take 100 mg by mouth at bedtime. 06/07/17  Yes [provider]  vitamin B-12 (CYANOCOBALAMIN) 500 MCG tablet Take 1,000 mcg by mouth daily.    Yes [provider]    Physical Exam: Vitals:   06/14/17 0145 06/14/17 0200 06/14/17 0215 06/14/17 0230  BP: 132/80 127/62 (!) 131/41 Marland Kitchen)  141/95  Pulse: 94 96 95 98  Resp: (!) 23 (!) 26 18 (!) 27  Temp:      TempSrc:      SpO2: 96% 93% 95% 92%  Weight:      Height:          Constitutional: NAD, calm  Eyes: PERTLA, lids and conjunctivae normal ENMT: Mucous membranes are moist. Posterior pharynx clear of any exudate or lesions.   Neck: normal, supple, no masses, no thyromegaly Respiratory: Coarse rales bilaterally, no wheezing, no crackles. Normal respiratory effort.   Cardiovascular: S1 & S2 heard, regular rate and rhythm. No significant JVD. Abdomen: No distension, no tenderness, no masses palpated. Bowel sounds normal.  Musculoskeletal: no clubbing / cyanosis. No joint deformity upper and lower extremities.   Skin: no significant rashes, lesions, ulcers. Warm, dry, well-perfused. Neurologic: CN 2-12 grossly intact. Sensation intact. Strength 5/5 in all 4 limbs.  Psychiatric: Alert and oriented to person only. Pleasant.     Labs on Admission: I have personally reviewed following labs and imaging studies  CBC: Recent Labs  Lab 06/13/17 2230  WBC 4.4  NEUTROABS 3.3  HGB 9.7*  HCT 28.7*  MCV 102.5*  PLT 979*   Basic Metabolic  Panel: Recent Labs  Lab 06/13/17 2230  NA 139  K 3.5  CL 105  CO2 24  GLUCOSE 115*  BUN 20  CREATININE 0.81  CALCIUM 8.6*   GFR: Estimated Creatinine Clearance: 35.1 mL/min (by C-G formula based on SCr of 0.81 mg/dL). Liver Function Tests: Recent Labs  Lab 06/13/17 2230  AST 27  ALT 18  ALKPHOS 70  BILITOT 0.6  PROT 4.9*  ALBUMIN 2.8*   Recent Labs  Lab 06/13/17 2221  LIPASE 29   No results for input(s): AMMONIA in the last 168 hours. Coagulation Profile: No results for input(s): INR, PROTIME in the last 168 hours. Cardiac Enzymes: No results for input(s): CKTOTAL, CKMB, CKMBINDEX, TROPONINI in the last 168 hours. BNP (last 3 results) No results for input(s): PROBNP in the last 8760 hours. HbA1C: No results for input(s): HGBA1C in the last 72 hours. CBG: No results for input(s): GLUCAP in the last 168 hours. Lipid Profile: No results for input(s): CHOL, HDL, LDLCALC, TRIG, CHOLHDL, LDLDIRECT in the last 72 hours. Thyroid Function Tests: No results for input(s): TSH, T4TOTAL, FREET4, T3FREE, THYROIDAB in the last 72 hours. Anemia Panel: No results for input(s): VITAMINB12, FOLATE, FERRITIN, TIBC, IRON, RETICCTPCT in the last 72 hours. Urine analysis:    Component Value Date/Time   COLORURINE YELLOW 06/13/2017 2356   APPEARANCEUR CLEAR 06/13/2017 2356   LABSPEC 1.025 06/13/2017 2356   PHURINE 5.0 06/13/2017 2356   GLUCOSEU NEGATIVE 06/13/2017 2356   HGBUR NEGATIVE 06/13/2017 2356   BILIRUBINUR NEGATIVE 06/13/2017 2356   KETONESUR NEGATIVE 06/13/2017 2356   PROTEINUR NEGATIVE 06/13/2017 2356   UROBILINOGEN 0.2 06/28/2014 0456   NITRITE NEGATIVE 06/13/2017 2356   LEUKOCYTESUR NEGATIVE 06/13/2017 2356   Sepsis Labs: @LABRCNTIP (procalcitonin:4,lacticidven:4) )No results found for this or any previous visit (from the past 240 hour(s)).   Radiological Exams on Admission: Dg Chest 1 View  Result Date: 06/13/2017 CLINICAL DATA:  Fall from recliner.  Pain.  EXAM: CHEST 1 VIEW COMPARISON:  05/07/2017, additional priors. FINDINGS: Lower lung volumes from prior. Unchanged heart size allowing for differences in technique. Retrocardiac opacity consistent with hiatal hernia, limiting left lung base. Bronchovascular crowding with mild bronchial cuffing. No confluent airspace disease. No pneumothorax. No large pleural effusion, limited assessment due to  technique. No grossly displaced rib fracture. IMPRESSION: 1. Bronchovascular crowding with peribronchial cuffing, may be due to lower lung volumes or bronchial inflammation. 2. Large hiatal hernia. Electronically Signed   By: Jeb Levering M.D.   On: 06/13/2017 23:50   Ct Head Wo Contrast  Result Date: 06/13/2017 CLINICAL DATA:  Patient fell from recliner.  Dementia baseline. EXAM: CT HEAD WITHOUT CONTRAST CT CERVICAL SPINE WITHOUT CONTRAST TECHNIQUE: Multidetector CT imaging of the head and cervical spine was performed following the standard protocol without intravenous contrast. Multiplanar CT image reconstructions of the cervical spine were also generated. COMPARISON:  05/06/2017 FINDINGS: CT HEAD FINDINGS Brain: Redemonstration of cerebral atrophy with sulcal and ventricular prominence. Moderate chronic periventricular and subcortical white matter microvascular ischemic change is noted without large vascular territory infarct, intra-axial mass nor extra-axial fluid collections. No intracranial hemorrhage. Vascular: No hyperdense vessel or unexpected calcification. Mild-to-moderate atherosclerosis of the carotid siphons. Skull: No acute nor suspicious osseous abnormality. Sinuses/Orbits: No acute finding. Other: None CT CERVICAL SPINE FINDINGS Alignment: Maintained cervical lordosis. Intact craniocervical relationship. Osteoarthritic joint space narrowing spurring about the atlantodental. Skull base and vertebrae: No acute fracture. No primary bone lesion or focal pathologic process. Soft tissues and spinal canal: No  prevertebral fluid or swelling. No visible canal hematoma. Disc levels: Moderate to marked disc space narrowing along the entirety of the cervical spine with small posterior marginal osteophytes and uncinate spurring secondary to uncovertebral joint osteoarthritis. These contribute to multilevel bilateral neural foraminal encroachment. No jumped or perched facets. Mild grade 1 anterolisthesis of C6 on C7. Upper chest: No pneumonic consolidations. Limited assessment due to patient respiratory motion. Other: Moderate atherosclerosis of the extracranial carotid arteries bilaterally. IMPRESSION: 1. No acute intracranial abnormality. 2. Atrophy with chronic microvascular ischemic disease. 3. Cervical spondylosis without acute posttraumatic cervical spine fracture. Electronically Signed   By: Ashley Royalty M.D.   On: 06/13/2017 23:16   Ct Cervical Spine Wo Contrast  Result Date: 06/13/2017 CLINICAL DATA:  Patient fell from recliner.  Dementia baseline. EXAM: CT HEAD WITHOUT CONTRAST CT CERVICAL SPINE WITHOUT CONTRAST TECHNIQUE: Multidetector CT imaging of the head and cervical spine was performed following the standard protocol without intravenous contrast. Multiplanar CT image reconstructions of the cervical spine were also generated. COMPARISON:  05/06/2017 FINDINGS: CT HEAD FINDINGS Brain: Redemonstration of cerebral atrophy with sulcal and ventricular prominence. Moderate chronic periventricular and subcortical white matter microvascular ischemic change is noted without large vascular territory infarct, intra-axial mass nor extra-axial fluid collections. No intracranial hemorrhage. Vascular: No hyperdense vessel or unexpected calcification. Mild-to-moderate atherosclerosis of the carotid siphons. Skull: No acute nor suspicious osseous abnormality. Sinuses/Orbits: No acute finding. Other: None CT CERVICAL SPINE FINDINGS Alignment: Maintained cervical lordosis. Intact craniocervical relationship. Osteoarthritic joint  space narrowing spurring about the atlantodental. Skull base and vertebrae: No acute fracture. No primary bone lesion or focal pathologic process. Soft tissues and spinal canal: No prevertebral fluid or swelling. No visible canal hematoma. Disc levels: Moderate to marked disc space narrowing along the entirety of the cervical spine with small posterior marginal osteophytes and uncinate spurring secondary to uncovertebral joint osteoarthritis. These contribute to multilevel bilateral neural foraminal encroachment. No jumped or perched facets. Mild grade 1 anterolisthesis of C6 on C7. Upper chest: No pneumonic consolidations. Limited assessment due to patient respiratory motion. Other: Moderate atherosclerosis of the extracranial carotid arteries bilaterally. IMPRESSION: 1. No acute intracranial abnormality. 2. Atrophy with chronic microvascular ischemic disease. 3. Cervical spondylosis without acute posttraumatic cervical spine fracture. Electronically Signed  By: Ashley Royalty M.D.   On: 06/13/2017 23:16   Dg Knee Complete 4 Views Left  Result Date: 06/13/2017 CLINICAL DATA:  Fall from recliner. EXAM: LEFT KNEE - COMPLETE 4+ VIEW COMPARISON:  None. FINDINGS: No acute fracture or dislocation. Advanced tricompartmental osteoarthritis with tricompartmental joint space narrowing and peripheral spurring, most prominent in the medial tibiofemoral compartment. Mild chondrocalcinosis. There is a small joint effusion. The bones are under mineralized. IMPRESSION: 1. No fracture or dislocation of the left knee. 2. Advanced tricompartmental osteoarthritis with chondrocalcinosis. 3. Joint effusion. Electronically Signed   By: Jeb Levering M.D.   On: 06/13/2017 23:44   Dg Knee Complete 4 Views Right  Result Date: 06/13/2017 CLINICAL DATA:  Fall from recliner. EXAM: RIGHT KNEE - COMPLETE 4+ VIEW COMPARISON:  None. FINDINGS: Right knee total arthroplasty in expected alignment. No periprosthetic fracture or lucency. There  is been patellar resurfacing. No joint effusion. IMPRESSION: No acute fracture.  Right knee arthroplasty without complication. Electronically Signed   By: Jeb Levering M.D.   On: 06/13/2017 23:45   Dg Hips Bilat With Pelvis Min 5 Views  Result Date: 06/13/2017 CLINICAL DATA:  Fall from recliner.  Hip pain. EXAM: DG HIP (WITH OR WITHOUT PELVIS) 5+V BILAT COMPARISON:  None. FINDINGS: The cortical margins of the bony pelvis are intact. No fracture. Pubic symphysis and sacroiliac joints are congruent. Both femoral heads are well-seated in the respective acetabula. Osteoarthritis of both hips, right greater than left. Bones are under mineralized. IMPRESSION: No fracture of the pelvis or hips. Electronically Signed   By: Jeb Levering M.D.   On: 06/13/2017 23:47    EKG: Independently reviewed. Sinus rhythm, PVC's.   Assessment/Plan  1. Sepsis  - Presents with gen weakness and is found to have fever and hypoxia with mild cough reported   - Lactic acid is reassuring and she has not been hypotensive - Blood cultures collected in ED and empiric broad spectrum abx started  - UA unremarkable, CXR with question of inflammatory changes in bases  - Given the respiratory signs and symptoms, with normal UA and benign abdominal exam, likely pneumonia and possibly viral  - Plan to continue empiric abx for now while waiting for respiratory virus panel to result    2. Macrocytic anemia - Hgb is 9.7 on admission with MCV 102.8  - Hgb was 12.8 on 05/06/17  - No melena or hematochezia reported; FOBT is negative  - Check anemia panel, continue B-12 supplementation    3. Hypothyroidism  - Continue Synthroid   4. Dementia with behavior disturbance, depression, anxiety, insomnia  - Stable  - Continue Depakote, Zyprexa, trazodone, and prn Ativan   5. Gen weakness with recurrent falls  - Likely worsened in setting of sepsis  - Anticipate some improvement as the infection resolves   DVT prophylaxis:  SCD's Code Status: Full  Family Communication: Daughter updated at bedside Disposition Plan: Admit to med-surg Consults called: None Admission status: Inpatient    Vianne Bulls, MD [facility] Pager [phone]  If 7PM-7AM, please contact night-coverage [email or web address] Password [facility]  06/14/2017, 3:39 AM

## 2017-06-15 ENCOUNTER — Inpatient Hospital Stay (HOSPITAL_COMMUNITY): Payer: Medicare Other

## 2017-06-15 DIAGNOSIS — D469 Myelodysplastic syndrome, unspecified: Secondary | ICD-10-CM

## 2017-06-15 DIAGNOSIS — J9601 Acute respiratory failure with hypoxia: Secondary | ICD-10-CM

## 2017-06-15 DIAGNOSIS — D649 Anemia, unspecified: Secondary | ICD-10-CM

## 2017-06-15 DIAGNOSIS — W19XXXA Unspecified fall, initial encounter: Secondary | ICD-10-CM

## 2017-06-15 DIAGNOSIS — R509 Fever, unspecified: Secondary | ICD-10-CM

## 2017-06-15 DIAGNOSIS — R4182 Altered mental status, unspecified: Secondary | ICD-10-CM

## 2017-06-15 LAB — CBC WITH DIFFERENTIAL/PLATELET
Basophils Absolute: 0 10*3/uL (ref 0.0–0.1)
Basophils Relative: 0 %
Eosinophils Absolute: 0.2 10*3/uL (ref 0.0–0.7)
Eosinophils Relative: 3 %
HCT: 28.5 % — ABNORMAL LOW (ref 36.0–46.0)
Hemoglobin: 9.4 g/dL — ABNORMAL LOW (ref 12.0–15.0)
Lymphocytes Relative: 7 %
Lymphs Abs: 0.3 10*3/uL — ABNORMAL LOW (ref 0.7–4.0)
MCH: 33.3 pg (ref 26.0–34.0)
MCHC: 33 g/dL (ref 30.0–36.0)
MCV: 101.1 fL — ABNORMAL HIGH (ref 78.0–100.0)
Monocytes Absolute: 0.8 10*3/uL (ref 0.1–1.0)
Monocytes Relative: 18 %
Neutro Abs: 3.2 10*3/uL (ref 1.7–7.7)
Neutrophils Relative %: 72 %
Platelets: 139 10*3/uL — ABNORMAL LOW (ref 150–400)
RBC: 2.82 MIL/uL — ABNORMAL LOW (ref 3.87–5.11)
RDW: 15.8 % — ABNORMAL HIGH (ref 11.5–15.5)
WBC: 4.5 10*3/uL (ref 4.0–10.5)

## 2017-06-15 LAB — RENAL FUNCTION PANEL
Albumin: 2.4 g/dL — ABNORMAL LOW (ref 3.5–5.0)
Anion gap: 10 (ref 5–15)
BUN: 14 mg/dL (ref 6–20)
CO2: 24 mmol/L (ref 22–32)
Calcium: 8.1 mg/dL — ABNORMAL LOW (ref 8.9–10.3)
Chloride: 103 mmol/L (ref 101–111)
Creatinine, Ser: 0.72 mg/dL (ref 0.44–1.00)
GFR calc Af Amer: >60 mL/min (ref >60)
GFR calc non Af Amer: >60 mL/min (ref >60)
Glucose, Bld: 75 mg/dL (ref 65–99)
Phosphorus: 3.7 mg/dL (ref 2.5–4.6)
Potassium: 3.3 mmol/L — ABNORMAL LOW (ref 3.5–5.1)
Sodium: 137 mmol/L (ref 135–145)

## 2017-06-15 MED ORDER — POTASSIUM CHLORIDE CRYS ER 20 MEQ PO TBCR
40.0000 meq | EXTENDED_RELEASE_TABLET | ORAL | Status: DC
  Administered 2017-06-15: 40 meq via ORAL
  Filled 2017-06-15 (×3): qty 2

## 2017-06-15 MED ORDER — FUROSEMIDE 10 MG/ML IJ SOLN
40.0000 mg | Freq: Two times a day (BID) | INTRAMUSCULAR | Status: AC
  Administered 2017-06-15: 40 mg via INTRAVENOUS
  Filled 2017-06-15: qty 4

## 2017-06-15 MED ORDER — POTASSIUM CHLORIDE 20 MEQ PO PACK
40.0000 meq | PACK | Freq: Once | ORAL | Status: AC
  Administered 2017-06-15: 40 meq via ORAL
  Filled 2017-06-15: qty 2

## 2017-06-15 NOTE — Progress Notes (Signed)
Patient's daughter and son state that they would like to follow their mother's wishes for a DNR status. Copy of  Living Will was brought in and placed in chart by family.

## 2017-06-15 NOTE — Clinical Social Work Note (Addendum)
Clinical Social Work Assessment  Patient Details  Name: Cristina Middleton MRN: [id number] Date of Birth: 05/10/1920  Date of referral:  06/15/17               Reason for consult:  Facility Placement                Permission sought to share information with:  Case Manager Permission granted to share information::  Yes, Verbal Permission Granted  Name::        Agency::  [facility]  Relationship::     Contact Information:     Housing/Transportation Living arrangements for the past 2 months:  [place] of Information:  Patient, Facility Patient Interpreter Needed:  None Criminal Activity/Legal Involvement Pertinent to Current Situation/Hospitalization:  No - Comment as needed Significant Relationships:  Adult Children Lives with:  Facility Resident Do you feel safe going back to the place where you live?  Yes Need for family participation in patient care:  Yes (Comment)  Care giving concerns:  CSW met with patient daughter at bedside. Pt was resting and only oriented to person. Pt new resident at [facility] on [address], [place]. Daughter confirmed that patient ambulated with cane and typically walked around the facility freely. Pt more than likely will not have PT ordered here at hospital, but CSW will f/u.  Daughter in agreement and desires mom to return to ALF for care. CSW will continue to follow and provide support.  CSW then contacted [facility] and confirmed that patient is a resident at their facility. Amy advised that patient has been there a week and ambulates with cane. Amy confirmed that they had ordered PT for patient due to gait concerns as she switches arms with cane. They have in-house physical therapist that would see patient as warranted. Angie is the RN that CSW could f/u as needed. CSW provided contact information as well.   Social Worker assessment / plan:  CSW will f/u for disposition.  Employment status:   Retired Forensic scientist:  Medicare PT Recommendations:  [place] / Referral to community resources:  [place]  Patient/Family's Response to care:  Daughter appreciative of CSW assistance with disposition planning. Pt will return to [facility] at discharge. No issues or concerns.  Patient/Family's Understanding of and Emotional Response to Diagnosis, Current Treatment, and Prognosis:  Pt resides at [facility] prior to hospitalization. The plan is for patient to return per daughter and ALF agreeable. Pt ambulated with cane prior and can receive physical therapy at ALF if recommended. ALF was already planning to have patient evaluated by therapy due to gait issues. No issues or concerns at this time. CSW will continue to follow for disposition.  Emotional Assessment Appearance:  Appears stated age Attitude/Demeanor/Rapport:  Unable to Assess(Asleep) Affect (typically observed):  Other(Asleep) Orientation:  Oriented to Self Alcohol / Substance use:  Not Applicable Psych involvement (Current and /or in the community):  No (Comment)  Discharge Needs  Concerns to be addressed:  Discharge Planning Concerns Readmission within the last 30 days:  No Current discharge risk:  Dependent with Mobility, Physical Impairment Barriers to Discharge:  No Barriers Identified   Normajean Baxter, LCSW 06/15/2017, 10:32 AM

## 2017-06-15 NOTE — Progress Notes (Signed)
PROGRESS NOTE    Cristina Middleton [facility]  PFX:[id number] DOB: 07/09/1920 DOA: 06/13/2017 PCP: Jani Gravel, MD  Outpatient Specialists:     Brief Narrative:  Cristina Middleton is a 84 y.o. Caucasian female with past medical history significant for dementia, depression, anxiety, and OSA on CPAP, hypertension, hyperlipidemia, documented diabetes mellitus without complication.  Patient resides at the memory unit of of the facility.  However, there is no documented dementia.  History from the patient's daughter earlier today suggests that the patient likely has a dementing illness.  Patient is unsteady on her feet and prone to falls at baseline.  Patient presented to the emergency room with generalized weakness, recurrent falls, worsening lethargy for couple of days prior to presentation.  Patient was said to have slipped out of her recliner on the day of admission, and was also found down on the floor of her room after an unwitnessed fall the day before.  Patient reported aches and pains after the fall, and was to have developed a mild cough.  Temperature of 38.2 C was then documented in the ED on the day of admission, with O2 sat of 87% on room air.  Chest x-ray on admission was negative for infiltrates.  CT scan of the head and cervical spine were both unremarkable.  Elevated MCV, low hemoglobin, and low platelets noted.  WBC is low normal.  Vitamin B12 and folate levels are within normal range.  I suspect patient may have myelodysplastic syndrome as well, with associated marrow suppression.  Albumin is 2.8.  Urinalysis is non-revealing.  Lactic acid is within normal range.  Pro-calcitonin is 0.10.  Patient seen mildly volume depleted on presentation.  No obvious source of infection.  06/15/2017: Patient seen alongside patient's daughter and patient's nurse.  Patient remains lethargic and sleepy.  Will review and adjust patient's medications.  Chest x-ray revealed pulmonary edema/congestive heart failure.  We will  pursue echo, and also check cardiac BNP.  Will cautiously diurese the patient.  We will continue to monitor patient's renal function and electrolytes.  Potassium is 3.3 today.  Will go ahead and replete patient's potassium.  I discussed goal of care, end-of-life issues, and possibility of consulting palliative care team with the patient's daughter.  I also communicated to the patient's daughter that the patient's condition was guarded.  Patient's daughter has promised to produce a copy of [employer] from fiiied by the patient earlier.  My suspicion is that the patient had elected to be DO NOT RESUSCITATE.  We will await the production of the document.  As documented above, patient's prognosis is guarded.  We will continue to manage the patient expectantly.     Assessment & Plan:   Principal Problem: SIRS (systemic inflammatory response syndrome) (HCC) Encephalopathy, etiology unclear.  This could be possibl metabolic. Pulmonary edema/congestive heart failure, type unknown. Failure to thrive. Hypoalbuminemia. Hypokalemia Likely undiagnosed myelodysplastic syndrome. Likely undiagnosed dementia with behavioral problems. Active Problems:   HTN (hypertension)   Diabetes mellitus, type 2 (HCC)   Sleep apnea   Hypothyroidism   Acute respiratory failure with hypoxia (HCC)   Macrocytic anemia   Multiple falls   SIRS: -On presentation, there was documented fever. -However, pro-calcitonin is within normal limits. -No obvious source of infection today. -Patient is currently on broad spectrum antibiotics empirically. -No further documented fever. -No leukocytosis, however, the patient has some form of myelosuppression. -We will continue to follow culture results. -We will complete workup.  Encephalopathy, likely metabolic/lethargic: -Etiology is likely  multifactorial. -Patient likely has a dementing illness that may have altered sense of day and night. -Patient's daughter tells me that the  patient has nights that she would stay awake and difficult to manage. -Patient is also on medications that can alter the mind. -Will review and adjust patient's medication.  Pulmonary edema/congestive heart failure of unknown type: -There is no prior documentation of congestive heart failure. -We will check cardiac BNP and echocardiogram. -Goal of care will direct further  workup. -Patient also has low albumin.  Pulmonary edema noted on the chest x-ray could be some form of capillary leak/decreased oncotic pressure. -Will start patient on IV Lasix cautiously. -We will continue to monitor renal function and electrolytes. -Low albumin in this patient may indicate guarded prognosis. -We will continue to discuss goals of care, CODE STATUS, and option of consulting palliative care team. -Guarded prognosis.  Failure to thrive: -Patient has been having recurrent falls at the facility. -Patient is noted to be weak. -We will manage expectantly. -Albumin on presentation was 2.8.  This decreased to 2.4 earlier today. -Guarded prognosis.  Fever: -This was documented on admission in the ER.  No further fever has been documented.  However, patient is on broad-spectrum antibiotics. -Continue to monitor.   -Follow culture results. -Low threshold to pursue CT of the chest without contrast if the abnormal finding noted on today's chest x-ray does not resolve completely with diuresis.  For now, the abnormal finding is deemed to be secondary to pulmonary edema.  Hypokalemia: -Continue to monitor and replete. -Check magnesium level.  Likely undiagnosed myelodysplastic syndrome: -MCV is elevated, patient has anemia and low platelet count. -Continue to monitor for now. -Problem management will depend on goal of care.  Likely dementia with behavioral problems: -Patient's daughter reports prior behavior problems. -Patient currently resides at the memory unit. -Patient is on oral olanzapine, and other  mind altering medications. -Will adjust patient's medications accordingly.  Multiple falls: -This may be related to the patient's weakness and failure to thrive. -We will consult physical therapy team when patient improves clinically. -At the moment, the patient will be too weak to participate with physical therapy.  Acute respiratory failure, with hypoxia: -Hypoxia was noted on admission. -O2 sat done today on room air was 87%. -We will continue supplemental oxygen. -We will treat pulmonary edema/congestive heart failure with diuresis. -Further management will depend on hospital course.  Hypertension: -We will continue to optimize.  Documented diabetes mellitus: -Patient is not on any medication for diabetes mellitus at the moment. -I guess that this might be diet controlled diabetes mellitus. -Will start Accu-Cheks. -Further management will depend on hospital course.  Overall, the patient is very ill.  Patient's long-term prognosis is guarded.  DNR is quite appropriate, but will await production of the documents from the patient's family.  End of life issues already discussed.  Will consult palliative care team once the family agrees.  For now, the patient's daughter will want to discuss with her brother.     DVT prophylaxis: SCD. Code Status: Unclear at the moment.  Family Communication: Daughter.  Disposition Plan: This will depend on hospital course.    Consultants:   None  Procedures:   None  Antimicrobials:   Aztreonam  Levaquin  Vancomycin  (Will screen for MRSA.  We will discontinue vancomycin if this comes back negative)   Subjective: Patient is lethargic. Patient cannot give any history.  Objective: Vitals:   06/14/17 1407 06/14/17 1947 06/15/17 0519 06/15/17 1500  BP: Marland Kitchen)  113/55 138/75 136/72 (!) 100/47  Pulse: 80 82 80 81  Resp: 16 16 16 16   Temp: (!) 97.5 F (36.4 C) 98 F (36.7 C) 97.9 F (36.6 C) (!) 96.8 F (36 C)  TempSrc: Oral Oral  Oral Oral  SpO2: 96% 95% 95% 92%  Weight:      Height:        Intake/Output Summary (Last 24 hours) at 06/15/2017 1832 Last data filed at 06/15/2017 1700 Gross per 24 hour  Intake 170 ml  Output 2 ml  Net 168 ml   Filed Weights   06/14/17 0000  Weight: 124 lb (56.2 kg)    Examination:  General exam: Lethargic.  Sleepy.  Comfortable at the moment.   Respiratory system: Decreased air entry.  Transmitted sounds.   Cardiovascular system: S1 & S2. No pedal edema. Gastrointestinal system: Abdomen is nondistended, soft and nontender. No organomegaly or masses felt. Normal bowel sounds heard. Central nervous system: Alert and oriented. No focal neurological deficits. Extremities: No leg edema.   Data Reviewed: I have personally reviewed following labs and imaging studies  CBC: Recent Labs  Lab 06/13/17 2230 06/14/17 0447 06/15/17 0810  WBC 4.4 5.0 4.5  NEUTROABS 3.3 3.5 3.2  HGB 9.7* 9.1* 9.4*  HCT 28.7* 27.9* 28.5*  MCV 102.5* 102.2* 101.1*  PLT 140* 144* 161*   Basic Metabolic Panel: Recent Labs  Lab 06/13/17 2230 06/14/17 0447 06/15/17 0810  NA 139 139 137  K 3.5 3.5 3.3*  CL 105 104 103  CO2 24 23 24   GLUCOSE 115* 80 75  BUN 20 18 14   CREATININE 0.81 0.68 0.72  CALCIUM 8.6* 8.2* 8.1*  PHOS  --   --  3.7   GFR: Estimated Creatinine Clearance: 35.6 mL/min (by C-G formula based on SCr of 0.72 mg/dL). Liver Function Tests: Recent Labs  Lab 06/13/17 2230 06/15/17 0810  AST 27  --   ALT 18  --   ALKPHOS 70  --   BILITOT 0.6  --   PROT 4.9*  --   ALBUMIN 2.8* 2.4*   Recent Labs  Lab 06/13/17 2221  LIPASE 29   No results for input(s): AMMONIA in the last 168 hours. Coagulation Profile: No results for input(s): INR, PROTIME in the last 168 hours. Cardiac Enzymes: No results for input(s): CKTOTAL, CKMB, CKMBINDEX, TROPONINI in the last 168 hours. BNP (last 3 results) No results for input(s): PROBNP in the last 8760 hours. HbA1C: No results for  input(s): HGBA1C in the last 72 hours. CBG: No results for input(s): GLUCAP in the last 168 hours. Lipid Profile: No results for input(s): CHOL, HDL, LDLCALC, TRIG, CHOLHDL, LDLDIRECT in the last 72 hours. Thyroid Function Tests: No results for input(s): TSH, T4TOTAL, FREET4, T3FREE, THYROIDAB in the last 72 hours. Anemia Panel: Recent Labs    06/14/17 0447  VITAMINB12 1,331*  FOLATE 23.1  FERRITIN 336*  TIBC 203*  IRON 41  RETICCTPCT 1.6   Urine analysis:    Component Value Date/Time   COLORURINE YELLOW 06/13/2017 2356   APPEARANCEUR CLEAR 06/13/2017 2356   LABSPEC 1.025 06/13/2017 2356   PHURINE 5.0 06/13/2017 2356   GLUCOSEU NEGATIVE 06/13/2017 2356   HGBUR NEGATIVE 06/13/2017 2356   BILIRUBINUR NEGATIVE 06/13/2017 2356   KETONESUR NEGATIVE 06/13/2017 2356   PROTEINUR NEGATIVE 06/13/2017 2356   UROBILINOGEN 0.2 06/28/2014 0456   NITRITE NEGATIVE 06/13/2017 2356   LEUKOCYTESUR NEGATIVE 06/13/2017 2356   Sepsis Labs: @LABRCNTIP (procalcitonin:4,lacticidven:4)  ) Recent Results (from the past 240  hour(s))  Blood culture (routine x 2)     Status: None (Preliminary result)   Collection Time: 06/13/17 11:50 PM  Result Value Ref Range Status   Specimen Description BLOOD RIGHT ARM  Final   Special Requests   Final    BOTTLES DRAWN AEROBIC AND ANAEROBIC Blood Culture adequate volume   Culture   Final    NO GROWTH 1 DAY Performed at [facility], [place] [address], [place], [place] [address]    Report Status PENDING  Incomplete  Respiratory Panel by PCR     Status: None   Collection Time: 06/14/17 12:09 AM  Result Value Ref Range Status   Adenovirus NOT DETECTED NOT DETECTED Final   Coronavirus 229E NOT DETECTED NOT DETECTED Final   Coronavirus HKU1 NOT DETECTED NOT DETECTED Final   Coronavirus NL63 NOT DETECTED NOT DETECTED Final   Coronavirus OC43 NOT DETECTED NOT DETECTED Final   Metapneumovirus NOT DETECTED NOT DETECTED Final   Rhinovirus / Enterovirus NOT  DETECTED NOT DETECTED Final   Influenza A NOT DETECTED NOT DETECTED Final   Influenza A H1 NOT DETECTED NOT DETECTED Final   Influenza A H1 2009 NOT DETECTED NOT DETECTED Final   Influenza A H3 NOT DETECTED NOT DETECTED Final   Influenza B NOT DETECTED NOT DETECTED Final   Parainfluenza Virus 1 NOT DETECTED NOT DETECTED Final   Parainfluenza Virus 2 NOT DETECTED NOT DETECTED Final   Parainfluenza Virus 3 NOT DETECTED NOT DETECTED Final   Parainfluenza Virus 4 NOT DETECTED NOT DETECTED Final   Respiratory Syncytial Virus NOT DETECTED NOT DETECTED Final   Bordetella pertussis NOT DETECTED NOT DETECTED Final   Chlamydophila pneumoniae NOT DETECTED NOT DETECTED Final   Mycoplasma pneumoniae NOT DETECTED NOT DETECTED Final    Comment: Performed at [facility], [place]. [address], [place], [place] [address]  Blood culture (routine x 2)     Status: None (Preliminary result)   Collection Time: 06/14/17 12:18 AM  Result Value Ref Range Status   Specimen Description BLOOD LEFT ANTECUBITAL  Final   Special Requests   Final    BOTTLES DRAWN AEROBIC AND ANAEROBIC Blood Culture adequate volume   Culture   Final    NO GROWTH 1 DAY Performed at [facility], [place] [address], [place], [place] [address]    Report Status PENDING  Incomplete         Radiology Studies: Dg Chest 1 View  Result Date: 06/13/2017 CLINICAL DATA:  Fall from recliner.  Pain. EXAM: CHEST 1 VIEW COMPARISON:  05/07/2017, additional priors. FINDINGS: Lower lung volumes from prior. Unchanged heart size allowing for differences in technique. Retrocardiac opacity consistent with hiatal hernia, limiting left lung base. Bronchovascular crowding with mild bronchial cuffing. No confluent airspace disease. No pneumothorax. No large pleural effusion, limited assessment due to technique. No grossly displaced rib fracture. IMPRESSION: 1. Bronchovascular crowding with peribronchial cuffing, may be due to lower lung volumes or  bronchial inflammation. 2. Large hiatal hernia. Electronically Signed   By: Jeb Levering M.D.   On: 06/13/2017 23:50   Ct Head Wo Contrast  Result Date: 06/13/2017 CLINICAL DATA:  Patient fell from recliner.  Dementia baseline. EXAM: CT HEAD WITHOUT CONTRAST CT CERVICAL SPINE WITHOUT CONTRAST TECHNIQUE: Multidetector CT imaging of the head and cervical spine was performed following the standard protocol without intravenous contrast. Multiplanar CT image reconstructions of the cervical spine were also generated. COMPARISON:  05/06/2017 FINDINGS: CT HEAD FINDINGS Brain: Redemonstration of  cerebral atrophy with sulcal and ventricular prominence. Moderate chronic periventricular and subcortical white matter microvascular ischemic change is noted without large vascular territory infarct, intra-axial mass nor extra-axial fluid collections. No intracranial hemorrhage. Vascular: No hyperdense vessel or unexpected calcification. Mild-to-moderate atherosclerosis of the carotid siphons. Skull: No acute nor suspicious osseous abnormality. Sinuses/Orbits: No acute finding. Other: None CT CERVICAL SPINE FINDINGS Alignment: Maintained cervical lordosis. Intact craniocervical relationship. Osteoarthritic joint space narrowing spurring about the atlantodental. Skull base and vertebrae: No acute fracture. No primary bone lesion or focal pathologic process. Soft tissues and spinal canal: No prevertebral fluid or swelling. No visible canal hematoma. Disc levels: Moderate to marked disc space narrowing along the entirety of the cervical spine with small posterior marginal osteophytes and uncinate spurring secondary to uncovertebral joint osteoarthritis. These contribute to multilevel bilateral neural foraminal encroachment. No jumped or perched facets. Mild grade 1 anterolisthesis of C6 on C7. Upper chest: No pneumonic consolidations. Limited assessment due to patient respiratory motion. Other: Moderate atherosclerosis of the  extracranial carotid arteries bilaterally. IMPRESSION: 1. No acute intracranial abnormality. 2. Atrophy with chronic microvascular ischemic disease. 3. Cervical spondylosis without acute posttraumatic cervical spine fracture. Electronically Signed   By: Ashley Royalty M.D.   On: 06/13/2017 23:16   Ct Cervical Spine Wo Contrast  Result Date: 06/13/2017 CLINICAL DATA:  Patient fell from recliner.  Dementia baseline. EXAM: CT HEAD WITHOUT CONTRAST CT CERVICAL SPINE WITHOUT CONTRAST TECHNIQUE: Multidetector CT imaging of the head and cervical spine was performed following the standard protocol without intravenous contrast. Multiplanar CT image reconstructions of the cervical spine were also generated. COMPARISON:  05/06/2017 FINDINGS: CT HEAD FINDINGS Brain: Redemonstration of cerebral atrophy with sulcal and ventricular prominence. Moderate chronic periventricular and subcortical white matter microvascular ischemic change is noted without large vascular territory infarct, intra-axial mass nor extra-axial fluid collections. No intracranial hemorrhage. Vascular: No hyperdense vessel or unexpected calcification. Mild-to-moderate atherosclerosis of the carotid siphons. Skull: No acute nor suspicious osseous abnormality. Sinuses/Orbits: No acute finding. Other: None CT CERVICAL SPINE FINDINGS Alignment: Maintained cervical lordosis. Intact craniocervical relationship. Osteoarthritic joint space narrowing spurring about the atlantodental. Skull base and vertebrae: No acute fracture. No primary bone lesion or focal pathologic process. Soft tissues and spinal canal: No prevertebral fluid or swelling. No visible canal hematoma. Disc levels: Moderate to marked disc space narrowing along the entirety of the cervical spine with small posterior marginal osteophytes and uncinate spurring secondary to uncovertebral joint osteoarthritis. These contribute to multilevel bilateral neural foraminal encroachment. No jumped or perched  facets. Mild grade 1 anterolisthesis of C6 on C7. Upper chest: No pneumonic consolidations. Limited assessment due to patient respiratory motion. Other: Moderate atherosclerosis of the extracranial carotid arteries bilaterally. IMPRESSION: 1. No acute intracranial abnormality. 2. Atrophy with chronic microvascular ischemic disease. 3. Cervical spondylosis without acute posttraumatic cervical spine fracture. Electronically Signed   By: Ashley Royalty M.D.   On: 06/13/2017 23:16   Dg Chest Port 1 View  Result Date: 06/15/2017 CLINICAL DATA:  Altered mental status. History of diabetes and hypertension, former smoker. EXAM: PORTABLE CHEST 1 VIEW COMPARISON:  Portable chest x-ray of June 13, 2017 FINDINGS: The pulmonary interstitial markings are more conspicuous today. The pulmonary vascularity is engorged and the cardiac silhouette is enlarged. There is a smaller moderate size left pleural effusion. There calcification in the wall of the aortic arch. The bony thorax exhibits no acute abnormality. IMPRESSION: Acute cardiac decompensation with pulmonary vascular congestion and pulmonary edema. Small to moderate size left pleural effusion.  Thoracic aortic atherosclerosis. Electronically Signed   By: David  Martinique M.D.   On: 06/15/2017 11:21   Dg Knee Complete 4 Views Left  Result Date: 06/13/2017 CLINICAL DATA:  Fall from recliner. EXAM: LEFT KNEE - COMPLETE 4+ VIEW COMPARISON:  None. FINDINGS: No acute fracture or dislocation. Advanced tricompartmental osteoarthritis with tricompartmental joint space narrowing and peripheral spurring, most prominent in the medial tibiofemoral compartment. Mild chondrocalcinosis. There is a small joint effusion. The bones are under mineralized. IMPRESSION: 1. No fracture or dislocation of the left knee. 2. Advanced tricompartmental osteoarthritis with chondrocalcinosis. 3. Joint effusion. Electronically Signed   By: Jeb Levering M.D.   On: 06/13/2017 23:44   Dg Knee Complete  4 Views Right  Result Date: 06/13/2017 CLINICAL DATA:  Fall from recliner. EXAM: RIGHT KNEE - COMPLETE 4+ VIEW COMPARISON:  None. FINDINGS: Right knee total arthroplasty in expected alignment. No periprosthetic fracture or lucency. There is been patellar resurfacing. No joint effusion. IMPRESSION: No acute fracture.  Right knee arthroplasty without complication. Electronically Signed   By: Jeb Levering M.D.   On: 06/13/2017 23:45   Dg Hips Bilat With Pelvis Min 5 Views  Result Date: 06/13/2017 CLINICAL DATA:  Fall from recliner.  Hip pain. EXAM: DG HIP (WITH OR WITHOUT PELVIS) 5+V BILAT COMPARISON:  None. FINDINGS: The cortical margins of the bony pelvis are intact. No fracture. Pubic symphysis and sacroiliac joints are congruent. Both femoral heads are well-seated in the respective acetabula. Osteoarthritis of both hips, right greater than left. Bones are under mineralized. IMPRESSION: No fracture of the pelvis or hips. Electronically Signed   By: Jeb Levering M.D.   On: 06/13/2017 23:47        Scheduled Meds: . artificial tears   Both Eyes BID  . calcium carbonate  500 mg of elemental calcium Oral Q breakfast  . divalproex  125 mg Oral TID  . escitalopram  10 mg Oral Daily  . ketotifen  1 drop Both Eyes BID  . levothyroxine  100 mcg Oral QAC breakfast  . Melatonin  3 mg Oral QHS  . OLANZapine  5 mg Oral BID  . pantoprazole sodium  40 mg Per Tube Daily  . potassium chloride  40 mEq Oral Q4H  . simvastatin  20 mg Oral QHS  . sodium chloride flush  3 mL Intravenous Q12H  . traZODone  100 mg Oral QHS  . vitamin B-12  1,000 mcg Oral Daily   Continuous Infusions: . sodium chloride 250 mL (06/14/17 1556)  . aztreonam 1 g (06/15/17 1500)  . [START ON 06/16/2017] levofloxacin (LEVAQUIN) IV    . vancomycin 750 mg (06/15/17 0022)     LOS: 1 day    Time spent: 38 minutes.    Dana Allan, MD  [facility] Pager #: [phone] 7PM-7AM contact night coverage as  above

## 2017-06-16 ENCOUNTER — Inpatient Hospital Stay (HOSPITAL_COMMUNITY): Payer: Medicare Other

## 2017-06-16 DIAGNOSIS — I361 Nonrheumatic tricuspid (valve) insufficiency: Secondary | ICD-10-CM

## 2017-06-16 DIAGNOSIS — I509 Heart failure, unspecified: Secondary | ICD-10-CM

## 2017-06-16 LAB — BRAIN NATRIURETIC PEPTIDE: B Natriuretic Peptide: 142.7 pg/mL — ABNORMAL HIGH (ref 0.0–100.0)

## 2017-06-16 LAB — RENAL FUNCTION PANEL
Albumin: 2.3 g/dL — ABNORMAL LOW (ref 3.5–5.0)
Anion gap: 10 (ref 5–15)
BUN: 17 mg/dL (ref 6–20)
CO2: 24 mmol/L (ref 22–32)
Calcium: 8.3 mg/dL — ABNORMAL LOW (ref 8.9–10.3)
Chloride: 107 mmol/L (ref 101–111)
Creatinine, Ser: 0.83 mg/dL (ref 0.44–1.00)
GFR calc Af Amer: >60 mL/min (ref >60)
GFR calc non Af Amer: 59 mL/min — ABNORMAL LOW (ref >60)
Glucose, Bld: 88 mg/dL (ref 65–99)
Phosphorus: 3.3 mg/dL (ref 2.5–4.6)
Potassium: 4.6 mmol/L (ref 3.5–5.1)
Sodium: 141 mmol/L (ref 135–145)

## 2017-06-16 LAB — HEMOGLOBIN A1C
Hgb A1c MFr Bld: 5.3 % (ref 4.8–5.6)
Mean Plasma Glucose: 105.41 mg/dL

## 2017-06-16 LAB — GLUCOSE, CAPILLARY
GLUCOSE-CAPILLARY: 83 mg/dL (ref 65–99)
Glucose-Capillary: 81 mg/dL (ref 65–99)
Glucose-Capillary: 87 mg/dL (ref 65–99)
Glucose-Capillary: 84 mg/dL (ref 65–99)
Glucose-Capillary: 97 mg/dL (ref 65–99)

## 2017-06-16 LAB — MRSA PCR SCREENING: MRSA by PCR: POSITIVE — AB

## 2017-06-16 LAB — ECHOCARDIOGRAM COMPLETE
Height: 63 in
Weight: 1984 oz

## 2017-06-16 LAB — MAGNESIUM: Magnesium: 1.8 mg/dL (ref 1.7–2.4)

## 2017-06-16 MED ORDER — MUPIROCIN 2 % EX OINT
TOPICAL_OINTMENT | Freq: Two times a day (BID) | CUTANEOUS | Status: AC
  Administered 2017-06-16 – 2017-06-20 (×10): via NASAL
  Filled 2017-06-16 (×4): qty 22

## 2017-06-16 MED ORDER — ENSURE ENLIVE PO LIQD: 237.0000 mL | Freq: Two times a day (BID) | ORAL | Status: DC

## 2017-06-16 MED ORDER — DIVALPROEX SODIUM 125 MG PO CSDR
125.0000 mg | DELAYED_RELEASE_CAPSULE | Freq: Two times a day (BID) | ORAL | Status: DC
  Administered 2017-06-16 – 2017-06-22 (×12): 125 mg via ORAL
  Filled 2017-06-16 (×15): qty 1

## 2017-06-16 MED ORDER — INSULIN ASPART 100 UNIT/ML ~~LOC~~ SOLN: 0.0000 [IU] | Freq: Three times a day (TID) | SUBCUTANEOUS | Status: DC

## 2017-06-16 MED ORDER — POTASSIUM CHLORIDE CRYS ER 20 MEQ PO TBCR: 40.0000 meq | EXTENDED_RELEASE_TABLET | Freq: Once | ORAL | Status: DC

## 2017-06-16 MED ORDER — ENSURE ENLIVE PO LIQD
237.0000 mL | Freq: Every day | ORAL | Status: DC
  Administered 2017-06-16 – 2017-06-20 (×5): 237 mL via ORAL

## 2017-06-16 MED ORDER — POTASSIUM CHLORIDE 20 MEQ PO PACK
40.0000 meq | PACK | Freq: Once | ORAL | Status: AC
  Administered 2017-06-16: 40 meq via ORAL
  Filled 2017-06-16: qty 2

## 2017-06-16 MED ORDER — OLANZAPINE 5 MG PO TABS
2.5000 mg | ORAL_TABLET | Freq: Every day | ORAL | Status: DC
  Administered 2017-06-16 – 2017-06-18 (×3): 2.5 mg via ORAL
  Filled 2017-06-16 (×3): qty 1

## 2017-06-16 NOTE — Progress Notes (Signed)
PROGRESS NOTE    Cristina Middleton [facility]  NFA:[id number] DOB: 1920-09-22 DOA: 06/13/2017 PCP: Jani Gravel, MD  Outpatient Specialists:     Brief Narrative:  Cristina Middleton is a 84 y.o. Caucasian female with past medical history significant for dementia, depression, anxiety, and OSA on CPAP, hypertension, hyperlipidemia, documented diabetes mellitus without complication.  Patient resides at the memory unit of of the facility.  However, there is no documented dementia.  History from the patient's daughter earlier today suggests that the patient likely has a dementing illness.  Patient is unsteady on her feet and prone to falls at baseline.  Patient presented to the emergency room with generalized weakness, recurrent falls, worsening lethargy for couple of days prior to presentation.  Patient was said to have slipped out of her recliner on the day of admission, and was also found down on the floor of her room after an unwitnessed fall the day before.  Patient reported aches and pains after the fall, and was to have developed a mild cough.  Temperature of 38.2 C was then documented in the ED on the day of admission, with O2 sat of 87% on room air.  Chest x-ray on admission was negative for infiltrates.  CT scan of the head and cervical spine were both unremarkable.  Elevated MCV, low hemoglobin, and low platelets noted.  WBC is low normal.  Vitamin B12 and folate levels are within normal range.  I suspect patient may have myelodysplastic syndrome as well, with associated marrow suppression.  Albumin is 2.8.  Urinalysis is non-revealing.  Lactic acid is within normal range.  Pro-calcitonin is 0.10.  Patient seen mildly volume depleted on presentation.  No obvious source of infection.  06/15/2017: Patient seen alongside patient's daughter and patient's nurse.  Patient remains lethargic and sleepy.  Will review and adjust patient's medications.  Chest x-ray revealed pulmonary edema/congestive heart failure.  We will  pursue echo, and also check cardiac BNP.  Will cautiously diurese the patient.  We will continue to monitor patient's renal function and electrolytes.  Potassium is 3.3 today.  Will go ahead and replete patient's potassium.  I discussed goal of care, end-of-life issues, and possibility of consulting palliative care team with the patient's daughter.  I also communicated to the patient's daughter that the patient's condition was guarded.  Patient's daughter has promised to produce a copy of [place] [employer] from fiiied by the patient earlier.  My suspicion is that the patient had elected to be DO NOT RESUSCITATE.  We will await the production of the document.  As documented above, patient's prognosis is guarded.  We will continue to manage the patient expectantly.   06/16/2017: Patient seen alongside patient's daughter.  Patient remains stable.  Pulmonary edema is improving.  Echo result is negative.  EF is normal.  No diastolic dysfunction.  Albumin is 2.3 today.  Likely, the pulmonary edema is noncardiogenic.  Patient will also have capillary leak.  Chest x-ray done today reveals significant improvement with diuresis.  We will continue diuresis for now.  Patient remains lethargic.  No fever or chills.  Nasal swab for MRSA came back positive.  Discussed withwith 1 of our local pharmacies, will start patient on mupirocin.  Will also discontinue patient's antibiotics for now and just monitor patient.  There is no history from the patient.  Assessment & Plan:   Principal Problem: SIRS (systemic inflammatory response syndrome) (HCC) Encephalopathy, etiology unclear.  This could be possibl metabolic. Pulmonary edema/congestive heart failure, type  unknown. Failure to thrive. Hypoalbuminemia. Hypokalemia Likely undiagnosed myelodysplastic syndrome. Likely undiagnosed dementia with behavioral problems. Active Problems:   HTN (hypertension)   Diabetes mellitus, type 2 (HCC)   Sleep apnea   Hypothyroidism    Acute respiratory failure with hypoxia (HCC)   Macrocytic anemia   Multiple falls   SIRS: -On presentation, there was documented fever. -However, pro-calcitonin is within normal limits. -No obvious source of infection today. -Patient is currently on broad spectrum antibiotics empirically. -No further documented fever. -No leukocytosis, however, the patient has some form of myelosuppression. -We will continue to follow culture results. -We will complete workup.  Encephalopathy, likely metabolic/lethargic: -Etiology is likely multifactorial. -Patient likely has a dementing illness that may have altered sense of day and night. -Patient's daughter tells me that the patient has nights that she would stay awake and difficult to manage. -Patient is also on medications that can alter the mind. -Will review and adjust patient's medication.  Pulmonary edema/congestive heart failure of unknown type: -There is no prior documentation of congestive heart failure. -We will check cardiac BNP and echocardiogram. -Goal of care will direct further  workup. -Patient also has low albumin.  Pulmonary edema noted on the chest x-ray could be some form of capillary leak/decreased oncotic pressure. -Will start patient on IV Lasix cautiously. -We will continue to monitor renal function and electrolytes. -Low albumin in this patient may indicate guarded prognosis. -We will continue to discuss goals of care, CODE STATUS, and option of consulting palliative care team. -Guarded prognosis. -EF is normal.  No diastolic dysfunction. -Albumin is 2.3 today. -Pulmonary edema may be noncardiogenic.  Capillary leak is possible with low albumin.  Failure to thrive: -Patient has been having recurrent falls at the facility. -Patient is noted to be weak. -We will manage expectantly. -Albumin on presentation was 2.8.  This decreased to 2.4 earlier today. -Guarded prognosis.  Fever: -This was documented on admission  in the ER.  No further fever has been documented.  However, patient is on broad-spectrum antibiotics. -Continue to monitor.   -Follow culture results. -Low threshold to pursue CT of the chest without contrast if the abnormal finding noted on today's chest x-ray does not resolve completely with diuresis.  For now, the abnormal finding is deemed to be secondary to pulmonary edema.  Hypokalemia: -Continue to monitor and replete. -Check magnesium level.  Likely undiagnosed myelodysplastic syndrome: -MCV is elevated, patient has anemia and low platelet count. -Continue to monitor for now. -Problem management will depend on goal of care.  Likely dementia with behavioral problems: -Patient's daughter reports prior behavior problems. -Patient currently resides at the memory unit. -Patient is on oral olanzapine, and other mind altering medications. -Will adjust patient's medications accordingly.  Multiple falls: -This may be related to the patient's weakness and failure to thrive. -We will consult physical therapy team when patient improves clinically. -At the moment, the patient will be too weak to participate with physical therapy.  Acute respiratory failure, with hypoxia: -Hypoxia was noted on admission. -O2 sat done today on room air was 87%. -We will continue supplemental oxygen. -We will treat pulmonary edema/congestive heart failure with diuresis. -Further management will depend on hospital course.  Hypertension: -We will continue to optimize.  Documented diabetes mellitus: -Patient is not on any medication for diabetes mellitus at the moment. -I guess that this might be diet controlled diabetes mellitus. -Will start Accu-Cheks. -Further management will depend on hospital course.  Overall, the patient is very ill.  Patient's long-term  prognosis is guarded.  DNR is quite appropriate, but will await production of the documents from the patient's family.  End of life issues  already discussed.  Will consult palliative care team once the family agrees.  For now, the patient's daughter will want to discuss with her brother.     DVT prophylaxis: SCD. Code Status: Unclear at the moment.  Family Communication: Daughter.  Disposition Plan: This will depend on hospital course.    Consultants:   None  Procedures:   None  Antimicrobials:   Aztreonam  Levaquin  Vancomycin  (Will screen for MRSA.  We will discontinue vancomycin if this comes back negative)   Subjective: Patient is lethargic. Patient cannot give any history.  Objective: Vitals:   06/16/17 0300 06/16/17 0523 06/16/17 1300 06/16/17 1943  BP:  (!) 121/58 (!) 118/50 115/65  Pulse:  88 85 90  Resp: 20  19   Temp:  98.5 F (36.9 C) 98.6 F (37 C) 99.1 F (37.3 C)  TempSrc:  Oral Oral Axillary  SpO2:  95% 94% 94%  Weight:      Height:        Intake/Output Summary (Last 24 hours) at 06/16/2017 2006 Last data filed at 06/16/2017 1700 Gross per 24 hour  Intake 905.67 ml  Output 300 ml  Net 605.67 ml   Filed Weights   06/14/17 0000  Weight: 124 lb (56.2 kg)    Examination:  General exam: Lethargic.  Sleepy.  Comfortable at the moment.   Respiratory system: Decreased air entry.  Transmitted sounds.   Cardiovascular system: S1 & S2. No pedal edema. Gastrointestinal system: Abdomen is nondistended, soft and nontender. No organomegaly or masses felt. Normal bowel sounds heard. Central nervous system: Alert and oriented. No focal neurological deficits. Extremities: No leg edema.   Data Reviewed: I have personally reviewed following labs and imaging studies  CBC: Recent Labs  Lab 06/13/17 2230 06/14/17 0447 06/15/17 0810  WBC 4.4 5.0 4.5  NEUTROABS 3.3 3.5 3.2  HGB 9.7* 9.1* 9.4*  HCT 28.7* 27.9* 28.5*  MCV 102.5* 102.2* 101.1*  PLT 140* 144* 161*   Basic Metabolic Panel: Recent Labs  Lab 06/13/17 2230 06/14/17 0447 06/15/17 0810 06/16/17 0649  NA 139 139 137  141  K 3.5 3.5 3.3* 4.6  CL 105 104 103 107  CO2 24 23 24 24   GLUCOSE 115* 80 75 88  BUN 20 18 14 17   CREATININE 0.81 0.68 0.72 0.83  CALCIUM 8.6* 8.2* 8.1* 8.3*  MG  --   --   --  1.8  PHOS  --   --  3.7 3.3   GFR: Estimated Creatinine Clearance: 34.3 mL/min (by C-G formula based on SCr of 0.83 mg/dL). Liver Function Tests: Recent Labs  Lab 06/13/17 2230 06/15/17 0810 06/16/17 0649  AST 27  --   --   ALT 18  --   --   ALKPHOS 70  --   --   BILITOT 0.6  --   --   PROT 4.9*  --   --   ALBUMIN 2.8* 2.4* 2.3*   Recent Labs  Lab 06/13/17 2221  LIPASE 29   No results for input(s): AMMONIA in the last 168 hours. Coagulation Profile: No results for input(s): INR, PROTIME in the last 168 hours. Cardiac Enzymes: No results for input(s): CKTOTAL, CKMB, CKMBINDEX, TROPONINI in the last 168 hours. BNP (last 3 results) No results for input(s): PROBNP in the last 8760 hours. HbA1C: Recent Labs  06/16/17 0649  HGBA1C 5.3   CBG: Recent Labs  Lab 06/16/17 0526 06/16/17 0655 06/16/17 1128 06/16/17 1625  GLUCAP 81 87 83 84   Lipid Profile: No results for input(s): CHOL, HDL, LDLCALC, TRIG, CHOLHDL, LDLDIRECT in the last 72 hours. Thyroid Function Tests: No results for input(s): TSH, T4TOTAL, FREET4, T3FREE, THYROIDAB in the last 72 hours. Anemia Panel: Recent Labs    06/14/17 0447  VITAMINB12 1,331*  FOLATE 23.1  FERRITIN 336*  TIBC 203*  IRON 41  RETICCTPCT 1.6   Urine analysis:    Component Value Date/Time   COLORURINE YELLOW 06/13/2017 2356   APPEARANCEUR CLEAR 06/13/2017 2356   LABSPEC 1.025 06/13/2017 2356   PHURINE 5.0 06/13/2017 2356   GLUCOSEU NEGATIVE 06/13/2017 2356   HGBUR NEGATIVE 06/13/2017 2356   BILIRUBINUR NEGATIVE 06/13/2017 2356   KETONESUR NEGATIVE 06/13/2017 2356   PROTEINUR NEGATIVE 06/13/2017 2356   UROBILINOGEN 0.2 06/28/2014 0456   NITRITE NEGATIVE 06/13/2017 2356   LEUKOCYTESUR NEGATIVE 06/13/2017 2356   Sepsis  Labs: @LABRCNTIP (procalcitonin:4,lacticidven:4)  ) Recent Results (from the past 240 hour(s))  Blood culture (routine x 2)     Status: None (Preliminary result)   Collection Time: 06/13/17 11:50 PM  Result Value Ref Range Status   Specimen Description BLOOD RIGHT ARM  Final   Special Requests   Final    BOTTLES DRAWN AEROBIC AND ANAEROBIC Blood Culture adequate volume   Culture   Final    NO GROWTH 2 DAYS Performed at [facility], [place] [address] [place], [place] [address]    Report Status PENDING  Incomplete  Respiratory Panel by PCR     Status: None   Collection Time: 06/14/17 12:09 AM  Result Value Ref Range Status   Adenovirus NOT DETECTED NOT DETECTED Final   Coronavirus 229E NOT DETECTED NOT DETECTED Final   Coronavirus HKU1 NOT DETECTED NOT DETECTED Final   Coronavirus NL63 NOT DETECTED NOT DETECTED Final   Coronavirus OC43 NOT DETECTED NOT DETECTED Final   Metapneumovirus NOT DETECTED NOT DETECTED Final   Rhinovirus / Enterovirus NOT DETECTED NOT DETECTED Final   Influenza A NOT DETECTED NOT DETECTED Final   Influenza A H1 NOT DETECTED NOT DETECTED Final   Influenza A H1 2009 NOT DETECTED NOT DETECTED Final   Influenza A H3 NOT DETECTED NOT DETECTED Final   Influenza B NOT DETECTED NOT DETECTED Final   Parainfluenza Virus 1 NOT DETECTED NOT DETECTED Final   Parainfluenza Virus 2 NOT DETECTED NOT DETECTED Final   Parainfluenza Virus 3 NOT DETECTED NOT DETECTED Final   Parainfluenza Virus 4 NOT DETECTED NOT DETECTED Final   Respiratory Syncytial Virus NOT DETECTED NOT DETECTED Final   Bordetella pertussis NOT DETECTED NOT DETECTED Final   Chlamydophila pneumoniae NOT DETECTED NOT DETECTED Final   Mycoplasma pneumoniae NOT DETECTED NOT DETECTED Final    Comment: Performed at [facility], [place]. [address], [place], [place] [address]  Blood culture (routine x 2)     Status: None (Preliminary result)   Collection Time: 06/14/17 12:18 AM  Result Value Ref Range  Status   Specimen Description BLOOD LEFT ANTECUBITAL  Final   Special Requests   Final    BOTTLES DRAWN AEROBIC AND ANAEROBIC Blood Culture adequate volume   Culture   Final    NO GROWTH 2 DAYS Performed at [facility], [place] [address], [place], [place] [address]    Report Status PENDING  Incomplete  MRSA PCR Screening  Status: Abnormal   Collection Time: 06/16/17  6:43 AM  Result Value Ref Range Status   MRSA by PCR POSITIVE (A) NEGATIVE Final    Comment:        The GeneXpert MRSA Assay (FDA approved for NASAL specimens only), is one component of a comprehensive MRSA colonization surveillance program. It is not intended to diagnose MRSA infection nor to guide or monitor treatment for MRSA infections. RESULT CALLED TO, READ BACK BY AND VERIFIED WITH: Z. Calwitan RN 9:35 06/16/17 ([email or web address])          Radiology Studies: Dg Chest Port 1 View  Result Date: 06/16/2017 CLINICAL DATA:  CHF EXAM: PORTABLE CHEST 1 VIEW COMPARISON:  Yesterday FINDINGS: Cardiopericardial enlargement and hilar vascular enlargement. There is a large hiatal hernia partially containing gas. Presume layering pleural fluid. Generalized interstitial coarsening that is improved. IMPRESSION: 1. Questionable improvement in pulmonary edema since yesterday. 2. Layering pleural fluid greater on the left. 3. Large hiatal hernia. Electronically Signed   By: Monte Fantasia M.D.   On: 06/16/2017 07:25   Dg Chest Port 1 View  Result Date: 06/15/2017 CLINICAL DATA:  Altered mental status. History of diabetes and hypertension, former smoker. EXAM: PORTABLE CHEST 1 VIEW COMPARISON:  Portable chest x-ray of June 13, 2017 FINDINGS: The pulmonary interstitial markings are more conspicuous today. The pulmonary vascularity is engorged and the cardiac silhouette is enlarged. There is a smaller moderate size left pleural effusion. There calcification in the wall of the aortic arch. The bony thorax exhibits no acute  abnormality. IMPRESSION: Acute cardiac decompensation with pulmonary vascular congestion and pulmonary edema. Small to moderate size left pleural effusion. Thoracic aortic atherosclerosis. Electronically Signed   By: David  Martinique M.D.   On: 06/15/2017 11:21        Scheduled Meds: . artificial tears   Both Eyes BID  . calcium carbonate  500 mg of elemental calcium Oral Q breakfast  . divalproex  125 mg Oral Q12H  . escitalopram  10 mg Oral Daily  . feeding supplement (ENSURE ENLIVE)  237 mL Oral Q1500  . insulin aspart  0-9 Units Subcutaneous TID WC  . ketotifen  1 drop Both Eyes BID  . levothyroxine  100 mcg Oral QAC breakfast  . Melatonin  3 mg Oral QHS  . mupirocin ointment   Nasal BID  . OLANZapine  2.5 mg Oral QHS  . pantoprazole sodium  40 mg Per Tube Daily  . simvastatin  20 mg Oral QHS  . sodium chloride flush  3 mL Intravenous Q12H  . vitamin B-12  1,000 mcg Oral Daily   Continuous Infusions: . sodium chloride 250 mL (06/16/17 0539)     LOS: 2 days    Time spent: 38 minutes.    Dana Allan, MD  [facility] Pager #: [phone] 7PM-7AM contact night coverage as above

## 2017-06-16 NOTE — Progress Notes (Signed)
Patient not placed on CPAP.  Patient seems confused and kept pushing RT away when talking to pt about CPAP.

## 2017-06-16 NOTE — Progress Notes (Signed)
Initial Nutrition Assessment  DOCUMENTATION CODES:   Not applicable  INTERVENTION:    Ensure Enlive po daily, each supplement provides 350 kcal and 20 grams of protein  NUTRITION DIAGNOSIS:   Increased nutrient needs related to acute illness as evidenced by estimated needs  GOAL:   Patient will meet greater than or equal to 90% of their needs  MONITOR:   PO intake, Supplement acceptance, Labs, Skin, Weight trends  REASON FOR ASSESSMENT:   Consult Assessment of nutrition requirement/status  ASSESSMENT:   84 y.o. Female with PMH significant for dementia, depression, anxiety, and OSA on CPAP. History from the patient's daughter earlier today suggests that the patient likely has a dementing illness.  Patient presented with generalized weakness, lethargy, and recurrent falls.  RD spoke with pt's daughter at bedside. Pt sleeping soundly. Daughter reports pt ate well this am. She had pancakes, oatmeal, apples and coffee. Also states pt has drank Ensure supplements in the past. Likes.  Daughter unaware if pt has had any weight loss recently. Labs and medications reviewed. CBG's [id number]. Pt has no chewing or swallowing difficulty.  NUTRITION - FOCUSED PHYSICAL EXAM:  Unable to complete at this time.  Diet Order:  Diet Heart Room service appropriate? Yes; Fluid consistency: Thin  EDUCATION NEEDS:   Not appropriate for education at this time  Skin:     Last BM:  2/13  Height:   Ht Readings from Last 1 Encounters:  06/14/17 5\' 3"  (1.6 m)    Weight:   Wt Readings from Last 1 Encounters:  06/14/17 124 lb (56.2 kg)    Ideal Body Weight:  52.2 kg  BMI:  Body mass index is 21.97 kg/m.  Estimated Nutritional Needs:   Kcal:  1200-1400  Protein:  50-65 gm  Fluid:  >/= 1.5 L  Arthur Holms, RD, LDN Pager #: [phone] After-Hours Pager #: [phone]

## 2017-06-16 NOTE — Progress Notes (Signed)
  Echocardiogram 2D Echocardiogram has been performed.  Cristina Middleton L Androw 06/16/2017, 3:27 PM

## 2017-06-17 ENCOUNTER — Inpatient Hospital Stay (HOSPITAL_COMMUNITY): Payer: Medicare Other

## 2017-06-17 LAB — GLUCOSE, CAPILLARY
GLUCOSE-CAPILLARY: 97 mg/dL (ref 65–99)
GLUCOSE-CAPILLARY: 89 mg/dL (ref 65–99)
Glucose-Capillary: 79 mg/dL (ref 65–99)
Glucose-Capillary: 71 mg/dL (ref 65–99)

## 2017-06-17 NOTE — Progress Notes (Signed)
PROGRESS NOTE    Cristina Middleton  BHA:[id number] DOB: May 11, 1920 DOA: 06/13/2017 PCP: Cristina Gravel, MD  Outpatient Specialists:     Brief Narrative:  Cristina Middleton [place] is a 84 y.o. Caucasian female with past medical history significant for dementia, depression, anxiety, and OSA on CPAP, hypertension, hyperlipidemia, documented diabetes mellitus without complication.  Patient resides at the memory unit of of the facility.  However, there is no documented dementia.  History from the patient's daughter earlier today suggests that the patient likely has a dementing illness.  Patient is unsteady on her feet and prone to falls at baseline.  Patient presented to the emergency room with generalized weakness, recurrent falls, worsening lethargy for couple of days prior to presentation.  Patient was said to have slipped out of her recliner on the day of admission, and was also found down on the floor of her room after an unwitnessed fall the day before.  Patient reported aches and pains after the fall, and was to have developed a mild cough.  Temperature of 38.2 C was then documented in the ED on the day of admission, with O2 sat of 87% on room air.  Chest x-ray on admission was negative for infiltrates.  CT scan of the head and cervical spine were both unremarkable.  Elevated MCV, low hemoglobin, and low platelets noted.  WBC is low normal.  Vitamin B12 and folate levels are within normal range.  I suspect patient may have myelodysplastic syndrome as well, with associated marrow suppression.  Albumin is 2.8.  Urinalysis is non-revealing.  Lactic acid is within normal range.  Pro-calcitonin is 0.10.  Patient seen mildly volume depleted on presentation.  No obvious source of infection.  06/15/2017: Patient seen alongside patient's daughter and patient's nurse.  Patient remains lethargic and sleepy.  Will review and adjust patient's medications.  Chest x-ray revealed pulmonary edema/congestive heart failure.  We will  pursue echo, and also check cardiac BNP.  Will cautiously diurese the patient.  We will continue to monitor patient's renal function and electrolytes.  Potassium is 3.3 today.  Will go ahead and replete patient's potassium.  I discussed goal of care, end-of-life issues, and possibility of consulting palliative care team with the patient's daughter.  I also communicated to the patient's daughter that the patient's condition was guarded.  Patient's daughter has promised to produce a copy of [employer] from fiiied by the patient earlier.  My suspicion is that the patient had elected to be DO NOT RESUSCITATE.  We will await the production of the document.  As documented above, patient's prognosis is guarded.  We will continue to manage the patient expectantly.     Assessment & Plan:   Principal Problem: SIRS (systemic inflammatory response syndrome) (HCC) Encephalopathy, etiology unclear.  This could be possibl metabolic. Pulmonary edema/congestive heart failure, type unknown. Failure to thrive. Hypoalbuminemia. Hypokalemia Likely undiagnosed myelodysplastic syndrome. Likely undiagnosed dementia with behavioral problems. Active Problems:   HTN (hypertension)   Diabetes mellitus, type 2 (HCC)   Sleep apnea   Hypothyroidism   Acute respiratory failure with hypoxia (HCC)   Macrocytic anemia   Multiple falls   SIRS: -On presentation, there was documented fever. -However, pro-calcitonin is within normal limits. -No obvious source of infection today. -Patient is currently on broad spectrum antibiotics empirically. -No further documented fever. -No leukocytosis, however, the patient has some form of myelosuppression. -We will continue to follow culture results. -We will complete workup. -Resolved.  Encephalopathy, likely metabolic/lethargic: -Etiology is  likely multifactorial. -Patient likely has a dementing illness that may have altered sense of day and night. -Patient's daughter tells  me that the patient has nights that she would stay awake and difficult to manage. -Patient is also on medications that can alter the mind. -Will review and adjust patient's medication. -Resolving.  Pulmonary edema/congestive heart failure of unknown type: -There is no prior documentation of congestive heart failure. -We will check cardiac BNP and echocardiogram. -Goal of care will direct further  workup. -Patient also has low albumin.  Pulmonary edema noted on the chest x-ray could be some form of capillary leak/decreased oncotic pressure. -Will start patient on IV Lasix cautiously. -We will continue to monitor renal function and electrolytes. -Low albumin in this patient may indicate guarded prognosis. -06/17/2017: Discussed CODE STATUS extensively with patient's daughter.  The the daughter wants patient to be DNR.  Also consult the palliative care team.   -Guarded prognosis. -Chest x-ray done today, 06/17/2088 has not revealed any significant change.  Failure to thrive: -Patient has been having recurrent falls at the facility. -Patient is noted to be weak. -We will manage expectantly. -Albumin on presentation was 2.8.  This decreased to 2.4 earlier today. -Guarded prognosis.  Fever: -This was documented on admission in the ER.  No further fever has been documented.  However, patient is on broad-spectrum antibiotics. -Continue to monitor.   -Follow culture results. -Low threshold to pursue CT of the chest without contrast if the abnormal finding noted on today's chest x-ray does not resolve completely with diuresis.  For now, the abnormal finding is deemed to be secondary to pulmonary edema.  Hypokalemia: -Continue to monitor and replete. -Resolved. -Check magnesium level. -Magnesium was 1.8 on 06/16/2016.1`  Likely undiagnosed myelodysplastic syndrome: -MCV is elevated, patient has anemia and low platelet count. -Continue to monitor for now. -Problem management will depend on  goal of care.  Likely dementia with behavioral problems: -Patient's daughter reports prior behavior problems. -Patient currently resides at the memory unit. -Patient is on oral olanzapine, and other mind altering medications. -Will adjust patient's medications accordingly.  Multiple falls: -This may be related to the patient's weakness and failure to thrive. -We will consult physical therapy team when patient improves clinically. -At the moment, the patient will be too weak to participate with physical therapy.  Acute respiratory failure, with hypoxia: -Hypoxia was noted on admission. -O2 sat done today on room air was 87%. -We will continue supplemental oxygen. -We will treat pulmonary edema/congestive heart failure with diuresis. -Further management will depend on Middleton course.  Hypertension: -We will continue to optimize.  Documented diabetes mellitus: -Patient is not on any medication for diabetes mellitus at the moment. -I guess that this might be diet controlled diabetes mellitus. -Will start Accu-Cheks. -Further management will depend on Middleton course.  Overall, the patient is very ill.  Patient's long-term prognosis is guarded.  Patient is now DNR.  Will consult palliative care team.   DVT prophylaxis: SCD. Code Status: Unclear at the moment.  Family Communication: Daughter.  Disposition Plan: This will depend on Middleton course.    Consultants:   None  Procedures:   None  Antimicrobials:   Aztreonam  Levaquin  Vancomycin  (Will screen for MRSA.  We will discontinue vancomycin if this comes back negative)   Subjective: Patient is lethargic. Patient cannot give any history.  Objective: Vitals:   06/16/17 1943 06/17/17 0504 06/17/17 0736 06/17/17 1341  BP: 115/65 (!) 108/44 (!) 126/55 (!) 118/52  Pulse:  90 87 80 79  Resp:    16  Temp: 99.1 F (37.3 C) (!) 97.3 F (36.3 C) 98.7 F (37.1 C) (!) 96.8 F (36 C)  TempSrc: Axillary Axillary  Oral Oral  SpO2: 94% 95% 98% 98%  Weight:      Height:       No intake or output data in the 24 hours ending 06/17/17 1800 Filed Weights   06/14/17 0000  Weight: 56.2 kg (124 lb)    Examination:  General exam: Lethargic.  Sleepy.  Comfortable at the moment.   Respiratory system: Decreased air entry.  Transmitted sounds.   Cardiovascular system: S1 & S2. No pedal edema. Gastrointestinal system: Abdomen is nondistended, soft and nontender. No organomegaly or masses felt. Normal bowel sounds heard. Central nervous system: Alert and oriented. No focal neurological deficits. Extremities: No leg edema.   Data Reviewed: I have personally reviewed following labs and imaging studies  CBC: Recent Labs  Lab 06/13/17 2230 06/14/17 0447 06/15/17 0810  WBC 4.4 5.0 4.5  NEUTROABS 3.3 3.5 3.2  HGB 9.7* 9.1* 9.4*  HCT 28.7* 27.9* 28.5*  MCV 102.5* 102.2* 101.1*  PLT 140* 144* 578*   Basic Metabolic Panel: Recent Labs  Lab 06/13/17 2230 06/14/17 0447 06/15/17 0810 06/16/17 0649  NA 139 139 137 141  K 3.5 3.5 3.3* 4.6  CL 105 104 103 107  CO2 24 23 24 24   GLUCOSE 115* 80 75 88  BUN 20 18 14 17   CREATININE 0.81 0.68 0.72 0.83  CALCIUM 8.6* 8.2* 8.1* 8.3*  MG  --   --   --  1.8  PHOS  --   --  3.7 3.3   GFR: Estimated Creatinine Clearance: 34.3 mL/min (by C-G formula based on SCr of 0.83 mg/dL). Liver Function Tests: Recent Labs  Lab 06/13/17 2230 06/15/17 0810 06/16/17 0649  AST 27  --   --   ALT 18  --   --   ALKPHOS 70  --   --   BILITOT 0.6  --   --   PROT 4.9*  --   --   ALBUMIN 2.8* 2.4* 2.3*   Recent Labs  Lab 06/13/17 2221  LIPASE 29   No results for input(s): AMMONIA in the last 168 hours. Coagulation Profile: No results for input(s): INR, PROTIME in the last 168 hours. Cardiac Enzymes: No results for input(s): CKTOTAL, CKMB, CKMBINDEX, TROPONINI in the last 168 hours. BNP (last 3 results) No results for input(s): PROBNP in the last 8760  hours. HbA1C: Recent Labs    06/16/17 0649  HGBA1C 5.3   CBG: Recent Labs  Lab 06/16/17 1625 06/16/17 2056 06/17/17 0644 06/17/17 1154 06/17/17 1619  GLUCAP 84 97 79 97 89   Lipid Profile: No results for input(s): CHOL, HDL, LDLCALC, TRIG, CHOLHDL, LDLDIRECT in the last 72 hours. Thyroid Function Tests: No results for input(s): TSH, T4TOTAL, FREET4, T3FREE, THYROIDAB in the last 72 hours. Anemia Panel: No results for input(s): VITAMINB12, FOLATE, FERRITIN, TIBC, IRON, RETICCTPCT in the last 72 hours. Urine analysis:    Component Value Date/Time   COLORURINE YELLOW 06/13/2017 2356   APPEARANCEUR CLEAR 06/13/2017 2356   LABSPEC 1.025 06/13/2017 2356   PHURINE 5.0 06/13/2017 2356   GLUCOSEU NEGATIVE 06/13/2017 2356   HGBUR NEGATIVE 06/13/2017 2356   BILIRUBINUR NEGATIVE 06/13/2017 2356   KETONESUR NEGATIVE 06/13/2017 2356   PROTEINUR NEGATIVE 06/13/2017 2356   UROBILINOGEN 0.2 06/28/2014 0456   NITRITE NEGATIVE 06/13/2017 2356   LEUKOCYTESUR  NEGATIVE 06/13/2017 2356   Sepsis Labs: @LABRCNTIP (procalcitonin:4,lacticidven:4)  ) Recent Results (from the past 240 hour(s))  Blood culture (routine x 2)     Status: None (Preliminary result)   Collection Time: 06/13/17 11:50 PM  Result Value Ref Range Status   Specimen Description BLOOD RIGHT ARM  Final   Special Requests   Final    BOTTLES DRAWN AEROBIC AND ANAEROBIC Blood Culture adequate volume   Culture   Final    NO GROWTH 3 DAYS Performed at [facility], [place] [address], [place], [place] [address]    Report Status PENDING  Incomplete  Respiratory Panel by PCR     Status: None   Collection Time: 06/14/17 12:09 AM  Result Value Ref Range Status   Adenovirus NOT DETECTED NOT DETECTED Final   Coronavirus 229E NOT DETECTED NOT DETECTED Final   Coronavirus HKU1 NOT DETECTED NOT DETECTED Final   Coronavirus NL63 NOT DETECTED NOT DETECTED Final   Coronavirus OC43 NOT DETECTED NOT DETECTED Final   Metapneumovirus  NOT DETECTED NOT DETECTED Final   Rhinovirus / Enterovirus NOT DETECTED NOT DETECTED Final   Influenza A NOT DETECTED NOT DETECTED Final   Influenza A H1 NOT DETECTED NOT DETECTED Final   Influenza A H1 2009 NOT DETECTED NOT DETECTED Final   Influenza A H3 NOT DETECTED NOT DETECTED Final   Influenza B NOT DETECTED NOT DETECTED Final   Parainfluenza Virus 1 NOT DETECTED NOT DETECTED Final   Parainfluenza Virus 2 NOT DETECTED NOT DETECTED Final   Parainfluenza Virus 3 NOT DETECTED NOT DETECTED Final   Parainfluenza Virus 4 NOT DETECTED NOT DETECTED Final   Respiratory Syncytial Virus NOT DETECTED NOT DETECTED Final   Bordetella pertussis NOT DETECTED NOT DETECTED Final   Chlamydophila pneumoniae NOT DETECTED NOT DETECTED Final   Mycoplasma pneumoniae NOT DETECTED NOT DETECTED Final    Comment: Performed at [facility], [place]. [address], [place], [place] [address]  Blood culture (routine x 2)     Status: None (Preliminary result)   Collection Time: 06/14/17 12:18 AM  Result Value Ref Range Status   Specimen Description BLOOD LEFT ANTECUBITAL  Final   Special Requests   Final    BOTTLES DRAWN AEROBIC AND ANAEROBIC Blood Culture adequate volume   Culture   Final    NO GROWTH 3 DAYS Performed at [facility], [place] [address], [place], [place] [address]    Report Status PENDING  Incomplete  MRSA PCR Screening     Status: Abnormal   Collection Time: 06/16/17  6:43 AM  Result Value Ref Range Status   MRSA by PCR POSITIVE (A) NEGATIVE Final    Comment:        The GeneXpert MRSA Assay (FDA approved for NASAL specimens only), is one component of a comprehensive MRSA colonization surveillance program. It is not intended to diagnose MRSA infection nor to guide or monitor treatment for MRSA infections. RESULT CALLED TO, READ BACK BY AND VERIFIED WITH: Z. Calwitan RN 9:35 06/16/17 ([email or web address])          Radiology Studies: Dg Chest Port 1 View  Result Date:  06/17/2017 CLINICAL DATA:  Pulmonary edema. EXAM: PORTABLE CHEST 1 VIEW COMPARISON:  Chest x-ray from yesterday. FINDINGS: Stable cardiomegaly, central vascular congestion, and perihilar and lower lobe predominant alveolar and interstitial edema, not significantly changed. Layering left-sided pleural effusion is also unchanged. No consolidation or pneumothorax. No acute osseous abnormality. Large hiatal hernia again noted. IMPRESSION: 1. No  significant interval change in pulmonary edema and layering pleural effusion. Electronically Signed   By: Titus Dubin M.D.   On: 06/17/2017 10:04   Dg Chest Port 1 View  Result Date: 06/16/2017 CLINICAL DATA:  CHF EXAM: PORTABLE CHEST 1 VIEW COMPARISON:  Yesterday FINDINGS: Cardiopericardial enlargement and hilar vascular enlargement. There is a large hiatal hernia partially containing gas. Presume layering pleural fluid. Generalized interstitial coarsening that is improved. IMPRESSION: 1. Questionable improvement in pulmonary edema since yesterday. 2. Layering pleural fluid greater on the left. 3. Large hiatal hernia. Electronically Signed   By: Monte Fantasia M.D.   On: 06/16/2017 07:25        Scheduled Meds: . artificial tears   Both Eyes BID  . calcium carbonate  500 mg of elemental calcium Oral Q breakfast  . divalproex  125 mg Oral Q12H  . escitalopram  10 mg Oral Daily  . feeding supplement (ENSURE ENLIVE)  237 mL Oral Q1500  . insulin aspart  0-9 Units Subcutaneous TID WC  . ketotifen  1 drop Both Eyes BID  . levothyroxine  100 mcg Oral QAC breakfast  . Melatonin  3 mg Oral QHS  . mupirocin ointment   Nasal BID  . OLANZapine  2.5 mg Oral QHS  . pantoprazole sodium  40 mg Per Tube Daily  . simvastatin  20 mg Oral QHS  . sodium chloride flush  3 mL Intravenous Q12H  . vitamin B-12  1,000 mcg Oral Daily   Continuous Infusions: . sodium chloride 250 mL (06/16/17 0539)     LOS: 3 days    Time spent: 38 minutes.    Dana Allan,  MD  [facility] Pager #: [phone] 7PM-7AM contact night coverage as above

## 2017-06-17 NOTE — Progress Notes (Signed)
Pt not place on CPAP tonight due to mental status.  Patient is still confused.

## 2017-06-17 NOTE — Plan of Care (Signed)
  Nutrition: Adequate nutrition will be maintained 06/17/2017 1139 - Progressing by Williams Che, RN   Elimination: Will not experience complications related to bowel motility 06/17/2017 1139 - Progressing by Williams Che, RN   Safety: Ability to remain free from injury will improve 06/17/2017 1139 - Progressing by Williams Che, RN   Skin Integrity: Risk for impaired skin integrity will decrease 06/17/2017 1139 - Progressing by Williams Che, RN

## 2017-06-18 DIAGNOSIS — Z515 Encounter for palliative care: Secondary | ICD-10-CM

## 2017-06-18 DIAGNOSIS — I509 Heart failure, unspecified: Secondary | ICD-10-CM

## 2017-06-18 DIAGNOSIS — Z7189 Other specified counseling: Secondary | ICD-10-CM

## 2017-06-18 LAB — GLUCOSE, CAPILLARY
GLUCOSE-CAPILLARY: 112 mg/dL — AB (ref 65–99)
GLUCOSE-CAPILLARY: 118 mg/dL — AB (ref 65–99)
Glucose-Capillary: 58 mg/dL — ABNORMAL LOW (ref 65–99)
Glucose-Capillary: 98 mg/dL (ref 65–99)
Glucose-Capillary: 128 mg/dL — ABNORMAL HIGH (ref 65–99)

## 2017-06-18 NOTE — Plan of Care (Signed)
Please see previous note.

## 2017-06-18 NOTE — Progress Notes (Signed)
Received Pt asleep on bed. Pt ate breakfast and given orange juice, rechecked at 0744 CBG=98. Daughter at bedside updated on Pt's condition. Will continue to monitor.

## 2017-06-18 NOTE — Consult Note (Signed)
Consultation Note Date: 06/18/2017   Patient Name: Cristina Middleton [id number]  DOB: Jan 15, 1921  MRN: [id number]  Age / Sex: 84 y.o., female  PCP: Jani Gravel, MD Referring Physician: Bonnell Public, MD  Reason for Consultation: Establishing goals of care and Psychosocial/spiritual support  HPI/Patient Profile: 84 y.o. female  with past medical history of moderate to severe dementia, obstructive sleep apnea, hypertension, hyperlipidemia, diabetes, admitted on 06/13/2017 with increased weakness and altered mental status patient was admitted from a memory care unit in [facility].  Consult for goals of care.   Clinical Assessment and Goals of Care: Met with patient, chart reviewed, as well as patient's daughter, [name]. Introduced palliative medicine as an Chemical engineer and source of support for patient and her family in the setting of serious illness.  The majority of our conversation centered around dementia, disease progression and what they may be encountering going forward as dementia worsens.  Specifically, aspiration worsening debility UTI, dysphasia.  Open discussions regarding artificial feeding, risks and benefits.  Shared with patient's daughter that in the setting of dementia PEG tubes are not indicated, not necessarily prolonging life and they do not prevent aspiration  Patient is already experiencing talking to deceased family members and has had episodes in the past of visual hallucinations (she does have macular degeneration).  While in the room with her today, she specifically was ruminating on getting dinner ready, preparing for family to come over  Patient at this point does not have capacity secondary to her advanced dementia.  Her 2 children, Jasmine Pang, and son Tharon Aquas are making healthcare decisions together.  Their names and numbers are listed in epic under demographics.     SUMMARY OF RECOMMENDATIONS   Confirmed DNR DNI Recommend when patient is discharged back to a skilled nursing facility or memory care unit , that she continue to be followed by palliative care provider in the community.  Consult placed to social work with palliative care resources in the community contact information Code Status/Advance Care Planning:  DNR    Symptom Management:   Agitation: Daughter reports that agitation has improved on current medication regimen that was started at [facility] psychiatric unit.  Going forward, Depakote is an option that could be increased to help manage agitation as well as increased Zyprexa to 5 mg nightly  Palliative Prophylaxis:   Aspiration, Bowel Regimen, Delirium Protocol, Eye Care, Frequent Pain Assessment, Oral Care and Turn Reposition  Psycho-social/Spiritual:   Desire for further Chaplaincy support:no  Additional Recommendations: Referral to Community Resources   Prognosis:   Unable to determine  Discharge Planning: [place] for rehab with Palliative care service follow-up      Primary Diagnoses: Present on Admission: . Hypothyroidism . Sleep apnea . HTN (hypertension) . SIRS (systemic inflammatory response syndrome) (H[facility]) . Macrocytic anemia . Acute respiratory failure with hypoxia ([place])   I have reviewed the medical record, interviewed the patient and family, and examined the patient. The following aspects are pertinent.  Past Medical History:  Diagnosis Date  . Anxiety   . Arthritis   . Diabetes mellitus without complication ([place])   . Dislocated shoulder    WAS POPPED BACK IN PLACE  2006???  . GERD (gastroesophageal reflux disease)    TAKES  ACIPHEX  . Hyperlipidemia   . Hypertension   . Hyperthyroidism    NOT SURE WHICH ONE  . PONV (postoperative nausea and vomiting)    YRS AGO.....(6-7 YRS)  . Sleep apnea    WEARS MASK EVERY NOW AND THEN   Social History   Socioeconomic History   . Marital status: Widowed    Spouse name: None  . Number of children: None  . Years of education: None  . Highest education level: None  Social Needs  . Financial resource strain: None  . Food insecurity - worry: None  . Food insecurity - inability: None  . Transportation needs - medical: None  . Transportation needs - non-medical: None  Occupational History  . None  Tobacco Use  . Smoking status: Former Smoker    Packs/day: 1.00    Types: Cigarettes    Last attempt to quit: 05/02/1998    Years since quitting: 19.1  . Smokeless tobacco: Never Used  Substance and Sexual Activity  . Alcohol use: No  . Drug use: No  . Sexual activity: No  Other Topics Concern  . None  Social History Narrative  . None   Family History  Problem Relation Age of Onset  . Other Mother   . Other Father   . Breast cancer Sister   . Colon cancer Sister   . CAD Sister   . Marfan syndrome Sister   . CAD Brother   . Marfan syndrome Brother    Scheduled Meds: . artificial tears   Both Eyes BID  . calcium carbonate  500 mg of elemental calcium Oral Q breakfast  . divalproex  125 mg Oral Q12H  . escitalopram  10 mg Oral Daily  . feeding supplement (ENSURE ENLIVE)  237 mL Oral Q1500  . ketotifen  1 drop Both Eyes BID  . levothyroxine  100 mcg Oral QAC breakfast  . Melatonin  3 mg Oral QHS  . mupirocin ointment   Nasal BID  . OLANZapine  2.5 mg Oral QHS  . pantoprazole sodium  40 mg Per Tube Daily  . simvastatin  20 mg Oral QHS  . sodium chloride flush  3 mL Intravenous Q12H  . vitamin B-12  1,000 mcg Oral Daily   Continuous Infusions: . sodium chloride 250 mL (06/16/17 0539)   PRN Meds:.sodium chloride, acetaminophen **OR** acetaminophen, HYDROcodone-acetaminophen, loratadine, LORazepam, ondansetron **OR** ondansetron (ZOFRAN) IV, polyethylene glycol, polyvinyl alcohol, sodium chloride flush Medications Prior to Admission:  Prior to Admission medications   Medication Sig Start Date End  Date Taking? Authorizing Provider  acetaminophen (TYLENOL) 325 MG tablet Take 650 mg by mouth at bedtime.    Yes [provider]  Artificial Tear Ointment ([place] PETROL-MINERAL OIL-LANOLIN) 0.1-0.1 % OINT Place 1 application into both eyes 2 (two) times daily. 06/04/17 07/04/17 Yes [provider]  aspirin EC 81 MG tablet Take 81 mg by mouth daily.    Yes [provider]  calcium carbonate (OSCAL) 1500 (600 Ca) MG TABS tablet Take 600 mg of elemental calcium by mouth daily.   Yes [provider]  divalproex (DEPAKOTE SPRINKLE) 125 MG capsule Take 125 mg by mouth 3 (three) times daily. 06/06/17 07/06/17 Yes [provider]  escitalopram (LEXAPRO) 10 MG  tablet Take 10 mg by mouth daily.    Yes [provider]  ketotifen (ZADITOR) 0.025 % ophthalmic solution Place 1 drop into both eyes 2 (two) times daily. 06/04/17 07/04/17 Yes [provider]  lansoprazole (PREVACID) 3 mg/ml SUSP oral suspension Place 30 mg into feeding tube daily at 12 noon.   Yes [provider]  levothyroxine (SYNTHROID, LEVOTHROID) 100 MCG tablet Take 100 mcg by mouth daily before breakfast.   Yes [provider]  loratadine (CLARITIN) 10 MG tablet Take 10 mg by mouth daily as needed for allergies.    Yes [provider]  LORazepam (ATIVAN) 0.5 MG tablet Take 0.5 mg by mouth daily as needed. 06/07/17  Yes [provider]  Melatonin 3 MG TABS Take 3 mg by mouth at bedtime. 06/06/17 07/06/17 Yes [provider]  OLANZapine (ZYPREXA) 5 MG tablet Take 5 mg by mouth 2 (two) times daily.  06/07/17  Yes [provider]  polyethylene glycol (MIRALAX / GLYCOLAX) packet Take 17 g by mouth daily as needed for moderate constipation.   Yes [provider]  polyvinyl alcohol (LIQUIFILM TEARS) 1.4 % ophthalmic solution Place 1 drop into both eyes as needed for dry eyes.   Yes [provider]  simvastatin (ZOCOR) 20 MG tablet Take 20 mg  by mouth at bedtime.    Yes [provider]  traZODone (DESYREL) 100 MG tablet Take 100 mg by mouth at bedtime. 06/07/17  Yes [provider]  vitamin B-12 (CYANOCOBALAMIN) 500 MCG tablet Take 1,000 mcg by mouth daily.    Yes [provider]   Allergies  Allergen Reactions  . Other Other (See Comments)    Pt is allergic to Alka-Seltzer tablets.   Reaction:  Hallucinations   . Penicillins Hives, Itching, Swelling and Other (See Comments)    Reaction:  Unspecified swelling reaction Has patient had a PCN reaction causing immediate rash, facial/tongue/throat swelling, SOB or lightheadedness with hypotension: Yes Has patient had a PCN reaction causing severe rash involving mucus membranes or skin necrosis: No Has patient had a PCN reaction that required hospitalization No Has patient had a PCN reaction occurring within the last 10 years: No If all of the above answers are "NO", then may proceed with Cephalosporin use.  . Sulfa Antibiotics Hives, Itching, Swelling and Other (See Comments)    Reaction:  Unspecified swelling reaction   Review of Systems  Unable to perform ROS: Dementia    Physical Exam  Constitutional:  Frail elderly female, acutely confused, restless  HENT:  Head: Normocephalic and atraumatic.  Cardiovascular: Normal rate.  Pulmonary/Chest: Effort normal.  Upper airway congestion noted that does clear with cough  Abdominal: Soft.  Musculoskeletal: Normal range of motion.  Very weak 2 person assist to bedside commode and can only pivot Scoliosis  Neurological: She is alert.  Oriented only to self  Skin: Skin is warm and dry. There is pallor.  Psychiatric:  Agitated, confused  Nursing note and vitals reviewed.   Vital Signs: BP (!) 103/44 (BP Location: Left Arm)   Pulse 71   Temp (!) 97 F (36.1 C) (Oral)   Resp 16   Ht '5\' 3"'  (1.6 m)   Wt 56.2 kg (124 lb)   SpO2 95%   BMI 21.97 kg/m  Pain Assessment: PAINAD POSS *See Group  Information*: S-Acceptable,Sleep, easy to arouse Pain Score: 0-No pain   SpO2: SpO2: 95 % O2 Device:SpO2: 95 % O2 Flow Rate: .O2 Flow Rate (L/min): 2 L/min  IO: Intake/output summary: No intake or output data in the 24 hours ending 06/18/17 1730  LBM: Last BM Date: 06/17/17 Baseline Weight: Weight: 56.2 kg (124 lb) Most recent weight: Weight: 56.2 kg (124 lb)     Palliative Assessment/Data:   Flowsheet Rows     Most Recent Value  Intake Tab  Referral Department  Hospitalist  Unit at Time of Referral  Med/Surg Unit  Palliative Care Primary Diagnosis  Other (Comment)  Date Notified  06/17/17  Palliative Care Type  New Palliative care  Reason for referral  Clarify Goals of Care  Date of Admission  06/13/17  Date first seen by Palliative Care  06/18/17  # of days Palliative referral response time  1 Day(s)  # of days IP prior to Palliative referral  4  Clinical Assessment  Palliative Performance Scale Score  30%  Pain Max last 24 hours  Not able to report  Pain Min Last 24 hours  Not able to report  Dyspnea Max Last 24 Hours  Not able to report  Dyspnea Min Last 24 hours  Not able to report  Nausea Max Last 24 Hours  Not able to report  Nausea Min Last 24 Hours  Not able to report  Anxiety Max Last 24 Hours  Not able to report  Anxiety Min Last 24 Hours  Not able to report  Other Max Last 24 Hours  Not able to report  Psychosocial & Spiritual Assessment  Palliative Care Outcomes  Patient/Family meeting held?  Yes  Who was at the meeting?  dtr  Patient/Family wishes: Interventions discontinued/not started   Mechanical Ventilation      Time In: 1600 Time Out: 1710 Time Total: 70 min Greater than 50%  of this time was spent counseling and coordinating care related to the above assessment and plan.  Signed by: Dory Horn, NP   Please contact Palliative Medicine Team phone at [phone] for questions and concerns.  For individual provider: See  Shea Evans

## 2017-06-18 NOTE — Progress Notes (Signed)
PROGRESS NOTE    Cristina Middleton [facility]  KSH:[id number] DOB: August 07, 1920 DOA: 06/13/2017 PCP: Jani Gravel, MD  Outpatient Specialists:     Brief Narrative:  Cristina Middleton is a 84 y.o. Caucasian female with past medical history significant for dementia, depression, anxiety, and OSA on CPAP, hypertension, hyperlipidemia, documented diabetes mellitus without complication.  Patient resides at the memory unit of of the facility.  However, there is no documented dementia.  History from the patient's daughter earlier today suggests that the patient likely has a dementing illness.  Patient is unsteady on her feet and prone to falls at baseline.  Patient presented to the emergency room with generalized weakness, recurrent falls, worsening lethargy for couple of days prior to presentation.  Patient was said to have slipped out of her recliner on the day of admission, and was also found down on the floor of her room after an unwitnessed fall the day before.  Patient reported aches and pains after the fall, and was to have developed a mild cough.  Temperature of 38.2 C was then documented in the ED on the day of admission, with O2 sat of 87% on room air.  Chest x-ray on admission was negative for infiltrates.  CT scan of the head and cervical spine were both unremarkable.  Elevated MCV, low hemoglobin, and low platelets noted.  WBC is low normal.  Vitamin B12 and folate levels are within normal range.  I suspect patient may have myelodysplastic syndrome as well, with associated marrow suppression.  Albumin is 2.8.  Urinalysis is non-revealing.  Lactic acid is within normal range.  Pro-calcitonin is 0.10.  Patient seen mildly volume depleted on presentation.  No obvious source of infection.  06/15/2017: Patient seen alongside patient's daughter and patient's nurse.  Patient remains lethargic and sleepy.  Will review and adjust patient's medications.  Chest x-ray revealed pulmonary edema/congestive heart failure.  We will  pursue echo, and also check cardiac BNP.  Will cautiously diurese the patient.  We will continue to monitor patient's renal function and electrolytes.  Potassium is 3.3 today.  Will go ahead and replete patient's potassium.  I discussed goal of care, end-of-life issues, and possibility of consulting palliative care team with the patient's daughter.  I also communicated to the patient's daughter that the patient's condition was guarded.  Patient's daughter has promised to produce a copy of [employer] from fiiied by the patient earlier.  My suspicion is that the patient had elected to be DO NOT RESUSCITATE.  We will await the production of the document.  As documented above, patient's prognosis is guarded.  We will continue to manage the patient expectantly.    06/18/2017: Patient seen alongside patient's daughter, son-in-law and grandson.  Patient is more awake today.  Episodes of low blood sugar have been noted.  We will pursue caloric count.  Will get physical therapy consult.   Assessment & Plan:   Principal Problem: SIRS (systemic inflammatory response syndrome) (HCC) Encephalopathy, etiology unclear.  This could be possibl metabolic. Pulmonary edema/congestive heart failure, type unknown. Failure to thrive. Hypoalbuminemia. Hypokalemia Likely undiagnosed myelodysplastic syndrome. Likely undiagnosed dementia with behavioral problems. Active Problems:   HTN (hypertension)   Diabetes mellitus, type 2 (HCC)   Sleep apnea   Hypothyroidism   Acute respiratory failure with hypoxia (HCC)   Macrocytic anemia   Multiple falls   SIRS: -On presentation, there was documented fever. -However, pro-calcitonin is within normal limits. -No obvious source of infection today. -Patient is currently  on broad spectrum antibiotics empirically. -No further documented fever. -No leukocytosis, however, the patient has some form of myelosuppression. -We will continue to follow culture results. -We will  complete workup. -Resolved.  Encephalopathy, likely metabolic/lethargic: -Etiology is likely multifactorial. -Patient likely has a dementing illness that may have altered sense of day and night. -Patient's daughter tells me that the patient has nights that she would stay awake and difficult to manage. -Patient is also on medications that can alter the mind. -Will review and adjust patient's medication. -Resolving.  Pulmonary edema/congestive heart failure of unknown type: -There is no prior documentation of congestive heart failure. -We will check cardiac BNP and echocardiogram. -Goal of care will direct further  workup. -Patient also has low albumin.  Pulmonary edema noted on the chest x-ray could be some form of capillary leak/decreased oncotic pressure. -Will start patient on IV Lasix cautiously. -We will continue to monitor renal function and electrolytes. -Low albumin in this patient may indicate guarded prognosis. -06/17/2017: Discussed CODE STATUS extensively with patient's daughter.  The the daughter wants patient to be DNR.  Also consult the palliative care team.   -Guarded prognosis. -Chest x-ray done today, 06/17/2088 has not revealed any significant change. -Continue current management.  Failure to thrive: -Patient has been having recurrent falls at the facility. -Patient is noted to be weak. -We will manage expectantly. -Albumin on presentation was 2.8.  This decreased to 2.4 earlier today. -Guarded prognosis.  Fever: -This was documented on admission in the ER.  No further fever has been documented.  However, patient is on broad-spectrum antibiotics. -Continue to monitor.   -Follow culture results. -Low threshold to pursue CT of the chest without contrast if the abnormal finding noted on today's chest x-ray does not resolve completely with diuresis.  For now, the abnormal finding is deemed to be secondary to pulmonary edema. -No further fever  documented.  Hypoglycemia: -Continue to monitor blood sugar. -Pursue caloric count. -This could possibly be due to poor p.o. intake. -This is likely contributing to episodes of encephalopathy.  Hypokalemia: -Continue to monitor and replete. -Resolved. -Check magnesium level. -Magnesium was 1.8 on 06/16/2017.  Possible undiagnosed myeloid suppression: -MCV is elevated, patient has anemia and low platelet count. -Continue to monitor for now. -Check parvovirus B19 PCR. -Problem management will depend on goal of care.  Likely dementia with behavioral problems: -Patient's daughter reports prior behavior problems. -Patient currently resides at the memory unit. -Patient is on oral olanzapine, and other mind altering medications. -Will adjust patient's medications accordingly.  Multiple falls: -This may be related to the patient's weakness and failure to thrive. -We will consult physical therapy team when patient improves clinically. -At the moment, the patient will be too weak to participate with physical therapy.  Acute respiratory failure, with hypoxia: -Hypoxia was noted on admission. -O2 sat done today on room air was 87%. -We will continue supplemental oxygen. -We will treat pulmonary edema/congestive heart failure with diuresis. -Further management will depend on hospital course.  Hypertension: -We will continue to optimize.  Documented diabetes mellitus: -Patient is not on any medication for diabetes mellitus at the moment. -I guess that this might be diet controlled diabetes mellitus. -Will start Accu-Cheks. -Further management will depend on hospital course.  Overall, the patient is very ill.  Patient's long-term prognosis is guarded.  Patient is now DNR.  Will consult palliative care team.   DVT prophylaxis: SCD. Code Status: Unclear at the moment.  Family Communication: Daughter.  Disposition Plan: This will  depend on hospital course.  Consult physical  therapy.  Patient's daughter is asking about option of skilled nursing facility.  Currently, the patient is in the memory unit.   Consultants:   None  Procedures:   None  Antimicrobials:   Aztreonam  Levaquin  Vancomycin  (Will screen for MRSA.  We will discontinue vancomycin if this comes back negative)   Subjective: Patient is less lethargic today. No significant history from patient.     Objective: Vitals:   06/17/17 1341 06/17/17 2023 06/18/17 0514 06/18/17 1330  BP: (!) 118/52 131/76 (!) 131/59 (!) 103/44  Pulse: 79 74 80 71  Resp: 16 16 15 16   Temp: (!) 96.8 F (36 C) (!) 97.1 F (36.2 C) (!) 97.5 F (36.4 C) (!) 97 F (36.1 C)  TempSrc: Oral Oral Oral Oral  SpO2: 98% 97% 93% 95%  Weight:      Height:       No intake or output data in the 24 hours ending 06/18/17 1718 Filed Weights   06/14/17 0000  Weight: 56.2 kg (124 lb)    Examination:  General exam: Lethargic.  Sleepy.  Comfortable at the moment.   Respiratory system: Decreased air entry.  Transmitted sounds.   Cardiovascular system: S1 & S2. No pedal edema. Gastrointestinal system: Abdomen is nondistended, soft and nontender. No organomegaly or masses felt. Normal bowel sounds heard. Central nervous system: Alert and oriented. No focal neurological deficits. Extremities: No leg edema.   Data Reviewed: I have personally reviewed following labs and imaging studies  CBC: Recent Labs  Lab 06/13/17 2230 06/14/17 0447 06/15/17 0810  WBC 4.4 5.0 4.5  NEUTROABS 3.3 3.5 3.2  HGB 9.7* 9.1* 9.4*  HCT 28.7* 27.9* 28.5*  MCV 102.5* 102.2* 101.1*  PLT 140* 144* 563*   Basic Metabolic Panel: Recent Labs  Lab 06/13/17 2230 06/14/17 0447 06/15/17 0810 06/16/17 0649  NA 139 139 137 141  K 3.5 3.5 3.3* 4.6  CL 105 104 103 107  CO2 24 23 24 24   GLUCOSE 115* 80 75 88  BUN 20 18 14 17   CREATININE 0.81 0.68 0.72 0.83  CALCIUM 8.6* 8.2* 8.1* 8.3*  MG  --   --   --  1.8  PHOS  --   --  3.7  3.3   GFR: Estimated Creatinine Clearance: 34.3 mL/min (by C-G formula based on SCr of 0.83 mg/dL). Liver Function Tests: Recent Labs  Lab 06/13/17 2230 06/15/17 0810 06/16/17 0649  AST 27  --   --   ALT 18  --   --   ALKPHOS 70  --   --   BILITOT 0.6  --   --   PROT 4.9*  --   --   ALBUMIN 2.8* 2.4* 2.3*   Recent Labs  Lab 06/13/17 2221  LIPASE 29   No results for input(s): AMMONIA in the last 168 hours. Coagulation Profile: No results for input(s): INR, PROTIME in the last 168 hours. Cardiac Enzymes: No results for input(s): CKTOTAL, CKMB, CKMBINDEX, TROPONINI in the last 168 hours. BNP (last 3 results) No results for input(s): PROBNP in the last 8760 hours. HbA1C: Recent Labs    06/16/17 0649  HGBA1C 5.3   CBG: Recent Labs  Lab 06/17/17 1619 06/17/17 2021 06/18/17 0615 06/18/17 0744 06/18/17 1146  GLUCAP 89 71 58* 98 112*   Lipid Profile: No results for input(s): CHOL, HDL, LDLCALC, TRIG, CHOLHDL, LDLDIRECT in the last 72 hours. Thyroid Function Tests: No results for  input(s): TSH, T4TOTAL, FREET4, T3FREE, THYROIDAB in the last 72 hours. Anemia Panel: No results for input(s): VITAMINB12, FOLATE, FERRITIN, TIBC, IRON, RETICCTPCT in the last 72 hours. Urine analysis:    Component Value Date/Time   COLORURINE YELLOW 06/13/2017 2356   APPEARANCEUR CLEAR 06/13/2017 2356   LABSPEC 1.025 06/13/2017 2356   PHURINE 5.0 06/13/2017 2356   GLUCOSEU NEGATIVE 06/13/2017 2356   HGBUR NEGATIVE 06/13/2017 2356   BILIRUBINUR NEGATIVE 06/13/2017 2356   KETONESUR NEGATIVE 06/13/2017 2356   PROTEINUR NEGATIVE 06/13/2017 2356   UROBILINOGEN 0.2 06/28/2014 0456   NITRITE NEGATIVE 06/13/2017 2356   LEUKOCYTESUR NEGATIVE 06/13/2017 2356   Sepsis Labs: @LABRCNTIP (procalcitonin:4,lacticidven:4)  ) Recent Results (from the past 240 hour(s))  Blood culture (routine x 2)     Status: None (Preliminary result)   Collection Time: 06/13/17 11:50 PM  Result Value Ref Range  Status   Specimen Description BLOOD RIGHT ARM  Final   Special Requests   Final    BOTTLES DRAWN AEROBIC AND ANAEROBIC Blood Culture adequate volume   Culture   Final    NO GROWTH 4 DAYS Performed at [facility], [place] [address] [place], [place] [address]    Report Status PENDING  Incomplete  Respiratory Panel by PCR     Status: None   Collection Time: 06/14/17 12:09 AM  Result Value Ref Range Status   Adenovirus NOT DETECTED NOT DETECTED Final   Coronavirus 229E NOT DETECTED NOT DETECTED Final   Coronavirus HKU1 NOT DETECTED NOT DETECTED Final   Coronavirus NL63 NOT DETECTED NOT DETECTED Final   Coronavirus OC43 NOT DETECTED NOT DETECTED Final   Metapneumovirus NOT DETECTED NOT DETECTED Final   Rhinovirus / Enterovirus NOT DETECTED NOT DETECTED Final   Influenza A NOT DETECTED NOT DETECTED Final   Influenza A H1 NOT DETECTED NOT DETECTED Final   Influenza A H1 2009 NOT DETECTED NOT DETECTED Final   Influenza A H3 NOT DETECTED NOT DETECTED Final   Influenza B NOT DETECTED NOT DETECTED Final   Parainfluenza Virus 1 NOT DETECTED NOT DETECTED Final   Parainfluenza Virus 2 NOT DETECTED NOT DETECTED Final   Parainfluenza Virus 3 NOT DETECTED NOT DETECTED Final   Parainfluenza Virus 4 NOT DETECTED NOT DETECTED Final   Respiratory Syncytial Virus NOT DETECTED NOT DETECTED Final   Bordetella pertussis NOT DETECTED NOT DETECTED Final   Chlamydophila pneumoniae NOT DETECTED NOT DETECTED Final   Mycoplasma pneumoniae NOT DETECTED NOT DETECTED Final    Comment: Performed at [facility], [place] [address] [place], [place] [address]  Blood culture (routine x 2)     Status: None (Preliminary result)   Collection Time: 06/14/17 12:18 AM  Result Value Ref Range Status   Specimen Description BLOOD LEFT ANTECUBITAL  Final   Special Requests   Final    BOTTLES DRAWN AEROBIC AND ANAEROBIC Blood Culture adequate volume   Culture   Final    NO GROWTH 4 DAYS Performed at [facility], [place] [address], [place], [place] [address]    Report Status PENDING  Incomplete  MRSA PCR Screening     Status: Abnormal   Collection Time: 06/16/17  6:43 AM  Result Value Ref Range Status   MRSA by PCR POSITIVE (A) NEGATIVE Final    Comment:        The GeneXpert MRSA Assay (FDA approved for NASAL specimens only), is one component of a comprehensive MRSA colonization surveillance program. It is not intended to  diagnose MRSA infection nor to guide or monitor treatment for MRSA infections. RESULT CALLED TO, READ BACK BY AND VERIFIED WITH: Z. Calwitan RN 9:35 06/16/17 ([email or web address])          Radiology Studies: Dg Chest Port 1 View  Result Date: 06/17/2017 CLINICAL DATA:  Pulmonary edema. EXAM: PORTABLE CHEST 1 VIEW COMPARISON:  Chest x-ray from yesterday. FINDINGS: Stable cardiomegaly, central vascular congestion, and perihilar and lower lobe predominant alveolar and interstitial edema, not significantly changed. Layering left-sided pleural effusion is also unchanged. No consolidation or pneumothorax. No acute osseous abnormality. Large hiatal hernia again noted. IMPRESSION: 1. No significant interval change in pulmonary edema and layering pleural effusion. Electronically Signed   By: Titus Dubin M.D.   On: 06/17/2017 10:04        Scheduled Meds: . artificial tears   Both Eyes BID  . calcium carbonate  500 mg of elemental calcium Oral Q breakfast  . divalproex  125 mg Oral Q12H  . escitalopram  10 mg Oral Daily  . feeding supplement (ENSURE ENLIVE)  237 mL Oral Q1500  . ketotifen  1 drop Both Eyes BID  . levothyroxine  100 mcg Oral QAC breakfast  . Melatonin  3 mg Oral QHS  . mupirocin ointment   Nasal BID  . OLANZapine  2.5 mg Oral QHS  . pantoprazole sodium  40 mg Per Tube Daily  . simvastatin  20 mg Oral QHS  . sodium chloride flush  3 mL Intravenous Q12H  . vitamin B-12  1,000 mcg Oral Daily   Continuous Infusions: . sodium chloride 250 mL (06/16/17 0539)      LOS: 4 days    Time spent: 35 minutes.    Dana Allan, MD  [facility] Pager #: [phone] 7PM-7AM contact night coverage as above

## 2017-06-18 NOTE — Progress Notes (Addendum)
Cristina Middleton continues to have a productive congested cough, secretions small-moderate amount, reminded to cover cough, pt needs reminders and often does not cover cough.  Facial rash red, dry and flaky.  Complained of generalized joint achiness at HS.  Afebrile, BP and HR WNL, 2.5 L O2 placed at HS per not wearing CPAP.

## 2017-06-18 NOTE — Plan of Care (Signed)
  Nutrition: Adequate nutrition will be maintained 06/18/2017 1023 - Progressing by Williams Che, RN   Elimination: Will not experience complications related to bowel motility 06/18/2017 1023 - Progressing by Williams Che, RN   Pain Managment: General experience of comfort will improve 06/18/2017 1023 - Progressing by Williams Che, RN   Safety: Ability to remain free from injury will improve 06/18/2017 1023 - Progressing by Williams Che, RN   Skin Integrity: Risk for impaired skin integrity will decrease 06/18/2017 1023 - Progressing by Williams Che, RN

## 2017-06-19 DIAGNOSIS — J810 Acute pulmonary edema: Secondary | ICD-10-CM

## 2017-06-19 LAB — GLUCOSE, CAPILLARY
GLUCOSE-CAPILLARY: 97 mg/dL (ref 65–99)
Glucose-Capillary: 78 mg/dL (ref 65–99)
Glucose-Capillary: 86 mg/dL (ref 65–99)
Glucose-Capillary: 107 mg/dL — ABNORMAL HIGH (ref 65–99)

## 2017-06-19 LAB — CULTURE, BLOOD (ROUTINE X 2)
CULTURE: NO GROWTH
Culture: NO GROWTH
Special Requests: ADEQUATE
Special Requests: ADEQUATE

## 2017-06-19 MED ORDER — OLANZAPINE 5 MG PO TABS
5.0000 mg | ORAL_TABLET | Freq: Every day | ORAL | Status: DC
  Administered 2017-06-19 – 2017-06-20 (×2): 5 mg via ORAL
  Filled 2017-06-19 (×2): qty 1

## 2017-06-19 NOTE — Social Work (Addendum)
CSW contacted Amy at [facility] to discuss patient. ALF is sending a nurse to hospital to assess for placement back at ALF or if patient will need SNF.  CSW discussed with daughter at bedside and CSW obtained permission to send out to SNF to ensure we have a back up plan for SNF. CSW will f/u for SNF offers once PT evaluation has been completed.  CSW will f/u.  Elissa Hefty, LCSW Clinical Social Worker [phone]

## 2017-06-19 NOTE — Progress Notes (Signed)
Patient does not wear CPAP at night per RN.

## 2017-06-19 NOTE — Progress Notes (Signed)
Patient not placed on CPAP due to patient inability to remove mask in case of emergency.  Also, patient is at high risk for aspiration.  Patient is sleeping comfortably at this time on 2L nasal canulla.  RN consulted and in agreement with plan.

## 2017-06-19 NOTE — Progress Notes (Signed)
PROGRESS NOTE    Cristina Middleton  BTD:[id number] DOB: 14-Oct-1920 DOA: 06/13/2017 PCP: Cristina Gravel, MD  Outpatient Specialists:     Brief Narrative:  Cristina Middleton is a 84 y.o. Caucasian female with past medical history significant for dementia, depression, anxiety, and OSA on CPAP, hypertension, hyperlipidemia, documented diabetes mellitus without complication.  Patient resides at the memory unit of of the facility.  However, there is no documented dementia.  History from the patient's daughter earlier today suggests that the patient likely has a dementing illness.  Patient is unsteady on her feet and prone to falls at baseline.  Patient presented to the emergency room with generalized weakness, recurrent falls, worsening lethargy for couple of days prior to presentation.  Patient was said to have slipped out of her recliner on the day of admission, and was also found down on the floor of her room after an unwitnessed fall the day before.  Patient reported aches and pains after the fall, and was to have developed a mild cough.  Temperature of 38.2 C was then documented in the ED on the day of admission, with O2 sat of 87% on room air.  Chest x-ray on admission was negative for infiltrates.  CT scan of the head and cervical spine were both unremarkable.  Elevated MCV, low hemoglobin, and low platelets noted.  WBC is low normal.  Vitamin B12 and folate levels are within normal range.  I suspect patient may have myelodysplastic syndrome as well, with associated marrow suppression.  Albumin is 2.8.  Urinalysis is non-revealing.  Lactic acid is within normal range.  Pro-calcitonin is 0.10.  Patient seen mildly volume depleted on presentation.  No obvious source of infection.  06/15/2017: Patient seen alongside patient's daughter and patient's nurse.  Patient remains lethargic and sleepy.  Will review and adjust patient's medications.  Chest x-ray revealed pulmonary edema/congestive heart failure.  We will  pursue echo, and also check cardiac BNP.  Will cautiously diurese the patient.  We will continue to monitor patient's renal function and electrolytes.  Potassium is 3.3 today.  Will go ahead and replete patient's potassium.  I discussed goal of care, end-of-life issues, and possibility of consulting palliative care team with the patient's daughter.  I also communicated to the patient's daughter that the patient's condition was guarded.  Patient's daughter has promised to produce a copy of [place] from fiiied by the patient earlier.  My suspicion is that the patient had elected to be DO NOT RESUSCITATE.  We will await the production of the document.  As documented above, patient's prognosis is guarded.  We will continue to manage the patient expectantly.    06/18/2017: Patient seen alongside patient's daughter, son-in-law and grandson.  Patient is more awake today.  Episodes of low blood sugar have been noted.  We will pursue caloric count.  Will get physical therapy consult.  06/19/2017: Patient was seen alongside patient's daughter.  According to the patient's daughter, the patient is currently hallucinating.  Will increase olanzapine to 5 mg p.o. nightly.  We will continue cautious use of mind altering medications.  Input from physical therapy is appreciated.  For skilled nursing facility placement.  Will repeat chest x-ray in the morning.   Assessment & Plan:   Principal Problem: SIRS (systemic inflammatory response syndrome) (HCC) Encephalopathy, etiology unclear.  This could be possibl metabolic. Pulmonary edema/congestive heart failure, type unknown. Failure to thrive. Hypoalbuminemia. Hypokalemia Likely undiagnosed myelodysplastic syndrome. Likely undiagnosed dementia with behavioral problems. Active  Problems:   HTN (hypertension)   Diabetes mellitus, type 2 (HCC)   Sleep apnea   Hypothyroidism   Acute respiratory failure with hypoxia (HCC)   Macrocytic anemia   Multiple  falls   SIRS: -On presentation, there was documented fever. -However, pro-calcitonin is within normal limits. -No obvious source of infection today. -Patient is currently on broad spectrum antibiotics empirically. -No further documented fever. -No leukocytosis, however, the patient has some form of myelosuppression. -We will continue to follow culture results. -We will complete workup. -Resolved.  Encephalopathy, likely metabolic/lethargic: -Etiology is likely multifactorial. -Patient likely has a dementing illness that may have altered sense of day and night. -Patient's daughter tells me that the patient has nights that she would stay awake and difficult to manage. -Patient is also on medications that can alter the mind. -Will review and adjust patient's medication. -Resolving. -06/19/2017: The patient is said to be hallucinating.  Will increase olanzapine to 5 mg p.o. nightly.  We will continue to monitor closely.  Pulmonary edema/congestive heart failure of unknown type: -There is no prior documentation of congestive heart failure. -We will check cardiac BNP and echocardiogram. -Goal of care will direct further  workup. -Patient also has low albumin.  Pulmonary edema noted on the chest x-ray could be some form of capillary leak/decreased oncotic pressure. -Will start patient on IV Lasix cautiously. -We will continue to monitor renal function and electrolytes. -Low albumin in this patient may indicate guarded prognosis. -06/17/2017: Discussed CODE STATUS extensively with patient's daughter.  The the daughter wants patient to be DNR.  Also consult the palliative care team.   -Guarded prognosis. -Chest x-ray done today, 06/17/2088 has not revealed any significant change. -Continue current management. -06/19/2017: We will check chest x-ray in the morning.  Likely pursue disposition to skilled nursing facility.   Failure to thrive: -Patient has been having recurrent falls at the  facility. -Patient is noted to be weak. -We will manage expectantly. -Albumin on presentation was 2.8.  This decreased to 2.4 earlier today. -Guarded prognosis.  Fever: -This was documented on admission in the ER.  No further fever has been documented.  However, patient is on broad-spectrum antibiotics. -Continue to monitor.   -Follow culture results. -Low threshold to pursue CT of the chest without contrast if the abnormal finding noted on today's chest x-ray does not resolve completely with diuresis.  For now, the abnormal finding is deemed to be secondary to pulmonary edema. -No further fever documented.  Hypoglycemia: -Continue to monitor blood sugar. -Pursue caloric count. -This could possibly be due to poor p.o. intake. -This is likely contributing to episodes of encephalopathy.  Hypokalemia: -Continue to monitor and replete. -Resolved. -Check magnesium level. -Magnesium was 1.8 on 06/16/2017. -06/19/2017: We will check labs in the morning.  Possible undiagnosed myeloid suppression: -MCV is elevated, patient has anemia and low platelet count. -Continue to monitor for now. -Check parvovirus B19 PCR. -Further management will depend on goal of care.  Likely dementia with behavioral problems: -Patient's daughter reports prior behavior problems. -Patient currently resides at the memory unit. -Patient is on oral olanzapine, and other mind altering medications. -Will adjust patient's medications accordingly. -06/20/2007:  Will increase the olanzapine.  Multiple falls: -This may be related to the patient's weakness and failure to thrive. -We will consult physical therapy team when patient improves clinically. -At the moment, the patient will be too weak to participate with physical therapy.  Acute respiratory failure, with hypoxia: -Hypoxia was noted on admission. -O2 sat  done today on room air was 87%. -We will continue supplemental oxygen. -We will treat pulmonary  edema/congestive heart failure with diuresis. -Further management will depend on Middleton course.  Hypertension: -We will continue to optimize.  Documented diabetes mellitus: -Patient is not on any medication for diabetes mellitus at the moment. -I guess that this might be diet controlled diabetes mellitus. -Will start Accu-Cheks. -Further management will depend on Middleton course.  Overall, the patient is very ill.  Patient's long-term prognosis is guarded.  Patient is now DNR.  Will consult palliative care team.   DVT prophylaxis: SCD. Code Status: Unclear at the moment.  Family Communication: Daughter.  Disposition Plan: This will depend on Middleton course.  Consult physical therapy.  Patient's daughter is asking about option of skilled nursing facility.  Currently, the patient is in the memory unit.   Consultants:   None  Procedures:   None  Antimicrobials:   Aztreonam discontinued  Levaquin is continued  Vancomycin discontinued  (Will screen for MRSA.  We will discontinue vancomycin if this comes back negative)   Subjective: No significant history from patient. Patient is more awake today.   Objective: Vitals:   06/18/17 1330 06/18/17 1925 06/19/17 0632 06/19/17 1500  BP: (!) 103/44 136/72 (!) 145/61 (!) 126/53  Pulse: 71 86 79 86  Resp: 16 16 17 17   Temp: (!) 97 F (36.1 C) (!) 96.8 F (36 C) 98.6 F (37 C) 97.7 F (36.5 C)  TempSrc: Oral Oral Oral Oral  SpO2: 95% 91% 99% 100%  Weight:      Height:        Intake/Output Summary (Last 24 hours) at 06/19/2017 1755 Last data filed at 06/19/2017 1500 Gross per 24 hour  Intake 163 ml  Output 1 ml  Net 162 ml   Filed Weights   06/14/17 0000  Weight: 56.2 kg (124 lb)    Examination:  General exam: Not in any distress.     Respiratory system: Decreased air entry.  Transmitted sounds.   Cardiovascular system: S1 & S2. No pedal edema. Gastrointestinal system: Abdomen is nondistended, soft and  nontender. No organomegaly or masses felt. Normal bowel sounds heard. Central nervous system: Alert and oriented. No focal neurological deficits. Extremities: No leg edema.   Data Reviewed: I have personally reviewed following labs and imaging studies  CBC: Recent Labs  Lab 06/13/17 2230 06/14/17 0447 06/15/17 0810  WBC 4.4 5.0 4.5  NEUTROABS 3.3 3.5 3.2  HGB 9.7* 9.1* 9.4*  HCT 28.7* 27.9* 28.5*  MCV 102.5* 102.2* 101.1*  PLT 140* 144* 716*   Basic Metabolic Panel: Recent Labs  Lab 06/13/17 2230 06/14/17 0447 06/15/17 0810 06/16/17 0649  NA 139 139 137 141  K 3.5 3.5 3.3* 4.6  CL 105 104 103 107  CO2 24 23 24 24   GLUCOSE 115* 80 75 88  BUN 20 18 14 17   CREATININE 0.81 0.68 0.72 0.83  CALCIUM 8.6* 8.2* 8.1* 8.3*  MG  --   --   --  1.8  PHOS  --   --  3.7 3.3   GFR: Estimated Creatinine Clearance: 34.3 mL/min (by C-G formula based on SCr of 0.83 mg/dL). Liver Function Tests: Recent Labs  Lab 06/13/17 2230 06/15/17 0810 06/16/17 0649  AST 27  --   --   ALT 18  --   --   ALKPHOS 70  --   --   BILITOT 0.6  --   --   PROT 4.9*  --   --  ALBUMIN 2.8* 2.4* 2.3*   Recent Labs  Lab 06/13/17 2221  LIPASE 29   No results for input(s): AMMONIA in the last 168 hours. Coagulation Profile: No results for input(s): INR, PROTIME in the last 168 hours. Cardiac Enzymes: No results for input(s): CKTOTAL, CKMB, CKMBINDEX, TROPONINI in the last 168 hours. BNP (last 3 results) No results for input(s): PROBNP in the last 8760 hours. HbA1C: No results for input(s): HGBA1C in the last 72 hours. CBG: Recent Labs  Lab 06/18/17 1612 06/18/17 1923 06/19/17 0630 06/19/17 1202 06/19/17 1651  GLUCAP 118* 128* 78 86 107*   Lipid Profile: No results for input(s): CHOL, HDL, LDLCALC, TRIG, CHOLHDL, LDLDIRECT in the last 72 hours. Thyroid Function Tests: No results for input(s): TSH, T4TOTAL, FREET4, T3FREE, THYROIDAB in the last 72 hours. Anemia Panel: No results for  input(s): VITAMINB12, FOLATE, FERRITIN, TIBC, IRON, RETICCTPCT in the last 72 hours. Urine analysis:    Component Value Date/Time   COLORURINE YELLOW 06/13/2017 2356   APPEARANCEUR CLEAR 06/13/2017 2356   LABSPEC 1.025 06/13/2017 2356   PHURINE 5.0 06/13/2017 2356   GLUCOSEU NEGATIVE 06/13/2017 2356   HGBUR NEGATIVE 06/13/2017 2356   BILIRUBINUR NEGATIVE 06/13/2017 2356   KETONESUR NEGATIVE 06/13/2017 2356   PROTEINUR NEGATIVE 06/13/2017 2356   UROBILINOGEN 0.2 06/28/2014 0456   NITRITE NEGATIVE 06/13/2017 2356   LEUKOCYTESUR NEGATIVE 06/13/2017 2356   Sepsis Labs: @LABRCNTIP (procalcitonin:4,lacticidven:4)  ) Recent Results (from the past 240 hour(s))  Blood culture (routine x 2)     Status: None   Collection Time: 06/13/17 11:50 PM  Result Value Ref Range Status   Specimen Description BLOOD RIGHT ARM  Final   Special Requests   Final    BOTTLES DRAWN AEROBIC AND ANAEROBIC Blood Culture adequate volume   Culture   Final    NO GROWTH 5 DAYS Performed at [facility], [place] [address], [place], [place] [address]    Report Status 06/19/2017 FINAL  Final  Respiratory Panel by PCR     Status: None   Collection Time: 06/14/17 12:09 AM  Result Value Ref Range Status   Adenovirus NOT DETECTED NOT DETECTED Final   Coronavirus 229E NOT DETECTED NOT DETECTED Final   Coronavirus HKU1 NOT DETECTED NOT DETECTED Final   Coronavirus NL63 NOT DETECTED NOT DETECTED Final   Coronavirus OC43 NOT DETECTED NOT DETECTED Final   Metapneumovirus NOT DETECTED NOT DETECTED Final   Rhinovirus / Enterovirus NOT DETECTED NOT DETECTED Final   Influenza A NOT DETECTED NOT DETECTED Final   Influenza A H1 NOT DETECTED NOT DETECTED Final   Influenza A H1 2009 NOT DETECTED NOT DETECTED Final   Influenza A H3 NOT DETECTED NOT DETECTED Final   Influenza B NOT DETECTED NOT DETECTED Final   Parainfluenza Virus 1 NOT DETECTED NOT DETECTED Final   Parainfluenza Virus 2 NOT DETECTED NOT DETECTED Final    Parainfluenza Virus 3 NOT DETECTED NOT DETECTED Final   Parainfluenza Virus 4 NOT DETECTED NOT DETECTED Final   Respiratory Syncytial Virus NOT DETECTED NOT DETECTED Final   Bordetella pertussis NOT DETECTED NOT DETECTED Final   Chlamydophila pneumoniae NOT DETECTED NOT DETECTED Final   Mycoplasma pneumoniae NOT DETECTED NOT DETECTED Final    Comment: Performed at [facility], [place] [address] [place], [place] [address]  Blood culture (routine x 2)     Status: None   Collection Time: 06/14/17 12:18 AM  Result Value Ref Range Status   Specimen Description BLOOD LEFT ANTECUBITAL  Final   Special Requests   Final    BOTTLES DRAWN AEROBIC AND ANAEROBIC Blood Culture adequate volume   Culture   Final    NO GROWTH 5 DAYS Performed at [facility], [address] [place], [place] [address]    Report Status 06/19/2017 FINAL  Final  MRSA PCR Screening     Status: Abnormal   Collection Time: 06/16/17  6:43 AM  Result Value Ref Range Status   MRSA by PCR POSITIVE (A) NEGATIVE Final    Comment:        The GeneXpert MRSA Assay (FDA approved for NASAL specimens only), is one component of a comprehensive MRSA colonization surveillance program. It is not intended to diagnose MRSA infection nor to guide or monitor treatment for MRSA infections. RESULT CALLED TO, READ BACK BY AND VERIFIED WITH: Z. Calwitan RN 9:35 06/16/17 ([email or web address])          Radiology Studies: No results found.      Scheduled Meds: . artificial tears   Both Eyes BID  . calcium carbonate  500 mg of elemental calcium Oral Q breakfast  . divalproex  125 mg Oral Q12H  . escitalopram  10 mg Oral Daily  . feeding supplement (ENSURE ENLIVE)  237 mL Oral Q1500  . ketotifen  1 drop Both Eyes BID  . levothyroxine  100 mcg Oral QAC breakfast  . Melatonin  3 mg Oral QHS  . mupirocin ointment   Nasal BID  . OLANZapine  2.5 mg Oral QHS  . pantoprazole sodium  40 mg Per Tube Daily  . simvastatin  20 mg Oral QHS   . sodium chloride flush  3 mL Intravenous Q12H  . vitamin B-12  1,000 mcg Oral Daily   Continuous Infusions: . sodium chloride 250 mL (06/16/17 0539)     LOS: 5 days    Time spent: 35 minutes.    Dana Allan, MD  [facility] Pager #: [phone] 7PM-7AM contact night coverage as above

## 2017-06-19 NOTE — Evaluation (Signed)
Physical Therapy Evaluation Patient Details Name: Cristina Middleton MRN: [id number] DOB: [birth year] Today's Date: 06/19/2017   History of Present Illness  84 yo female with onset of SIRS with pulm edema was admitted from memory unit in ALF.  Has been having falls, lethargic, coughing.  O2 sats have been low, has been having metabolic encephalopathy, medications may be to blame.  FTT, has pulm edema, CHF, low BS, acute respiratory failure with hypoxia, possible myeloid suppression, low K+.  PMH:  DM, HTN, falls  Clinical Impression  Pt was seen for evaluation of her mobility with family present to assist with movement. Pt is up to side of bed with cues, assisted to chair and noted sat was down from 95% pregait to 77% post even on supplemental O2.  Her plan is to ask for SNF placement to increase endurance and to closely monitor her vitals with therapy as well as to give her increased time for therapy in a skilled environment.  Acute therapy will follow for same with RW for standing and gait support, ck sats and increase LE strength and ROM as tolerated.    Follow Up Recommendations SNF    Equipment Recommendations  Rolling walker with 5" wheels    Recommendations for Other Services       Precautions / Restrictions Precautions Precautions: Fall(check O2 sats, using O2) Restrictions Weight Bearing Restrictions: No      Mobility  Bed Mobility Overal bed mobility: Needs Assistance Bed Mobility: Supine to Sit     Supine to sit: Mod assist     General bed mobility comments: used bed pad and support for trunk to lift to side of bed  Transfers Overall transfer level: Needs assistance Equipment used: Rolling walker (2 wheeled);1 person hand held assist Transfers: Sit to/from [facility] Sit to Stand: Mod assist Stand pivot transfers: Mod assist          Ambulation/Gait Ambulation/Gait assistance: Min assist Ambulation Distance (Feet): 6 Feet Assistive device:  Rolling walker (2 wheeled);1 person hand held assist Gait Pattern/deviations: Step-to pattern;Step-through pattern;Shuffle;Decreased stride length;Wide base of support;Trunk flexed;Drifts right/left Gait velocity: reduced Gait velocity interpretation: Below normal speed for age/gender General Gait Details: shuffled steps to chair with O2 sats down to 77% on O2 via nasal cannula  Stairs            Wheelchair Mobility    Modified Rankin (Stroke Patients Only)       Balance Overall balance assessment: History of Falls;Needs assistance Sitting-balance support: Feet supported Sitting balance-Leahy Scale: Fair     Standing balance support: Bilateral upper extremity supported;During functional activity Standing balance-Leahy Scale: Poor                               Pertinent Vitals/Pain Pain Assessment: Faces Faces Pain Scale: No hurt    Home Living Family/patient expects to be discharged to:: Skilled nursing facility                      Prior Function Level of Independence: Needs assistance   Gait / Transfers Assistance Needed: quad cane on the memory unit  ADL's / Homemaking Assistance Needed: memory unit staff for assisting ADL's and to cook, clean, manage meds        Hand Dominance   Dominant Hand: Right    Extremity/Trunk Assessment   Upper Extremity Assessment Upper Extremity Assessment: Overall WFL for tasks assessed  Lower Extremity Assessment Lower Extremity Assessment: Generalized weakness    Cervical / Trunk Assessment Cervical / Trunk Assessment: Kyphotic  Communication   Communication: HOH  Cognition Arousal/Alertness: Lethargic Behavior During Therapy: Flat affect Overall Cognitive Status: History of cognitive impairments - at baseline                                        General Comments General comments (skin integrity, edema, etc.): Pt has a fairly blank expression today, using O2 and at  baseline on quad cane, not feasible to return to ALF first    Exercises     Assessment/Plan    PT Assessment Patient needs continued PT services  PT Problem List Decreased strength;Decreased range of motion;Decreased activity tolerance;Decreased balance;Decreased mobility;Decreased coordination;Decreased knowledge of use of DME;Decreased safety awareness;Cardiopulmonary status limiting activity       PT Treatment Interventions DME instruction;Gait training;Functional mobility training;Therapeutic activities;Therapeutic exercise;Balance training;Neuromuscular re-education;Patient/family education    PT Goals (Current goals can be found in the Care Plan section)  Acute Rehab PT Goals Patient Stated Goal: none stated PT Goal Formulation: With patient/family Time For Goal Achievement: 07/03/17 Potential to Achieve Goals: Good    Frequency Min 2X/week   Barriers to discharge Decreased caregiver support(ALF has fewer staff than SNF, may not be able to monitor) requires close monitoring of O2 sats    Co-evaluation               AM-PAC PT "6 Clicks" Daily Activity  Outcome Measure Difficulty turning over in bed (including adjusting bedclothes, sheets and blankets)?: Unable Difficulty moving from lying on back to sitting on the side of the bed? : Unable Difficulty sitting down on and standing up from a chair with arms (e.g., wheelchair, bedside commode, etc,.)?: Unable Help needed moving to and from a bed to chair (including a wheelchair)?: A Lot Help needed walking in hospital room?: A Little Help needed climbing 3-5 steps with a railing? : Total 6 Click Score: 9    End of Session Equipment Utilized During Treatment: Gait belt;Oxygen Activity Tolerance: Treatment limited secondary to medical complications (Comment);Patient limited by fatigue(O2 sats were 77% after walking) Patient left: in chair;with call bell/phone within reach;with chair alarm set;with family/visitor  present Nurse Communication: Mobility status(O2 sats with activity) PT Visit Diagnosis: Unsteadiness on feet (R26.81);Muscle weakness (generalized) (M62.81);Adult, failure to thrive (R62.7)    Time: 3329-5188 PT Time Calculation (min) (ACUTE ONLY): 26 min   Charges:   PT Evaluation $PT Eval Moderate Complexity: 1 Mod PT Treatments $Gait Training: 8-22 mins   PT G Codes:   PT G-Codes **NOT FOR INPATIENT CLASS** Functional Assessment Tool Used: AM-PAC 6 Clicks Basic Mobility    Ramond Dial 06/19/2017, 5:46 PM   Mee Hives, PT MS Acute Rehab Dept. Number: [place] [facility] [place]

## 2017-06-20 ENCOUNTER — Inpatient Hospital Stay (HOSPITAL_COMMUNITY): Payer: Medicare Other

## 2017-06-20 LAB — CBC WITH DIFFERENTIAL/PLATELET
Basophils Absolute: 0.1 10*3/uL (ref 0.0–0.1)
Basophils Relative: 1 %
Eosinophils Absolute: 0.4 10*3/uL (ref 0.0–0.7)
Eosinophils Relative: 10 %
HCT: 27 % — ABNORMAL LOW (ref 36.0–46.0)
Hemoglobin: 9.2 g/dL — ABNORMAL LOW (ref 12.0–15.0)
Lymphocytes Relative: 21 %
Lymphs Abs: 0.9 10*3/uL (ref 0.7–4.0)
MCH: 35 pg — ABNORMAL HIGH (ref 26.0–34.0)
MCHC: 34.1 g/dL (ref 30.0–36.0)
MCV: 102.7 fL — ABNORMAL HIGH (ref 78.0–100.0)
Monocytes Absolute: 0.6 10*3/uL (ref 0.1–1.0)
Monocytes Relative: 13 %
Neutro Abs: 2.3 10*3/uL (ref 1.7–7.7)
Neutrophils Relative %: 55 %
Platelets: 182 10*3/uL (ref 150–400)
RBC: 2.63 MIL/uL — ABNORMAL LOW (ref 3.87–5.11)
RDW: 15.9 % — ABNORMAL HIGH (ref 11.5–15.5)
WBC: 4.3 10*3/uL (ref 4.0–10.5)

## 2017-06-20 LAB — GLUCOSE, CAPILLARY
GLUCOSE-CAPILLARY: 155 mg/dL — AB (ref 65–99)
GLUCOSE-CAPILLARY: 106 mg/dL — AB (ref 65–99)
GLUCOSE-CAPILLARY: 93 mg/dL (ref 65–99)
Glucose-Capillary: 73 mg/dL (ref 65–99)

## 2017-06-20 LAB — RENAL FUNCTION PANEL
Albumin: 2.3 g/dL — ABNORMAL LOW (ref 3.5–5.0)
Anion gap: 9 (ref 5–15)
BUN: 18 mg/dL (ref 6–20)
CO2: 26 mmol/L (ref 22–32)
Calcium: 8.6 mg/dL — ABNORMAL LOW (ref 8.9–10.3)
Chloride: 104 mmol/L (ref 101–111)
Creatinine, Ser: 0.67 mg/dL (ref 0.44–1.00)
GFR calc Af Amer: >60 mL/min (ref >60)
GFR calc non Af Amer: >60 mL/min (ref >60)
Glucose, Bld: 90 mg/dL (ref 65–99)
Phosphorus: 3.4 mg/dL (ref 2.5–4.6)
Potassium: 4.1 mmol/L (ref 3.5–5.1)
Sodium: 139 mmol/L (ref 135–145)

## 2017-06-20 NOTE — Progress Notes (Signed)
Client was assisted OOB with max assist after attempts to get out of bed unassisted and at request this am.  Refused to wear O2 and sats 97-98% on room air while in chair and awake and tol OOB x 2 hours.

## 2017-06-20 NOTE — Progress Notes (Signed)
Nutrition Consult/Follow Up  DOCUMENTATION CODES:   Not applicable  INTERVENTION:    Continue Ensure Enlive po daily, each supplement provides 350 kcal and 20 grams of protein  NUTRITION DIAGNOSIS:   Increased nutrient needs related to acute illness as evidenced by estimated needs, ongoing  GOAL:   Patient will meet greater than or equal to 90% of their needs, met  MONITOR:   PO intake, Supplement acceptance, Labs, Skin, Weight trends  ASSESSMENT:   84 y.o. Female with PMH significant for dementia, depression, anxiety, and OSA on CPAP. History from the patient's daughter earlier today suggests that the patient likely has a dementing illness.  Patient presented with generalized weakness, lethargy, and recurrent falls.  RD consulted for calorie count. Pt is consuming approximately 45% of her meals per flowsheets. Drinking her Ensure Enlive oral nutrition supplements. Labs and medications reviewed. CBG's [id number].  Heart Healthy diet provides approximately 1850 kcals, 88 gm protein. Pt is meeting 85-95% minimum estimated energy needs; 90-100% minimum estimated protein needs.  NUTRITION - FOCUSED PHYSICAL EXAM:  Unable to complete at this time.  Diet Order:  Diet Heart Room service appropriate? Yes; Fluid consistency: Thin  EDUCATION NEEDS:   Not appropriate for education at this time  Skin:  Skin Assessment: Reviewed RN Assessment  Last BM:  2/15  Height:   Ht Readings from Last 1 Encounters:  06/14/17 '5\' 3"'  (1.6 m)   Weight:   Wt Readings from Last 1 Encounters:  06/14/17 124 lb (56.2 kg)   Ideal Body Weight:  52.2 kg  BMI:  Body mass index is 21.97 kg/m.  Estimated Nutritional Needs:   Kcal:  1200-1400  Protein:  50-65 gm  Fluid:  >/= 1.5 L  Arthur Holms, RD, LDN Pager #: [phone] After-Hours Pager #: [phone]

## 2017-06-20 NOTE — NC FL2 (Signed)
[place] LEVEL OF CARE SCREENING TOOL     IDENTIFICATION  Patient Name: Cristina Middleton VHQION Birthdate: Nov 08, 1920 Sex: female Admission Date (Current Location): 06/13/2017  [facility] and [place] Number:  Herbalist and Address:  [facility] [place], [place] [address] [place], [place] [address]      Provider Number: [id number]  Attending Physician Name and Address:  Bonnell Public, MD  Relative Name and Phone Number:  Cristina Middleton, daughter, cell [phone]     Current Level of Care: Hospital Recommended Level of Care: [place] Prior Approval Number:    Date Approved/Denied:   PASRR Number: [id number]  Discharge Plan: Other (Comment)(ALF-[facility])    Current Diagnoses: Patient Active Problem List   Diagnosis Date Noted  . CHF (congestive heart failure) ([place])   . Goals of care, counseling/discussion   . Palliative care by specialist   . SIRS (systemic inflammatory response syndrome) ([name]) 06/14/2017  . Macrocytic anemia 06/14/2017  . Multiple falls 06/14/2017  . Adjustment disorder with mixed disturbance of emotions and conduct 05/07/2017  . Vascular dementia of acute onset with behavioral disturbance 05/07/2017  . Anxiety 12/09/2016  . Hyperlipidemia 12/09/2016  . Acute pancreatitis 12/08/2016  . GIB (gastrointestinal bleeding) 06/13/2016  . Bright red blood per rectum 06/13/2016  . Influenza with respiratory manifestation 06/13/2016  . Acute respiratory failure with hypoxia ([name]) 06/13/2016  . Reactive airway disease 06/13/2016  . Varicose veins of left lower extremity with complications 56/38/7564  . SOB (shortness of breath) 06/28/2014  . Abdominal pain 06/28/2014  . Encounter for nasogastric (NG) tube placement   . SBO (small bowel obstruction) ([name]) 06/27/2014  . Diabetes mellitus without complication ([name]) 33/29/5188  . Acute blood loss anemia 05/26/2012  . HTN (hypertension)  05/25/2012  . Diabetes mellitus, type 2 ([name]) 05/25/2012  . Osteoarthritis of right knee 05/25/2012  . Sleep apnea 05/25/2012  . GERD (gastroesophageal reflux disease) 05/25/2012  . Hypothyroidism 05/25/2012    Orientation RESPIRATION BLADDER Height & Weight     Self  Normal Incontinent Weight: 124 lb (56.2 kg) Height:  5\' 3"  (160 cm)  BEHAVIORAL SYMPTOMS/MOOD NEUROLOGICAL BOWEL NUTRITION STATUS      Continent Diet(See DC Summary)  AMBULATORY STATUS COMMUNICATION OF NEEDS Skin   Limited Assist Verbally Normal                       Personal Care Assistance Level of Assistance  Dressing, Bathing, Feeding Bathing Assistance: Maximum assistance Feeding assistance: Limited assistance Dressing Assistance: Maximum assistance     Functional Limitations Info  Hearing, Sight, Speech Sight Info: Adequate Hearing Info: Adequate Speech Info: Adequate    SPECIAL CARE FACTORS FREQUENCY  PT (By licensed PT), OT (By licensed OT)     PT Frequency: 5x week OT Frequency: 5x week            Contractures      Additional Factors Info  Code Status, Allergies, Psychotropic, Isolation Precautions Code Status Info: Full Allergies Info: OTHER, PENICILLINS, SULFA ANTIBIOTICS  Psychotropic Info: Depakote, Lexapro   Isolation Precautions Info: MRSA     Current Medications (06/20/2017):  This is the current hospital active medication list Current Facility-Administered Medications  Medication Dose Route Frequency Provider Last Rate Last Dose  . 0.9 %  sodium chloride infusion  250 mL Intravenous PRN Opyd, Ilene Qua, MD 10 mL/hr at 06/16/17 0539 250 mL at 06/16/17 0539  . acetaminophen (TYLENOL) tablet  650 mg  650 mg Oral Q6H PRN Opyd, Ilene Qua, MD   650 mg at 06/20/17 0222   Or  . acetaminophen (TYLENOL) suppository 650 mg  650 mg Rectal Q6H PRN Opyd, Ilene Qua, MD      . artificial tears (LACRILUBE) ophthalmic ointment   Both Eyes BID Dana Allan I, MD      . calcium  carbonate (OS-CAL - dosed in mg of elemental calcium) tablet 500 mg of elemental calcium  500 mg of elemental calcium Oral Q breakfast Dana Allan I, MD   500 mg of elemental calcium at 06/19/17 1021  . divalproex (DEPAKOTE SPRINKLE) capsule 125 mg  125 mg Oral Q12H Dana Allan I, MD   125 mg at 06/19/17 2212  . escitalopram (LEXAPRO) tablet 10 mg  10 mg Oral Daily Opyd, Ilene Qua, MD   10 mg at 06/19/17 1021  . feeding supplement (ENSURE ENLIVE) (ENSURE ENLIVE) liquid 237 mL  237 mL Oral Q1500 Dana Allan I, MD   237 mL at 06/19/17 1621  . HYDROcodone-acetaminophen (NORCO/VICODIN) 5-325 MG per tablet 1-2 tablet  1-2 tablet Oral Q4H PRN Opyd, Ilene Qua, MD   2 tablet at 06/18/17 2210  . ketotifen (ZADITOR) 0.025 % ophthalmic solution 1 drop  1 drop Both Eyes BID Opyd, Ilene Qua, MD   1 drop at 06/19/17 2213  . levothyroxine (SYNTHROID, LEVOTHROID) tablet 100 mcg  100 mcg Oral QAC breakfast Opyd, Ilene Qua, MD   100 mcg at 06/19/17 1022  . loratadine (CLARITIN) tablet 10 mg  10 mg Oral Daily PRN Opyd, Ilene Qua, MD      . LORazepam (ATIVAN) tablet 0.5 mg  0.5 mg Oral Daily PRN Opyd, Ilene Qua, MD   0.5 mg at 06/20/17 0223  . Melatonin TABS 3 mg  3 mg Oral QHS Opyd, Ilene Qua, MD   3 mg at 06/19/17 2212  . mupirocin ointment (BACTROBAN) 2 %   Nasal BID Dana Allan I, MD      . OLANZapine [facility]) tablet 5 mg  5 mg Oral QHS Dana Allan I, MD   5 mg at 06/19/17 2212  . ondansetron (ZOFRAN) tablet 4 mg  4 mg Oral Q6H PRN Opyd, Ilene Qua, MD       Or  . ondansetron (ZOFRAN) injection 4 mg  4 mg Intravenous Q6H PRN Opyd, Ilene Qua, MD      . pantoprazole sodium (PROTONIX) 40 mg/20 mL oral suspension 40 mg  40 mg Per Tube Daily Dana Allan I, MD   40 mg at 06/19/17 1022  . polyethylene glycol (MIRALAX / GLYCOLAX) packet 17 g  17 g Oral Daily PRN Opyd, Ilene Qua, MD   17 g at 06/19/17 2239  . polyvinyl alcohol (LIQUIFILM TEARS) 1.4 % ophthalmic solution 1 drop  1 drop  Both Eyes PRN Opyd, Ilene Qua, MD      . simvastatin (ZOCOR) tablet 20 mg  20 mg Oral QHS Opyd, Ilene Qua, MD   20 mg at 06/19/17 2212  . sodium chloride flush (NS) 0.9 % injection 3 mL  3 mL Intravenous Q12H Opyd, Ilene Qua, MD   3 mL at 06/19/17 2216  . sodium chloride flush (NS) 0.9 % injection 3 mL  3 mL Intravenous PRN Opyd, Ilene Qua, MD   3 mL at 06/18/17 2220  . vitamin B-12 (CYANOCOBALAMIN) tablet 1,000 mcg  1,000 mcg Oral Daily Dana Allan I, MD   1,000 mcg at 06/19/17 1027  Discharge Medications: Please see discharge summary for a list of discharge medications.  Relevant Imaging Results:  Relevant Lab Results:   Additional Information SS#: [place], LCSW

## 2017-06-20 NOTE — Social Work (Addendum)
CSW met with daughter at bedside and discussed SNF offers. She will review list and let CSW know. Family expressed interest in [facility] for SNf placement.   CSW will f/u on same.  11:20AM: ALF staff onsite to meet with patient at bedside and discuss patient's needs with clinical nurse. CSW provided clinical PT notes for review.   CSW will f/u.  12:15pm: CSW went back to room to discuss SNF offers with daughter again. She indicated that she wanted to spake with brother before making a decision. CSW discussed the SNF bed offer from [facility].  CSW will f/u.  1:09pm CSW discussed SNF offers again and family has accepted SNF offer from [facility]. CSW advised SNF of same.  CSW will follow for disposition.  Elissa Hefty, LCSW Clinical Social Worker [phone]

## 2017-06-20 NOTE — Progress Notes (Signed)
PROGRESS NOTE    [facility]  ERD:[id number] DOB: Feb 25, 1921 DOA: 06/13/2017 PCP: Jani Gravel, MD  Outpatient Specialists:     Brief Narrative:  Cristina Middleton is a 84 y.o. Caucasian female with past medical history significant for dementia, depression, anxiety, and OSA on CPAP, hypertension, hyperlipidemia, documented diabetes mellitus without complication.  Patient resides at the memory unit of of the facility.  However, there is no documented dementia.  History from the patient's daughter earlier today suggests that the patient likely has a dementing illness.  Patient is unsteady on her feet and prone to falls at baseline.  Patient presented to the emergency room with generalized weakness, recurrent falls, worsening lethargy for couple of days prior to presentation.  Patient was said to have slipped out of her recliner on the day of admission, and was also found down on the floor of her room after an unwitnessed fall the day before.  Patient reported aches and pains after the fall, and was to have developed a mild cough.  Temperature of 38.2 C was then documented in the ED on the day of admission, with O2 sat of 87% on room air.  Chest x-ray on admission was negative for infiltrates.  CT scan of the head and cervical spine were both unremarkable.  Elevated MCV, low hemoglobin, and low platelets noted.  WBC is low normal.  Vitamin B12 and folate levels are within normal range.  I suspect patient may have myelodysplastic syndrome as well, with associated marrow suppression.  Albumin is 2.8.  Urinalysis is non-revealing.  Lactic acid is within normal range.  Pro-calcitonin is 0.10.  Patient seen mildly volume depleted on presentation.  No obvious source of infection.  06/15/2017: Patient seen alongside patient's daughter and patient's nurse.  Patient remains lethargic and sleepy.  Will review and adjust patient's medications.  Chest x-ray revealed pulmonary edema/congestive heart failure.  We will  pursue echo, and also check cardiac BNP.  Will cautiously diurese the patient.  We will continue to monitor patient's renal function and electrolytes.  Potassium is 3.3 today.  Will go ahead and replete patient's potassium.  I discussed goal of care, end-of-life issues, and possibility of consulting palliative care team with the patient's daughter.  I also communicated to the patient's daughter that the patient's condition was guarded.  Patient's daughter has promised to produce a copy of [employer] from fiiied by the patient earlier.  My suspicion is that the patient had elected to be DO NOT RESUSCITATE.  We will await the production of the document.  As documented above, patient's prognosis is guarded.  We will continue to manage the patient expectantly.    06/18/2017: Patient seen alongside patient's daughter, son-in-law and grandson.  Patient is more awake today.  Episodes of low blood sugar have been noted.  We will pursue caloric count.  Will get physical therapy consult.  06/19/2017: Patient was seen alongside patient's daughter.  According to the patient's daughter, the patient is currently hallucinating.  Will increase olanzapine to 5 mg p.o. nightly.  We will continue cautious use of mind altering medications.  Input from physical therapy is appreciated.  For skilled nursing facility placement.  Will repeat chest x-ray in the morning.  06/20/2017: Patient is more awake and interactive today.  Patient seen alongside patient's daughter.  CT scan of the chest without contrast ordered.  CT scan of the chest revealed bilateral pleural effusion, worse on the left with compressive atelectasis.  We will continue diuresis  for now.  We will proceed disposition in the next 24-48 hours to a skilled nursing facility.  Input from social worker is appreciated.  The patient has continued to improve.   Assessment & Plan:   Principal Problem: SIRS (systemic inflammatory response syndrome) (HCC) Encephalopathy,  etiology unclear.  This could be possibl metabolic. Pulmonary edema/congestive heart failure, type unknown. Failure to thrive. Hypoalbuminemia. Hypokalemia Likely undiagnosed myelodysplastic syndrome. Likely undiagnosed dementia with behavioral problems. Active Problems:   HTN (hypertension)   Diabetes mellitus, type 2 (HCC)   Sleep apnea   Hypothyroidism   Acute respiratory failure with hypoxia (HCC)   Macrocytic anemia   Multiple falls   SIRS: -On presentation, there was documented fever. -However, pro-calcitonin is within normal limits. -No obvious source of infection today. -Patient is currently on broad spectrum antibiotics empirically. -No further documented fever. -No leukocytosis, however, the patient has some form of myelosuppression. -We will continue to follow culture results. -We will complete workup. -Resolved.  Encephalopathy, likely metabolic/lethargic: -Etiology is likely multifactorial. -Patient likely has a dementing illness that may have altered sense of day and night. -Patient's daughter tells me that the patient has nights that she would stay awake and difficult to manage. -Patient is also on medications that can alter the mind. -Will review and adjust patient's medication. -Resolving. -06/19/2017: The patient is said to be hallucinating.  Will increase olanzapine to 5 mg p.o. nightly.  We will continue to monitor closely. 06/20/2017: Patient has continued to improve.  Pursue disposition and 24-48 hours.  Patient's daughter updated.  All questions answered.  Pulmonary edema/congestive heart failure of unknown type: -There is no prior documentation of congestive heart failure. -We will check cardiac BNP and echocardiogram. -Goal of care will direct further  workup. -Patient also has low albumin.  Pulmonary edema noted on the chest x-ray could be some form of capillary leak/decreased oncotic pressure. -Will start patient on IV Lasix cautiously. -We will  continue to monitor renal function and electrolytes. -Low albumin in this patient may indicate guarded prognosis. -06/17/2017: Discussed CODE STATUS extensively with patient's daughter.  The the daughter wants patient to be DNR.  Also consult the palliative care team.   -Guarded prognosis. -Chest x-ray done today, 06/17/2088 has not revealed any significant change. -Continue current management. -06/19/2017: We will check chest x-ray in the morning.  Likely pursue disposition to skilled nursing facility. 06/20/2017: Chest x-ray done today reviewed.  Continue with diuresis.  CT chest ordered without contrast.  Kindly see above.  Failure to thrive: -Patient has been having recurrent falls at the facility. -Patient is noted to be weak. -We will manage expectantly. -Albumin on presentation was 2.8.  This decreased to 2.4 earlier today. -Guarded prognosis.  Fever: -This was documented on admission in the ER.  No further fever has been documented.  However, patient is on broad-spectrum antibiotics. -Continue to monitor.   -Follow culture results. -Low threshold to pursue CT of the chest without contrast if the abnormal finding noted on today's chest x-ray does not resolve completely with diuresis.  For now, the abnormal finding is deemed to be secondary to pulmonary edema. -No further fever documented.  Hypoglycemia: -Continue to monitor blood sugar. -Pursue caloric count. -This could possibly be due to poor p.o. intake. -This is likely contributing to episodes of encephalopathy.  Hypokalemia: -Continue to monitor and replete. -Resolved. -Check magnesium level. -Magnesium was 1.8 on 06/16/2017. -06/19/2017: We will check labs in the morning. 06/20/2017: Continue to monitor and replete.  Possible  undiagnosed myeloid suppression: -MCV is elevated, patient has anemia and low platelet count. -Continue to monitor for now. -Check parvovirus B19 PCR. -Further management will depend on goal  of care. 06/20/2017: Parvovirus B19 PCR came back negative.    Likely dementia with behavioral problems: -Patient's daughter reports prior behavior problems. -Patient currently resides at the memory unit. -Patient is on oral olanzapine, and other mind altering medications. -Will adjust patient's medications accordingly. -06/20/2007:  Will increase the olanzapine. 06/20/2017: Patient seems stable on current medication.  Multiple falls: -This may be related to the patient's weakness and failure to thrive. -We will consult physical therapy team when patient improves clinically. -At the moment, the patient will be too weak to participate with physical therapy.  Acute respiratory failure, with hypoxia: -Hypoxia was noted on admission. -O2 sat done today on room air was 87%. -We will continue supplemental oxygen. -We will treat pulmonary edema/congestive heart failure with diuresis. -Further management will depend on hospital course. -06/20/2017: This has resolved significantly.  Patient refuses to wear the CPAP at nighttime.  Assess need for home oxygen prior to discharge.  Hypertension: -We will continue to optimize.  Documented diabetes mellitus: -Patient is not on any medication for diabetes mellitus at the moment. -I guess that this might be diet controlled diabetes mellitus. -Will start Accu-Cheks. -Further management will depend on hospital course. 06/20/2017: Hypoglycemic episodes have resolved.  Continue to monitor.  Overall, the patient has improved significantly.  Patient's long-term prognosis is guarded.  Patient is now DNR.  Palliative care input is appreciated.   DVT prophylaxis: SCD. Code Status: Unclear at the moment.  Family Communication: Daughter.  Disposition Plan: This will depend on hospital course.  Consult physical therapy.  Patient's daughter is asking about option of skilled nursing facility.  Currently, the patient is in the memory unit.   Consultants:    None  Procedures:   None  Antimicrobials:   Aztreonam discontinued  Levaquin is continued  Vancomycin discontinued  (Will screen for MRSA.  We will discontinue vancomycin if this comes back negative)   Subjective: No significant history from patient. Patient is more awake today.   Objective: Vitals:   06/19/17 2154 06/20/17 0500 06/20/17 1013 06/20/17 1300  BP: 138/60 136/62 (!) 122/52 (!) 131/56  Pulse: 80 81  79  Resp: 18 18 16 18   Temp: 97.9 F (36.6 C) 97.8 F (36.6 C) (!) 96.1 F (35.6 C) 98.4 F (36.9 C)  TempSrc: Oral Oral Oral Oral  SpO2: 95% 95% 98% 98%  Weight:      Height:        Intake/Output Summary (Last 24 hours) at 06/20/2017 1541 Last data filed at 06/20/2017 1437 Gross per 24 hour  Intake 510 ml  Output 1 ml  Net 509 ml   Filed Weights   06/14/17 0000  Weight: 56.2 kg (124 lb)    Examination:  General exam: Not in any distress.     Respiratory system: Decreased air entry.  Transmitted sounds.   Cardiovascular system: S1 & S2. No pedal edema. Gastrointestinal system: Abdomen is nondistended, soft and nontender. No organomegaly or masses felt. Normal bowel sounds heard. Central nervous system: Alert and oriented. No focal neurological deficits. Extremities: No leg edema.   Data Reviewed: I have personally reviewed following labs and imaging studies  CBC: Recent Labs  Lab 06/13/17 2230 06/14/17 0447 06/15/17 0810 06/20/17 0534  WBC 4.4 5.0 4.5 4.3  NEUTROABS 3.3 3.5 3.2 2.3  HGB 9.7* 9.1* 9.4* 9.2*  HCT 28.7* 27.9* 28.5* 27.0*  MCV 102.5* 102.2* 101.1* 102.7*  PLT 140* 144* 139* 517   Basic Metabolic Panel: Recent Labs  Lab 06/13/17 2230 06/14/17 0447 06/15/17 0810 06/16/17 0649 06/20/17 0534  NA 139 139 137 141 139  K 3.5 3.5 3.3* 4.6 4.1  CL 105 104 103 107 104  CO2 24 23 24 24 26   GLUCOSE 115* 80 75 88 90  BUN 20 18 14 17 18   CREATININE 0.81 0.68 0.72 0.83 0.67  CALCIUM 8.6* 8.2* 8.1* 8.3* 8.6*  MG  --    --   --  1.8  --   PHOS  --   --  3.7 3.3 3.4   GFR: Estimated Creatinine Clearance: 35.6 mL/min (by C-G formula based on SCr of 0.67 mg/dL). Liver Function Tests: Recent Labs  Lab 06/13/17 2230 06/15/17 0810 06/16/17 0649 06/20/17 0534  AST 27  --   --   --   ALT 18  --   --   --   ALKPHOS 70  --   --   --   BILITOT 0.6  --   --   --   PROT 4.9*  --   --   --   ALBUMIN 2.8* 2.4* 2.3* 2.3*   Recent Labs  Lab 06/13/17 2221  LIPASE 29   No results for input(s): AMMONIA in the last 168 hours. Coagulation Profile: No results for input(s): INR, PROTIME in the last 168 hours. Cardiac Enzymes: No results for input(s): CKTOTAL, CKMB, CKMBINDEX, TROPONINI in the last 168 hours. BNP (last 3 results) No results for input(s): PROBNP in the last 8760 hours. HbA1C: No results for input(s): HGBA1C in the last 72 hours. CBG: Recent Labs  Lab 06/19/17 1202 06/19/17 1651 06/19/17 2218 06/20/17 0645 06/20/17 1207  GLUCAP 86 107* 97 73 155*   Lipid Profile: No results for input(s): CHOL, HDL, LDLCALC, TRIG, CHOLHDL, LDLDIRECT in the last 72 hours. Thyroid Function Tests: No results for input(s): TSH, T4TOTAL, FREET4, T3FREE, THYROIDAB in the last 72 hours. Anemia Panel: No results for input(s): VITAMINB12, FOLATE, FERRITIN, TIBC, IRON, RETICCTPCT in the last 72 hours. Urine analysis:    Component Value Date/Time   COLORURINE YELLOW 06/13/2017 2356   APPEARANCEUR CLEAR 06/13/2017 2356   LABSPEC 1.025 06/13/2017 2356   PHURINE 5.0 06/13/2017 2356   GLUCOSEU NEGATIVE 06/13/2017 2356   HGBUR NEGATIVE 06/13/2017 2356   BILIRUBINUR NEGATIVE 06/13/2017 2356   KETONESUR NEGATIVE 06/13/2017 2356   PROTEINUR NEGATIVE 06/13/2017 2356   UROBILINOGEN 0.2 06/28/2014 0456   NITRITE NEGATIVE 06/13/2017 2356   LEUKOCYTESUR NEGATIVE 06/13/2017 2356   Sepsis Labs: @LABRCNTIP (procalcitonin:4,lacticidven:4)  ) Recent Results (from the past 240 hour(s))  Blood culture (routine x 2)      Status: None   Collection Time: 06/13/17 11:50 PM  Result Value Ref Range Status   Specimen Description BLOOD RIGHT ARM  Final   Special Requests   Final    BOTTLES DRAWN AEROBIC AND ANAEROBIC Blood Culture adequate volume   Culture   Final    NO GROWTH 5 DAYS Performed at [facility], [place] [address], [place], [place] [address]    Report Status 06/19/2017 FINAL  Final  Respiratory Panel by PCR     Status: None   Collection Time: 06/14/17 12:09 AM  Result Value Ref Range Status   Adenovirus NOT DETECTED NOT DETECTED Final   Coronavirus 229E NOT DETECTED NOT DETECTED Final   Coronavirus HKU1 NOT DETECTED NOT DETECTED  Final   Coronavirus NL63 NOT DETECTED NOT DETECTED Final   Coronavirus OC43 NOT DETECTED NOT DETECTED Final   Metapneumovirus NOT DETECTED NOT DETECTED Final   Rhinovirus / Enterovirus NOT DETECTED NOT DETECTED Final   Influenza A NOT DETECTED NOT DETECTED Final   Influenza A H1 NOT DETECTED NOT DETECTED Final   Influenza A H1 2009 NOT DETECTED NOT DETECTED Final   Influenza A H3 NOT DETECTED NOT DETECTED Final   Influenza B NOT DETECTED NOT DETECTED Final   Parainfluenza Virus 1 NOT DETECTED NOT DETECTED Final   Parainfluenza Virus 2 NOT DETECTED NOT DETECTED Final   Parainfluenza Virus 3 NOT DETECTED NOT DETECTED Final   Parainfluenza Virus 4 NOT DETECTED NOT DETECTED Final   Respiratory Syncytial Virus NOT DETECTED NOT DETECTED Final   Bordetella pertussis NOT DETECTED NOT DETECTED Final   Chlamydophila pneumoniae NOT DETECTED NOT DETECTED Final   Mycoplasma pneumoniae NOT DETECTED NOT DETECTED Final    Comment: Performed at [facility], [place] [address], [place], [place] [address]  Blood culture (routine x 2)     Status: None   Collection Time: 06/14/17 12:18 AM  Result Value Ref Range Status   Specimen Description BLOOD LEFT ANTECUBITAL  Final   Special Requests   Final    BOTTLES DRAWN AEROBIC AND ANAEROBIC Blood Culture adequate volume   Culture    Final    NO GROWTH 5 DAYS Performed at [facility], [place] [address] [place],  [address]    Report Status 06/19/2017 FINAL  Final  MRSA PCR Screening     Status: Abnormal   Collection Time: 06/16/17  6:43 AM  Result Value Ref Range Status   MRSA by PCR POSITIVE (A) NEGATIVE Final    Comment:        The GeneXpert MRSA Assay (FDA approved for NASAL specimens only), is one component of a comprehensive MRSA colonization surveillance program. It is not intended to diagnose MRSA infection nor to guide or monitor treatment for MRSA infections. RESULT CALLED TO, READ BACK BY AND VERIFIED WITH: Z. Calwitan RN 9:35 06/16/17 ([email or web address])          Radiology Studies: Dg Chest Port 1 View  Result Date: 06/20/2017 CLINICAL DATA:  Pulmonary edema, cough EXAM: PORTABLE CHEST 1 VIEW COMPARISON:  06/17/2017 FINDINGS: Small layering left effusion with left lower lobe consolidation. Cardiomegaly with vascular congestion and diffuse interstitial prominence, likely interstitial edema. No real change since prior study. IMPRESSION: Pulmonary edema pattern is unchanged. Left effusion with left lower lobe atelectasis or infiltrate. Electronically Signed   By: Rolm Baptise M.D.   On: 06/20/2017 08:17        Scheduled Meds: . artificial tears   Both Eyes BID  . calcium carbonate  500 mg of elemental calcium Oral Q breakfast  . divalproex  125 mg Oral Q12H  . escitalopram  10 mg Oral Daily  . feeding supplement (ENSURE ENLIVE)  237 mL Oral Q1500  . ketotifen  1 drop Both Eyes BID  . levothyroxine  100 mcg Oral QAC breakfast  . Melatonin  3 mg Oral QHS  . mupirocin ointment   Nasal BID  . OLANZapine  5 mg Oral QHS  . pantoprazole sodium  40 mg Per Tube Daily  . simvastatin  20 mg Oral QHS  . sodium chloride flush  3 mL Intravenous Q12H  . vitamin B-12  1,000 mcg Oral Daily   Continuous Infusions: . sodium chloride 250 mL (  06/16/17 0539)     LOS: 6 days    Time spent: 35  minutes.    Dana Allan, MD  [facility] Pager #: [phone] 7PM-7AM contact night coverage as above

## 2017-06-21 DIAGNOSIS — J90 Pleural effusion, not elsewhere classified: Secondary | ICD-10-CM

## 2017-06-21 DIAGNOSIS — J101 Influenza due to other identified influenza virus with other respiratory manifestations: Secondary | ICD-10-CM

## 2017-06-21 DIAGNOSIS — F0391 Unspecified dementia with behavioral disturbance: Secondary | ICD-10-CM

## 2017-06-21 LAB — GLUCOSE, CAPILLARY
GLUCOSE-CAPILLARY: 114 mg/dL — AB (ref 65–99)
Glucose-Capillary: 148 mg/dL — ABNORMAL HIGH (ref 65–99)
Glucose-Capillary: 121 mg/dL — ABNORMAL HIGH (ref 65–99)

## 2017-06-21 LAB — HUMAN PARVOVIRUS DNA DETECTION BY PCR: Parvovirus B19, PCR: NEGATIVE

## 2017-06-21 MED ORDER — PANTOPRAZOLE SODIUM 40 MG PO PACK
40.0000 mg | PACK | Freq: Every day | ORAL | Status: DC
  Administered 2017-06-22: 40 mg via ORAL
  Filled 2017-06-21: qty 20

## 2017-06-21 NOTE — Plan of Care (Signed)
  Activity: Risk for activity intolerance will decrease 06/21/2017 1146 - Progressing by Williams Che, RN   Nutrition: Adequate nutrition will be maintained 06/21/2017 1146 - Progressing by Williams Che, RN   Coping: Level of anxiety will decrease 06/21/2017 1146 - Progressing by Williams Che, RN   Elimination: Will not experience complications related to bowel motility 06/21/2017 1146 - Progressing by Williams Che, RN   Pain Managment: General experience of comfort will improve 06/21/2017 1146 - Progressing by Williams Che, RN   Safety: Ability to remain free from injury will improve 06/21/2017 1146 - Progressing by Williams Che, RN   Skin Integrity: Risk for impaired skin integrity will decrease 06/21/2017 1146 - Progressing by Williams Che, RN

## 2017-06-21 NOTE — Progress Notes (Signed)
PROGRESS NOTE    Cristina Middleton [facility]  QBH:[id number] DOB: [birth year] DOA: 06/13/2017 PCP: Jani Gravel, MD  Outpatient Specialists:     Brief Narrative:  Cristina Middleton is a 84 y.o. Caucasian female with past medical history significant for dementia, depression, anxiety, and OSA on CPAP, hypertension, hyperlipidemia, documented diabetes mellitus without complication.  Patient resides at the memory unit of of the facility. Patient is unsteady on her feet and prone to falls at baseline.  Patient presented to the emergency room with generalized weakness, recurrent falls, worsening lethargy for couple of days prior to presentation.  Patient was said to have slipped out of her recliner on the day of admission, and was also found down on the floor of her room after an unwitnessed fall the day before.  Patient reported aches and pains after the fall, and developed a mild cough. Temperature of 38.2 C was then documented in the ED on the day of admission, with O2 sat of 87% on room air.  Chest x-ray on admission was negative for infiltrates.  CT scan of the head and cervical spine were both unremarkable.  She was then treated for possible decompensated CHF.  Course complicated by hypoglycemia and hallucinations, medications were adjusted.  CT chest showed bilateral pleural effusions.    Assessment & Plan:   Principal Problem: SIRS (systemic inflammatory response syndrome) (HCC) Encephalopathy, etiology unclear.  This could be possibl metabolic. Pulmonary edema/congestive heart failure, type unknown. Failure to thrive. Hypoalbuminemia. Hypokalemia Likely undiagnosed myelodysplastic syndrome. Likely undiagnosed dementia with behavioral problems. Active Problems:   HTN (hypertension)   Diabetes mellitus, type 2 (HCC)   Sleep apnea   Hypothyroidism   Acute respiratory failure with hypoxia (HCC)   Macrocytic anemia   Multiple falls   SIRS/influenza A: -Patient was febrile on admission.  Influenza A  PCR positive.  RSV panel negative.  Parvovirus B19 PCR negative.  Urine microscopy negative.  Blood cultures x4: Negative, final report. -CT chest without contrast 2/19: Moderate right and moderate to large left dependent pleural effusions with compressive atelectasis.  No consolidations.  Low index of suspicion for empyema clinically. -Empirically started antibiotics (aztreonam, levofloxacin, vancomycin) were appropriately discontinued. -Her febrile illness on admission was likely due to influenza A.  Unfortunately she did not receive Tamiflu.  She has however defervesced and SIRS physiology has resolved.  Acute encephalopathy -Likely secondary to acute illness/influenza A and hospitalization complicating underlying dementia.  Olanzapine was increased during this admission.  Patient remains confused and intermittently mildly agitated.  No hallucinations noted today.  Suspected acute on chronic diastolic CHF -TTE 0/97: LVEF 55-60%. -She was diuresed with IV Lasix with clinical improvement.  Currently appears euvolemic.  Etiology of her pleural effusions is not known but do not feel that this is due to CHF.  Bilateral pleural effusions -Unclear etiology.  Patient appears comfortable without distress and is saturating at 98% on room air.   - I discussed with her daughter and she does not want to pursue aggressive intervention or thoracentesis at this time.  May consider follow-up chest x-ray in a couple of weeks at the nursing facility.  Adult failure to thrive Multifactorial due to advanced age, frail physical status, dementia, frequent falls and multiple medical problems.  Diet controlled DM with hypoglycemia: -She had one episode of CBG of 58 on 2/17 but none since then.  Hypokalemia: -Replaced.  Magnesium 1.8.  Macrocytic anemia -Stable.  Some question of MDS which can be followed as outpatient.  Likely no  acute intervention given advanced age and dementia.  Dementia with behavioral  abnormalities -Olanzapine increased during this admission.  Remains confused but not overtly agitated.  Multiple falls: -She remains at high risk of frequent falls due to advanced age, frail physical status and dementia.  Acute respiratory failure, with hypoxia: -Resolved.  Was likely multifactorial related to influenza A with acute respiratory illness, decompensated CHF, pleural effusions and OSA.  Patient refuses CPAP.  Hypertension: -Controlled.    DVT prophylaxis: SCD. Code Status: DNR Family Communication: I discussed in detail with patient's daughter.  Updated care and answered questions. Disposition Plan:  Palliative care input 2/17: Discharge back to SNF or memory care unit and follow-up by palliative care.  DC to SNF 2/21.   Consultants:   Palliative care team  Procedures:   None  Antimicrobials:   All discontinued.   Subjective: Pleasantly confused.  Oriented to self only.  Denies complaints and wants to go home.  As per nursing, awake most of the night, agitated intermittently and did not sleep well.   Objective: Vitals:   06/20/17 1300 06/20/17 2259 06/21/17 0515 06/21/17 1500  BP: (!) 131/56 (!) 141/67 136/68 (!) 133/35  Pulse: 79 93 85 88  Resp: 18 16 16 18   Temp: 98.4 F (36.9 C) 99.4 F (37.4 C) 99 F (37.2 C) 99 F (37.2 C)  TempSrc: Oral Axillary Axillary Axillary  SpO2: 98% 97% 98% 98%  Weight:      Height:        Intake/Output Summary (Last 24 hours) at 06/21/2017 1604 Last data filed at 06/21/2017 1500 Gross per 24 hour  Intake 480 ml  Output -  Net 480 ml   Filed Weights   06/14/17 0000  Weight: 56.2 kg (124 lb)    Examination:  General exam: Pleasant elderly female, small built and frail, lying comfortably supine in bed. Respiratory system: Reduced breath sounds in the bases but no wheezing, rhonchi or crackles.  Rest of lung fields clear to auscultation.  No increased work of breathing. Cardiovascular system: S1 and S2 heard,  RRR.  No JVD, murmurs or pedal edema. Gastrointestinal system: Abdomen is nondistended, soft and nontender. No organomegaly or masses felt. Normal bowel sounds heard.  Stable without change. Central nervous system: Alert and oriented only to self. No focal neurological deficits. Extremities: Moves all limbs symmetrically. Psychiatric: Pleasant and confused.  Not agitated.   Data Reviewed: I have personally reviewed following labs and imaging studies  CBC: Recent Labs  Lab 06/15/17 0810 06/20/17 0534  WBC 4.5 4.3  NEUTROABS 3.2 2.3  HGB 9.4* 9.2*  HCT 28.5* 27.0*  MCV 101.1* 102.7*  PLT 139* 938   Basic Metabolic Panel: Recent Labs  Lab 06/15/17 0810 06/16/17 0649 06/20/17 0534  NA 137 141 139  K 3.3* 4.6 4.1  CL 103 107 104  CO2 24 24 26   GLUCOSE 75 88 90  BUN 14 17 18   CREATININE 0.72 0.83 0.67  CALCIUM 8.1* 8.3* 8.6*  MG  --  1.8  --   PHOS 3.7 3.3 3.4   Liver Function Tests: Recent Labs  Lab 06/15/17 0810 06/16/17 0649 06/20/17 0534  ALBUMIN 2.4* 2.3* 2.3*   CBG: Recent Labs  Lab 06/20/17 1207 06/20/17 1627 06/20/17 2209 06/21/17 0745 06/21/17 1150  GLUCAP 155* 106* 93 114* 148*   Urine analysis:    Component Value Date/Time   COLORURINE YELLOW 06/13/2017 2356   APPEARANCEUR CLEAR 06/13/2017 2356   LABSPEC 1.025 06/13/2017 2356  PHURINE 5.0 06/13/2017 2356   GLUCOSEU NEGATIVE 06/13/2017 2356   HGBUR NEGATIVE 06/13/2017 2356   BILIRUBINUR NEGATIVE 06/13/2017 2356   KETONESUR NEGATIVE 06/13/2017 2356   PROTEINUR NEGATIVE 06/13/2017 2356   UROBILINOGEN 0.2 06/28/2014 0456   NITRITE NEGATIVE 06/13/2017 2356   LEUKOCYTESUR NEGATIVE 06/13/2017 2356    Recent Results (from the past 240 hour(s))  Blood culture (routine x 2)     Status: None   Collection Time: 06/13/17 11:50 PM  Result Value Ref Range Status   Specimen Description BLOOD RIGHT ARM  Final   Special Requests   Final    BOTTLES DRAWN AEROBIC AND ANAEROBIC Blood Culture adequate  volume   Culture   Final    NO GROWTH 5 DAYS Performed at [facility], [place] [address] [place], [place] [address]    Report Status 06/19/2017 FINAL  Final  Respiratory Panel by PCR     Status: None   Collection Time: 06/14/17 12:09 AM  Result Value Ref Range Status   Adenovirus NOT DETECTED NOT DETECTED Final   Coronavirus 229E NOT DETECTED NOT DETECTED Final   Coronavirus HKU1 NOT DETECTED NOT DETECTED Final   Coronavirus NL63 NOT DETECTED NOT DETECTED Final   Coronavirus OC43 NOT DETECTED NOT DETECTED Final   Metapneumovirus NOT DETECTED NOT DETECTED Final   Rhinovirus / Enterovirus NOT DETECTED NOT DETECTED Final   Influenza A NOT DETECTED NOT DETECTED Final   Influenza A H1 NOT DETECTED NOT DETECTED Final   Influenza A H1 2009 NOT DETECTED NOT DETECTED Final   Influenza A H3 NOT DETECTED NOT DETECTED Final   Influenza B NOT DETECTED NOT DETECTED Final   Parainfluenza Virus 1 NOT DETECTED NOT DETECTED Final   Parainfluenza Virus 2 NOT DETECTED NOT DETECTED Final   Parainfluenza Virus 3 NOT DETECTED NOT DETECTED Final   Parainfluenza Virus 4 NOT DETECTED NOT DETECTED Final   Respiratory Syncytial Virus NOT DETECTED NOT DETECTED Final   Bordetella pertussis NOT DETECTED NOT DETECTED Final   Chlamydophila pneumoniae NOT DETECTED NOT DETECTED Final   Mycoplasma pneumoniae NOT DETECTED NOT DETECTED Final    Comment: Performed at [facility], [place] [address], [place], [place] [address]  Blood culture (routine x 2)     Status: None   Collection Time: 06/14/17 12:18 AM  Result Value Ref Range Status   Specimen Description BLOOD LEFT ANTECUBITAL  Final   Special Requests   Final    BOTTLES DRAWN AEROBIC AND ANAEROBIC Blood Culture adequate volume   Culture   Final    NO GROWTH 5 DAYS Performed at [facility], [place] [address], [place], [place] [address]    Report Status 06/19/2017 FINAL  Final  MRSA PCR Screening     Status: Abnormal   Collection Time: 06/16/17   6:43 AM  Result Value Ref Range Status   MRSA by PCR POSITIVE (A) NEGATIVE Final    Comment:        The GeneXpert MRSA Assay (FDA approved for NASAL specimens only), is one component of a comprehensive MRSA colonization surveillance program. It is not intended to diagnose MRSA infection nor to guide or monitor treatment for MRSA infections. RESULT CALLED TO, READ BACK BY AND VERIFIED WITH: Z. Calwitan RN 9:35 06/16/17 ([email or web address])          Radiology Studies: Ct Chest Wo Contrast  Result Date: 06/20/2017 CLINICAL DATA:  Unresolved pneumonia EXAM: CT CHEST WITHOUT CONTRAST TECHNIQUE: Multidetector CT imaging of the  chest was performed following the standard protocol without IV contrast. COMPARISON:  06/20/2017 FINDINGS: Cardiovascular: Cardiomegaly with moderate thoracic aortic atherosclerosis and ectasia. No significant pericardial effusion or thickening. Three-vessel coronary arteriosclerosis is noted. Moderate atherosclerosis at the origins of the great vessels. Normal branch pattern of the great vessels. Mediastinum/Nodes: No thyromegaly or mass. Patent trachea and mainstem bronchi. Small nonpathologic size paratracheal lymph nodes. No axillary adenopathy. Large hiatal hernia containing much of the stomach. Lungs/Pleura: Bilateral dependent moderate right and moderate to large left pleural effusions with compressive atelectasis. Respiratory motion artifacts limit assessment of the lungs. No focal pneumonic consolidation. No pneumothorax. Upper Abdomen: A few scattered calcifications are seen within the liver compatible with old granulomatous disease. No splenomegaly. No acute upper abdominal abnormality. Musculoskeletal: Thoracolumbar spondylosis with multilevel degenerative disc disease. Kyphosis at the thoracolumbar junction attributable to moderate disc disease and physiologic anterior wedging of vertebrae. IMPRESSION: 1. Moderate right and moderate to large left dependent pleural effusions  with compressive atelectasis. No acute pneumonic consolidations given limitations of respiratory motion artifacts. 2. Three-vessel coronary arteriosclerosis with moderate thoracic aortic atherosclerosis. Atherosclerosis of the great vessels with normal branch pattern off the arch. 3. Large hiatal hernia containing much of the stomach. Aortic Atherosclerosis (ICD10-I70.0). Electronically Signed   By: Ashley Royalty M.D.   On: 06/20/2017 15:50   Dg Chest Port 1 View  Result Date: 06/20/2017 CLINICAL DATA:  Pulmonary edema, cough EXAM: PORTABLE CHEST 1 VIEW COMPARISON:  06/17/2017 FINDINGS: Small layering left effusion with left lower lobe consolidation. Cardiomegaly with vascular congestion and diffuse interstitial prominence, likely interstitial edema. No real change since prior study. IMPRESSION: Pulmonary edema pattern is unchanged. Left effusion with left lower lobe atelectasis or infiltrate. Electronically Signed   By: Rolm Baptise M.D.   On: 06/20/2017 08:17        Scheduled Meds: . artificial tears   Both Eyes BID  . calcium carbonate  500 mg of elemental calcium Oral Q breakfast  . divalproex  125 mg Oral Q12H  . escitalopram  10 mg Oral Daily  . feeding supplement (ENSURE ENLIVE)  237 mL Oral Q1500  . ketotifen  1 drop Both Eyes BID  . levothyroxine  100 mcg Oral QAC breakfast  . Melatonin  3 mg Oral QHS  . OLANZapine  5 mg Oral QHS  . pantoprazole sodium  40 mg Per Tube Daily  . simvastatin  20 mg Oral QHS  . sodium chloride flush  3 mL Intravenous Q12H  . vitamin B-12  1,000 mcg Oral Daily   Continuous Infusions: . sodium chloride 250 mL (06/16/17 0539)     LOS: 7 days    Time spent: 35 minutes.  Vernell Leep, MD, FACP, [facility] Pager [phone]  If 7PM-7AM, please contact night-coverage [email or web address] Password [facility] 06/21/2017, 4:23 PM

## 2017-06-21 NOTE — Progress Notes (Signed)
Physical Therapy Treatment Patient Details Name: Cristina Middleton MRN: [id number] DOB: [birth year] Today's Date: 06/21/2017    History of Present Illness 84 yo female with onset of SIRS with pulm edema was admitted from memory unit in ALF.  Has been having falls, lethargic, coughing.  O2 sats have been low, has been having metabolic encephalopathy, medications may be to blame.  FTT, has pulm edema, CHF, low BS, acute respiratory failure with hypoxia, possible myeloid suppression, low K+.  PMH:  DM, HTN, falls    PT Comments    [name] present during session and helpful. She stated her goal is to have her mom walking again, dressing her self, and eating w/o assist. Pt ambulated with min A for RW management and chair close behind. Plan to continue gait training with RW. This is a new AD for pt, as she was using quad cane previously, however requires additional support secondary to falls. Pt would benefit from continued skilled PT to increase functional independence and safety with mobility. Expected to d/c this PM to SNF for rehab.   Follow Up Recommendations  SNF     Equipment Recommendations  Rolling walker with 5" wheels    Recommendations for Other Services       Precautions / Restrictions Precautions Precautions: Fall(check O2 sats, using O2) Restrictions Weight Bearing Restrictions: No    Mobility  Bed Mobility               General bed mobility comments: in chair on arrival  Transfers Overall transfer level: Needs assistance Equipment used: Rolling walker (2 wheeled) Transfers: Sit to/from Stand Sit to Stand: Min assist         General transfer comment: Min A to power up multimodal cues for hand placement.  Ambulation/Gait Ambulation/Gait assistance: Min assist Ambulation Distance (Feet): 12 Feet Assistive device: Rolling walker (2 wheeled);1 person hand held assist Gait Pattern/deviations: Step-to pattern;Step-through pattern;Shuffle;Decreased stride  length;Wide base of support;Trunk flexed Gait velocity: reduced Gait velocity interpretation: Below normal speed for age/gender General Gait Details: Pt with shuffling steps using RW. Required assist for RW management. Therapist in front of pt with hands on RW providing cues, family member with chair close behind.  Pt fatigued after 12 ft requiring seated rest break.   Stairs            Wheelchair Mobility    Modified Rankin (Stroke Patients Only)       Balance Overall balance assessment: History of Falls;Needs assistance Sitting-balance support: Feet supported Sitting balance-Leahy Scale: Fair     Standing balance support: Bilateral upper extremity supported;During functional activity Standing balance-Leahy Scale: Poor                              Cognition Arousal/Alertness: Lethargic Behavior During Therapy: Flat affect Overall Cognitive Status: History of cognitive impairments - at baseline                                 General Comments: Pt able to follow simple commands well.      Exercises      General Comments General comments (skin integrity, edema, etc.): O2 at 95% prior to ambulation on RA.      Pertinent Vitals/Pain Pain Assessment: Faces Faces Pain Scale: No hurt    Home Living  Prior Function            PT Goals (current goals can now be found in the care plan section) Acute Rehab PT Goals Patient Stated Goal: Daughter states "I want her walking again" PT Goal Formulation: With patient/family Time For Goal Achievement: 07/03/17 Potential to Achieve Goals: Good Progress towards PT goals: Progressing toward goals    Frequency    Min 2X/week      PT Plan Current plan remains appropriate    Co-evaluation              AM-PAC PT "6 Clicks" Daily Activity  Outcome Measure  Difficulty turning over in bed (including adjusting bedclothes, sheets and blankets)?:  Unable Difficulty moving from lying on back to sitting on the side of the bed? : Unable Difficulty sitting down on and standing up from a chair with arms (e.g., wheelchair, bedside commode, etc,.)?: Unable Help needed moving to and from a bed to chair (including a wheelchair)?: A Little Help needed walking in hospital room?: A Little Help needed climbing 3-5 steps with a railing? : Total 6 Click Score: 10    End of Session Equipment Utilized During Treatment: Gait belt Activity Tolerance: Patient tolerated treatment well;Patient limited by fatigue Patient left: in chair;with call bell/phone within reach;with chair alarm set;with family/visitor present Nurse Communication: Mobility status PT Visit Diagnosis: Unsteadiness on feet (R26.81);Muscle weakness (generalized) (M62.81);Adult, failure to thrive (R62.7)     Time: 7564-3329 PT Time Calculation (min) (ACUTE ONLY): 18 min  Charges:  $Gait Training: 8-22 mins                    G Codes:       Benjiman Core, [place] Pager [phone] Acute Rehab   Allena Katz 06/21/2017, 11:58 AM

## 2017-06-22 DIAGNOSIS — E119 Type 2 diabetes mellitus without complications: Secondary | ICD-10-CM | POA: Diagnosis not present

## 2017-06-22 DIAGNOSIS — I509 Heart failure, unspecified: Secondary | ICD-10-CM | POA: Diagnosis not present

## 2017-06-22 DIAGNOSIS — R4182 Altered mental status, unspecified: Secondary | ICD-10-CM

## 2017-06-22 DIAGNOSIS — J101 Influenza due to other identified influenza virus with other respiratory manifestations: Secondary | ICD-10-CM | POA: Diagnosis not present

## 2017-06-22 DIAGNOSIS — W19XXXA Unspecified fall, initial encounter: Secondary | ICD-10-CM

## 2017-06-22 DIAGNOSIS — J9601 Acute respiratory failure with hypoxia: Secondary | ICD-10-CM | POA: Diagnosis not present

## 2017-06-22 DIAGNOSIS — G4730 Sleep apnea, unspecified: Secondary | ICD-10-CM | POA: Diagnosis not present

## 2017-06-22 DIAGNOSIS — R278 Other lack of coordination: Secondary | ICD-10-CM | POA: Diagnosis not present

## 2017-06-22 DIAGNOSIS — F0390 Unspecified dementia without behavioral disturbance: Secondary | ICD-10-CM | POA: Diagnosis not present

## 2017-06-22 DIAGNOSIS — R296 Repeated falls: Secondary | ICD-10-CM | POA: Diagnosis not present

## 2017-06-22 DIAGNOSIS — D649 Anemia, unspecified: Secondary | ICD-10-CM | POA: Diagnosis not present

## 2017-06-22 DIAGNOSIS — J918 Pleural effusion in other conditions classified elsewhere: Secondary | ICD-10-CM | POA: Diagnosis not present

## 2017-06-22 DIAGNOSIS — I10 Essential (primary) hypertension: Secondary | ICD-10-CM

## 2017-06-22 DIAGNOSIS — R2681 Unsteadiness on feet: Secondary | ICD-10-CM | POA: Diagnosis not present

## 2017-06-22 DIAGNOSIS — M6281 Muscle weakness (generalized): Secondary | ICD-10-CM | POA: Diagnosis not present

## 2017-06-22 DIAGNOSIS — R531 Weakness: Secondary | ICD-10-CM | POA: Diagnosis not present

## 2017-06-22 DIAGNOSIS — J811 Chronic pulmonary edema: Secondary | ICD-10-CM | POA: Diagnosis not present

## 2017-06-22 DIAGNOSIS — R509 Fever, unspecified: Secondary | ICD-10-CM

## 2017-06-22 DIAGNOSIS — R1312 Dysphagia, oropharyngeal phase: Secondary | ICD-10-CM | POA: Diagnosis not present

## 2017-06-22 DIAGNOSIS — J90 Pleural effusion, not elsewhere classified: Secondary | ICD-10-CM | POA: Diagnosis not present

## 2017-06-22 DIAGNOSIS — J810 Acute pulmonary edema: Secondary | ICD-10-CM | POA: Diagnosis not present

## 2017-06-22 DIAGNOSIS — R6510 Systemic inflammatory response syndrome (SIRS) of non-infectious origin without acute organ dysfunction: Secondary | ICD-10-CM | POA: Diagnosis not present

## 2017-06-22 LAB — GLUCOSE, CAPILLARY
GLUCOSE-CAPILLARY: 133 mg/dL — AB (ref 65–99)
Glucose-Capillary: 124 mg/dL — ABNORMAL HIGH (ref 65–99)

## 2017-06-22 MED ORDER — ENSURE ENLIVE PO LIQD: 237.0000 mL | Freq: Every day | ORAL | Status: DC

## 2017-06-22 MED ORDER — LORAZEPAM 0.5 MG PO TABS: 0.5000 mg | ORAL_TABLET | Freq: Every day | ORAL | 0 refills | Status: AC | PRN

## 2017-06-22 MED ORDER — LANSOPRAZOLE 3 MG/ML SUSP: 30.0000 mg | Freq: Every day | ORAL | Status: DC

## 2017-06-22 NOTE — Social Work (Signed)
Clinical Social Worker facilitated patient discharge including contacting patient family and facility to confirm patient discharge plans.  Clinical information faxed to facility and family agreeable with plan.   CSW arranged ambulance transport via [facility] to [facility].    RN to call [phone] to give report prior to discharge. Pt going to Room 101A.  Clinical Social Worker will sign off for now as social work intervention is no longer needed. Please consult [place] again if new need arises.  Elissa Hefty, LCSW Clinical Social Worker [phone]

## 2017-06-22 NOTE — Discharge Summary (Signed)
Physician Discharge Summary  Cristina Middleton [facility] JJK:[id number] DOB: 1921-03-25  PCP: Jani Gravel, MD  Admit date: 06/13/2017 Discharge date: 06/22/2017  Recommendations for Outpatient Follow-up:  1. MD at SNF in 5 days with repeat labs (CBC & BMP). 2. Consider repeating chest x-ray in 1-2 weeks to follow-up on bilateral pleural effusions and as needed if symptomatic. 3. Recommend palliative care consultation and follow-up at SNF. 4. Dr. Jani Gravel, PCP upon discharge from SNF.  Home Health: None Equipment/Devices: None  Discharge Condition: Improved and stable CODE STATUS: DNR Diet recommendation: Heart healthy diet.  Discharge Diagnoses:  Principal Problem:   SIRS (systemic inflammatory response syndrome) (HCC) Active Problems:   HTN (hypertension)   Diabetes mellitus, type 2 (HCC)   Sleep apnea   Hypothyroidism   Acute respiratory failure with hypoxia (HCC)   Macrocytic anemia   Multiple falls   CHF (congestive heart failure) ([place])   Goals of care, counseling/discussion   Palliative care by specialist   Brief Summary: Cristina Middleton a 84 y.o. Caucasian femalewith past medical history significant fordementia, depression, anxiety, and OSA on CPAP, hypertension, hyperlipidemia, documented diabetes mellitus without complication.  Patient resides at a memory unit facility. Patient is unsteady on her feet and prone to falls at baseline.  Patient presented to the emergency room with generalized weakness, recurrent falls, worsening lethargy for couple of days prior to presentation.  Patient was said to have slipped out of her recliner on the day of admission, and was also found down on the floor of her room after an unwitnessed fall the day before.  Patient reported aches and pains after the fall, and developed a mild cough. Temperature of 38.2 C was then documented in the ED on the day of admission, with O2 sat of 87% on room air.  Chest x-ray on admission was negative for infiltrates.   CT scan of the head and cervical spine were both unremarkable.  She was then treated for possible decompensated CHF.  Course complicated by hypoglycemia and hallucinations, medications were adjusted.  CT chest showed bilateral pleural effusions.    Assessment & Plan:   SIRS/influenza A: -Patient was febrile on admission.  Influenza A PCR positive.  RSV panel negative.  Parvovirus B19 PCR negative.  Urine microscopy negative.  Blood cultures x4: Negative, final report. -CT chest without contrast 2/19: Moderate right and moderate to large left dependent pleural effusions with compressive atelectasis.  No consolidations.  Low index of suspicion for empyema clinically. -Empirically started antibiotics (aztreonam, levofloxacin, vancomycin) were appropriately discontinued. -Her febrile illness on admission was likely due to influenza A.  Unfortunately she did not receive Tamiflu.  She has however defervesced and SIRS physiology has resolved. -Stable.  Acute encephalopathy -Likely secondary to acute illness/influenza A and hospitalization complicating underlying dementia with behavioral abnormalities.   -Patient was on several medications PTA (Depakote, Lexapro, trazodone, olanzapine).  Depakote and olanzapine were continued in the hospital at lower dose.  Trazodone was not given in the hospital.  This was probably contributing to lack of control of her behavioral abnormalities.  At discharge, patient to return to prior doses of these medications with close outpatient follow-up.  Suspected acute on chronic diastolic CHF -TTE 3/71: LVEF 55-60%. -She was diuresed with IV Lasix with clinical improvement.  Currently appears euvolemic.  Etiology of her pleural effusions is not known but do not feel that this is due to CHF. -May consider low-dose Lasix at SNF during close follow-up if noted to have volume overload.  Bilateral pleural effusions -Unclear etiology.  Patient appears comfortable without  distress and is saturating at 98% on room air.   - I discussed with her daughter on 06/21/17 and she does not want to pursue aggressive intervention or thoracentesis at this time.  May consider follow-up chest x-ray in a couple of weeks at the nursing facility.  Adult failure to thrive Multifactorial due to advanced age, frail physical status, dementia, frequent falls and multiple medical problems.  Diet controlled DM with hypoglycemia: -She had one episode of CBG of 58 on 2/17 but none since then.  May consider monitoring periodically if felt necessary at SNF.  Hypokalemia: -Replaced.  Magnesium 1.8.  Macrocytic anemia -Stable.  Some question of MDS which can be followed as outpatient.  Likely no acute intervention given advanced age and dementia.  Dementia with behavioral abnormalities -Please see discussion above.  Pleasantly confused.  Impulsive and attempts to get out of bed but not agitated.  Multiple falls: -She remains at high risk of frequent falls due to advanced age, frail physical status and dementia.  Acute respiratory failure, with hypoxia: -Resolved.  Was likely multifactorial related to influenza A with acute respiratory illness, decompensated CHF, pleural effusions and OSA.  Patient refuses CPAP.  Hypertension: -Controlled.    Consultants:   Palliative care team  Procedures:   None   Discharge Instructions  Discharge Instructions    (HEART FAILURE PATIENTS) Call MD:  Anytime you have any of the following symptoms: 1) 3 pound weight gain in 24 hours or 5 pounds in 1 week 2) shortness of breath, with or without a dry hacking cough 3) swelling in the hands, feet or stomach 4) if you have to sleep on extra pillows at night in order to breathe.   Complete by:  As directed    Call MD for:  difficulty breathing, headache or visual disturbances   Complete by:  As directed    Call MD for:  extreme fatigue   Complete by:  As directed    Call MD for:   persistant dizziness or light-headedness   Complete by:  As directed    Call MD for:  temperature >100.4   Complete by:  As directed    Diet - low sodium heart healthy   Complete by:  As directed    Increase activity slowly   Complete by:  As directed        Medication List    TAKE these medications   acetaminophen 325 MG tablet Commonly known as:  TYLENOL Take 650 mg by mouth at bedtime.   aspirin EC 81 MG tablet Take 81 mg by mouth daily.   calcium carbonate 1500 (600 Ca) MG Tabs tablet Commonly known as:  OSCAL Take 600 mg of elemental calcium by mouth daily.   divalproex 125 MG capsule Commonly known as:  DEPAKOTE SPRINKLE Take 125 mg by mouth 3 (three) times daily.   escitalopram 10 MG tablet Commonly known as:  LEXAPRO Take 10 mg by mouth daily.   feeding supplement (ENSURE ENLIVE) Liqd Take 237 mLs by mouth daily at 3 pm.   ketotifen 0.025 % ophthalmic solution Commonly known as:  ZADITOR Place 1 drop into both eyes 2 (two) times daily.   lansoprazole 3 mg/ml Susp oral suspension Commonly known as:  PREVACID Take 10 mLs (30 mg total) by mouth daily at 12 noon. What changed:  how to take this   levothyroxine 100 MCG tablet Commonly known as:  SYNTHROID, LEVOTHROID Take  100 mcg by mouth daily before breakfast.   loratadine 10 MG tablet Commonly known as:  CLARITIN Take 10 mg by mouth daily as needed for allergies.   LORazepam 0.5 MG tablet Commonly known as:  ATIVAN Take 1 tablet (0.5 mg total) by mouth daily as needed for anxiety. What changed:  reasons to take this   Melatonin 3 MG Tabs Take 3 mg by mouth at bedtime.   OLANZapine 5 MG tablet Commonly known as:  ZYPREXA Take 5 mg by mouth 2 (two) times daily.   polyethylene glycol packet Commonly known as:  MIRALAX / GLYCOLAX Take 17 g by mouth daily as needed for moderate constipation.   polyvinyl alcohol 1.4 % ophthalmic solution Commonly known as:  LIQUIFILM TEARS Place 1 drop into both  eyes as needed for dry eyes.   simvastatin 20 MG tablet Commonly known as:  ZOCOR Take 20 mg by mouth at bedtime.   traZODone 100 MG tablet Commonly known as:  DESYREL Take 100 mg by mouth at bedtime.   vitamin B-12 500 MCG tablet Commonly known as:  CYANOCOBALAMIN Take 1,000 mcg by mouth daily.   Wh Petrol-Mineral Oil-Lanolin 0.1-0.1 % Oint Place 1 application into both eyes 2 (two) times daily.       Contact information for follow-up providers    Jani Gravel, MD. Schedule an appointment as soon as possible for a visit.   Specialty:  Internal Medicine Why:  Upon discharge from SNF. Contact information: [address] [place] [address] [phone]        MD at SNF. Schedule an appointment as soon as possible for a visit in 5 day(s).   Why:  To be seen with repeat labs (CBC & BMP).  Consider repeating chest x-ray in 1-2 weeks to follow-up on bilateral pleural effusions and as needed if symptomatic.           Contact information for after-discharge care    Destination    [facility] Follow up.   Service:  Skilled Nursing Contact information: [place] [phone]                 Allergies  Allergen Reactions  . Other Other (See Comments)    Pt is allergic to Alka-Seltzer tablets.   Reaction:  Hallucinations   . Penicillins Hives, Itching, Swelling and Other (See Comments)    Reaction:  Unspecified swelling reaction Has patient had a PCN reaction causing immediate rash, facial/tongue/throat swelling, SOB or lightheadedness with hypotension: Yes Has patient had a PCN reaction causing severe rash involving mucus membranes or skin necrosis: No Has patient had a PCN reaction that required hospitalization No Has patient had a PCN reaction occurring within the last 10 years: No If all of the above answers are "NO", then may proceed with Cephalosporin use.  . Sulfa  Antibiotics Hives, Itching, Swelling and Other (See Comments)    Reaction:  Unspecified swelling reaction      Procedures/Studies: Dg Chest 1 View  Result Date: 06/13/2017 CLINICAL DATA:  Fall from recliner.  Pain. EXAM: CHEST 1 VIEW COMPARISON:  05/07/2017, additional priors. FINDINGS: Lower lung volumes from prior. Unchanged heart size allowing for differences in technique. Retrocardiac opacity consistent with hiatal hernia, limiting left lung base. Bronchovascular crowding with mild bronchial cuffing. No confluent airspace disease. No pneumothorax. No large pleural effusion, limited assessment due to technique. No grossly displaced rib fracture. IMPRESSION: 1. Bronchovascular crowding with peribronchial cuffing,  may be due to lower lung volumes or bronchial inflammation. 2. Large hiatal hernia. Electronically Signed   By: Jeb Levering M.D.   On: 06/13/2017 23:50   Ct Head Wo Contrast  Result Date: 06/13/2017 CLINICAL DATA:  Patient fell from recliner.  Dementia baseline. EXAM: CT HEAD WITHOUT CONTRAST CT CERVICAL SPINE WITHOUT CONTRAST TECHNIQUE: Multidetector CT imaging of the head and cervical spine was performed following the standard protocol without intravenous contrast. Multiplanar CT image reconstructions of the cervical spine were also generated. COMPARISON:  05/06/2017 FINDINGS: CT HEAD FINDINGS Brain: Redemonstration of cerebral atrophy with sulcal and ventricular prominence. Moderate chronic periventricular and subcortical white matter microvascular ischemic change is noted without large vascular territory infarct, intra-axial mass nor extra-axial fluid collections. No intracranial hemorrhage. Vascular: No hyperdense vessel or unexpected calcification. Mild-to-moderate atherosclerosis of the carotid siphons. Skull: No acute nor suspicious osseous abnormality. Sinuses/Orbits: No acute finding. Other: None CT CERVICAL SPINE FINDINGS Alignment: Maintained cervical lordosis. Intact  craniocervical relationship. Osteoarthritic joint space narrowing spurring about the atlantodental. Skull base and vertebrae: No acute fracture. No primary bone lesion or focal pathologic process. Soft tissues and spinal canal: No prevertebral fluid or swelling. No visible canal hematoma. Disc levels: Moderate to marked disc space narrowing along the entirety of the cervical spine with small posterior marginal osteophytes and uncinate spurring secondary to uncovertebral joint osteoarthritis. These contribute to multilevel bilateral neural foraminal encroachment. No jumped or perched facets. Mild grade 1 anterolisthesis of C6 on C7. Upper chest: No pneumonic consolidations. Limited assessment due to patient respiratory motion. Other: Moderate atherosclerosis of the extracranial carotid arteries bilaterally. IMPRESSION: 1. No acute intracranial abnormality. 2. Atrophy with chronic microvascular ischemic disease. 3. Cervical spondylosis without acute posttraumatic cervical spine fracture. Electronically Signed   By: Ashley Royalty M.D.   On: 06/13/2017 23:16   Ct Chest Wo Contrast  Result Date: 06/20/2017 CLINICAL DATA:  Unresolved pneumonia EXAM: CT CHEST WITHOUT CONTRAST TECHNIQUE: Multidetector CT imaging of the chest was performed following the standard protocol without IV contrast. COMPARISON:  06/20/2017 FINDINGS: Cardiovascular: Cardiomegaly with moderate thoracic aortic atherosclerosis and ectasia. No significant pericardial effusion or thickening. Three-vessel coronary arteriosclerosis is noted. Moderate atherosclerosis at the origins of the great vessels. Normal branch pattern of the great vessels. Mediastinum/Nodes: No thyromegaly or mass. Patent trachea and mainstem bronchi. Small nonpathologic size paratracheal lymph nodes. No axillary adenopathy. Large hiatal hernia containing much of the stomach. Lungs/Pleura: Bilateral dependent moderate right and moderate to large left pleural effusions with  compressive atelectasis. Respiratory motion artifacts limit assessment of the lungs. No focal pneumonic consolidation. No pneumothorax. Upper Abdomen: A few scattered calcifications are seen within the liver compatible with old granulomatous disease. No splenomegaly. No acute upper abdominal abnormality. Musculoskeletal: Thoracolumbar spondylosis with multilevel degenerative disc disease. Kyphosis at the thoracolumbar junction attributable to moderate disc disease and physiologic anterior wedging of vertebrae. IMPRESSION: 1. Moderate right and moderate to large left dependent pleural effusions with compressive atelectasis. No acute pneumonic consolidations given limitations of respiratory motion artifacts. 2. Three-vessel coronary arteriosclerosis with moderate thoracic aortic atherosclerosis. Atherosclerosis of the great vessels with normal branch pattern off the arch. 3. Large hiatal hernia containing much of the stomach. Aortic Atherosclerosis (ICD10-I70.0). Electronically Signed   By: Ashley Royalty M.D.   On: 06/20/2017 15:50   Ct Cervical Spine Wo Contrast  Result Date: 06/13/2017 CLINICAL DATA:  Patient fell from recliner.  Dementia baseline. EXAM: CT HEAD WITHOUT CONTRAST CT CERVICAL SPINE WITHOUT CONTRAST TECHNIQUE: Multidetector CT imaging of  the head and cervical spine was performed following the standard protocol without intravenous contrast. Multiplanar CT image reconstructions of the cervical spine were also generated. COMPARISON:  05/06/2017 FINDINGS: CT HEAD FINDINGS Brain: Redemonstration of cerebral atrophy with sulcal and ventricular prominence. Moderate chronic periventricular and subcortical white matter microvascular ischemic change is noted without large vascular territory infarct, intra-axial mass nor extra-axial fluid collections. No intracranial hemorrhage. Vascular: No hyperdense vessel or unexpected calcification. Mild-to-moderate atherosclerosis of the carotid siphons. Skull: No acute  nor suspicious osseous abnormality. Sinuses/Orbits: No acute finding. Other: None CT CERVICAL SPINE FINDINGS Alignment: Maintained cervical lordosis. Intact craniocervical relationship. Osteoarthritic joint space narrowing spurring about the atlantodental. Skull base and vertebrae: No acute fracture. No primary bone lesion or focal pathologic process. Soft tissues and spinal canal: No prevertebral fluid or swelling. No visible canal hematoma. Disc levels: Moderate to marked disc space narrowing along the entirety of the cervical spine with small posterior marginal osteophytes and uncinate spurring secondary to uncovertebral joint osteoarthritis. These contribute to multilevel bilateral neural foraminal encroachment. No jumped or perched facets. Mild grade 1 anterolisthesis of C6 on C7. Upper chest: No pneumonic consolidations. Limited assessment due to patient respiratory motion. Other: Moderate atherosclerosis of the extracranial carotid arteries bilaterally. IMPRESSION: 1. No acute intracranial abnormality. 2. Atrophy with chronic microvascular ischemic disease. 3. Cervical spondylosis without acute posttraumatic cervical spine fracture. Electronically Signed   By: Ashley Royalty M.D.   On: 06/13/2017 23:16   Dg Chest Port 1 View  Result Date: 06/20/2017 CLINICAL DATA:  Pulmonary edema, cough EXAM: PORTABLE CHEST 1 VIEW COMPARISON:  06/17/2017 FINDINGS: Small layering left effusion with left lower lobe consolidation. Cardiomegaly with vascular congestion and diffuse interstitial prominence, likely interstitial edema. No real change since prior study. IMPRESSION: Pulmonary edema pattern is unchanged. Left effusion with left lower lobe atelectasis or infiltrate. Electronically Signed   By: Rolm Baptise M.D.   On: 06/20/2017 08:17   Dg Chest Port 1 View  Result Date: 06/17/2017 CLINICAL DATA:  Pulmonary edema. EXAM: PORTABLE CHEST 1 VIEW COMPARISON:  Chest x-ray from yesterday. FINDINGS: Stable cardiomegaly,  central vascular congestion, and perihilar and lower lobe predominant alveolar and interstitial edema, not significantly changed. Layering left-sided pleural effusion is also unchanged. No consolidation or pneumothorax. No acute osseous abnormality. Large hiatal hernia again noted. IMPRESSION: 1. No significant interval change in pulmonary edema and layering pleural effusion. Electronically Signed   By: Titus Dubin M.D.   On: 06/17/2017 10:04   Dg Chest Port 1 View  Result Date: 06/16/2017 CLINICAL DATA:  CHF EXAM: PORTABLE CHEST 1 VIEW COMPARISON:  Yesterday FINDINGS: Cardiopericardial enlargement and hilar vascular enlargement. There is a large hiatal hernia partially containing gas. Presume layering pleural fluid. Generalized interstitial coarsening that is improved. IMPRESSION: 1. Questionable improvement in pulmonary edema since yesterday. 2. Layering pleural fluid greater on the left. 3. Large hiatal hernia. Electronically Signed   By: Monte Fantasia M.D.   On: 06/16/2017 07:25   Dg Chest Port 1 View  Result Date: 06/15/2017 CLINICAL DATA:  Altered mental status. History of diabetes and hypertension, former smoker. EXAM: PORTABLE CHEST 1 VIEW COMPARISON:  Portable chest x-ray of June 13, 2017 FINDINGS: The pulmonary interstitial markings are more conspicuous today. The pulmonary vascularity is engorged and the cardiac silhouette is enlarged. There is a smaller moderate size left pleural effusion. There calcification in the wall of the aortic arch. The bony thorax exhibits no acute abnormality. IMPRESSION: Acute cardiac decompensation with pulmonary vascular congestion and  pulmonary edema. Small to moderate size left pleural effusion. Thoracic aortic atherosclerosis. Electronically Signed   By: David  Martinique M.D.   On: 06/15/2017 11:21   Dg Knee Complete 4 Views Left  Result Date: 06/13/2017 CLINICAL DATA:  Fall from recliner. EXAM: LEFT KNEE - COMPLETE 4+ VIEW COMPARISON:  None. FINDINGS:  No acute fracture or dislocation. Advanced tricompartmental osteoarthritis with tricompartmental joint space narrowing and peripheral spurring, most prominent in the medial tibiofemoral compartment. Mild chondrocalcinosis. There is a small joint effusion. The bones are under mineralized. IMPRESSION: 1. No fracture or dislocation of the left knee. 2. Advanced tricompartmental osteoarthritis with chondrocalcinosis. 3. Joint effusion. Electronically Signed   By: Jeb Levering M.D.   On: 06/13/2017 23:44   Dg Knee Complete 4 Views Right  Result Date: 06/13/2017 CLINICAL DATA:  Fall from recliner. EXAM: RIGHT KNEE - COMPLETE 4+ VIEW COMPARISON:  None. FINDINGS: Right knee total arthroplasty in expected alignment. No periprosthetic fracture or lucency. There is been patellar resurfacing. No joint effusion. IMPRESSION: No acute fracture.  Right knee arthroplasty without complication. Electronically Signed   By: Jeb Levering M.D.   On: 06/13/2017 23:45   Dg Hips Bilat With Pelvis Min 5 Views  Result Date: 06/13/2017 CLINICAL DATA:  Fall from recliner.  Hip pain. EXAM: DG HIP (WITH OR WITHOUT PELVIS) 5+V BILAT COMPARISON:  None. FINDINGS: The cortical margins of the bony pelvis are intact. No fracture. Pubic symphysis and sacroiliac joints are congruent. Both femoral heads are well-seated in the respective acetabula. Osteoarthritis of both hips, right greater than left. Bones are under mineralized. IMPRESSION: No fracture of the pelvis or hips. Electronically Signed   By: Jeb Levering M.D.   On: 06/13/2017 23:47      Subjective: Patient sitting on reclining chair near nurses station.  Refuses to answer questions.  Mumbles incomprehensively.  As per RN, patient was impulsive overnight, attempting to get out of bed and hence brought close to the nurses station.  No acute issues reported.  No dyspnea noted.  Discharge Exam:  Vitals:   06/20/17 2259 06/21/17 0515 06/21/17 1500 06/22/17 0450  BP:  (!) 141/67 136/68 (!) 133/35 (!) 126/55  Pulse: 93 85 88 (!) 102  Resp: 16 16 18 20   Temp: 99.4 F (37.4 C) 99 F (37.2 C) 99 F (37.2 C)   TempSrc: Axillary Axillary Axillary Oral  SpO2: 97% 98% 98% 94%  Weight:      Height:        General exam: Pleasant elderly female, small built and frail, sitting up comfortably in chair. Respiratory system: Reduced breath sounds in the bases but otherwise clear to auscultation.  No increased work of breathing.  No wheezing, crackles or rhonchi appreciated. Cardiovascular system: S1 and S2 heard, RRR.  No JVD, murmurs or pedal edema. Gastrointestinal system: Abdomen is nondistended, soft and nontender. No organomegaly or masses felt. Normal bowel sounds heard.   Central nervous system: Alert and oriented only to self. No focal neurological deficits. Extremities: Moves all limbs symmetrically. Psychiatric: Pleasant and confused.  Not agitated.      The results of significant diagnostics from this hospitalization (including imaging, microbiology, ancillary and laboratory) are listed below for reference.     Microbiology: Recent Results (from the past 240 hour(s))  Blood culture (routine x 2)     Status: None   Collection Time: 06/13/17 11:50 PM  Result Value Ref Range Status   Specimen Description BLOOD RIGHT ARM  Final   Special Requests  Final    BOTTLES DRAWN AEROBIC AND ANAEROBIC Blood Culture adequate volume   Culture   Final    NO GROWTH 5 DAYS Performed at [facility], [place] [address] [place], [place] [address]    Report Status 06/19/2017 FINAL  Final  Respiratory Panel by PCR     Status: None   Collection Time: 06/14/17 12:09 AM  Result Value Ref Range Status   Adenovirus NOT DETECTED NOT DETECTED Final   Coronavirus 229E NOT DETECTED NOT DETECTED Final   Coronavirus HKU1 NOT DETECTED NOT DETECTED Final   Coronavirus NL63 NOT DETECTED NOT DETECTED Final   Coronavirus OC43 NOT DETECTED NOT DETECTED Final    Metapneumovirus NOT DETECTED NOT DETECTED Final   Rhinovirus / Enterovirus NOT DETECTED NOT DETECTED Final   Influenza A NOT DETECTED NOT DETECTED Final   Influenza A H1 NOT DETECTED NOT DETECTED Final   Influenza A H1 2009 NOT DETECTED NOT DETECTED Final   Influenza A H3 NOT DETECTED NOT DETECTED Final   Influenza B NOT DETECTED NOT DETECTED Final   Parainfluenza Virus 1 NOT DETECTED NOT DETECTED Final   Parainfluenza Virus 2 NOT DETECTED NOT DETECTED Final   Parainfluenza Virus 3 NOT DETECTED NOT DETECTED Final   Parainfluenza Virus 4 NOT DETECTED NOT DETECTED Final   Respiratory Syncytial Virus NOT DETECTED NOT DETECTED Final   Bordetella pertussis NOT DETECTED NOT DETECTED Final   Chlamydophila pneumoniae NOT DETECTED NOT DETECTED Final   Mycoplasma pneumoniae NOT DETECTED NOT DETECTED Final    Comment: Performed at [facility], [place] [address] [place], [place] [address]  Blood culture (routine x 2)     Status: None   Collection Time: 06/14/17 12:18 AM  Result Value Ref Range Status   Specimen Description BLOOD LEFT ANTECUBITAL  Final   Special Requests   Final    BOTTLES DRAWN AEROBIC AND ANAEROBIC Blood Culture adequate volume   Culture   Final    NO GROWTH 5 DAYS Performed at [facility], [place] [address] [place], [place] [address]    Report Status 06/19/2017 FINAL  Final  MRSA PCR Screening     Status: Abnormal   Collection Time: 06/16/17  6:43 AM  Result Value Ref Range Status   MRSA by PCR POSITIVE (A) NEGATIVE Final    Comment:        The GeneXpert MRSA Assay (FDA approved for NASAL specimens only), is one component of a comprehensive MRSA colonization surveillance program. It is not intended to diagnose MRSA infection nor to guide or monitor treatment for MRSA infections. RESULT CALLED TO, READ BACK BY AND VERIFIED WITH: Z. Calwitan RN 9:35 06/16/17 ([email or web address])      Labs: CBC: Recent Labs  Lab 06/20/17 0534  WBC 4.3  NEUTROABS 2.3  HGB 9.2*   HCT 27.0*  MCV 102.7*  PLT 299   Basic Metabolic Panel: Recent Labs  Lab 06/16/17 0649 06/20/17 0534  NA 141 139  K 4.6 4.1  CL 107 104  CO2 24 26  GLUCOSE 88 90  BUN 17 18  CREATININE 0.83 0.67  CALCIUM 8.3* 8.6*  MG 1.8  --   PHOS 3.3 3.4   Liver Function Tests: Recent Labs  Lab 06/16/17 0649 06/20/17 0534  ALBUMIN 2.3* 2.3*   BNP (last 3 results) Recent Labs    06/16/17 0649  BNP 142.7*   CBG: Recent Labs  Lab 06/21/17 0745 06/21/17 1150 06/21/17 1655 06/21/17 2223 06/22/17 1220  GLUCAP 114* 148* 121* 133* 124*   Urinalysis    Component Value Date/Time   COLORURINE YELLOW 06/13/2017 2356   APPEARANCEUR CLEAR 06/13/2017 2356   LABSPEC 1.025 06/13/2017 2356   PHURINE 5.0 06/13/2017 2356   GLUCOSEU NEGATIVE 06/13/2017 2356   HGBUR NEGATIVE 06/13/2017 2356   BILIRUBINUR NEGATIVE 06/13/2017 2356   KETONESUR NEGATIVE 06/13/2017 2356   PROTEINUR NEGATIVE 06/13/2017 2356   UROBILINOGEN 0.2 06/28/2014 0456   NITRITE NEGATIVE 06/13/2017 2356   LEUKOCYTESUR NEGATIVE 06/13/2017 2356      Time coordinating discharge: Over 30 minutes  SIGNED:  Vernell Leep, MD, FACP, [facility]. [facility] Pager [phone]  If 7PM-7AM, please contact night-coverage [email or web address] Password [facility] 06/22/2017, 12:58 PM

## 2017-06-22 NOTE — Social Work (Signed)
CSW f/u for disposition as patient will discharge to [facility] when ready.  Elissa Hefty, LCSW Clinical Social Worker [phone]

## 2017-06-22 NOTE — Progress Notes (Signed)
Patient not available at this time for CPAP. Not in room.

## 2017-06-22 NOTE — Clinical Social Work Placement (Signed)
   CLINICAL SOCIAL WORK PLACEMENT  NOTE  Date:  06/22/2017  Patient Details  Name: JEANNIA TATRO MRN: [id number] Date of Birth: 05/07/1920  Clinical Social Work is seeking post-discharge placement for this patient at the [place] level of care (*CSW will initial, date and re-position this form in  chart as items are completed):  Yes   Patient/family provided with [place] Work Department's list of facilities offering this level of care within the geographic area requested by the patient (or if unable, by the patient's family).  Yes   Patient/family informed of their freedom to choose among providers that offer the needed level of care, that participate in Medicare, Medicaid or managed care program needed by the patient, have an available bed and are willing to accept the patient.  Yes   Patient/family informed of [facility]'s ownership interest in [facility] and [facility], as well as of the fact that they are under no obligation to receive care at these facilities.  PASRR submitted to EDS on       PASRR number received on       Existing PASRR number confirmed on 06/15/17     FL2 transmitted to all facilities in geographic area requested by pt/family on  06/20/2017     FL2 transmitted to all facilities within larger geographic area on       Patient informed that his/her managed care company has contracts with or will negotiate with certain facilities, including the following:        Yes   Patient/family informed of bed offers received.  Patient chooses bed at [place], [place]     Physician recommends and patient chooses bed at      Patient to be transferred to [facility] on 06/21/17.  Patient to be transferred to facility by [facility]     Patient family notified on 06/22/17 of transfer.  Name of family member notified:  daughter, Sunday Spillers at bedside     PHYSICIAN       Additional Comment:     _______________________________________________ Normajean Baxter, LCSW 06/22/2017, 9:55 AM

## 2017-06-22 NOTE — Progress Notes (Signed)
Report given to [place] (nurse)  Awaiting for [facility] for transportation to [facility]

## 2017-06-22 NOTE — Discharge Instructions (Signed)

## 2017-06-26 DIAGNOSIS — R531 Weakness: Secondary | ICD-10-CM | POA: Diagnosis not present

## 2017-06-28 DIAGNOSIS — I509 Heart failure, unspecified: Secondary | ICD-10-CM | POA: Diagnosis not present

## 2017-07-11 DIAGNOSIS — D539 Nutritional anemia, unspecified: Secondary | ICD-10-CM | POA: Diagnosis not present

## 2017-07-11 DIAGNOSIS — E119 Type 2 diabetes mellitus without complications: Secondary | ICD-10-CM | POA: Diagnosis not present

## 2017-07-11 DIAGNOSIS — I509 Heart failure, unspecified: Secondary | ICD-10-CM | POA: Diagnosis not present

## 2017-07-11 DIAGNOSIS — Z9181 History of falling: Secondary | ICD-10-CM | POA: Diagnosis not present

## 2017-07-11 DIAGNOSIS — R54 Age-related physical debility: Secondary | ICD-10-CM | POA: Diagnosis not present

## 2017-07-11 DIAGNOSIS — R634 Abnormal weight loss: Secondary | ICD-10-CM | POA: Diagnosis not present

## 2017-07-11 DIAGNOSIS — R2681 Unsteadiness on feet: Secondary | ICD-10-CM | POA: Diagnosis not present

## 2017-07-11 DIAGNOSIS — F329 Major depressive disorder, single episode, unspecified: Secondary | ICD-10-CM | POA: Diagnosis not present

## 2017-07-11 DIAGNOSIS — Z96651 Presence of right artificial knee joint: Secondary | ICD-10-CM | POA: Diagnosis not present

## 2017-07-11 DIAGNOSIS — R600 Localized edema: Secondary | ICD-10-CM | POA: Diagnosis not present

## 2017-07-11 DIAGNOSIS — F0390 Unspecified dementia without behavioral disturbance: Secondary | ICD-10-CM | POA: Diagnosis not present

## 2017-07-11 DIAGNOSIS — I110 Hypertensive heart disease with heart failure: Secondary | ICD-10-CM | POA: Diagnosis not present

## 2017-07-11 DIAGNOSIS — F0281 Dementia in other diseases classified elsewhere with behavioral disturbance: Secondary | ICD-10-CM | POA: Diagnosis not present

## 2017-07-11 DIAGNOSIS — M6281 Muscle weakness (generalized): Secondary | ICD-10-CM | POA: Diagnosis not present

## 2017-07-11 DIAGNOSIS — F419 Anxiety disorder, unspecified: Secondary | ICD-10-CM | POA: Diagnosis not present

## 2017-07-11 DIAGNOSIS — I5089 Other heart failure: Secondary | ICD-10-CM | POA: Diagnosis not present

## 2017-07-11 DIAGNOSIS — R2689 Other abnormalities of gait and mobility: Secondary | ICD-10-CM | POA: Diagnosis not present

## 2017-07-12 DIAGNOSIS — F0390 Unspecified dementia without behavioral disturbance: Secondary | ICD-10-CM | POA: Diagnosis not present

## 2017-07-12 DIAGNOSIS — I110 Hypertensive heart disease with heart failure: Secondary | ICD-10-CM | POA: Diagnosis not present

## 2017-07-12 DIAGNOSIS — M6281 Muscle weakness (generalized): Secondary | ICD-10-CM | POA: Diagnosis not present

## 2017-07-12 DIAGNOSIS — R2681 Unsteadiness on feet: Secondary | ICD-10-CM | POA: Diagnosis not present

## 2017-07-12 DIAGNOSIS — E119 Type 2 diabetes mellitus without complications: Secondary | ICD-10-CM | POA: Diagnosis not present

## 2017-07-12 DIAGNOSIS — I509 Heart failure, unspecified: Secondary | ICD-10-CM | POA: Diagnosis not present

## 2017-07-13 DIAGNOSIS — I509 Heart failure, unspecified: Secondary | ICD-10-CM | POA: Diagnosis not present

## 2017-07-13 DIAGNOSIS — F0390 Unspecified dementia without behavioral disturbance: Secondary | ICD-10-CM | POA: Diagnosis not present

## 2017-07-13 DIAGNOSIS — I110 Hypertensive heart disease with heart failure: Secondary | ICD-10-CM | POA: Diagnosis not present

## 2017-07-13 DIAGNOSIS — F0391 Unspecified dementia with behavioral disturbance: Secondary | ICD-10-CM | POA: Diagnosis not present

## 2017-07-13 DIAGNOSIS — R2681 Unsteadiness on feet: Secondary | ICD-10-CM | POA: Diagnosis not present

## 2017-07-13 DIAGNOSIS — M6281 Muscle weakness (generalized): Secondary | ICD-10-CM | POA: Diagnosis not present

## 2017-07-13 DIAGNOSIS — E119 Type 2 diabetes mellitus without complications: Secondary | ICD-10-CM | POA: Diagnosis not present

## 2017-07-17 DIAGNOSIS — I110 Hypertensive heart disease with heart failure: Secondary | ICD-10-CM | POA: Diagnosis not present

## 2017-07-17 DIAGNOSIS — E119 Type 2 diabetes mellitus without complications: Secondary | ICD-10-CM | POA: Diagnosis not present

## 2017-07-17 DIAGNOSIS — M6281 Muscle weakness (generalized): Secondary | ICD-10-CM | POA: Diagnosis not present

## 2017-07-17 DIAGNOSIS — I509 Heart failure, unspecified: Secondary | ICD-10-CM | POA: Diagnosis not present

## 2017-07-17 DIAGNOSIS — R2681 Unsteadiness on feet: Secondary | ICD-10-CM | POA: Diagnosis not present

## 2017-07-17 DIAGNOSIS — F0390 Unspecified dementia without behavioral disturbance: Secondary | ICD-10-CM | POA: Diagnosis not present

## 2017-07-18 DIAGNOSIS — Z79899 Other long term (current) drug therapy: Secondary | ICD-10-CM | POA: Diagnosis not present

## 2017-07-18 DIAGNOSIS — G308 Other Alzheimer's disease: Secondary | ICD-10-CM | POA: Diagnosis not present

## 2017-07-18 DIAGNOSIS — R600 Localized edema: Secondary | ICD-10-CM | POA: Diagnosis not present

## 2017-07-18 DIAGNOSIS — R2689 Other abnormalities of gait and mobility: Secondary | ICD-10-CM | POA: Diagnosis not present

## 2017-07-18 DIAGNOSIS — R54 Age-related physical debility: Secondary | ICD-10-CM | POA: Diagnosis not present

## 2017-07-21 DIAGNOSIS — R2681 Unsteadiness on feet: Secondary | ICD-10-CM | POA: Diagnosis not present

## 2017-07-21 DIAGNOSIS — I509 Heart failure, unspecified: Secondary | ICD-10-CM | POA: Diagnosis not present

## 2017-07-21 DIAGNOSIS — I110 Hypertensive heart disease with heart failure: Secondary | ICD-10-CM | POA: Diagnosis not present

## 2017-07-21 DIAGNOSIS — E559 Vitamin D deficiency, unspecified: Secondary | ICD-10-CM | POA: Diagnosis not present

## 2017-07-21 DIAGNOSIS — E119 Type 2 diabetes mellitus without complications: Secondary | ICD-10-CM | POA: Diagnosis not present

## 2017-07-21 DIAGNOSIS — Z79899 Other long term (current) drug therapy: Secondary | ICD-10-CM | POA: Diagnosis not present

## 2017-07-21 DIAGNOSIS — F0390 Unspecified dementia without behavioral disturbance: Secondary | ICD-10-CM | POA: Diagnosis not present

## 2017-07-21 DIAGNOSIS — M6281 Muscle weakness (generalized): Secondary | ICD-10-CM | POA: Diagnosis not present

## 2017-07-24 DIAGNOSIS — F0391 Unspecified dementia with behavioral disturbance: Secondary | ICD-10-CM | POA: Diagnosis not present

## 2017-07-25 DIAGNOSIS — G308 Other Alzheimer's disease: Secondary | ICD-10-CM | POA: Diagnosis not present

## 2017-07-25 DIAGNOSIS — R2689 Other abnormalities of gait and mobility: Secondary | ICD-10-CM | POA: Diagnosis not present

## 2017-07-25 DIAGNOSIS — R600 Localized edema: Secondary | ICD-10-CM | POA: Diagnosis not present

## 2017-07-25 DIAGNOSIS — Z79899 Other long term (current) drug therapy: Secondary | ICD-10-CM | POA: Diagnosis not present

## 2017-07-25 DIAGNOSIS — R54 Age-related physical debility: Secondary | ICD-10-CM | POA: Diagnosis not present

## 2017-07-26 DIAGNOSIS — E119 Type 2 diabetes mellitus without complications: Secondary | ICD-10-CM | POA: Diagnosis not present

## 2017-07-26 DIAGNOSIS — I509 Heart failure, unspecified: Secondary | ICD-10-CM | POA: Diagnosis not present

## 2017-07-26 DIAGNOSIS — I110 Hypertensive heart disease with heart failure: Secondary | ICD-10-CM | POA: Diagnosis not present

## 2017-07-26 DIAGNOSIS — M6281 Muscle weakness (generalized): Secondary | ICD-10-CM | POA: Diagnosis not present

## 2017-07-26 DIAGNOSIS — F0390 Unspecified dementia without behavioral disturbance: Secondary | ICD-10-CM | POA: Diagnosis not present

## 2017-07-26 DIAGNOSIS — R2681 Unsteadiness on feet: Secondary | ICD-10-CM | POA: Diagnosis not present

## 2017-07-27 DIAGNOSIS — M6281 Muscle weakness (generalized): Secondary | ICD-10-CM | POA: Diagnosis not present

## 2017-07-27 DIAGNOSIS — R2681 Unsteadiness on feet: Secondary | ICD-10-CM | POA: Diagnosis not present

## 2017-07-27 DIAGNOSIS — Z79899 Other long term (current) drug therapy: Secondary | ICD-10-CM | POA: Diagnosis not present

## 2017-07-27 DIAGNOSIS — E119 Type 2 diabetes mellitus without complications: Secondary | ICD-10-CM | POA: Diagnosis not present

## 2017-07-27 DIAGNOSIS — R54 Age-related physical debility: Secondary | ICD-10-CM | POA: Diagnosis not present

## 2017-07-27 DIAGNOSIS — I509 Heart failure, unspecified: Secondary | ICD-10-CM | POA: Diagnosis not present

## 2017-07-27 DIAGNOSIS — R600 Localized edema: Secondary | ICD-10-CM | POA: Diagnosis not present

## 2017-07-27 DIAGNOSIS — F0390 Unspecified dementia without behavioral disturbance: Secondary | ICD-10-CM | POA: Diagnosis not present

## 2017-07-27 DIAGNOSIS — E559 Vitamin D deficiency, unspecified: Secondary | ICD-10-CM | POA: Diagnosis not present

## 2017-07-27 DIAGNOSIS — I110 Hypertensive heart disease with heart failure: Secondary | ICD-10-CM | POA: Diagnosis not present

## 2017-07-27 DIAGNOSIS — R2689 Other abnormalities of gait and mobility: Secondary | ICD-10-CM | POA: Diagnosis not present

## 2017-07-28 DIAGNOSIS — M6281 Muscle weakness (generalized): Secondary | ICD-10-CM | POA: Diagnosis not present

## 2017-07-28 DIAGNOSIS — I509 Heart failure, unspecified: Secondary | ICD-10-CM | POA: Diagnosis not present

## 2017-07-28 DIAGNOSIS — I110 Hypertensive heart disease with heart failure: Secondary | ICD-10-CM | POA: Diagnosis not present

## 2017-07-28 DIAGNOSIS — R2681 Unsteadiness on feet: Secondary | ICD-10-CM | POA: Diagnosis not present

## 2017-07-28 DIAGNOSIS — F0390 Unspecified dementia without behavioral disturbance: Secondary | ICD-10-CM | POA: Diagnosis not present

## 2017-07-28 DIAGNOSIS — E119 Type 2 diabetes mellitus without complications: Secondary | ICD-10-CM | POA: Diagnosis not present

## 2017-07-31 DIAGNOSIS — I509 Heart failure, unspecified: Secondary | ICD-10-CM | POA: Diagnosis not present

## 2017-07-31 DIAGNOSIS — R2681 Unsteadiness on feet: Secondary | ICD-10-CM | POA: Diagnosis not present

## 2017-07-31 DIAGNOSIS — E119 Type 2 diabetes mellitus without complications: Secondary | ICD-10-CM | POA: Diagnosis not present

## 2017-07-31 DIAGNOSIS — I110 Hypertensive heart disease with heart failure: Secondary | ICD-10-CM | POA: Diagnosis not present

## 2017-07-31 DIAGNOSIS — M6281 Muscle weakness (generalized): Secondary | ICD-10-CM | POA: Diagnosis not present

## 2017-07-31 DIAGNOSIS — F0390 Unspecified dementia without behavioral disturbance: Secondary | ICD-10-CM | POA: Diagnosis not present

## 2017-08-01 DIAGNOSIS — K5900 Constipation, unspecified: Secondary | ICD-10-CM | POA: Diagnosis not present

## 2017-08-01 DIAGNOSIS — R2689 Other abnormalities of gait and mobility: Secondary | ICD-10-CM | POA: Diagnosis not present

## 2017-08-01 DIAGNOSIS — R600 Localized edema: Secondary | ICD-10-CM | POA: Diagnosis not present

## 2017-08-01 DIAGNOSIS — G308 Other Alzheimer's disease: Secondary | ICD-10-CM | POA: Diagnosis not present

## 2017-08-01 DIAGNOSIS — R54 Age-related physical debility: Secondary | ICD-10-CM | POA: Diagnosis not present

## 2017-08-01 DIAGNOSIS — Z79899 Other long term (current) drug therapy: Secondary | ICD-10-CM | POA: Diagnosis not present

## 2017-08-02 DIAGNOSIS — F0390 Unspecified dementia without behavioral disturbance: Secondary | ICD-10-CM | POA: Diagnosis not present

## 2017-08-02 DIAGNOSIS — M6281 Muscle weakness (generalized): Secondary | ICD-10-CM | POA: Diagnosis not present

## 2017-08-02 DIAGNOSIS — I509 Heart failure, unspecified: Secondary | ICD-10-CM | POA: Diagnosis not present

## 2017-08-02 DIAGNOSIS — E119 Type 2 diabetes mellitus without complications: Secondary | ICD-10-CM | POA: Diagnosis not present

## 2017-08-02 DIAGNOSIS — I110 Hypertensive heart disease with heart failure: Secondary | ICD-10-CM | POA: Diagnosis not present

## 2017-08-02 DIAGNOSIS — R2681 Unsteadiness on feet: Secondary | ICD-10-CM | POA: Diagnosis not present

## 2017-08-07 DIAGNOSIS — F0390 Unspecified dementia without behavioral disturbance: Secondary | ICD-10-CM | POA: Diagnosis not present

## 2017-08-07 DIAGNOSIS — H1859 Other hereditary corneal dystrophies: Secondary | ICD-10-CM | POA: Diagnosis not present

## 2017-08-07 DIAGNOSIS — H02831 Dermatochalasis of right upper eyelid: Secondary | ICD-10-CM | POA: Diagnosis not present

## 2017-08-07 DIAGNOSIS — H10023 Other mucopurulent conjunctivitis, bilateral: Secondary | ICD-10-CM | POA: Diagnosis not present

## 2017-08-07 DIAGNOSIS — H02834 Dermatochalasis of left upper eyelid: Secondary | ICD-10-CM | POA: Diagnosis not present

## 2017-08-07 DIAGNOSIS — R2681 Unsteadiness on feet: Secondary | ICD-10-CM | POA: Diagnosis not present

## 2017-08-07 DIAGNOSIS — H40013 Open angle with borderline findings, low risk, bilateral: Secondary | ICD-10-CM | POA: Diagnosis not present

## 2017-08-07 DIAGNOSIS — I509 Heart failure, unspecified: Secondary | ICD-10-CM | POA: Diagnosis not present

## 2017-08-07 DIAGNOSIS — M6281 Muscle weakness (generalized): Secondary | ICD-10-CM | POA: Diagnosis not present

## 2017-08-07 DIAGNOSIS — I110 Hypertensive heart disease with heart failure: Secondary | ICD-10-CM | POA: Diagnosis not present

## 2017-08-07 DIAGNOSIS — Z961 Presence of intraocular lens: Secondary | ICD-10-CM | POA: Diagnosis not present

## 2017-08-07 DIAGNOSIS — E119 Type 2 diabetes mellitus without complications: Secondary | ICD-10-CM | POA: Diagnosis not present

## 2017-08-07 DIAGNOSIS — H353131 Nonexudative age-related macular degeneration, bilateral, early dry stage: Secondary | ICD-10-CM | POA: Diagnosis not present

## 2017-08-07 DIAGNOSIS — H43811 Vitreous degeneration, right eye: Secondary | ICD-10-CM | POA: Diagnosis not present

## 2017-08-08 DIAGNOSIS — R2689 Other abnormalities of gait and mobility: Secondary | ICD-10-CM | POA: Diagnosis not present

## 2017-08-08 DIAGNOSIS — Z79899 Other long term (current) drug therapy: Secondary | ICD-10-CM | POA: Diagnosis not present

## 2017-08-08 DIAGNOSIS — K5900 Constipation, unspecified: Secondary | ICD-10-CM | POA: Diagnosis not present

## 2017-08-08 DIAGNOSIS — R54 Age-related physical debility: Secondary | ICD-10-CM | POA: Diagnosis not present

## 2017-08-08 DIAGNOSIS — M79675 Pain in left toe(s): Secondary | ICD-10-CM | POA: Diagnosis not present

## 2017-08-08 DIAGNOSIS — G308 Other Alzheimer's disease: Secondary | ICD-10-CM | POA: Diagnosis not present

## 2017-08-09 DIAGNOSIS — E119 Type 2 diabetes mellitus without complications: Secondary | ICD-10-CM | POA: Diagnosis not present

## 2017-08-09 DIAGNOSIS — M6281 Muscle weakness (generalized): Secondary | ICD-10-CM | POA: Diagnosis not present

## 2017-08-09 DIAGNOSIS — I509 Heart failure, unspecified: Secondary | ICD-10-CM | POA: Diagnosis not present

## 2017-08-09 DIAGNOSIS — I110 Hypertensive heart disease with heart failure: Secondary | ICD-10-CM | POA: Diagnosis not present

## 2017-08-09 DIAGNOSIS — R2681 Unsteadiness on feet: Secondary | ICD-10-CM | POA: Diagnosis not present

## 2017-08-09 DIAGNOSIS — F0390 Unspecified dementia without behavioral disturbance: Secondary | ICD-10-CM | POA: Diagnosis not present

## 2017-08-10 DIAGNOSIS — I70293 Other atherosclerosis of native arteries of extremities, bilateral legs: Secondary | ICD-10-CM | POA: Diagnosis not present

## 2017-08-10 DIAGNOSIS — B351 Tinea unguium: Secondary | ICD-10-CM | POA: Diagnosis not present

## 2017-08-10 DIAGNOSIS — S90934A Unspecified superficial injury of right lesser toe(s), initial encounter: Secondary | ICD-10-CM | POA: Diagnosis not present

## 2017-08-10 DIAGNOSIS — M79676 Pain in unspecified toe(s): Secondary | ICD-10-CM | POA: Diagnosis not present

## 2017-08-10 DIAGNOSIS — R262 Difficulty in walking, not elsewhere classified: Secondary | ICD-10-CM | POA: Diagnosis not present

## 2017-08-10 DIAGNOSIS — S91204A Unspecified open wound of right lesser toe(s) with damage to nail, initial encounter: Secondary | ICD-10-CM | POA: Diagnosis not present

## 2017-08-10 DIAGNOSIS — L605 Yellow nail syndrome: Secondary | ICD-10-CM | POA: Diagnosis not present

## 2017-08-14 DIAGNOSIS — R2681 Unsteadiness on feet: Secondary | ICD-10-CM | POA: Diagnosis not present

## 2017-08-14 DIAGNOSIS — I509 Heart failure, unspecified: Secondary | ICD-10-CM | POA: Diagnosis not present

## 2017-08-14 DIAGNOSIS — I110 Hypertensive heart disease with heart failure: Secondary | ICD-10-CM | POA: Diagnosis not present

## 2017-08-14 DIAGNOSIS — E119 Type 2 diabetes mellitus without complications: Secondary | ICD-10-CM | POA: Diagnosis not present

## 2017-08-14 DIAGNOSIS — M6281 Muscle weakness (generalized): Secondary | ICD-10-CM | POA: Diagnosis not present

## 2017-08-14 DIAGNOSIS — F0390 Unspecified dementia without behavioral disturbance: Secondary | ICD-10-CM | POA: Diagnosis not present

## 2017-08-15 DIAGNOSIS — K5900 Constipation, unspecified: Secondary | ICD-10-CM | POA: Diagnosis not present

## 2017-08-15 DIAGNOSIS — R54 Age-related physical debility: Secondary | ICD-10-CM | POA: Diagnosis not present

## 2017-08-15 DIAGNOSIS — R2689 Other abnormalities of gait and mobility: Secondary | ICD-10-CM | POA: Diagnosis not present

## 2017-08-15 DIAGNOSIS — Z79899 Other long term (current) drug therapy: Secondary | ICD-10-CM | POA: Diagnosis not present

## 2017-08-15 DIAGNOSIS — G308 Other Alzheimer's disease: Secondary | ICD-10-CM | POA: Diagnosis not present

## 2017-08-16 DIAGNOSIS — I110 Hypertensive heart disease with heart failure: Secondary | ICD-10-CM | POA: Diagnosis not present

## 2017-08-16 DIAGNOSIS — M6281 Muscle weakness (generalized): Secondary | ICD-10-CM | POA: Diagnosis not present

## 2017-08-16 DIAGNOSIS — I509 Heart failure, unspecified: Secondary | ICD-10-CM | POA: Diagnosis not present

## 2017-08-16 DIAGNOSIS — E119 Type 2 diabetes mellitus without complications: Secondary | ICD-10-CM | POA: Diagnosis not present

## 2017-08-16 DIAGNOSIS — R2681 Unsteadiness on feet: Secondary | ICD-10-CM | POA: Diagnosis not present

## 2017-08-16 DIAGNOSIS — F0390 Unspecified dementia without behavioral disturbance: Secondary | ICD-10-CM | POA: Diagnosis not present

## 2017-09-01 DIAGNOSIS — Z79899 Other long term (current) drug therapy: Secondary | ICD-10-CM | POA: Diagnosis not present

## 2017-09-05 DIAGNOSIS — Z79899 Other long term (current) drug therapy: Secondary | ICD-10-CM | POA: Diagnosis not present

## 2017-09-05 DIAGNOSIS — R54 Age-related physical debility: Secondary | ICD-10-CM | POA: Diagnosis not present

## 2017-09-05 DIAGNOSIS — G308 Other Alzheimer's disease: Secondary | ICD-10-CM | POA: Diagnosis not present

## 2017-09-05 DIAGNOSIS — K5900 Constipation, unspecified: Secondary | ICD-10-CM | POA: Diagnosis not present

## 2017-09-05 DIAGNOSIS — R2689 Other abnormalities of gait and mobility: Secondary | ICD-10-CM | POA: Diagnosis not present

## 2017-09-05 DIAGNOSIS — L539 Erythematous condition, unspecified: Secondary | ICD-10-CM | POA: Diagnosis not present

## 2017-09-12 DIAGNOSIS — L539 Erythematous condition, unspecified: Secondary | ICD-10-CM | POA: Diagnosis not present

## 2017-09-12 DIAGNOSIS — G308 Other Alzheimer's disease: Secondary | ICD-10-CM | POA: Diagnosis not present

## 2017-09-12 DIAGNOSIS — R05 Cough: Secondary | ICD-10-CM | POA: Diagnosis not present

## 2017-09-12 DIAGNOSIS — Z79899 Other long term (current) drug therapy: Secondary | ICD-10-CM | POA: Diagnosis not present

## 2017-09-12 DIAGNOSIS — R0989 Other specified symptoms and signs involving the circulatory and respiratory systems: Secondary | ICD-10-CM | POA: Diagnosis not present

## 2017-09-12 DIAGNOSIS — R2689 Other abnormalities of gait and mobility: Secondary | ICD-10-CM | POA: Diagnosis not present

## 2017-09-12 DIAGNOSIS — R54 Age-related physical debility: Secondary | ICD-10-CM | POA: Diagnosis not present

## 2017-09-18 DIAGNOSIS — F0391 Unspecified dementia with behavioral disturbance: Secondary | ICD-10-CM | POA: Diagnosis not present

## 2017-09-26 DIAGNOSIS — G308 Other Alzheimer's disease: Secondary | ICD-10-CM | POA: Diagnosis not present

## 2017-09-26 DIAGNOSIS — Z79899 Other long term (current) drug therapy: Secondary | ICD-10-CM | POA: Diagnosis not present

## 2017-09-26 DIAGNOSIS — R54 Age-related physical debility: Secondary | ICD-10-CM | POA: Diagnosis not present

## 2017-09-26 DIAGNOSIS — R05 Cough: Secondary | ICD-10-CM | POA: Diagnosis not present

## 2017-09-26 DIAGNOSIS — R2689 Other abnormalities of gait and mobility: Secondary | ICD-10-CM | POA: Diagnosis not present

## 2017-10-13 DIAGNOSIS — E559 Vitamin D deficiency, unspecified: Secondary | ICD-10-CM | POA: Diagnosis not present

## 2017-10-23 DIAGNOSIS — F0391 Unspecified dementia with behavioral disturbance: Secondary | ICD-10-CM | POA: Diagnosis not present

## 2017-10-23 DIAGNOSIS — H04203 Unspecified epiphora, bilateral lacrimal glands: Secondary | ICD-10-CM | POA: Diagnosis not present

## 2017-10-23 DIAGNOSIS — R269 Unspecified abnormalities of gait and mobility: Secondary | ICD-10-CM | POA: Diagnosis not present

## 2017-11-14 DIAGNOSIS — I509 Heart failure, unspecified: Secondary | ICD-10-CM | POA: Diagnosis not present

## 2017-11-14 DIAGNOSIS — R269 Unspecified abnormalities of gait and mobility: Secondary | ICD-10-CM | POA: Diagnosis not present

## 2017-11-14 DIAGNOSIS — Z79899 Other long term (current) drug therapy: Secondary | ICD-10-CM | POA: Diagnosis not present

## 2017-11-14 DIAGNOSIS — F0391 Unspecified dementia with behavioral disturbance: Secondary | ICD-10-CM | POA: Diagnosis not present

## 2017-11-14 DIAGNOSIS — E039 Hypothyroidism, unspecified: Secondary | ICD-10-CM | POA: Diagnosis not present

## 2017-11-16 DIAGNOSIS — Z79899 Other long term (current) drug therapy: Secondary | ICD-10-CM | POA: Diagnosis not present

## 2017-11-23 DIAGNOSIS — F0391 Unspecified dementia with behavioral disturbance: Secondary | ICD-10-CM | POA: Diagnosis not present

## 2017-12-05 DIAGNOSIS — K219 Gastro-esophageal reflux disease without esophagitis: Secondary | ICD-10-CM | POA: Diagnosis not present

## 2017-12-05 DIAGNOSIS — Z79899 Other long term (current) drug therapy: Secondary | ICD-10-CM | POA: Diagnosis not present

## 2017-12-05 DIAGNOSIS — F0391 Unspecified dementia with behavioral disturbance: Secondary | ICD-10-CM | POA: Diagnosis not present

## 2017-12-05 DIAGNOSIS — K5900 Constipation, unspecified: Secondary | ICD-10-CM | POA: Diagnosis not present

## 2017-12-05 DIAGNOSIS — I739 Peripheral vascular disease, unspecified: Secondary | ICD-10-CM | POA: Diagnosis not present

## 2017-12-25 DIAGNOSIS — F0150 Vascular dementia without behavioral disturbance: Secondary | ICD-10-CM | POA: Diagnosis not present

## 2017-12-29 DIAGNOSIS — Z79899 Other long term (current) drug therapy: Secondary | ICD-10-CM | POA: Diagnosis not present

## 2018-01-05 DIAGNOSIS — F0150 Vascular dementia without behavioral disturbance: Secondary | ICD-10-CM | POA: Diagnosis not present

## 2018-01-05 DIAGNOSIS — Z79899 Other long term (current) drug therapy: Secondary | ICD-10-CM | POA: Diagnosis not present

## 2018-01-29 DIAGNOSIS — F0150 Vascular dementia without behavioral disturbance: Secondary | ICD-10-CM | POA: Diagnosis not present

## 2018-02-23 DIAGNOSIS — Z23 Encounter for immunization: Secondary | ICD-10-CM | POA: Diagnosis not present

## 2018-03-05 DIAGNOSIS — F0150 Vascular dementia without behavioral disturbance: Secondary | ICD-10-CM | POA: Diagnosis not present

## 2018-03-06 DIAGNOSIS — F0391 Unspecified dementia with behavioral disturbance: Secondary | ICD-10-CM | POA: Diagnosis not present

## 2018-03-06 DIAGNOSIS — S51811A Laceration without foreign body of right forearm, initial encounter: Secondary | ICD-10-CM | POA: Diagnosis not present

## 2018-03-06 DIAGNOSIS — E039 Hypothyroidism, unspecified: Secondary | ICD-10-CM | POA: Diagnosis not present

## 2018-03-06 DIAGNOSIS — Z79899 Other long term (current) drug therapy: Secondary | ICD-10-CM | POA: Diagnosis not present

## 2018-09-01 ENCOUNTER — Other Ambulatory Visit: Payer: Self-pay

## 2018-09-01 ENCOUNTER — Emergency Department (HOSPITAL_COMMUNITY): Payer: Medicare Other

## 2018-09-01 ENCOUNTER — Inpatient Hospital Stay (HOSPITAL_COMMUNITY)
Admission: EM | Admit: 2018-09-01 | Discharge: 2018-09-05 | DRG: 535 | Disposition: A | Discharge status: Hospice | Payer: Medicare Other | Source: Skilled Nursing Facility | Attending: Internal Medicine | Admitting: Internal Medicine

## 2018-09-01 ENCOUNTER — Encounter (HOSPITAL_COMMUNITY): Payer: Self-pay | Admitting: Emergency Medicine

## 2018-09-01 DIAGNOSIS — S72012A Unspecified intracapsular fracture of left femur, initial encounter for closed fracture: Secondary | ICD-10-CM | POA: Diagnosis present

## 2018-09-01 DIAGNOSIS — Y92129 Unspecified place in nursing home as the place of occurrence of the external cause: Secondary | ICD-10-CM

## 2018-09-01 DIAGNOSIS — M8580 Other specified disorders of bone density and structure, unspecified site: Secondary | ICD-10-CM | POA: Diagnosis present

## 2018-09-01 DIAGNOSIS — Z7982 Long term (current) use of aspirin: Secondary | ICD-10-CM

## 2018-09-01 DIAGNOSIS — W1830XA Fall on same level, unspecified, initial encounter: Secondary | ICD-10-CM | POA: Diagnosis present

## 2018-09-01 DIAGNOSIS — Z7401 Bed confinement status: Secondary | ICD-10-CM

## 2018-09-01 DIAGNOSIS — G4733 Obstructive sleep apnea (adult) (pediatric): Secondary | ICD-10-CM | POA: Diagnosis present

## 2018-09-01 DIAGNOSIS — Z79899 Other long term (current) drug therapy: Secondary | ICD-10-CM | POA: Diagnosis not present

## 2018-09-01 DIAGNOSIS — Z96651 Presence of right artificial knee joint: Secondary | ICD-10-CM | POA: Diagnosis present

## 2018-09-01 DIAGNOSIS — D7589 Other specified diseases of blood and blood-forming organs: Secondary | ICD-10-CM | POA: Diagnosis present

## 2018-09-01 DIAGNOSIS — D696 Thrombocytopenia, unspecified: Secondary | ICD-10-CM | POA: Diagnosis not present

## 2018-09-01 DIAGNOSIS — J9601 Acute respiratory failure with hypoxia: Secondary | ICD-10-CM | POA: Diagnosis not present

## 2018-09-01 DIAGNOSIS — K449 Diaphragmatic hernia without obstruction or gangrene: Secondary | ICD-10-CM

## 2018-09-01 DIAGNOSIS — Y92098 Other place in other non-institutional residence as the place of occurrence of the external cause: Secondary | ICD-10-CM | POA: Diagnosis not present

## 2018-09-01 DIAGNOSIS — G4730 Sleep apnea, unspecified: Secondary | ICD-10-CM

## 2018-09-01 DIAGNOSIS — Z79890 Hormone replacement therapy (postmenopausal): Secondary | ICD-10-CM

## 2018-09-01 DIAGNOSIS — Z515 Encounter for palliative care: Secondary | ICD-10-CM | POA: Diagnosis not present

## 2018-09-01 DIAGNOSIS — Z20828 Contact with and (suspected) exposure to other viral communicable diseases: Secondary | ICD-10-CM | POA: Diagnosis present

## 2018-09-01 DIAGNOSIS — I10 Essential (primary) hypertension: Secondary | ICD-10-CM | POA: Diagnosis present

## 2018-09-01 DIAGNOSIS — R0902 Hypoxemia: Secondary | ICD-10-CM | POA: Diagnosis present

## 2018-09-01 DIAGNOSIS — W19XXXA Unspecified fall, initial encounter: Secondary | ICD-10-CM | POA: Diagnosis not present

## 2018-09-01 DIAGNOSIS — N179 Acute kidney failure, unspecified: Secondary | ICD-10-CM | POA: Diagnosis present

## 2018-09-01 DIAGNOSIS — F0280 Dementia in other diseases classified elsewhere without behavioral disturbance: Secondary | ICD-10-CM | POA: Diagnosis present

## 2018-09-01 DIAGNOSIS — K219 Gastro-esophageal reflux disease without esophagitis: Secondary | ICD-10-CM | POA: Diagnosis present

## 2018-09-01 DIAGNOSIS — E785 Hyperlipidemia, unspecified: Secondary | ICD-10-CM | POA: Diagnosis present

## 2018-09-01 DIAGNOSIS — Z87891 Personal history of nicotine dependence: Secondary | ICD-10-CM

## 2018-09-01 DIAGNOSIS — E039 Hypothyroidism, unspecified: Secondary | ICD-10-CM | POA: Diagnosis present

## 2018-09-01 DIAGNOSIS — D72818 Other decreased white blood cell count: Secondary | ICD-10-CM

## 2018-09-01 DIAGNOSIS — D72819 Decreased white blood cell count, unspecified: Secondary | ICD-10-CM | POA: Diagnosis present

## 2018-09-01 DIAGNOSIS — Z66 Do not resuscitate: Secondary | ICD-10-CM | POA: Diagnosis present

## 2018-09-01 DIAGNOSIS — L8990 Pressure ulcer of unspecified site, unspecified stage: Secondary | ICD-10-CM

## 2018-09-01 DIAGNOSIS — M25552 Pain in left hip: Secondary | ICD-10-CM | POA: Diagnosis not present

## 2018-09-01 DIAGNOSIS — R410 Disorientation, unspecified: Secondary | ICD-10-CM | POA: Diagnosis not present

## 2018-09-01 DIAGNOSIS — S72002A Fracture of unspecified part of neck of left femur, initial encounter for closed fracture: Secondary | ICD-10-CM | POA: Diagnosis present

## 2018-09-01 DIAGNOSIS — E119 Type 2 diabetes mellitus without complications: Secondary | ICD-10-CM | POA: Diagnosis present

## 2018-09-01 LAB — COMPREHENSIVE METABOLIC PANEL
ALT: 19 U/L (ref 0–44)
AST: 20 U/L (ref 15–41)
Albumin: 4.1 g/dL (ref 3.5–5.0)
Alkaline Phosphatase: 80 U/L (ref 38–126)
Anion gap: 14 (ref 5–15)
BUN: 40 mg/dL — ABNORMAL HIGH (ref 8–23)
CO2: 28 mmol/L (ref 22–32)
Calcium: 9.6 mg/dL (ref 8.9–10.3)
Chloride: 97 mmol/L — ABNORMAL LOW (ref 98–111)
Creatinine, Ser: 1.45 mg/dL — ABNORMAL HIGH (ref 0.44–1.00)
GFR calc Af Amer: 35 mL/min — ABNORMAL LOW (ref >60)
GFR calc non Af Amer: 31 mL/min — ABNORMAL LOW (ref >60)
Glucose, Bld: 93 mg/dL (ref 70–99)
Potassium: 5 mmol/L (ref 3.5–5.1)
Sodium: 139 mmol/L (ref 135–145)
Total Bilirubin: 0.5 mg/dL (ref 0.3–1.2)
Total Protein: 6.8 g/dL (ref 6.5–8.1)

## 2018-09-01 LAB — PROTIME-INR
INR: 0.9 (ref 0.8–1.2)
Prothrombin Time: 12.3 seconds (ref 11.4–15.2)

## 2018-09-01 LAB — CBC WITH DIFFERENTIAL/PLATELET
Abs Immature Granulocytes: 0.02 10*3/uL (ref 0.00–0.07)
Basophils Absolute: 0 10*3/uL (ref 0.0–0.1)
Basophils Relative: 0 %
Eosinophils Absolute: 0.1 10*3/uL (ref 0.0–0.5)
Eosinophils Relative: 3 %
HCT: 40.1 % (ref 36.0–46.0)
Hemoglobin: 13.2 g/dL (ref 12.0–15.0)
Immature Granulocytes: 1 %
Lymphocytes Relative: 26 %
Lymphs Abs: 0.8 10*3/uL (ref 0.7–4.0)
MCH: 33.6 pg (ref 26.0–34.0)
MCHC: 32.9 g/dL (ref 30.0–36.0)
MCV: 102 fL — ABNORMAL HIGH (ref 80.0–100.0)
Monocytes Absolute: 0.4 10*3/uL (ref 0.1–1.0)
Monocytes Relative: 13 %
Neutro Abs: 1.6 10*3/uL — ABNORMAL LOW (ref 1.7–7.7)
Neutrophils Relative %: 57 %
Platelets: 140 10*3/uL — ABNORMAL LOW (ref 150–400)
RBC: 3.93 MIL/uL (ref 3.87–5.11)
RDW: 15 % (ref 11.5–15.5)
WBC: 2.9 10*3/uL — ABNORMAL LOW (ref 4.0–10.5)
nRBC: 0 % (ref 0.0–0.2)

## 2018-09-01 LAB — TYPE AND SCREEN
ABO/RH(D): A POS
Antibody Screen: NEGATIVE

## 2018-09-01 LAB — HEMOGLOBIN A1C
Hgb A1c MFr Bld: 5.9 % — ABNORMAL HIGH (ref 4.8–5.6)
Mean Plasma Glucose: 122.63 mg/dL

## 2018-09-01 LAB — I-STAT TROPONIN, ED: Troponin i, poc: 0.01 ng/mL (ref 0.00–0.08)

## 2018-09-01 LAB — SURGICAL PCR SCREEN
MRSA, PCR: POSITIVE — AB
Staphylococcus aureus: POSITIVE — AB

## 2018-09-01 LAB — SARS CORONAVIRUS 2 BY RT PCR (HOSPITAL ORDER, PERFORMED IN ~~LOC~~ HOSPITAL LAB): SARS Coronavirus 2: NEGATIVE

## 2018-09-01 MED ORDER — ACETAMINOPHEN 650 MG RE SUPP: 650.0000 mg | Freq: Four times a day (QID) | RECTAL | Status: DC | PRN

## 2018-09-01 MED ORDER — DEXTROSE-NACL 5-0.45 % IV SOLN
INTRAVENOUS | Status: DC
  Administered 2018-09-01: 18:00:00 via INTRAVENOUS

## 2018-09-01 MED ORDER — ACETAMINOPHEN 325 MG PO TABS
650.0000 mg | ORAL_TABLET | Freq: Four times a day (QID) | ORAL | Status: DC | PRN
  Administered 2018-09-02 – 2018-09-04 (×4): 650 mg via ORAL
  Filled 2018-09-01 (×4): qty 2

## 2018-09-01 MED ORDER — CEFAZOLIN SODIUM-DEXTROSE 2-4 GM/100ML-% IV SOLN: 2.0000 g | INTRAVENOUS | Status: AC

## 2018-09-01 MED ORDER — ONDANSETRON HCL 4 MG PO TABS: 4.0000 mg | ORAL_TABLET | Freq: Four times a day (QID) | ORAL | Status: DC | PRN

## 2018-09-01 MED ORDER — CHLORHEXIDINE GLUCONATE 4 % EX LIQD
60.0000 mL | Freq: Once | CUTANEOUS | Status: DC
  Filled 2018-09-01: qty 15

## 2018-09-01 MED ORDER — OXYCODONE HCL 5 MG PO TABS: 5.0000 mg | ORAL_TABLET | ORAL | Status: DC | PRN

## 2018-09-01 MED ORDER — ESCITALOPRAM OXALATE 10 MG PO TABS
10.0000 mg | ORAL_TABLET | Freq: Every day | ORAL | Status: DC
  Administered 2018-09-03 – 2018-09-04 (×2): 10 mg via ORAL
  Filled 2018-09-01 (×2): qty 1

## 2018-09-01 MED ORDER — FENTANYL CITRATE (PF) 100 MCG/2ML IJ SOLN
50.0000 ug | Freq: Once | INTRAMUSCULAR | Status: AC
  Administered 2018-09-01: 50 ug via INTRAVENOUS
  Filled 2018-09-01: qty 2

## 2018-09-01 MED ORDER — OLANZAPINE 5 MG PO TABS
2.5000 mg | ORAL_TABLET | Freq: Every morning | ORAL | Status: DC
  Filled 2018-09-01: qty 1

## 2018-09-01 MED ORDER — KETOTIFEN FUMARATE 0.025 % OP SOLN
1.0000 [drp] | Freq: Two times a day (BID) | OPHTHALMIC | Status: DC
  Administered 2018-09-01 – 2018-09-04 (×6): 1 [drp] via OPHTHALMIC
  Filled 2018-09-01: qty 5

## 2018-09-01 MED ORDER — HYDROMORPHONE HCL 1 MG/ML IJ SOLN
0.5000 mg | INTRAMUSCULAR | Status: DC | PRN
  Administered 2018-09-01: 0.5 mg via INTRAVENOUS
  Filled 2018-09-01: qty 1

## 2018-09-01 MED ORDER — OLANZAPINE 7.5 MG PO TABS
7.5000 mg | ORAL_TABLET | Freq: Every day | ORAL | Status: DC
  Filled 2018-09-01 (×3): qty 1

## 2018-09-01 MED ORDER — SODIUM CHLORIDE 0.9 % IV BOLUS
500.0000 mL | Freq: Once | INTRAVENOUS | Status: AC
  Administered 2018-09-01: 500 mL via INTRAVENOUS

## 2018-09-01 MED ORDER — POVIDONE-IODINE 10 % EX SWAB: 2.0000 "application " | Freq: Once | CUTANEOUS | Status: DC

## 2018-09-01 MED ORDER — ONDANSETRON HCL 4 MG/2ML IJ SOLN: 4.0000 mg | Freq: Four times a day (QID) | INTRAMUSCULAR | Status: DC | PRN

## 2018-09-01 MED ORDER — LORAZEPAM 0.5 MG PO TABS: 0.5000 mg | ORAL_TABLET | Freq: Two times a day (BID) | ORAL | Status: DC | PRN

## 2018-09-01 MED ORDER — ALBUTEROL SULFATE (2.5 MG/3ML) 0.083% IN NEBU: 2.5000 mg | INHALATION_SOLUTION | RESPIRATORY_TRACT | Status: DC | PRN

## 2018-09-01 MED ORDER — POLYETHYLENE GLYCOL 3350 17 G PO PACK: 17.0000 g | PACK | Freq: Every day | ORAL | Status: DC | PRN

## 2018-09-01 MED ORDER — LEVOTHYROXINE SODIUM 100 MCG PO TABS
100.0000 ug | ORAL_TABLET | Freq: Every day | ORAL | Status: DC
  Administered 2018-09-02 – 2018-09-04 (×3): 100 ug via ORAL
  Filled 2018-09-01 (×3): qty 1

## 2018-09-01 MED ORDER — DIVALPROEX SODIUM 125 MG PO CSDR
125.0000 mg | DELAYED_RELEASE_CAPSULE | Freq: Three times a day (TID) | ORAL | Status: DC
  Administered 2018-09-02 – 2018-09-04 (×8): 125 mg via ORAL
  Filled 2018-09-01 (×8): qty 1

## 2018-09-01 MED ORDER — TRAZODONE HCL 50 MG PO TABS: 25.0000 mg | ORAL_TABLET | Freq: Every evening | ORAL | Status: DC | PRN

## 2018-09-01 MED ORDER — MELATONIN 3 MG PO TABS
7.5000 mg | ORAL_TABLET | Freq: Every day | ORAL | Status: DC
  Administered 2018-09-01 – 2018-09-03 (×3): 7.5 mg via ORAL
  Filled 2018-09-01 (×5): qty 2.5

## 2018-09-01 MED ORDER — TRANEXAMIC ACID-NACL 1000-0.7 MG/100ML-% IV SOLN: 1000.0000 mg | INTRAVENOUS | Status: AC

## 2018-09-01 MED ORDER — POLYVINYL ALCOHOL 1.4 % OP SOLN
1.0000 [drp] | OPHTHALMIC | Status: DC | PRN
  Filled 2018-09-01: qty 15

## 2018-09-01 MED ORDER — ARTIFICIAL TEARS OPHTHALMIC OINT
1.0000 "application " | TOPICAL_OINTMENT | Freq: Two times a day (BID) | OPHTHALMIC | Status: DC
  Administered 2018-09-01 – 2018-09-04 (×7): 1 via OPHTHALMIC
  Filled 2018-09-01 (×2): qty 3.5

## 2018-09-01 NOTE — ED Triage Notes (Signed)
Pt arrives via EMS from [facility]. Staff reports witnessed fall around 12:30, pt normally walks with walker but unable to for EMS.Endorses L hip leg, hit head. Denies blood thinners. Pt 87% on RA, EMS reports not checking due to not having a pulse ox.

## 2018-09-01 NOTE — ED Notes (Signed)
ED TO INPATIENT HANDOFF REPORT  ED Nurse Name and Phone #:  Arnez Stoneking [phone]  S Name/Age/Gender Cristina Middleton 85 y.o. female Room/Bed: 022C/022C  Code Status   Code Status: DNR  Home/SNF/Other Nursing Home Patient oriented to: self Is this baseline? Yes   Triage Complete: Triage complete  Chief Complaint Fall  Triage Note Pt arrives via EMS from [facility] unit. Staff reports witnessed fall around 12:30, pt normally walks with walker but unable to for EMS.Endorses L hip leg, hit head. Denies blood thinners. Pt 87% on RA, EMS reports not checking due to not having a pulse ox.    Allergies Allergies  Allergen Reactions  . Penicillins Hives, Itching, Swelling and Other (See Comments)    Reaction:  Unspecified swelling reaction Has patient had a PCN reaction causing immediate rash, facial/tongue/throat swelling, SOB or lightheadedness with hypotension: Yes Has patient had a PCN reaction causing severe rash involving mucus membranes or skin necrosis: No Has patient had a PCN reaction that required hospitalization No Has patient had a PCN reaction occurring within the last 10 years: No If all of the above answers are "NO", then may proceed with Cephalosporin use.  . Sulfa Antibiotics Hives, Itching, Swelling and Other (See Comments)    Reaction:  Unspecified swelling reaction  . Alka-Seltzer [Aspirin Effervescent] Other (See Comments)    Hallucinations    Level of Care/Admitting Diagnosis ED Disposition    ED Disposition Condition [facility] Area: [place] [[id number]]  Level of Care: Telemetry Medical [030]  Covid Evaluation: N/A  Diagnosis: Closed left hip fracture [facility]) [[id number]]  Admitting Physician: Mercy Riding [[id number]]  Attending Physician: Mercy Riding [id number]  Estimated length of stay: past midnight tomorrow  Certification:: I certify this patient will need inpatient services for at least 2 midnights  PT Class (Do  Not Modify): Inpatient [101]  PT Acc Code (Do Not Modify): Private [1]       B Medical/Surgery History Past Medical History:  Diagnosis Date  . Anxiety   . Arthritis   . Diabetes mellitus without complication ([place])   . Dislocated shoulder    WAS POPPED BACK IN PLACE  2006???  . GERD (gastroesophageal reflux disease)    TAKES  ACIPHEX  . Hyperlipidemia   . Hypertension   . Hyperthyroidism    NOT SURE WHICH ONE  . PONV (postoperative nausea and vomiting)    YRS AGO.....(6-7 YRS)  . Sleep apnea    WEARS MASK EVERY NOW AND THEN   Past Surgical History:  Procedure Laterality Date  . BREAST SURGERY     CYST REMOVED -- LEFT  . ENDOVENOUS ABLATION SAPHENOUS VEIN W/ LASER Left 07/11/2016   endovenous laser ablation left greater saphenous vein by Tinnie Gens MD  . [place]  . LIPOMA EXCISION     FROM LEFT BACK  . TONSILLECTOMY    . TOTAL KNEE ARTHROPLASTY  05/25/2012   right knee  . TOTAL KNEE ARTHROPLASTY  05/25/2012   Procedure: TOTAL KNEE ARTHROPLASTY;  Surgeon: Yvette Rack., MD;  Location: [place];  Service: Orthopedics;  Laterality: Right;     A IV Location/Drains/Wounds Patient Lines/Drains/Airways Status   Active Line/Drains/Airways    Name:   Placement date:   Placement time:   Site:   Days:   Peripheral IV 09/01/18 Right Antecubital   09/01/18    1530    Antecubital   less than  1   Peripheral IV 09/01/18 Left Antecubital   09/01/18    1636    Antecubital   less than 1          Intake/Output Last 24 hours  Intake/Output Summary (Last 24 hours) at 09/01/2018 1917 Last data filed at 09/01/2018 1729 Gross per 24 hour  Intake 500 ml  Output -  Net 500 ml    Labs/Imaging Results for orders placed or performed during the hospital encounter of 09/01/18 (from the past 48 hour(s))  CBC with Differential/Platelet     Status: Abnormal   Collection Time: 09/01/18  3:01 PM  Result Value Ref Range   WBC 2.9 (L) 4.0 - 10.5 K/uL   RBC 3.93 3.87 -  5.11 MIL/uL   Hemoglobin 13.2 12.0 - 15.0 g/dL   HCT 40.1 36.0 - 46.0 %   MCV 102.0 (H) 80.0 - 100.0 fL   MCH 33.6 26.0 - 34.0 pg   MCHC 32.9 30.0 - 36.0 g/dL   RDW 15.0 11.5 - 15.5 %   Platelets 140 (L) 150 - 400 K/uL   nRBC 0.0 0.0 - 0.2 %   Neutrophils Relative % 57 %   Neutro Abs 1.6 (L) 1.7 - 7.7 K/uL   Lymphocytes Relative 26 %   Lymphs Abs 0.8 0.7 - 4.0 K/uL   Monocytes Relative 13 %   Monocytes Absolute 0.4 0.1 - 1.0 K/uL   Eosinophils Relative 3 %   Eosinophils Absolute 0.1 0.0 - 0.5 K/uL   Basophils Relative 0 %   Basophils Absolute 0.0 0.0 - 0.1 K/uL   Immature Granulocytes 1 %   Abs Immature Granulocytes 0.02 0.00 - 0.07 K/uL    Comment: Performed at [facility], [address] [place], [place] [address]  Comprehensive metabolic panel     Status: Abnormal   Collection Time: 09/01/18  3:01 PM  Result Value Ref Range   Sodium 139 135 - 145 mmol/L   Potassium 5.0 3.5 - 5.1 mmol/L   Chloride 97 (L) 98 - 111 mmol/L   CO2 28 22 - 32 mmol/L   Glucose, Bld 93 70 - 99 mg/dL   BUN 40 (H) 8 - 23 mg/dL   Creatinine, Ser 1.45 (H) 0.44 - 1.00 mg/dL   Calcium 9.6 8.9 - 10.3 mg/dL   Total Protein 6.8 6.5 - 8.1 g/dL   Albumin 4.1 3.5 - 5.0 g/dL   AST 20 15 - 41 U/L   ALT 19 0 - 44 U/L   Alkaline Phosphatase 80 38 - 126 U/L   Total Bilirubin 0.5 0.3 - 1.2 mg/dL   GFR calc non Af Amer 31 (L) >60 mL/min   GFR calc Af Amer 35 (L) >60 mL/min   Anion gap 14 5 - 15    Comment: Performed at [facility] [address], [place], [place] [address]  Protime-INR     Status: None   Collection Time: 09/01/18  3:01 PM  Result Value Ref Range   Prothrombin Time 12.3 11.4 - 15.2 seconds   INR 0.9 0.8 - 1.2    Comment: (NOTE) INR goal varies based on device and disease states. Performed at [facility], [place] [address], [place], [place] [address]   Type and screen     Status: None   Collection Time: 09/01/18  3:01 PM  Result Value Ref Range   ABO/RH(D) A POS     Antibody Screen NEG    Sample Expiration  09/04/2018 Performed at [facility] [address], [place], [place] [address]   Hemoglobin A1c     Status: Abnormal   Collection Time: 09/01/18  3:01 PM  Result Value Ref Range   Hgb A1c MFr Bld 5.9 (H) 4.8 - 5.6 %    Comment: (NOTE) Pre diabetes:          5.7%-6.4% Diabetes:              >6.4% Glycemic control for   <7.0% adults with diabetes    Mean Plasma Glucose 122.63 mg/dL    Comment: Performed at [facility] [address], [place], [place] [address]  I-stat troponin, ED     Status: None   Collection Time: 09/01/18  3:09 PM  Result Value Ref Range   Troponin i, poc 0.01 0.00 - 0.08 ng/mL   Comment 3            Comment: Due to the release kinetics of cTnI, a negative result within the first hours of the onset of symptoms does not rule out myocardial infarction with certainty. If myocardial infarction is still suspected, repeat the test at appropriate intervals.   SARS Coronavirus 2 (CEPHEID - Performed in [facility] hospital lab), Hosp Order     Status: None   Collection Time: 09/01/18  3:44 PM  Result Value Ref Range   SARS Coronavirus 2 NEGATIVE NEGATIVE    Comment: (NOTE) If result is NEGATIVE SARS-CoV-2 target nucleic acids are NOT DETECTED. The SARS-CoV-2 RNA is generally detectable in upper and lower  respiratory specimens during the acute phase of infection. The lowest  concentration of SARS-CoV-2 viral copies this assay can detect is 250  copies / mL. A negative result does not preclude SARS-CoV-2 infection  and should not be used as the sole basis for treatment or other  patient management decisions.  A negative result may occur with  improper specimen collection / handling, submission of specimen other  than nasopharyngeal swab, presence of viral mutation(s) within the  areas targeted by this assay, and inadequate number of viral copies  (<250 copies / mL). A negative result must be combined  with clinical  observations, patient history, and epidemiological information. If result is POSITIVE SARS-CoV-2 target nucleic acids are DETECTED. The SARS-CoV-2 RNA is generally detectable in upper and lower  respiratory specimens dur ing the acute phase of infection.  Positive  results are indicative of active infection with SARS-CoV-2.  Clinical  correlation with patient history and other diagnostic information is  necessary to determine patient infection status.  Positive results do  not rule out bacterial infection or co-infection with other viruses. If result is PRESUMPTIVE POSTIVE SARS-CoV-2 nucleic acids MAY BE PRESENT.   A presumptive positive result was obtained on the submitted specimen  and confirmed on repeat testing.  While 2019 novel coronavirus  (SARS-CoV-2) nucleic acids may be present in the submitted sample  additional confirmatory testing may be necessary for epidemiological  and / or clinical management purposes  to differentiate between  SARS-CoV-2 and other Sarbecovirus currently known to infect humans.  If clinically indicated additional testing with an alternate test  methodology [phone]) is advised. The SARS-CoV-2 RNA is generally  detectable in upper and lower respiratory sp ecimens during the acute  phase of infection. The expected result is Negative. Fact Sheet for Patients:  StrictlyIdeas.no Fact Sheet for Healthcare Providers: BankingDealers.co.za This test is not yet approved or cleared by the [place] FDA and  has been authorized for detection and/or diagnosis of SARS-CoV-2 by FDA under an Emergency Use Authorization (EUA).  This EUA will remain in effect (meaning this test can be used) for the duration of the COVID-19 declaration under Section 564(b)(1) of the Act, 21 U.S.C. section 360bbb-3(b)(1), unless the authorization is terminated or revoked sooner. Performed at [facility], [place] [address] [place], [place] [address]    Ct Abdomen Pelvis Wo Contrast  Result Date: 09/01/2018 CLINICAL DATA:  [place], left lower extremity pain EXAM: CT CHEST, ABDOMEN AND PELVIS WITHOUT CONTRAST TECHNIQUE: Multidetector CT imaging of the chest, abdomen and pelvis was performed following the standard protocol without IV contrast. COMPARISON:  06/20/2017 and previous FINDINGS: CT CHEST FINDINGS Cardiovascular: Coronary and Aortic Atherosclerosis (ICD10-170.0). Tortuous descending thoracic aorta. Heart size normal. No pericardial effusion. Mediastinum/Nodes: Large hiatal hernia involving virtually the entire stomach. No hilar or mediastinal adenopathy. No mediastinal hematoma. Lungs/Pleura: No pneumothorax. Coarse peripheral airspace opacities. Significant improvement in the pleural effusions seen previously with minimal trace left residual. Improved aeration in the lung bases with scattered coarse airspace opacities in the posterior right lower lobe and medial left lower lobe with atelectasis. Musculoskeletal: No acute fracture. Thoracic dextroscoliosis with anterior vertebral endplate spurring at multiple levels in the mid and lower thoracic spine. Bilateral shoulder DJD. CT ABDOMEN PELVIS FINDINGS Hepatobiliary: No focal liver abnormality is seen. No gallstones, gallbladder wall thickening, or biliary dilatation. Pancreas: Unremarkable. No pancreatic ductal dilatation or surrounding inflammatory changes. Spleen: Normal in size without focal abnormality. Adrenals/Urinary Tract: No evidence of adrenal mass. No hydronephrosis. No evidence of renal mass. Urinary bladder physiologically distended. Stomach/Bowel: Virtually the entire stomach is involved in a large hiatal hernia. Small bowel is nondilated. Normal appendix. Scattered colonic diverticula most numerous in the distal descending and sigmoid segments, without significant adjacent inflammatory/edematous change or abscess. Vascular/Lymphatic: Aortoiliac arterial  calcifications without aneurysm. No abdominal or pelvic adenopathy. Reproductive: Uterus and bilateral adnexa are unremarkable. Other: No ascites. Left pelvic phleboliths. No free air. Musculoskeletal: Subcapital left femoral neck fracture with impaction and displacement, resulting varus deformity. No pelvic ring fracture. Mild levoscoliosis in the lumbar spine with multilevel spondylitic change. IMPRESSION: 1. Left subcapital femoral neck fracture with impaction and displacement. 2. Large hiatal hernia involving the entire stomach. 3. Coronary and Aortic Atherosclerosis (ICD10-I70.0). 4. Colonic diverticulosis. Electronically Signed   By: Lucrezia Europe M.D.   On: 09/01/2018 16:23   Ct Head Wo Contrast  Result Date: 09/01/2018 CLINICAL DATA:  Fall. EXAM: CT HEAD WITHOUT CONTRAST CT CERVICAL SPINE WITHOUT CONTRAST TECHNIQUE: Multidetector CT imaging of the head and cervical spine was performed following the standard protocol without intravenous contrast. Multiplanar CT image reconstructions of the cervical spine were also generated. COMPARISON:  06/13/2017 FINDINGS: CT HEAD FINDINGS Brain: No acute infarct, intracranial hemorrhage, mass effect, or extra-axial fluid collection is identified. Cerebral white matter hypodensities are similar to the prior study and nonspecific but compatible with mild chronic small vessel ischemic disease. Mild cerebral atrophy is also unchanged. Vascular: Calcified atherosclerosis at the skull base. No hyperdense vessel. Skull: No fracture or suspicious osseous lesion. Sinuses/Orbits: Clear paranasal sinuses. Small chronic right mastoid effusion. Bilateral cataract extraction. Other: None. CT CERVICAL SPINE FINDINGS Alignment: Unchanged minimal retrolisthesis of C3 on C4 and C5 on C6 and minimal anterolisthesis of C6 on C7 and C7 on T1. Skull base and vertebrae: No acute fracture or suspicious osseous lesion. Mild median C1-2 arthropathy. Soft tissues and spinal canal: No prevertebral  fluid or swelling.  No visible canal hematoma. Disc levels: Similar appearance of multilevel disc degeneration including up to severe disc space narrowing from C3-4 to C6-7. Moderate multilevel neural foraminal stenosis due to uncovertebral spurring. Upper chest: Asymmetric right apical pleuroparenchymal scarring. Other: Diminutive or absent left thyroid lobe. Small hypodense nodules in the right thyroid lobe measuring up to approximately 1.2 cm, not significantly changed. Moderate calcified atherosclerosis at the carotid bifurcations. IMPRESSION: 1. No evidence of acute intracranial abnormality. 2. No evidence of acute fracture or traumatic subluxation in the cervical spine. Electronically Signed   By: Logan Bores M.D.   On: 09/01/2018 16:28   Ct Chest Wo Contrast  Result Date: 09/01/2018 CLINICAL DATA:  [place], left lower extremity pain EXAM: CT CHEST, ABDOMEN AND PELVIS WITHOUT CONTRAST TECHNIQUE: Multidetector CT imaging of the chest, abdomen and pelvis was performed following the standard protocol without IV contrast. COMPARISON:  06/20/2017 and previous FINDINGS: CT CHEST FINDINGS Cardiovascular: Coronary and Aortic Atherosclerosis (ICD10-170.0). Tortuous descending thoracic aorta. Heart size normal. No pericardial effusion. Mediastinum/Nodes: Large hiatal hernia involving virtually the entire stomach. No hilar or mediastinal adenopathy. No mediastinal hematoma. Lungs/Pleura: No pneumothorax. Coarse peripheral airspace opacities. Significant improvement in the pleural effusions seen previously with minimal trace left residual. Improved aeration in the lung bases with scattered coarse airspace opacities in the posterior right lower lobe and medial left lower lobe with atelectasis. Musculoskeletal: No acute fracture. Thoracic dextroscoliosis with anterior vertebral endplate spurring at multiple levels in the mid and lower thoracic spine. Bilateral shoulder DJD. CT ABDOMEN PELVIS FINDINGS Hepatobiliary: No focal  liver abnormality is seen. No gallstones, gallbladder wall thickening, or biliary dilatation. Pancreas: Unremarkable. No pancreatic ductal dilatation or surrounding inflammatory changes. Spleen: Normal in size without focal abnormality. Adrenals/Urinary Tract: No evidence of adrenal mass. No hydronephrosis. No evidence of renal mass. Urinary bladder physiologically distended. Stomach/Bowel: Virtually the entire stomach is involved in a large hiatal hernia. Small bowel is nondilated. Normal appendix. Scattered colonic diverticula most numerous in the distal descending and sigmoid segments, without significant adjacent inflammatory/edematous change or abscess. Vascular/Lymphatic: Aortoiliac arterial calcifications without aneurysm. No abdominal or pelvic adenopathy. Reproductive: Uterus and bilateral adnexa are unremarkable. Other: No ascites. Left pelvic phleboliths. No free air. Musculoskeletal: Subcapital left femoral neck fracture with impaction and displacement, resulting varus deformity. No pelvic ring fracture. Mild levoscoliosis in the lumbar spine with multilevel spondylitic change. IMPRESSION: 1. Left subcapital femoral neck fracture with impaction and displacement. 2. Large hiatal hernia involving the entire stomach. 3. Coronary and Aortic Atherosclerosis (ICD10-I70.0). 4. Colonic diverticulosis. Electronically Signed   By: Lucrezia Europe M.D.   On: 09/01/2018 16:23   Ct Cervical Spine Wo Contrast  Result Date: 09/01/2018 CLINICAL DATA:  Fall. EXAM: CT HEAD WITHOUT CONTRAST CT CERVICAL SPINE WITHOUT CONTRAST TECHNIQUE: Multidetector CT imaging of the head and cervical spine was performed following the standard protocol without intravenous contrast. Multiplanar CT image reconstructions of the cervical spine were also generated. COMPARISON:  06/13/2017 FINDINGS: CT HEAD FINDINGS Brain: No acute infarct, intracranial hemorrhage, mass effect, or extra-axial fluid collection is identified. Cerebral white matter  hypodensities are similar to the prior study and nonspecific but compatible with mild chronic small vessel ischemic disease. Mild cerebral atrophy is also unchanged. Vascular: Calcified atherosclerosis at the skull base. No hyperdense vessel. Skull: No fracture or suspicious osseous lesion. Sinuses/Orbits: Clear paranasal sinuses. Small chronic right mastoid effusion. Bilateral cataract extraction. Other: None. CT CERVICAL SPINE FINDINGS Alignment: Unchanged minimal retrolisthesis of C3 on C4 and C5 on C6 and  minimal anterolisthesis of C6 on C7 and C7 on T1. Skull base and vertebrae: No acute fracture or suspicious osseous lesion. Mild median C1-2 arthropathy. Soft tissues and spinal canal: No prevertebral fluid or swelling. No visible canal hematoma. Disc levels: Similar appearance of multilevel disc degeneration including up to severe disc space narrowing from C3-4 to C6-7. Moderate multilevel neural foraminal stenosis due to uncovertebral spurring. Upper chest: Asymmetric right apical pleuroparenchymal scarring. Other: Diminutive or absent left thyroid lobe. Small hypodense nodules in the right thyroid lobe measuring up to approximately 1.2 cm, not significantly changed. Moderate calcified atherosclerosis at the carotid bifurcations. IMPRESSION: 1. No evidence of acute intracranial abnormality. 2. No evidence of acute fracture or traumatic subluxation in the cervical spine. Electronically Signed   By: Logan Bores M.D.   On: 09/01/2018 16:28   Dg Chest Port 1 View  Result Date: 09/01/2018 CLINICAL DATA:  Fall. EXAM: PORTABLE CHEST 1 VIEW COMPARISON:  June 20, 2017. FINDINGS: There is a large hiatal hernia again identified. The heart, hila, mediastinum, pleura are unremarkable. There are small pleural effusions, left greater than right. Mild interstitial prominence without overt edema. No new infiltrates. IMPRESSION: 1. Small effusions, left greater than right. 2. Large hiatal hernia. 3. Pulmonary venous  congestion without overt edema. Electronically Signed   By: Dorise Bullion III M.D   On: 09/01/2018 15:27   Dg Hip Unilat W Or Wo Pelvis 2-3 Views Left  Result Date: 09/01/2018 CLINICAL DATA:  Status post fall, left hip fracture EXAM: DG HIP (WITH OR WITHOUT PELVIS) 2-3V LEFT COMPARISON:  None. FINDINGS: Severe osteopenia. Displaced left subcapital femoral neck fracture.  No dislocation. No other acute fracture or dislocation. IMPRESSION: 1. Acute displaced left subcapital femoral neck fracture. Electronically Signed   By: Kathreen Devoid   On: 09/01/2018 15:28    Pending Labs Unresulted Labs (From admission, onward)    Start     Ordered   09/02/18 0500  CBC  Tomorrow morning,   R     09/01/18 1743   09/02/18 0500  Protime-INR  Tomorrow morning,   R     09/01/18 1743   09/02/18 0814  Basic metabolic panel  Tomorrow morning,   R     09/01/18 1743          Vitals/Pain Today's Vitals   09/01/18 1730 09/01/18 1800 09/01/18 1830 09/01/18 1900  BP: 134/84 123/82 136/85 (!) 127/99  Pulse: 94 97 96 (!) 107  Resp: 16 (!) 22 18 (!) 22  Temp:    97.8 F (36.6 C)  TempSrc:    Oral  SpO2: 92% 93% 92% 93%  Weight:      Height:        Isolation Precautions No active isolations  Medications Medications  acetaminophen (TYLENOL) tablet 650 mg (has no administration in time range)    Or  acetaminophen (TYLENOL) suppository 650 mg (has no administration in time range)  oxyCODONE (Oxy IR/ROXICODONE) immediate release tablet 5 mg (has no administration in time range)  traZODone (DESYREL) tablet 25 mg (has no administration in time range)  polyethylene glycol (MIRALAX / GLYCOLAX) packet 17 g (has no administration in time range)  ondansetron (ZOFRAN) tablet 4 mg (has no administration in time range)    Or  ondansetron (ZOFRAN) injection 4 mg (has no administration in time range)  albuterol (PROVENTIL) (2.5 MG/3ML) 0.083% nebulizer solution 2.5 mg (has no administration in time range)   dextrose 5 %-0.45 % sodium chloride infusion ( Intravenous New  Bag/Given 09/01/18 1752)  HYDROmorphone (DILAUDID) injection 0.5 mg (0.5 mg Intravenous Given 09/01/18 1834)  escitalopram (LEXAPRO) tablet 10 mg (has no administration in time range)  LORazepam (ATIVAN) tablet 0.5 mg (has no administration in time range)  OLANZapine (ZYPREXA) tablet 2.5 mg (has no administration in time range)  OLANZapine (ZYPREXA) tablet 7.5 mg (has no administration in time range)  levothyroxine (SYNTHROID) tablet 100 mcg (has no administration in time range)  Melatonin TABS 7.5 mg (has no administration in time range)  divalproex (DEPAKOTE SPRINKLE) capsule 125 mg (has no administration in time range)  ketotifen (ZADITOR) 0.025 % ophthalmic solution 1 drop (has no administration in time range)  polyvinyl alcohol (LIQUIFILM TEARS) 1.4 % ophthalmic solution 1 drop (has no administration in time range)  artificial tears ophthalmic ointment 1 application (has no administration in time range)  fentaNYL (SUBLIMAZE) injection 50 mcg (50 mcg Intravenous Given 09/01/18 1456)  sodium chloride 0.9 % bolus 500 mL (0 mLs Intravenous Stopped 09/01/18 1729)    Mobility non-ambulatory High fall risk   Focused Assessments Cardiac Assessment Handoff:  Cardiac Rhythm: Normal sinus rhythm Lab Results  Component Value Date   TROPONINI <0.03 06/28/2014   No results found for: DDIMER Does the Patient currently have chest pain? No  , Neuro Assessment Handoff:  Swallow screen pass? n/a Cardiac Rhythm: Normal sinus rhythm       Neuro Assessment:   Neuro Checks:      Last Documented NIHSS Modified Score:   Has TPA been given? No If patient is a Neuro Trauma and patient is going to OR before floor call report to [place] nurse: [phone] or [phone]     R Recommendations: See Admitting Provider Note  Report given to:   Additional Notes:

## 2018-09-01 NOTE — ED Notes (Signed)
Patient transported to X-ray 

## 2018-09-01 NOTE — Consult Note (Signed)
ORTHOPAEDIC CONSULTATION  REQUESTING PHYSICIAN: Mercy Riding, MD  Chief Complaint: Left hip pain  HPI: Cristina Middleton is a 84 y.o. female who complains of left hip pain after an unwitnessed fall.  Patient is in a memory care unit.  She apparently presented brought by EMS unable to walk.  She also had some hypoxia upon presentation, 87% on room air.  History is limited secondary to advanced dementia.  She did have a knee replacement done by Dr. French Ana in 2014.   Past Medical History:  Diagnosis Date  . Anxiety   . Arthritis   . Diabetes mellitus without complication ([place])   . Dislocated shoulder    WAS POPPED BACK IN PLACE  2006???  . GERD (gastroesophageal reflux disease)    TAKES  ACIPHEX  . Hyperlipidemia   . Hypertension   . Hyperthyroidism    NOT SURE WHICH ONE  . PONV (postoperative nausea and vomiting)    YRS AGO.....(6-7 YRS)  . Sleep apnea    WEARS MASK EVERY NOW AND THEN   Past Surgical History:  Procedure Laterality Date  . BREAST SURGERY     CYST REMOVED -- LEFT  . ENDOVENOUS ABLATION SAPHENOUS VEIN W/ LASER Left 07/11/2016   endovenous laser ablation left greater saphenous vein by Tinnie Gens MD  . [place]  . LIPOMA EXCISION     FROM LEFT BACK  . TONSILLECTOMY    . TOTAL KNEE ARTHROPLASTY  05/25/2012   right knee  . TOTAL KNEE ARTHROPLASTY  05/25/2012   Procedure: TOTAL KNEE ARTHROPLASTY;  Surgeon: Yvette Rack., MD;  Location: [facility];  Service: Orthopedics;  Laterality: Right;   Social History   Socioeconomic History  . Marital status: Widowed    Spouse name: Not on file  . Number of children: Not on file  . Years of education: Not on file  . Highest education level: Not on file  Occupational History  . Not on file  Social Needs  . Financial resource strain: Not on file  . Food insecurity:    Worry: Not on file    Inability: Not on file  . Transportation needs:    Medical: Not on file    Non-medical: Not on file   Tobacco Use  . Smoking status: Former Smoker    Packs/day: 1.00    Types: Cigarettes    Last attempt to quit: 05/02/1998    Years since quitting: 20.3  . Smokeless tobacco: Never Used  Substance and Sexual Activity  . Alcohol use: No  . Drug use: No  . Sexual activity: Never  Lifestyle  . Physical activity:    Days per week: Not on file    Minutes per session: Not on file  . Stress: Not on file  Relationships  . Social connections:    Talks on phone: Not on file    Gets together: Not on file    Attends religious service: Not on file    Active member of club or organization: Not on file    Attends meetings of clubs or organizations: Not on file    Relationship status: Not on file  Other Topics Concern  . Not on file  Social History Narrative  . Not on file   Family History  Problem Relation Age of Onset  . Other Mother   . Other Father   . Breast cancer Sister   . Colon cancer Sister   . CAD Sister   .  Marfan syndrome Sister   . CAD Brother   . Marfan syndrome Brother    Allergies  Allergen Reactions  . Penicillins Hives, Itching, Swelling and Other (See Comments)    Reaction:  Unspecified swelling reaction Has patient had a PCN reaction causing immediate rash, facial/tongue/throat swelling, SOB or lightheadedness with hypotension: Yes Has patient had a PCN reaction causing severe rash involving mucus membranes or skin necrosis: No Has patient had a PCN reaction that required hospitalization No Has patient had a PCN reaction occurring within the last 10 years: No If all of the above answers are "NO", then may proceed with Cephalosporin use.  . Sulfa Antibiotics Hives, Itching, Swelling and Other (See Comments)    Reaction:  Unspecified swelling reaction  . Alka-Seltzer [Aspirin Effervescent] Other (See Comments)    Hallucinations     Positive ROS: All other systems have been reviewed and were otherwise negative with the exception of those mentioned in the HPI  and as above.  Physical Exam:  BP 123/82   Pulse 97   Temp (!) 95 F (35 C) (Rectal)   Resp (!) 22   Ht 5\' 4"  (1.626 m)   Wt 72.6 kg   SpO2 93%   BMI 27.46 kg/m   General: Alert, no acute distress Cardiovascular: No pedal edema Respiratory: Mild use of accessory musculature, no true cyanosis. GI: No organomegaly, abdomen is soft and non-tender Skin: No lesions in the area of chief complaint Neurologic: Sensation intact distally Psychiatric: Patient has advanced dementia, and interacts with me, but is very confused and is incapable of understanding the current situation. Lymphatic: No axillary or cervical lymphadenopathy  MUSCULOSKELETAL: Left leg is shortened, externally rotated, EHL intact, she does follow commands, positive logroll.  Her other extremities are atraumatic from what I can tell.  Assessment: Principal Problem:   Closed left hip fracture (HCC) Active Problems:   HTN (hypertension)   Diabetes mellitus, type 2 (HCC)   Sleep apnea   Hiatal hernia with GERD   Hypothyroidism   Fall at nursing home   Osteopenia   Hypoxemia   AKI (acute kidney injury) ([place])   Leukopenia   Thrombocytopenia (HCC)  Advanced dementia  Plan: This is an acute severe injury, and carries risks for both morbidity and mortality.  I have begun a discussion with her daughter, Cristina Middleton, who has indicated that she would like a more detailed conversation with her other power of attorney, and I have made arrangements to perform this later tonight.  There are both surgical and nonsurgical options, if surgery then we would plan for hip hemiarthroplasty.  If nonsurgical then we would plan for palliative care.  She is planning on being evaluated by the hospitalist service, admitted, make sure that her pulmonary function is optimized, and then possible surgery tomorrow if optimized and the family elects for surgical management.    Johnny Bridge, MD Cell [phone]   09/01/2018 6:20 PM

## 2018-09-01 NOTE — ED Provider Notes (Addendum)
[facility] EMERGENCY DEPARTMENT Provider Note   CSN: [id number] Arrival date & time: 09/01/18  1420    History   Chief Complaint Chief Complaint  Patient presents with  . Fall    HPI Cristina Middleton is a 85 y.o. female hx of dementia, DM, presenting with fall.  Patient is currently residing at [facility] unit.  Patient is demented and unable to give me much history.  Patient apparently usually walks with a walker but had an unwitnessed fall around 12:30 PM.  Staff found her on the floor and has obvious left hip deformity.  She was also noted to be hypoxic to 85% per EMS and is not currently on oxygen at the facility.  Patient had no reported cough or shortness of breath.  Patient unable to give me much history at all due to dementia.  Patient is not currently on blood thinners per the records from the nursing home.     The history is provided by the EMS personnel.  Level V caveat- dementia, condition of patient   Past Medical History:  Diagnosis Date  . Anxiety   . Arthritis   . Diabetes mellitus without complication ([place])   . Dislocated shoulder    WAS POPPED BACK IN PLACE  2006???  . GERD (gastroesophageal reflux disease)    TAKES  ACIPHEX  . Hyperlipidemia   . Hypertension   . Hyperthyroidism    NOT SURE WHICH ONE  . PONV (postoperative nausea and vomiting)    YRS AGO.....(6-7 YRS)  . Sleep apnea    WEARS MASK EVERY NOW AND THEN    Patient Active Problem List   Diagnosis Date Noted  . Acute delirium   . Comfort measures only status   . Dementia associated with other underlying disease without behavioral disturbance ([place])   . Palliative care encounter   . Pressure injury of skin 09/02/2018  . Closed left hip fracture ([place]) 09/01/2018  . Fall at nursing home 09/01/2018  . Osteopenia 09/01/2018  . Hypoxemia 09/01/2018  . AKI (acute kidney injury) ([place]) 09/01/2018  . Leukopenia 09/01/2018  . Thrombocytopenia ([place]) 09/01/2018  .  Altered mental status   . Fall   . Fever   . CHF (congestive heart failure) ([place])   . Goals of care, counseling/discussion   . Palliative care by specialist   . SIRS (systemic inflammatory response syndrome) ([place]) 06/14/2017  . Macrocytic anemia 06/14/2017  . Multiple falls 06/14/2017  . Adjustment disorder with mixed disturbance of emotions and conduct 05/07/2017  . Vascular dementia of acute onset with behavioral disturbance ([place]) 05/07/2017  . Anxiety 12/09/2016  . Hyperlipidemia 12/09/2016  . Acute pancreatitis 12/08/2016  . GIB (gastrointestinal bleeding) 06/13/2016  . Bright red blood per rectum 06/13/2016  . Influenza with respiratory manifestation 06/13/2016  . Acute respiratory failure with hypoxia ([place]) 06/13/2016  . Reactive airway disease 06/13/2016  . Varicose veins of left lower extremity with complications 84/16/6063  . SOB (shortness of breath) 06/28/2014  . Abdominal pain 06/28/2014  . Encounter for nasogastric (NG) tube placement   . SBO (small bowel obstruction) ([place]) 06/27/2014  . Diabetes mellitus without complication ([place]) 01/60/1093  . Acute blood loss anemia 05/26/2012  . HTN (hypertension) 05/25/2012  . Diabetes mellitus, type 2 ([place]) 05/25/2012  . Osteoarthritis of right knee 05/25/2012  . Sleep apnea 05/25/2012  . Hiatal hernia with GERD 05/25/2012  . Hypothyroidism 05/25/2012    Past Surgical History:  Procedure Laterality Date  .  BREAST SURGERY     CYST REMOVED -- LEFT  . ENDOVENOUS ABLATION SAPHENOUS VEIN W/ LASER Left 07/11/2016   endovenous laser ablation left greater saphenous vein by Tinnie Gens MD  . [place]  . LIPOMA EXCISION     FROM LEFT BACK  . TONSILLECTOMY    . TOTAL KNEE ARTHROPLASTY  05/25/2012   right knee  . TOTAL KNEE ARTHROPLASTY  05/25/2012   Procedure: TOTAL KNEE ARTHROPLASTY;  Surgeon: Yvette Rack., MD;  Location: [facility];  Service: Orthopedics;  Laterality: Right;     OB History   No obstetric  history on file.      Home Medications    Prior to Admission medications   Medication Sig Start Date End Date Taking? Authorizing Provider  acetaminophen (TYLENOL) 650 MG CR tablet Take 650 mg by mouth 2 (two) times a day.   Yes [provider]  bisacodyl (DULCOLAX) 10 MG suppository Place 10 mg rectally daily as needed for moderate constipation.   Yes [provider]  divalproex (DEPAKOTE SPRINKLE) 125 MG capsule Take 125 mg by mouth 3 (three) times daily. 06/06/17 09/01/18 Yes [provider]  docusate sodium (COLACE) 100 MG capsule Take 100 mg by mouth 2 (two) times daily.   Yes [provider]  loratadine (CLARITIN) 10 MG tablet Take 10 mg by mouth daily as needed for allergies.    Yes [provider]  LORazepam (ATIVAN) 0.5 MG tablet Take 1 tablet (0.5 mg total) by mouth daily as needed for anxiety. Patient taking differently: Take 0.5 mg by mouth every 8 (eight) hours as needed for anxiety.  06/22/17  Yes Hongalgi, Lenis Dickinson, MD  MELATONIN PO Take 7.5 mg by mouth at bedtime.   Yes [provider]  OLANZapine (ZYPREXA) 7.5 MG tablet Take 7.5 mg by mouth at bedtime.   Yes [provider]  polyethylene glycol (MIRALAX / GLYCOLAX) packet Take 17 g by mouth daily.    Yes [provider]  polyvinyl alcohol (LIQUIFILM TEARS) 1.4 % ophthalmic solution Place 1 drop into both eyes as needed for dry eyes.   Yes [provider]  Jay Schlichter Oil (ARTIFICIAL TEARS) OINT ophthalmic ointment Place 1 application into both eyes 2 (two) times a day.   Yes [provider]  haloperidol (HALDOL) 0.5 MG tablet Take 1 tablet (0.5 mg total) by mouth every 4 (four) hours as needed for agitation (or delirium). 09/05/18   Donne Hazel, MD  HYDROmorphone (DILAUDID) 1 MG/ML injection Inject 0.25 mLs (0.25 mg total) into the vein every 2 (two) hours as needed for severe pain (or dyspnea). 09/05/18   Donne Hazel, MD     Family History Family History  Problem Relation Age of Onset  . Other Mother   . Other Father   . Breast cancer Sister   . Colon cancer Sister   . CAD Sister   . Marfan syndrome Sister   . CAD Brother   . Marfan syndrome Brother     Social History Social History   Tobacco Use  . Smoking status: Former Smoker    Packs/day: 1.00    Types: Cigarettes    Last attempt to quit: 05/02/1998    Years since quitting: 20.3  . Smokeless tobacco: Never Used  Substance Use Topics  . Alcohol use: No  . Drug use: No     Allergies   Penicillins; Sulfa antibiotics; and Alka-seltzer [aspirin effervescent]  Review of Systems Review of Systems  Musculoskeletal:       L hip pain   All other systems reviewed and are negative.    Physical Exam Updated Vital Signs BP (!) 116/35 (BP Location: Left Arm)   Pulse 85   Temp 98.3 F (36.8 C) (Oral)   Resp (!) 25   Ht 5\' 4"  (1.626 m)   Wt 72.6 kg   SpO2 (!) 84%   BMI 27.46 kg/m   Physical Exam Vitals signs and nursing note reviewed.  Constitutional:      Comments: Demented  HENT:     Head: Normocephalic.     Comments: No obvious scalp hematoma.     Mouth/Throat:     Mouth: Mucous membranes are moist.  Eyes:     Extraocular Movements: Extraocular movements intact.     Pupils: Pupils are equal, round, and reactive to light.  Neck:     Comments: C collar in place  Cardiovascular:     Rate and Rhythm: Normal rate and regular rhythm.     Pulses: Normal pulses.     Heart sounds: Normal heart sounds.  Pulmonary:     Effort: Pulmonary effort is normal.     Breath sounds: Normal breath sounds.  Abdominal:     General: Abdomen is flat.     Palpations: Abdomen is soft.  Musculoskeletal:     Comments: No obvious midline spinal tenderness. Obvious L hip deformity and L hip shortened and internally rotated   Skin:    Capillary Refill: Capillary refill takes less than 2 seconds.  Neurological:     General: No focal deficit  present.  Psychiatric:        Mood and Affect: Mood normal.      ED Treatments / Results  Labs (all labs ordered are listed, but only abnormal results are displayed) Labs Reviewed  SURGICAL PCR SCREEN - Abnormal; Notable for the following components:      Result Value   MRSA, PCR POSITIVE (*)    Staphylococcus aureus POSITIVE (*)    All other components within normal limits  CBC WITH DIFFERENTIAL/PLATELET - Abnormal; Notable for the following components:   WBC 2.9 (*)    MCV 102.0 (*)    Platelets 140 (*)    Neutro Abs 1.6 (*)    All other components within normal limits  COMPREHENSIVE METABOLIC PANEL - Abnormal; Notable for the following components:   Chloride 97 (*)    BUN 40 (*)    Creatinine, Ser 1.45 (*)    GFR calc non Af Amer 31 (*)    GFR calc Af Amer 35 (*)    All other components within normal limits  CBC - Abnormal; Notable for the following components:   RBC 3.68 (*)    MCV 101.4 (*)    Platelets 130 (*)    All other components within normal limits  BASIC METABOLIC PANEL - Abnormal; Notable for the following components:   Glucose, Bld 186 (*)    BUN 42 (*)    Creatinine, Ser 1.31 (*)    GFR calc non Af Amer 35 (*)    GFR calc Af Amer 40 (*)    All other components within normal limits  HEMOGLOBIN A1C - Abnormal; Notable for the following components:   Hgb A1c MFr Bld 5.9 (*)    All other components within normal limits  SARS CORONAVIRUS 2 (HOSPITAL ORDER, [place] [facility])  PROTIME-INR  PROTIME-INR  I-STAT  TROPONIN, ED  TYPE AND SCREEN    EKG ED ECG REPORT   Date: 09/01/2018  Rate: 112  Rhythm: normal sinus rhythm  QRS Axis: normal  Intervals: normal  ST/T Wave abnormalities: nonspecific ST changes  Conduction Disutrbances:none  Narrative Interpretation:   Old EKG Reviewed: rate faster  I have personally reviewed the EKG tracing and agree with the computerized printout as noted.   Radiology No results found.   Procedures Procedures (including critical care time)  CRITICAL CARE Performed by: Wandra Arthurs   Total critical care time: 30 minutes  Critical care time was exclusive of separately billable procedures and treating other patients.  Critical care was necessary to treat or prevent imminent or life-threatening deterioration.  Critical care was time spent personally by me on the following activities: development of treatment plan with patient and/or surrogate as well as nursing, discussions with consultants, evaluation of patient's response to treatment, examination of patient, obtaining history from patient or surrogate, ordering and performing treatments and interventions, ordering and review of laboratory studies, ordering and review of radiographic studies, pulse oximetry and re-evaluation of patient's condition.   Medications Ordered in ED Medications  ceFAZolin (ANCEF) IVPB 2g/100 mL premix (has no administration in time range)  tranexamic acid (CYKLOKAPRON) IVPB 1,000 mg (has no administration in time range)  fentaNYL (SUBLIMAZE) injection 50 mcg (50 mcg Intravenous Given 09/01/18 1456)  sodium chloride 0.9 % bolus 500 mL (0 mLs Intravenous Stopped 09/01/18 1729)     Initial Impression / Assessment and Plan / ED Course  I have reviewed the triage vital signs and the nursing notes.  Pertinent labs & imaging results that were available during my care of the patient were reviewed by me and considered in my medical decision making (see chart for details).       Cristina Middleton is a 85 y.o. female here presenting with fall.  Patient had an unwitnessed fall and has obvious deformity to the left hip.  Not sure if she syncopized or not.  She is also hypoxic concerned for possible hemopneumothorax.  Will obtain trauma scan as well as x-rays of the left hip.   4:30 pm X-ray showed left hip fracture.  Trauma scan is negative except she has a large hiatal hernia which likely contribute to her  hypoxia.  However there is no hemothorax or pneumothorax.  COVID testing pending. Dr. Mardelle Matte to see patient. Hospitalist to admit.   Final Clinical Impressions(s) / ED Diagnoses   Final diagnoses:  Closed fracture of left hip, initial encounter [facility])  Hypoxia  Hiatal hernia    ED Discharge Orders         Ordered    HYDROmorphone (DILAUDID) 1 MG/ML injection  Every 2 hours PRN     09/05/18 1042    haloperidol (HALDOL) 0.5 MG tablet  Every 4 hours PRN     09/05/18 1042           Drenda Freeze, MD 09/01/18 1642    Drenda Freeze, MD 09/13/18 1104

## 2018-09-01 NOTE — Progress Notes (Signed)
I have had a family conference with the son and daughter, both of which are the Cristina Middleton, and they have both decided that they do wish for Cristina Middleton to undergo surgical intervention with hemiarthroplasty.  The risks benefits and alternatives were discussed with the patient including but not limited to the risks of nonoperative treatment, versus surgical intervention including infection, bleeding, nerve injury, periprosthetic fracture, the need for revision surgery, dislocation, leg length discrepancy, blood clots, cardiopulmonary complications, morbidity, mortality, among others, and they were willing to proceed.    Plan for surgery tomorrow morning.  She has had significant adverse reactions to narcotics and anesthesia and medications in the past with significant delirium.  We will plan to try and make it through with nonnarcotic management only if at all possible.   Johnny Bridge, MD

## 2018-09-01 NOTE — ED Notes (Signed)
Upon assessment, pt has shortening and rotation to left leg. Strong pedal pulses.

## 2018-09-01 NOTE — H&P (Signed)
History and Physical    Cristina Middleton QAS:[id number] DOB: 1920-06-10 DOA: 09/01/2018  PCP: Jani Gravel, MD Patient coming from: From [place] memory unit  Chief Complaint: Fall  HPI: Cristina Middleton is a 85 y.o. female with advanced dementia, DM-2, hypertension, OSA not on CPAP, hypothyroidism and anxiety presenting from [place] memory unit/ALF with fall.  Patient has advanced dementia and was not able to provide history. Per EDP note and patient's daughter, patient fell cemented past 12 PM when she was trying to reach for her walker.  Staff found her on the floor and had obvious left hip deformity and called EMS.  Found to be hypoxemic to 85% per EMS report to EDP.  She was also hypoxemic to 85% in ED. She easily recovered to upper 90s on 2 L by nasal cannula.  Per daughter, no recent illness or medication change she is aware of. Reportedly has no URI symptoms, cough or shortness of breath.  Not on blood thinner. Does not wear CPAP.  Walks with walker at baseline.  In ED, hemodynamically stable.  Hypoxemic to 85% and recovered to upper 90s on 2 L by nasal cannula.  CBC with leukopenia to 2.9 (new), macrocytosis to 102 and thrombocytopenia to 140 (baseline).  CMP significant for creatinine to 1.45 (baseline 0.67).  Troponin negative.  PT/INR within normal limits.  COVID-19 negative.  CT head and cervical spine negative for acute finding.  CXR with small effusion, large atrial hernia, PV congestion without overt edema.  DG left hip showed acute displaced left subcapital femoral neck fracture and severe osteopenia.  CT chest, abdomen and pelvis without contrast confirmed the fracture with impaction and displacement, large ureteral hernia involving the entire stomach and colonic diverticulosis. Orthopedic surgery Dr. Mardelle Matte consulted by EDP.  Typed and screened.  Received fentanyl and normal saline bolus 500 cc and hospital service was called for admission.  Reached out to patient's daughter, Ms.  Laymond Purser who is her healthcare power of attorney.  Updated her about the above finding.  Patient's daughter wants her mother to be DNR/DNI in case of cardiopulmonary arrest. She also has a living will stating that patient does not want aggressive life prolonging measures such as feeding tube.  Patient daughter will send over the living will to the hospital.   ROS Not able to obtain due to patient's advanced dementia. PMH Past Medical History:  Diagnosis Date   Anxiety    Arthritis    Diabetes mellitus without complication ([place])    Dislocated shoulder    WAS POPPED BACK IN PLACE  2006???   GERD (gastroesophageal reflux disease)    TAKES  ACIPHEX   Hyperlipidemia    Hypertension    Hyperthyroidism    NOT SURE WHICH ONE   PONV (postoperative nausea and vomiting)    YRS AGO.....(6-7 YRS)   Sleep apnea    WEARS MASK EVERY NOW AND THEN   PSH Past Surgical History:  Procedure Laterality Date   BREAST SURGERY     CYST REMOVED -- LEFT   ENDOVENOUS ABLATION SAPHENOUS VEIN W/ LASER Left 07/11/2016   endovenous laser ablation left greater saphenous vein by Tinnie Gens MD   EYE SURGERY     BILATERAL   LIPOMA EXCISION     FROM LEFT BACK   TONSILLECTOMY     TOTAL KNEE ARTHROPLASTY  05/25/2012   right knee   TOTAL KNEE ARTHROPLASTY  05/25/2012   Procedure: TOTAL KNEE ARTHROPLASTY;  Surgeon: Yvette Rack., MD;  Location: [facility];  Service: Orthopedics;  Laterality: Right;   Fam HX Family History  Problem Relation Age of Onset   Other Mother    Other Father    Breast cancer Sister    Colon cancer Sister    CAD Sister    Marfan syndrome Sister    CAD Brother    Marfan syndrome Brother    Social Hx  reports that she quit smoking about 20 years ago. Her smoking use included cigarettes. She smoked 1.00 pack per day. She has never used smokeless tobacco. She reports that she does not drink alcohol or use drugs.  Allergy Allergies  Allergen Reactions    Penicillins Hives, Itching, Swelling and Other (See Comments)    Reaction:  Unspecified swelling reaction Has patient had a PCN reaction causing immediate rash, facial/tongue/throat swelling, SOB or lightheadedness with hypotension: Yes Has patient had a PCN reaction causing severe rash involving mucus membranes or skin necrosis: No Has patient had a PCN reaction that required hospitalization No Has patient had a PCN reaction occurring within the last 10 years: No If all of the above answers are "NO", then may proceed with Cephalosporin use.   Sulfa Antibiotics Hives, Itching, Swelling and Other (See Comments)    Reaction:  Unspecified swelling reaction   Alka-Seltzer [Aspirin Effervescent] Other (See Comments)    Hallucinations   Home Meds Prior to Admission medications   Medication Sig Start Date End Date Taking? Authorizing Provider  acetaminophen (TYLENOL) 325 MG tablet Take 650 mg by mouth at bedtime.     [provider]  aspirin EC 81 MG tablet Take 81 mg by mouth daily.     [provider]  calcium carbonate (OSCAL) 1500 (600 Ca) MG TABS tablet Take 600 mg of elemental calcium by mouth daily.    [provider]  divalproex (DEPAKOTE SPRINKLE) 125 MG capsule Take 125 mg by mouth 3 (three) times daily. 06/06/17 07/06/17  [provider]  escitalopram (LEXAPRO) 10 MG tablet Take 10 mg by mouth daily.     [provider]  feeding supplement, ENSURE ENLIVE, (ENSURE ENLIVE) LIQD Take 237 mLs by mouth daily at 3 pm. 06/22/17   Hongalgi, Lenis Dickinson, MD  lansoprazole (PREVACID) 3 mg/ml SUSP oral suspension Take 10 mLs (30 mg total) by mouth daily at 12 noon. 06/22/17   Hongalgi, Lenis Dickinson, MD  levothyroxine (SYNTHROID, LEVOTHROID) 100 MCG tablet Take 100 mcg by mouth daily before breakfast.    [provider]  loratadine (CLARITIN) 10 MG tablet Take 10 mg by mouth daily as needed for allergies.     [provider]  LORazepam (ATIVAN) 0.5 MG  tablet Take 1 tablet (0.5 mg total) by mouth daily as needed for anxiety. 06/22/17   Hongalgi, Lenis Dickinson, MD  OLANZapine (ZYPREXA) 5 MG tablet Take 5 mg by mouth 2 (two) times daily.  06/07/17   [provider]  polyethylene glycol (MIRALAX / GLYCOLAX) packet Take 17 g by mouth daily as needed for moderate constipation.    [provider]  polyvinyl alcohol (LIQUIFILM TEARS) 1.4 % ophthalmic solution Place 1 drop into both eyes as needed for dry eyes.    [provider]  simvastatin (ZOCOR) 20 MG tablet Take 20 mg by mouth at bedtime.     [provider]  traZODone (DESYREL) 100 MG tablet Take 100 mg by mouth at bedtime. 06/07/17   [provider]  vitamin B-12 (CYANOCOBALAMIN) 500 MCG tablet Take 1,000 mcg  by mouth daily.     [provider]    Physical Exam: Vitals:   09/01/18 1530 09/01/18 1614 09/01/18 1700 09/01/18 1730  BP: 136/73 136/73 128/76 134/84  Pulse: 81 89 90 94  Resp: 14 20 18 16   Temp:      TempSrc:      SpO2: 95% 98% 96% 92%  Weight:      Height:        Constitutional -elderly female.  No acute distress. Eyes - vision grossly intact. Sclera anicteric.  PERRLA. Nose - no gross deformity or drainage Mouth - no oral lesions noted Throat - no swelling or erythema CV -RRR.. (+)S1S2, no murmurs; no JVD or peripheral edema Resp - No increased work of breathing, good aeration bilaterally, no wheeze or crackles GI - (+)BS, soft, non-tender, non-distended MSK -some length discrepancy LLE shorter than RLE although there is a fold of blanket under her LLE that could contribute.  Neurovascular intact. Skin - no obvious rashes or lesions Neuro - alert, aware, but not oriented.  No focal neuro deficit.  Not able to respond to my questions appropriately. Psych - calm but disoriented.  Labs on Admission: I have personally reviewed following labs and imaging studies  CBC: Recent Labs  Lab 09/01/18 1501  WBC 2.9*  NEUTROABS 1.6*    HGB 13.2  HCT 40.1  MCV 102.0*  PLT 956*   Basic Metabolic Panel: Recent Labs  Lab 09/01/18 1501  NA 139  K 5.0  CL 97*  CO2 28  GLUCOSE 93  BUN 40*  CREATININE 1.45*  CALCIUM 9.6   GFR: Estimated Creatinine Clearance: 22.7 mL/min (A) (by C-G formula based on SCr of 1.45 mg/dL (H)). Liver Function Tests: Recent Labs  Lab 09/01/18 1501  AST 20  ALT 19  ALKPHOS 80  BILITOT 0.5  PROT 6.8  ALBUMIN 4.1   No results for input(s): LIPASE, AMYLASE in the last 168 hours. No results for input(s): AMMONIA in the last 168 hours. Coagulation Profile: Recent Labs  Lab 09/01/18 1501  INR 0.9   Cardiac Enzymes: No results for input(s): CKTOTAL, CKMB, CKMBINDEX, TROPONINI in the last 168 hours. BNP (last 3 results) No results for input(s): PROBNP in the last 8760 hours. HbA1C: No results for input(s): HGBA1C in the last 72 hours. CBG: No results for input(s): GLUCAP in the last 168 hours. Lipid Profile: No results for input(s): CHOL, HDL, LDLCALC, TRIG, CHOLHDL, LDLDIRECT in the last 72 hours. Thyroid Function Tests: No results for input(s): TSH, T4TOTAL, FREET4, T3FREE, THYROIDAB in the last 72 hours. Anemia Panel: No results for input(s): VITAMINB12, FOLATE, FERRITIN, TIBC, IRON, RETICCTPCT in the last 72 hours. Urine analysis:    Component Value Date/Time   COLORURINE YELLOW 06/13/2017 2356   APPEARANCEUR CLEAR 06/13/2017 2356   LABSPEC 1.025 06/13/2017 2356   PHURINE 5.0 06/13/2017 2356   GLUCOSEU NEGATIVE 06/13/2017 2356   HGBUR NEGATIVE 06/13/2017 2356   BILIRUBINUR NEGATIVE 06/13/2017 2356   KETONESUR NEGATIVE 06/13/2017 2356   PROTEINUR NEGATIVE 06/13/2017 2356   UROBILINOGEN 0.2 06/28/2014 0456   NITRITE NEGATIVE 06/13/2017 2356   LEUKOCYTESUR NEGATIVE 06/13/2017 2356    Sepsis Labs:  No leukocytosis  Radiological Exams on Admission: Ct Abdomen Pelvis Wo Contrast  Result Date: 09/01/2018 CLINICAL DATA:  [place], left lower extremity pain EXAM: CT  CHEST, ABDOMEN AND PELVIS WITHOUT CONTRAST TECHNIQUE: Multidetector CT imaging of the chest, abdomen and pelvis was performed following the standard protocol without IV contrast. COMPARISON:  06/20/2017  and previous FINDINGS: CT CHEST FINDINGS Cardiovascular: Coronary and Aortic Atherosclerosis (ICD10-170.0). Tortuous descending thoracic aorta. Heart size normal. No pericardial effusion. Mediastinum/Nodes: Large hiatal hernia involving virtually the entire stomach. No hilar or mediastinal adenopathy. No mediastinal hematoma. Lungs/Pleura: No pneumothorax. Coarse peripheral airspace opacities. Significant improvement in the pleural effusions seen previously with minimal trace left residual. Improved aeration in the lung bases with scattered coarse airspace opacities in the posterior right lower lobe and medial left lower lobe with atelectasis. Musculoskeletal: No acute fracture. Thoracic dextroscoliosis with anterior vertebral endplate spurring at multiple levels in the mid and lower thoracic spine. Bilateral shoulder DJD. CT ABDOMEN PELVIS FINDINGS Hepatobiliary: No focal liver abnormality is seen. No gallstones, gallbladder wall thickening, or biliary dilatation. Pancreas: Unremarkable. No pancreatic ductal dilatation or surrounding inflammatory changes. Spleen: Normal in size without focal abnormality. Adrenals/Urinary Tract: No evidence of adrenal mass. No hydronephrosis. No evidence of renal mass. Urinary bladder physiologically distended. Stomach/Bowel: Virtually the entire stomach is involved in a large hiatal hernia. Small bowel is nondilated. Normal appendix. Scattered colonic diverticula most numerous in the distal descending and sigmoid segments, without significant adjacent inflammatory/edematous change or abscess. Vascular/Lymphatic: Aortoiliac arterial calcifications without aneurysm. No abdominal or pelvic adenopathy. Reproductive: Uterus and bilateral adnexa are unremarkable. Other: No ascites. Left  pelvic phleboliths. No free air. Musculoskeletal: Subcapital left femoral neck fracture with impaction and displacement, resulting varus deformity. No pelvic ring fracture. Mild levoscoliosis in the lumbar spine with multilevel spondylitic change. IMPRESSION: 1. Left subcapital femoral neck fracture with impaction and displacement. 2. Large hiatal hernia involving the entire stomach. 3. Coronary and Aortic Atherosclerosis (ICD10-I70.0). 4. Colonic diverticulosis. Electronically Signed   By: Lucrezia Europe M.D.   On: 09/01/2018 16:23   Ct Head Wo Contrast  Result Date: 09/01/2018 CLINICAL DATA:  Fall. EXAM: CT HEAD WITHOUT CONTRAST CT CERVICAL SPINE WITHOUT CONTRAST TECHNIQUE: Multidetector CT imaging of the head and cervical spine was performed following the standard protocol without intravenous contrast. Multiplanar CT image reconstructions of the cervical spine were also generated. COMPARISON:  06/13/2017 FINDINGS: CT HEAD FINDINGS Brain: No acute infarct, intracranial hemorrhage, mass effect, or extra-axial fluid collection is identified. Cerebral white matter hypodensities are similar to the prior study and nonspecific but compatible with mild chronic small vessel ischemic disease. Mild cerebral atrophy is also unchanged. Vascular: Calcified atherosclerosis at the skull base. No hyperdense vessel. Skull: No fracture or suspicious osseous lesion. Sinuses/Orbits: Clear paranasal sinuses. Small chronic right mastoid effusion. Bilateral cataract extraction. Other: None. CT CERVICAL SPINE FINDINGS Alignment: Unchanged minimal retrolisthesis of C3 on C4 and C5 on C6 and minimal anterolisthesis of C6 on C7 and C7 on T1. Skull base and vertebrae: No acute fracture or suspicious osseous lesion. Mild median C1-2 arthropathy. Soft tissues and spinal canal: No prevertebral fluid or swelling. No visible canal hematoma. Disc levels: Similar appearance of multilevel disc degeneration including up to severe disc space narrowing  from C3-4 to C6-7. Moderate multilevel neural foraminal stenosis due to uncovertebral spurring. Upper chest: Asymmetric right apical pleuroparenchymal scarring. Other: Diminutive or absent left thyroid lobe. Small hypodense nodules in the right thyroid lobe measuring up to approximately 1.2 cm, not significantly changed. Moderate calcified atherosclerosis at the carotid bifurcations. IMPRESSION: 1. No evidence of acute intracranial abnormality. 2. No evidence of acute fracture or traumatic subluxation in the cervical spine. Electronically Signed   By: Logan Bores M.D.   On: 09/01/2018 16:28   Ct Chest Wo Contrast  Result Date: 09/01/2018 CLINICAL DATA:  [place],  left lower extremity pain EXAM: CT CHEST, ABDOMEN AND PELVIS WITHOUT CONTRAST TECHNIQUE: Multidetector CT imaging of the chest, abdomen and pelvis was performed following the standard protocol without IV contrast. COMPARISON:  06/20/2017 and previous FINDINGS: CT CHEST FINDINGS Cardiovascular: Coronary and Aortic Atherosclerosis (ICD10-170.0). Tortuous descending thoracic aorta. Heart size normal. No pericardial effusion. Mediastinum/Nodes: Large hiatal hernia involving virtually the entire stomach. No hilar or mediastinal adenopathy. No mediastinal hematoma. Lungs/Pleura: No pneumothorax. Coarse peripheral airspace opacities. Significant improvement in the pleural effusions seen previously with minimal trace left residual. Improved aeration in the lung bases with scattered coarse airspace opacities in the posterior right lower lobe and medial left lower lobe with atelectasis. Musculoskeletal: No acute fracture. Thoracic dextroscoliosis with anterior vertebral endplate spurring at multiple levels in the mid and lower thoracic spine. Bilateral shoulder DJD. CT ABDOMEN PELVIS FINDINGS Hepatobiliary: No focal liver abnormality is seen. No gallstones, gallbladder wall thickening, or biliary dilatation. Pancreas: Unremarkable. No pancreatic ductal dilatation or  surrounding inflammatory changes. Spleen: Normal in size without focal abnormality. Adrenals/Urinary Tract: No evidence of adrenal mass. No hydronephrosis. No evidence of renal mass. Urinary bladder physiologically distended. Stomach/Bowel: Virtually the entire stomach is involved in a large hiatal hernia. Small bowel is nondilated. Normal appendix. Scattered colonic diverticula most numerous in the distal descending and sigmoid segments, without significant adjacent inflammatory/edematous change or abscess. Vascular/Lymphatic: Aortoiliac arterial calcifications without aneurysm. No abdominal or pelvic adenopathy. Reproductive: Uterus and bilateral adnexa are unremarkable. Other: No ascites. Left pelvic phleboliths. No free air. Musculoskeletal: Subcapital left femoral neck fracture with impaction and displacement, resulting varus deformity. No pelvic ring fracture. Mild levoscoliosis in the lumbar spine with multilevel spondylitic change. IMPRESSION: 1. Left subcapital femoral neck fracture with impaction and displacement. 2. Large hiatal hernia involving the entire stomach. 3. Coronary and Aortic Atherosclerosis (ICD10-I70.0). 4. Colonic diverticulosis. Electronically Signed   By: Lucrezia Europe M.D.   On: 09/01/2018 16:23   Ct Cervical Spine Wo Contrast  Result Date: 09/01/2018 CLINICAL DATA:  Fall. EXAM: CT HEAD WITHOUT CONTRAST CT CERVICAL SPINE WITHOUT CONTRAST TECHNIQUE: Multidetector CT imaging of the head and cervical spine was performed following the standard protocol without intravenous contrast. Multiplanar CT image reconstructions of the cervical spine were also generated. COMPARISON:  06/13/2017 FINDINGS: CT HEAD FINDINGS Brain: No acute infarct, intracranial hemorrhage, mass effect, or extra-axial fluid collection is identified. Cerebral white matter hypodensities are similar to the prior study and nonspecific but compatible with mild chronic small vessel ischemic disease. Mild cerebral atrophy is also  unchanged. Vascular: Calcified atherosclerosis at the skull base. No hyperdense vessel. Skull: No fracture or suspicious osseous lesion. Sinuses/Orbits: Clear paranasal sinuses. Small chronic right mastoid effusion. Bilateral cataract extraction. Other: None. CT CERVICAL SPINE FINDINGS Alignment: Unchanged minimal retrolisthesis of C3 on C4 and C5 on C6 and minimal anterolisthesis of C6 on C7 and C7 on T1. Skull base and vertebrae: No acute fracture or suspicious osseous lesion. Mild median C1-2 arthropathy. Soft tissues and spinal canal: No prevertebral fluid or swelling. No visible canal hematoma. Disc levels: Similar appearance of multilevel disc degeneration including up to severe disc space narrowing from C3-4 to C6-7. Moderate multilevel neural foraminal stenosis due to uncovertebral spurring. Upper chest: Asymmetric right apical pleuroparenchymal scarring. Other: Diminutive or absent left thyroid lobe. Small hypodense nodules in the right thyroid lobe measuring up to approximately 1.2 cm, not significantly changed. Moderate calcified atherosclerosis at the carotid bifurcations. IMPRESSION: 1. No evidence of acute intracranial abnormality. 2. No evidence of acute fracture  or traumatic subluxation in the cervical spine. Electronically Signed   By: Logan Bores M.D.   On: 09/01/2018 16:28   Dg Chest Port 1 View  Result Date: 09/01/2018 CLINICAL DATA:  Fall. EXAM: PORTABLE CHEST 1 VIEW COMPARISON:  June 20, 2017. FINDINGS: There is a large hiatal hernia again identified. The heart, hila, mediastinum, pleura are unremarkable. There are small pleural effusions, left greater than right. Mild interstitial prominence without overt edema. No new infiltrates. IMPRESSION: 1. Small effusions, left greater than right. 2. Large hiatal hernia. 3. Pulmonary venous congestion without overt edema. Electronically Signed   By: Dorise Bullion III M.D   On: 09/01/2018 15:27   Dg Hip Unilat W Or Wo Pelvis 2-3 Views  Left  Result Date: 09/01/2018 CLINICAL DATA:  Status post fall, left hip fracture EXAM: DG HIP (WITH OR WITHOUT PELVIS) 2-3V LEFT COMPARISON:  None. FINDINGS: Severe osteopenia. Displaced left subcapital femoral neck fracture.  No dislocation. No other acute fracture or dislocation. IMPRESSION: 1. Acute displaced left subcapital femoral neck fracture. Electronically Signed   By: Kathreen Devoid   On: 09/01/2018 15:28    All images have been reviewed by me personally.   EKG: EKG ordered by EDP but not obtained.   Assessment/Plan Principal Problem:   Closed left hip fracture (HCC) Active Problems:   HTN (hypertension)   Diabetes mellitus, type 2 (HCC)   Sleep apnea   Hiatal hernia with GERD   Hypothyroidism   Fall at nursing home   Osteopenia   Hypoxemia   AKI (acute kidney injury) ([place])   Leukopenia   Thrombocytopenia (HCC)  Left subcapital femoral neck fracture with impaction and displacement. Fall at assisted living facility Severe osteopenia -Orthopedic surgery, Dr. Mardelle Matte consulted in ED. -Pain management with Dilaudid -Typed and screened in ED. -D5-0.45% NS at 50 cc/h -N.p.o. except sips with meds hemoglobin A1c -SCD for VT prophylaxis -Patient has no cardiac risk factor from surgical standpoint.  Recent echo in 2/20 normal.  Hypoxemia likely due to large hiatal hernia versus cardiopulmonary source.  Major risk factor is the surgery itself and her age.  AKI: suspect prerenal etiology.  Also on Maxzide.  Baseline creatinine 0.65.  -IV fluid as above -Avoid nephrotoxic meds -Monitor daily  Advanced dementia: Awake and alert but disoriented -Frequent reorientation/delirium precaution -Continue home olanzapine after med rec.  Bicytopenia: New leukopenia.  Has history of thrombocytopenia -Recheck CBC in the a.m.  Large hiatal hernia: Noted on CXR and CT chest.  This could be the cause of his hypoxemia. -Head of bed elevation to 30 degree if no objection from orthopedic  standpoint  Hypoxemia: Suspect this to be due to large fetal hernia.  She also has history of OSA not on CPAP.  No significant cardiopulmonary process on CXR and CT chest. -PRN oxygen  DM-2: Does not seem to be medication.  BMP glucose 93. -Check A1c -We will initiate glucose surveillance and sliding scale if needed.  OSA: Not on CPAP -PRN oxygen as above  Hypertension: Normotensive -Hold home Maxzide in the setting of AKI  Other chronic medical conditions stable.  Will resume home medications after med review.  DVT prophylaxis: SCD  Code Status: DNR/DNI Family Communication: Discussed with patient's daughter, Ms. Clapp over the phone  Disposition Plan: Admit to medical telemetry Consults called: Dr. Mardelle Matte by EDP Admission status: Inpatient status.  Patient here with left femoral neck fracture after fall at nursing home and AKI   Mercy Riding MD [facility]  If 7PM-7AM, please contact night-coverage [email or web address] Password [email or web address]  09/01/2018, 6:01 PM

## 2018-09-01 NOTE — ED Notes (Signed)
ED TO INPATIENT HANDOFF REPORT  ED Nurse Name and Phone #: Joellen Jersey [phone]  S Name/Age/Gender Cristina Middleton 85 y.o. female Room/Bed: 022C/022C  Code Status   Code Status: DNR  Home/SNF/Other Nursing Home Patient oriented to: self Is this baseline? Yes   Triage Complete: Triage complete  Chief Complaint Fall  Triage Note Pt arrives via EMS from [facility] memory care unit. Staff reports witnessed fall around 12:30, pt normally walks with walker but unable to for EMS.Endorses L hip leg, hit head. Denies blood thinners. Pt 87% on RA, EMS reports not checking due to not having a pulse ox.    Allergies Allergies  Allergen Reactions  . Penicillins Hives, Itching, Swelling and Other (See Comments)    Reaction:  Unspecified swelling reaction Has patient had a PCN reaction causing immediate rash, facial/tongue/throat swelling, SOB or lightheadedness with hypotension: Yes Has patient had a PCN reaction causing severe rash involving mucus membranes or skin necrosis: No Has patient had a PCN reaction that required hospitalization No Has patient had a PCN reaction occurring within the last 10 years: No If all of the above answers are "NO", then may proceed with Cephalosporin use.  . Sulfa Antibiotics Hives, Itching, Swelling and Other (See Comments)    Reaction:  Unspecified swelling reaction  . Alka-Seltzer [Aspirin Effervescent] Other (See Comments)    Hallucinations    Level of Care/Admitting Diagnosis ED Disposition    ED Disposition Condition [facility]: [facility] [[id number]]  Level of Care: Telemetry Medical [[id number]]  Covid Evaluation: N/A  Diagnosis: Closed left hip fracture [facility]) [[id number]]  Admitting Physician: Mercy Riding [[id number]]  Attending Physician: Mercy Riding [id number]  Estimated length of stay: past midnight tomorrow  Certification:: I certify this patient will need inpatient services for at least 2 midnights  PT Class (Do  Not Modify): Inpatient [101]  PT Acc Code (Do Not Modify): Private [1]       B Medical/Surgery History Past Medical History:  Diagnosis Date  . Anxiety   . Arthritis   . Diabetes mellitus without complication ([place])   . Dislocated shoulder    WAS POPPED BACK IN PLACE  2006???  . GERD (gastroesophageal reflux disease)    TAKES  ACIPHEX  . Hyperlipidemia   . Hypertension   . Hyperthyroidism    NOT SURE WHICH ONE  . PONV (postoperative nausea and vomiting)    YRS AGO.....(6-7 YRS)  . Sleep apnea    WEARS MASK EVERY NOW AND THEN   Past Surgical History:  Procedure Laterality Date  . BREAST SURGERY     CYST REMOVED -- LEFT  . ENDOVENOUS ABLATION SAPHENOUS VEIN W/ LASER Left 07/11/2016   endovenous laser ablation left greater saphenous vein by Tinnie Gens MD  . [place]  . LIPOMA EXCISION     FROM LEFT BACK  . TONSILLECTOMY    . TOTAL KNEE ARTHROPLASTY  05/25/2012   right knee  . TOTAL KNEE ARTHROPLASTY  05/25/2012   Procedure: TOTAL KNEE ARTHROPLASTY;  Surgeon: Yvette Rack., MD;  Location: [facility];  Service: Orthopedics;  Laterality: Right;     A IV Location/Drains/Wounds Patient Lines/Drains/Airways Status   Active Line/Drains/Airways    Name:   Placement date:   Placement time:   Site:   Days:   Peripheral IV 09/01/18 Right Antecubital   09/01/18    1530    Antecubital   less than 1  Peripheral IV 09/01/18 Left Antecubital   09/01/18    1636    Antecubital   less than 1          Intake/Output Last 24 hours  Intake/Output Summary (Last 24 hours) at 09/01/2018 1743 Last data filed at 09/01/2018 1729 Gross per 24 hour  Intake 500 ml  Output -  Net 500 ml    Labs/Imaging Results for orders placed or performed during the hospital encounter of 09/01/18 (from the past 48 hour(s))  CBC with Differential/Platelet     Status: Abnormal   Collection Time: 09/01/18  3:01 PM  Result Value Ref Range   WBC 2.9 (L) 4.0 - 10.5 K/uL   RBC 3.93 3.87 -  5.11 MIL/uL   Hemoglobin 13.2 12.0 - 15.0 g/dL   HCT 40.1 36.0 - 46.0 %   MCV 102.0 (H) 80.0 - 100.0 fL   MCH 33.6 26.0 - 34.0 pg   MCHC 32.9 30.0 - 36.0 g/dL   RDW 15.0 11.5 - 15.5 %   Platelets 140 (L) 150 - 400 K/uL   nRBC 0.0 0.0 - 0.2 %   Neutrophils Relative % 57 %   Neutro Abs 1.6 (L) 1.7 - 7.7 K/uL   Lymphocytes Relative 26 %   Lymphs Abs 0.8 0.7 - 4.0 K/uL   Monocytes Relative 13 %   Monocytes Absolute 0.4 0.1 - 1.0 K/uL   Eosinophils Relative 3 %   Eosinophils Absolute 0.1 0.0 - 0.5 K/uL   Basophils Relative 0 %   Basophils Absolute 0.0 0.0 - 0.1 K/uL   Immature Granulocytes 1 %   Abs Immature Granulocytes 0.02 0.00 - 0.07 K/uL    Comment: Performed at [facility], [address], [place], [place] [address]  Comprehensive metabolic panel     Status: Abnormal   Collection Time: 09/01/18  3:01 PM  Result Value Ref Range   Sodium 139 135 - 145 mmol/L   Potassium 5.0 3.5 - 5.1 mmol/L   Chloride 97 (L) 98 - 111 mmol/L   CO2 28 22 - 32 mmol/L   Glucose, Bld 93 70 - 99 mg/dL   BUN 40 (H) 8 - 23 mg/dL   Creatinine, Ser 1.45 (H) 0.44 - 1.00 mg/dL   Calcium 9.6 8.9 - 10.3 mg/dL   Total Protein 6.8 6.5 - 8.1 g/dL   Albumin 4.1 3.5 - 5.0 g/dL   AST 20 15 - 41 U/L   ALT 19 0 - 44 U/L   Alkaline Phosphatase 80 38 - 126 U/L   Total Bilirubin 0.5 0.3 - 1.2 mg/dL   GFR calc non Af Amer 31 (L) >60 mL/min   GFR calc Af Amer 35 (L) >60 mL/min   Anion gap 14 5 - 15    Comment: Performed at [facility] [address], [place], [place] [address]  Protime-INR     Status: None   Collection Time: 09/01/18  3:01 PM  Result Value Ref Range   Prothrombin Time 12.3 11.4 - 15.2 seconds   INR 0.9 0.8 - 1.2    Comment: (NOTE) INR goal varies based on device and disease states. Performed at [facility], [place] [address], [place], [place] [address]   Type and screen     Status: None   Collection Time: 09/01/18  3:01 PM  Result Value Ref Range   ABO/RH(D) A POS     Antibody Screen NEG    Sample Expiration  09/04/2018 Performed at [facility] [address], [place], [place] [address]   I-stat troponin, ED     Status: None   Collection Time: 09/01/18  3:09 PM  Result Value Ref Range   Troponin i, poc 0.01 0.00 - 0.08 ng/mL   Comment 3            Comment: Due to the release kinetics of cTnI, a negative result within the first hours of the onset of symptoms does not rule out myocardial infarction with certainty. If myocardial infarction is still suspected, repeat the test at appropriate intervals.   SARS Coronavirus 2 (CEPHEID - Performed in [facility] hospital lab), Hosp Order     Status: None   Collection Time: 09/01/18  3:44 PM  Result Value Ref Range   SARS Coronavirus 2 NEGATIVE NEGATIVE    Comment: (NOTE) If result is NEGATIVE SARS-CoV-2 target nucleic acids are NOT DETECTED. The SARS-CoV-2 RNA is generally detectable in upper and lower  respiratory specimens during the acute phase of infection. The lowest  concentration of SARS-CoV-2 viral copies this assay can detect is 250  copies / mL. A negative result does not preclude SARS-CoV-2 infection  and should not be used as the sole basis for treatment or other  patient management decisions.  A negative result may occur with  improper specimen collection / handling, submission of specimen other  than nasopharyngeal swab, presence of viral mutation(s) within the  areas targeted by this assay, and inadequate number of viral copies  (<250 copies / mL). A negative result must be combined with clinical  observations, patient history, and epidemiological information. If result is POSITIVE SARS-CoV-2 target nucleic acids are DETECTED. The SARS-CoV-2 RNA is generally detectable in upper and lower  respiratory specimens dur ing the acute phase of infection.  Positive  results are indicative of active infection with SARS-CoV-2.  Clinical  correlation with patient history and other  diagnostic information is  necessary to determine patient infection status.  Positive results do  not rule out bacterial infection or co-infection with other viruses. If result is PRESUMPTIVE POSTIVE SARS-CoV-2 nucleic acids MAY BE PRESENT.   A presumptive positive result was obtained on the submitted specimen  and confirmed on repeat testing.  While 2019 novel coronavirus  (SARS-CoV-2) nucleic acids may be present in the submitted sample  additional confirmatory testing may be necessary for epidemiological  and / or clinical management purposes  to differentiate between  SARS-CoV-2 and other Sarbecovirus currently known to infect humans.  If clinically indicated additional testing with an alternate test  methodology [phone]) is advised. The SARS-CoV-2 RNA is generally  detectable in upper and lower respiratory sp ecimens during the acute  phase of infection. The expected result is Negative. Fact Sheet for Patients:  StrictlyIdeas.no Fact Sheet for Healthcare Providers: BankingDealers.co.za This test is not yet approved or cleared by the [place] FDA and has been authorized for detection and/or diagnosis of SARS-CoV-2 by FDA under an Emergency Use Authorization (EUA).  This EUA will remain in effect (meaning this test can be used) for the duration of the COVID-19 declaration under Section 564(b)(1) of the Act, 21 U.S.C. section 360bbb-3(b)(1), unless the authorization is terminated or revoked sooner. Performed at [facility], [place] [address] [place], [place] [address]    Ct Abdomen Pelvis Wo Contrast  Result Date: 09/01/2018 CLINICAL DATA:  [place], left lower extremity pain EXAM: CT CHEST, ABDOMEN AND PELVIS WITHOUT CONTRAST TECHNIQUE: Multidetector CT imaging  of the chest, abdomen and pelvis was performed following the standard protocol without IV contrast. COMPARISON:  06/20/2017 and previous FINDINGS: CT CHEST FINDINGS  Cardiovascular: Coronary and Aortic Atherosclerosis (ICD10-170.0). Tortuous descending thoracic aorta. Heart size normal. No pericardial effusion. Mediastinum/Nodes: Large hiatal hernia involving virtually the entire stomach. No hilar or mediastinal adenopathy. No mediastinal hematoma. Lungs/Pleura: No pneumothorax. Coarse peripheral airspace opacities. Significant improvement in the pleural effusions seen previously with minimal trace left residual. Improved aeration in the lung bases with scattered coarse airspace opacities in the posterior right lower lobe and medial left lower lobe with atelectasis. Musculoskeletal: No acute fracture. Thoracic dextroscoliosis with anterior vertebral endplate spurring at multiple levels in the mid and lower thoracic spine. Bilateral shoulder DJD. CT ABDOMEN PELVIS FINDINGS Hepatobiliary: No focal liver abnormality is seen. No gallstones, gallbladder wall thickening, or biliary dilatation. Pancreas: Unremarkable. No pancreatic ductal dilatation or surrounding inflammatory changes. Spleen: Normal in size without focal abnormality. Adrenals/Urinary Tract: No evidence of adrenal mass. No hydronephrosis. No evidence of renal mass. Urinary bladder physiologically distended. Stomach/Bowel: Virtually the entire stomach is involved in a large hiatal hernia. Small bowel is nondilated. Normal appendix. Scattered colonic diverticula most numerous in the distal descending and sigmoid segments, without significant adjacent inflammatory/edematous change or abscess. Vascular/Lymphatic: Aortoiliac arterial calcifications without aneurysm. No abdominal or pelvic adenopathy. Reproductive: Uterus and bilateral adnexa are unremarkable. Other: No ascites. Left pelvic phleboliths. No free air. Musculoskeletal: Subcapital left femoral neck fracture with impaction and displacement, resulting varus deformity. No pelvic ring fracture. Mild levoscoliosis in the lumbar spine with multilevel spondylitic  change. IMPRESSION: 1. Left subcapital femoral neck fracture with impaction and displacement. 2. Large hiatal hernia involving the entire stomach. 3. Coronary and Aortic Atherosclerosis (ICD10-I70.0). 4. Colonic diverticulosis. Electronically Signed   By: Lucrezia Europe M.D.   On: 09/01/2018 16:23   Ct Head Wo Contrast  Result Date: 09/01/2018 CLINICAL DATA:  Fall. EXAM: CT HEAD WITHOUT CONTRAST CT CERVICAL SPINE WITHOUT CONTRAST TECHNIQUE: Multidetector CT imaging of the head and cervical spine was performed following the standard protocol without intravenous contrast. Multiplanar CT image reconstructions of the cervical spine were also generated. COMPARISON:  06/13/2017 FINDINGS: CT HEAD FINDINGS Brain: No acute infarct, intracranial hemorrhage, mass effect, or extra-axial fluid collection is identified. Cerebral white matter hypodensities are similar to the prior study and nonspecific but compatible with mild chronic small vessel ischemic disease. Mild cerebral atrophy is also unchanged. Vascular: Calcified atherosclerosis at the skull base. No hyperdense vessel. Skull: No fracture or suspicious osseous lesion. Sinuses/Orbits: Clear paranasal sinuses. Small chronic right mastoid effusion. Bilateral cataract extraction. Other: None. CT CERVICAL SPINE FINDINGS Alignment: Unchanged minimal retrolisthesis of C3 on C4 and C5 on C6 and minimal anterolisthesis of C6 on C7 and C7 on T1. Skull base and vertebrae: No acute fracture or suspicious osseous lesion. Mild median C1-2 arthropathy. Soft tissues and spinal canal: No prevertebral fluid or swelling. No visible canal hematoma. Disc levels: Similar appearance of multilevel disc degeneration including up to severe disc space narrowing from C3-4 to C6-7. Moderate multilevel neural foraminal stenosis due to uncovertebral spurring. Upper chest: Asymmetric right apical pleuroparenchymal scarring. Other: Diminutive or absent left thyroid lobe. Small hypodense nodules in the  right thyroid lobe measuring up to approximately 1.2 cm, not significantly changed. Moderate calcified atherosclerosis at the carotid bifurcations. IMPRESSION: 1. No evidence of acute intracranial abnormality. 2. No evidence of acute fracture or traumatic subluxation in the cervical spine. Electronically Signed   By: Seymour Bars.D.  On: 09/01/2018 16:28   Ct Chest Wo Contrast  Result Date: 09/01/2018 CLINICAL DATA:  [place], left lower extremity pain EXAM: CT CHEST, ABDOMEN AND PELVIS WITHOUT CONTRAST TECHNIQUE: Multidetector CT imaging of the chest, abdomen and pelvis was performed following the standard protocol without IV contrast. COMPARISON:  06/20/2017 and previous FINDINGS: CT CHEST FINDINGS Cardiovascular: Coronary and Aortic Atherosclerosis (ICD10-170.0). Tortuous descending thoracic aorta. Heart size normal. No pericardial effusion. Mediastinum/Nodes: Large hiatal hernia involving virtually the entire stomach. No hilar or mediastinal adenopathy. No mediastinal hematoma. Lungs/Pleura: No pneumothorax. Coarse peripheral airspace opacities. Significant improvement in the pleural effusions seen previously with minimal trace left residual. Improved aeration in the lung bases with scattered coarse airspace opacities in the posterior right lower lobe and medial left lower lobe with atelectasis. Musculoskeletal: No acute fracture. Thoracic dextroscoliosis with anterior vertebral endplate spurring at multiple levels in the mid and lower thoracic spine. Bilateral shoulder DJD. CT ABDOMEN PELVIS FINDINGS Hepatobiliary: No focal liver abnormality is seen. No gallstones, gallbladder wall thickening, or biliary dilatation. Pancreas: Unremarkable. No pancreatic ductal dilatation or surrounding inflammatory changes. Spleen: Normal in size without focal abnormality. Adrenals/Urinary Tract: No evidence of adrenal mass. No hydronephrosis. No evidence of renal mass. Urinary bladder physiologically distended.  Stomach/Bowel: Virtually the entire stomach is involved in a large hiatal hernia. Small bowel is nondilated. Normal appendix. Scattered colonic diverticula most numerous in the distal descending and sigmoid segments, without significant adjacent inflammatory/edematous change or abscess. Vascular/Lymphatic: Aortoiliac arterial calcifications without aneurysm. No abdominal or pelvic adenopathy. Reproductive: Uterus and bilateral adnexa are unremarkable. Other: No ascites. Left pelvic phleboliths. No free air. Musculoskeletal: Subcapital left femoral neck fracture with impaction and displacement, resulting varus deformity. No pelvic ring fracture. Mild levoscoliosis in the lumbar spine with multilevel spondylitic change. IMPRESSION: 1. Left subcapital femoral neck fracture with impaction and displacement. 2. Large hiatal hernia involving the entire stomach. 3. Coronary and Aortic Atherosclerosis (ICD10-I70.0). 4. Colonic diverticulosis. Electronically Signed   By: Lucrezia Europe M.D.   On: 09/01/2018 16:23   Ct Cervical Spine Wo Contrast  Result Date: 09/01/2018 CLINICAL DATA:  Fall. EXAM: CT HEAD WITHOUT CONTRAST CT CERVICAL SPINE WITHOUT CONTRAST TECHNIQUE: Multidetector CT imaging of the head and cervical spine was performed following the standard protocol without intravenous contrast. Multiplanar CT image reconstructions of the cervical spine were also generated. COMPARISON:  06/13/2017 FINDINGS: CT HEAD FINDINGS Brain: No acute infarct, intracranial hemorrhage, mass effect, or extra-axial fluid collection is identified. Cerebral white matter hypodensities are similar to the prior study and nonspecific but compatible with mild chronic small vessel ischemic disease. Mild cerebral atrophy is also unchanged. Vascular: Calcified atherosclerosis at the skull base. No hyperdense vessel. Skull: No fracture or suspicious osseous lesion. Sinuses/Orbits: Clear paranasal sinuses. Small chronic right mastoid effusion. Bilateral  cataract extraction. Other: None. CT CERVICAL SPINE FINDINGS Alignment: Unchanged minimal retrolisthesis of C3 on C4 and C5 on C6 and minimal anterolisthesis of C6 on C7 and C7 on T1. Skull base and vertebrae: No acute fracture or suspicious osseous lesion. Mild median C1-2 arthropathy. Soft tissues and spinal canal: No prevertebral fluid or swelling. No visible canal hematoma. Disc levels: Similar appearance of multilevel disc degeneration including up to severe disc space narrowing from C3-4 to C6-7. Moderate multilevel neural foraminal stenosis due to uncovertebral spurring. Upper chest: Asymmetric right apical pleuroparenchymal scarring. Other: Diminutive or absent left thyroid lobe. Small hypodense nodules in the right thyroid lobe measuring up to approximately 1.2 cm, not significantly changed. Moderate calcified atherosclerosis at  the carotid bifurcations. IMPRESSION: 1. No evidence of acute intracranial abnormality. 2. No evidence of acute fracture or traumatic subluxation in the cervical spine. Electronically Signed   By: Logan Bores M.D.   On: 09/01/2018 16:28   Dg Chest Port 1 View  Result Date: 09/01/2018 CLINICAL DATA:  Fall. EXAM: PORTABLE CHEST 1 VIEW COMPARISON:  June 20, 2017. FINDINGS: There is a large hiatal hernia again identified. The heart, hila, mediastinum, pleura are unremarkable. There are small pleural effusions, left greater than right. Mild interstitial prominence without overt edema. No new infiltrates. IMPRESSION: 1. Small effusions, left greater than right. 2. Large hiatal hernia. 3. Pulmonary venous congestion without overt edema. Electronically Signed   By: Dorise Bullion III M.D   On: 09/01/2018 15:27   Dg Hip Unilat W Or Wo Pelvis 2-3 Views Left  Result Date: 09/01/2018 CLINICAL DATA:  Status post fall, left hip fracture EXAM: DG HIP (WITH OR WITHOUT PELVIS) 2-3V LEFT COMPARISON:  None. FINDINGS: Severe osteopenia. Displaced left subcapital femoral neck fracture.  No  dislocation. No other acute fracture or dislocation. IMPRESSION: 1. Acute displaced left subcapital femoral neck fracture. Electronically Signed   By: Kathreen Devoid   On: 09/01/2018 15:28    Pending Labs Unresulted Labs (From admission, onward)    Start     Ordered   09/02/18 0500  CBC  Tomorrow morning,   R     09/01/18 1743   09/02/18 0500  Protime-INR  Tomorrow morning,   R     09/01/18 1743   09/02/18 9476  Basic metabolic panel  Tomorrow morning,   R     09/01/18 1743          Vitals/Pain Today's Vitals   09/01/18 1500 09/01/18 1530 09/01/18 1614 09/01/18 1700  BP: 138/80 136/73 136/73 128/76  Pulse: 81 81 89 90  Resp: 15 14 20 18   Temp:      TempSrc:      SpO2: 98% 95% 98% 96%  Weight:      Height:        Isolation Precautions No active isolations  Medications Medications  acetaminophen (TYLENOL) tablet 650 mg (has no administration in time range)    Or  acetaminophen (TYLENOL) suppository 650 mg (has no administration in time range)  oxyCODONE (Oxy IR/ROXICODONE) immediate release tablet 5 mg (has no administration in time range)  traZODone (DESYREL) tablet 25 mg (has no administration in time range)  polyethylene glycol (MIRALAX / GLYCOLAX) packet 17 g (has no administration in time range)  ondansetron (ZOFRAN) tablet 4 mg (has no administration in time range)    Or  ondansetron (ZOFRAN) injection 4 mg (has no administration in time range)  albuterol (PROVENTIL) (2.5 MG/3ML) 0.083% nebulizer solution 2.5 mg (has no administration in time range)  dextrose 5 %-0.45 % sodium chloride infusion (has no administration in time range)  HYDROmorphone (DILAUDID) injection 0.5 mg (has no administration in time range)  fentaNYL (SUBLIMAZE) injection 50 mcg (50 mcg Intravenous Given 09/01/18 1456)  sodium chloride 0.9 % bolus 500 mL (0 mLs Intravenous Stopped 09/01/18 1729)    Mobility walks with device High fall risk   Focused Assessments  Cardiac Rhythm: Normal sinus  rhythm         R Recommendations: See Admitting Provider Note  Report given to:   Additional Notes:

## 2018-09-02 ENCOUNTER — Encounter (HOSPITAL_COMMUNITY): Payer: Self-pay | Admitting: Anesthesiology

## 2018-09-02 ENCOUNTER — Encounter (HOSPITAL_COMMUNITY)
Admission: EM | Disposition: A | Discharge status: Hospice | Payer: Self-pay | Source: Skilled Nursing Facility | Attending: Internal Medicine

## 2018-09-02 ENCOUNTER — Inpatient Hospital Stay (HOSPITAL_COMMUNITY): Payer: Medicare Other

## 2018-09-02 DIAGNOSIS — L8990 Pressure ulcer of unspecified site, unspecified stage: Secondary | ICD-10-CM

## 2018-09-02 DIAGNOSIS — E119 Type 2 diabetes mellitus without complications: Secondary | ICD-10-CM

## 2018-09-02 LAB — BASIC METABOLIC PANEL
Anion gap: 13 (ref 5–15)
BUN: 42 mg/dL — ABNORMAL HIGH (ref 8–23)
CO2: 28 mmol/L (ref 22–32)
Calcium: 9.1 mg/dL (ref 8.9–10.3)
Chloride: 99 mmol/L (ref 98–111)
Creatinine, Ser: 1.31 mg/dL — ABNORMAL HIGH (ref 0.44–1.00)
GFR calc Af Amer: 40 mL/min — ABNORMAL LOW (ref >60)
GFR calc non Af Amer: 35 mL/min — ABNORMAL LOW (ref >60)
Glucose, Bld: 186 mg/dL — ABNORMAL HIGH (ref 70–99)
Potassium: 4.2 mmol/L (ref 3.5–5.1)
Sodium: 140 mmol/L (ref 135–145)

## 2018-09-02 LAB — CBC
HCT: 37.3 % (ref 36.0–46.0)
Hemoglobin: 12.5 g/dL (ref 12.0–15.0)
MCH: 34 pg (ref 26.0–34.0)
MCHC: 33.5 g/dL (ref 30.0–36.0)
MCV: 101.4 fL — ABNORMAL HIGH (ref 80.0–100.0)
Platelets: 130 10*3/uL — ABNORMAL LOW (ref 150–400)
RBC: 3.68 MIL/uL — ABNORMAL LOW (ref 3.87–5.11)
RDW: 15.2 % (ref 11.5–15.5)
WBC: 7.7 10*3/uL (ref 4.0–10.5)
nRBC: 0 % (ref 0.0–0.2)

## 2018-09-02 LAB — PROTIME-INR
INR: 1.1 (ref 0.8–1.2)
Prothrombin Time: 13.8 seconds (ref 11.4–15.2)

## 2018-09-02 SURGERY — HEMIARTHROPLASTY, HIP, DIRECT ANTERIOR APPROACH, FOR FRACTURE
Anesthesia: General | Laterality: Left

## 2018-09-02 MED ORDER — CHLORHEXIDINE GLUCONATE CLOTH 2 % EX PADS
6.0000 | MEDICATED_PAD | Freq: Every day | CUTANEOUS | Status: DC
  Administered 2018-09-02 – 2018-09-05 (×4): 6 via TOPICAL

## 2018-09-02 MED ORDER — MUPIROCIN 2 % EX OINT
1.0000 "application " | TOPICAL_OINTMENT | Freq: Two times a day (BID) | CUTANEOUS | Status: DC
  Administered 2018-09-02 – 2018-09-04 (×6): 1 via NASAL
  Filled 2018-09-02: qty 22

## 2018-09-02 NOTE — Progress Notes (Addendum)
Pt is sleeping and desat in the 60s. Pt is not showing signs of respiratory distressed while sleeping. Pt increased to 6 L of O2 [place] Rapid response called.   0049 Rapid response nurse at bedside of patient and NRB mask put on pt connected to oxygen, pts O2 sats increased to 93-95%. NRB mask removed and pt was put back on [place] and O2 sats dropped again to low 80s. Per rapid response nurse at bedside to leave NRB mask (not connected to oxygen) over [place] at 6L.   0139 Pt O2 sats currently at 94% with [place] 4L with NRB mask (not connected to oxygen) over . Will continue to monitor pt. Pt is sleeping. No signs of respiratory distress.

## 2018-09-02 NOTE — Progress Notes (Addendum)
Patient ID: ROSAISELA JAMROZ, female   DOB: 10-04-1920, 85 y.o.   MRN: [id number]     Subjective:  Patient reports pain as mild to moderate.  Patient in bed and lethargic does open eyes and follow commands but not able to stay awake  Objective:   VITALS:   Vitals:   09/02/18 0115 09/02/18 0139 09/02/18 0200 09/02/18 0427  BP:    114/65  Pulse:    (!) 113  Resp:    18  Temp:    98.4 F (36.9 C)  TempSrc:    Oral  SpO2: 93% 94% 92% 92%  Weight:      Height:        ABD soft Sensation intact distally Dorsiflexion/Plantar flexion intact Pain with log roll and any hip motion  Lab Results  Component Value Date   WBC 7.7 09/02/2018   HGB 12.5 09/02/2018   HCT 37.3 09/02/2018   MCV 101.4 (H) 09/02/2018   PLT 130 (L) 09/02/2018   BMET    Component Value Date/Time   NA 140 09/02/2018 0234   K 4.2 09/02/2018 0234   CL 99 09/02/2018 0234   CO2 28 09/02/2018 0234   GLUCOSE 186 (H) 09/02/2018 0234   BUN 42 (H) 09/02/2018 0234   CREATININE 1.31 (H) 09/02/2018 0234   CALCIUM 9.1 09/02/2018 0234   GFRNONAA 35 (L) 09/02/2018 0234   GFRAA 40 (L) 09/02/2018 0234     Assessment/Plan: Day of Surgery   Principal Problem:   Closed left hip fracture (HCC) Active Problems:   HTN (hypertension)   Diabetes mellitus, type 2 ([place])   Sleep apnea   Hiatal hernia with GERD   Hypothyroidism   Fall at nursing home   Osteopenia   Hypoxemia   AKI (acute kidney injury) ([place])   Leukopenia   Thrombocytopenia (HCC)   Advance diet Plan for Pall. Evaluation  NWB left lower   Lunette Stands 09/02/2018, 12:03 PM  Discussed and agree with above.  Another family conference today with anesthesia.  Palliative care consult initiated, I have spoken with their MD.  Marena Chancy about surgical intervention yet.  Marchia Bond, MD Cell [phone]

## 2018-09-02 NOTE — Significant Event (Addendum)
Rapid Response Event Note  Overview:Called d/t pt SpO2-70s-80s on 5L [place].  Asked RN to increase to 6L [place]. Time Called: 0045 Arrival Time: 0049 Event Type: Respiratory  Initial Focused Assessment: Pt laying in bed, sleeping, mouth open, no signs of respiratory distress. RR-16, SpO2-79% on 6L [place], HR-112.  Pt woken up, alert but confused (adv dementia at baseline), will follow commands. SpO2 did not change once pt awake.  Lungs diminished t/o. Skin warm and dry. Pt placed on NRB mask with sats increasing to 95%.  Pt then placed back on 6L [place] with NRB mask over [place] but not connected to oxygen.  SpO2-93%.  Attempted to place pt back on 6L [place] without NRB mask and SpO2 dropped to 85%.  NRB mask placed back over [place] and SpO2-92%. Pt was hypoxic on admission to ED. PCXR done at that time showing Small effusions L>R, large hiatal hernia, pulmonary venous congestion without overt edema.  Interventions: NRB mask not connected to O2 placed over [place]. Plan of Care (if not transferred): Notify NP of situation and interventions made.  If further assistance needed, please call RRT. Event Summary: Name of Physician Notified: Kennon Holter, NP at (PTA RRT)    at    Outcome: Stayed in room and stabalized  Event End Time: 0115  Dillard [place]

## 2018-09-02 NOTE — Plan of Care (Signed)

## 2018-09-02 NOTE — Progress Notes (Addendum)
PROGRESS NOTE    Azariah Latendresse [facility]  PPI:[id number] DOB: 07-31-1920 DOA: 09/01/2018 PCP: Jani Gravel, MD   Brief Narrative:  Per HPI: LABERTA WILBON is a 85 y.o. female with advanced dementia, DM-2, hypertension, OSA not on CPAP, hypothyroidism and anxiety presenting from [facility] memory unit/ALF with fall.  Patient has advanced dementia and was not able to provide history. Per EDP note and patient's daughter, patient fell cemented past 12 PM when she was trying to reach for her walker.  Staff found her on the floor and had obvious left hip deformity and called EMS.  Found to be hypoxemic to 85% per EMS report to EDP.  She was also hypoxemic to 85% in ED. She easily recovered to upper 90s on 2 L by nasal cannula.  Per daughter, no recent illness or medication change she is aware of. Reportedly has no URI symptoms, cough or shortness of breath.  Not on blood thinner. Does not wear CPAP. Walks with walker at baseline.  In ED, hemodynamically stable.  Hypoxemic to 85% and recovered to upper 90s on 2 L by nasal cannula.  CBC with leukopenia to 2.9 (new), macrocytosis to 102 and thrombocytopenia to 140 (baseline).  CMP significant for creatinine to 1.45 (baseline 0.67).  Troponin negative.  PT/INR within normal limits.  COVID-19 negative.  CT head and cervical spine negative for acute finding.  CXR with small effusion, large atrial hernia, PV congestion without overt edema.  DG left hip showed acute displaced left subcapital femoral neck fracture and severe osteopenia.  CT chest, abdomen and pelvis without contrast confirmed the fracture with impaction and displacement, large ureteral hernia involving the entire stomach and colonic diverticulosis. Orthopedic surgery Dr. Mardelle Matte consulted by EDP.  Typed and screened.  Received fentanyl and normal saline bolus 500 cc and hospital service was called for admission.  Reached out to patient's daughter, Ms. Laymond Purser who is her healthcare power of attorney.  Updated  her about the above finding.  Patient's daughter wants her mother to be DNR/DNI in case of cardiopulmonary arrest. She also has a living will stating that patient does not want aggressive life prolonging measures such as feeding tube.  Patient daughter will send over the living will to the hospital.    Assessment & Plan:   Principal Problem:   Closed left hip fracture (HCC) Active Problems:   HTN (hypertension)   Diabetes mellitus, type 2 (HCC)   Sleep apnea   Hiatal hernia with GERD   Hypothyroidism   Fall at nursing home   Osteopenia   Hypoxemia   AKI (acute kidney injury) ([place])   Leukopenia   Thrombocytopenia (HCC)   ADDENDUM: RECEIVED MESSAGE THAT PALLIATIVE CARE TO SEE PT Monday, CONSULT PLACED, ZEBA  Left subcapital femoral neck fracture with impaction and displacement. Fall at assisted living facility Severe osteopenia -Orthopedic surgery, Dr. Mardelle Matte consulted in ED is seen patient with plans for operative management per family request -Pain management with Dilaudid -D5-0.45% NS at 50 cc/h -N.p.o. except sips with meds hemoglobin A1c -SCD for VT prophylaxis -Patient has no cardiac risk factor from surgical standpoint.  Recent echo in 2/20 normal.  Hypoxemia likely due to large hiatal hernia versus cardiopulmonary source.  Major risk factor is the surgery itself and her age.  Acute hypoxic respiratory failure rapidly recovered. -Chest x-ray obtained this morning clear no edema no evidence of CHF -Patient satting 90 to 93% normal respiratory rate normal heart rate this morning -Likely secondary to obstructive sleep apnea not  on CPAP with rapid recovery during rapid response complicated by LARGE hiatal hernia noted on CT  AKI: suspect prerenal etiology.  Also on Maxzide.  Baseline creatinine 0.65.  -IV fluid as above, recheck tomorrow currently 1.31 down from 1.45 -Avoid nephrotoxic meds -Monitor daily  Advanced dementia: Awake and alert but disoriented -High risk for  postop delirium, frequent reorientation/delirium precaution -Continue home olanzapine after med rec.  History of thrombocytopenia -Platelets 130,  182 in February 2018 -Recheck CBC in the a.m -NO BLEEDING, MONITOR.  Large hiatal hernia: Noted on CXR and CT chest.  This could contribute to their hypoxemia. -Head of bed elevation to 30 degree if no objection from orthopedic standpoint  DM-2: Does not seem to be medication.  BMP glucose 93. -Check A1c -We will initiate glucose surveillance and sliding scale if needed.  OSA: Not on CPAP -PRN oxygen as above  Hypertension: Normotensive -Hold home Maxzide in the setting of AKI  DVT prophylaxis: SCD/Compression stockings  Code Status: dnr    Code Status Orders  (From admission, onward)         Start     Ordered   09/01/18 1739  Do not attempt resuscitation (DNR)  Continuous    Question Answer Comment  In the event of cardiac or respiratory ARREST Do not call a code blue   In the event of cardiac or respiratory ARREST Do not perform Intubation, CPR, defibrillation or ACLS   In the event of cardiac or respiratory ARREST Use medication by any route, position, wound care, and other measures to relive pain and suffering. May use oxygen, suction and manual treatment of airway obstruction as needed for comfort.      09/01/18 1743        Code Status History    Date Active Date Inactive Code Status Order ID Comments User Context   06/17/2017 1754 06/22/2017 1954 DNR [id number]  Bonnell Public, MD Inpatient   06/14/2017 0339 06/17/2017 1754 Full Code [id number]  Vianne Bulls, MD ED   12/09/2016 0153 12/10/2016 1631 Full Code [id number]  Reubin Milan, MD Inpatient   06/14/2016 1743 06/16/2016 1806 Full Code [id number]  Janece Canterbury, MD Inpatient   06/13/2016 2023 06/14/2016 1743 DNR [id number]  Janece Canterbury, MD Inpatient   06/28/2014 0235 07/02/2014 1505 DNR [id number]  Ivor Costa, MD Inpatient     Family Communication:  None today Disposition Plan:   Pt will remain inpatient for operative repair of hip fracture. She will require postop hip protocol with PT/OT eval, and IV pain control Consults called: None Admission status: Inpatient   Consultants:   ORTHO  Procedures:  Ct Abdomen Pelvis Wo Contrast  Result Date: 09/01/2018 CLINICAL DATA:  [place], left lower extremity pain EXAM: CT CHEST, ABDOMEN AND PELVIS WITHOUT CONTRAST TECHNIQUE: Multidetector CT imaging of the chest, abdomen and pelvis was performed following the standard protocol without IV contrast. COMPARISON:  06/20/2017 and previous FINDINGS: CT CHEST FINDINGS Cardiovascular: Coronary and Aortic Atherosclerosis (ICD10-170.0). Tortuous descending thoracic aorta. Heart size normal. No pericardial effusion. Mediastinum/Nodes: Large hiatal hernia involving virtually the entire stomach. No hilar or mediastinal adenopathy. No mediastinal hematoma. Lungs/Pleura: No pneumothorax. Coarse peripheral airspace opacities. Significant improvement in the pleural effusions seen previously with minimal trace left residual. Improved aeration in the lung bases with scattered coarse airspace opacities in the posterior right lower lobe and medial left lower lobe with atelectasis. Musculoskeletal: No acute fracture. Thoracic dextroscoliosis with anterior vertebral endplate spurring at multiple levels in the mid and  lower thoracic spine. Bilateral shoulder DJD. CT ABDOMEN PELVIS FINDINGS Hepatobiliary: No focal liver abnormality is seen. No gallstones, gallbladder wall thickening, or biliary dilatation. Pancreas: Unremarkable. No pancreatic ductal dilatation or surrounding inflammatory changes. Spleen: Normal in size without focal abnormality. Adrenals/Urinary Tract: No evidence of adrenal mass. No hydronephrosis. No evidence of renal mass. Urinary bladder physiologically distended. Stomach/Bowel: Virtually the entire stomach is involved in a large hiatal hernia. Small bowel is  nondilated. Normal appendix. Scattered colonic diverticula most numerous in the distal descending and sigmoid segments, without significant adjacent inflammatory/edematous change or abscess. Vascular/Lymphatic: Aortoiliac arterial calcifications without aneurysm. No abdominal or pelvic adenopathy. Reproductive: Uterus and bilateral adnexa are unremarkable. Other: No ascites. Left pelvic phleboliths. No free air. Musculoskeletal: Subcapital left femoral neck fracture with impaction and displacement, resulting varus deformity. No pelvic ring fracture. Mild levoscoliosis in the lumbar spine with multilevel spondylitic change. IMPRESSION: 1. Left subcapital femoral neck fracture with impaction and displacement. 2. Large hiatal hernia involving the entire stomach. 3. Coronary and Aortic Atherosclerosis (ICD10-I70.0). 4. Colonic diverticulosis. Electronically Signed   By: Lucrezia Europe M.D.   On: 09/01/2018 16:23   Ct Head Wo Contrast  Result Date: 09/01/2018 CLINICAL DATA:  Fall. EXAM: CT HEAD WITHOUT CONTRAST CT CERVICAL SPINE WITHOUT CONTRAST TECHNIQUE: Multidetector CT imaging of the head and cervical spine was performed following the standard protocol without intravenous contrast. Multiplanar CT image reconstructions of the cervical spine were also generated. COMPARISON:  06/13/2017 FINDINGS: CT HEAD FINDINGS Brain: No acute infarct, intracranial hemorrhage, mass effect, or extra-axial fluid collection is identified. Cerebral white matter hypodensities are similar to the prior study and nonspecific but compatible with mild chronic small vessel ischemic disease. Mild cerebral atrophy is also unchanged. Vascular: Calcified atherosclerosis at the skull base. No hyperdense vessel. Skull: No fracture or suspicious osseous lesion. Sinuses/Orbits: Clear paranasal sinuses. Small chronic right mastoid effusion. Bilateral cataract extraction. Other: None. CT CERVICAL SPINE FINDINGS Alignment: Unchanged minimal retrolisthesis  of C3 on C4 and C5 on C6 and minimal anterolisthesis of C6 on C7 and C7 on T1. Skull base and vertebrae: No acute fracture or suspicious osseous lesion. Mild median C1-2 arthropathy. Soft tissues and spinal canal: No prevertebral fluid or swelling. No visible canal hematoma. Disc levels: Similar appearance of multilevel disc degeneration including up to severe disc space narrowing from C3-4 to C6-7. Moderate multilevel neural foraminal stenosis due to uncovertebral spurring. Upper chest: Asymmetric right apical pleuroparenchymal scarring. Other: Diminutive or absent left thyroid lobe. Small hypodense nodules in the right thyroid lobe measuring up to approximately 1.2 cm, not significantly changed. Moderate calcified atherosclerosis at the carotid bifurcations. IMPRESSION: 1. No evidence of acute intracranial abnormality. 2. No evidence of acute fracture or traumatic subluxation in the cervical spine. Electronically Signed   By: Logan Bores M.D.   On: 09/01/2018 16:28   Ct Chest Wo Contrast  Result Date: 09/01/2018 CLINICAL DATA:  [place], left lower extremity pain EXAM: CT CHEST, ABDOMEN AND PELVIS WITHOUT CONTRAST TECHNIQUE: Multidetector CT imaging of the chest, abdomen and pelvis was performed following the standard protocol without IV contrast. COMPARISON:  06/20/2017 and previous FINDINGS: CT CHEST FINDINGS Cardiovascular: Coronary and Aortic Atherosclerosis (ICD10-170.0). Tortuous descending thoracic aorta. Heart size normal. No pericardial effusion. Mediastinum/Nodes: Large hiatal hernia involving virtually the entire stomach. No hilar or mediastinal adenopathy. No mediastinal hematoma. Lungs/Pleura: No pneumothorax. Coarse peripheral airspace opacities. Significant improvement in the pleural effusions seen previously with minimal trace left residual. Improved aeration in the lung bases with scattered  coarse airspace opacities in the posterior right lower lobe and medial left lower lobe with atelectasis.  Musculoskeletal: No acute fracture. Thoracic dextroscoliosis with anterior vertebral endplate spurring at multiple levels in the mid and lower thoracic spine. Bilateral shoulder DJD. CT ABDOMEN PELVIS FINDINGS Hepatobiliary: No focal liver abnormality is seen. No gallstones, gallbladder wall thickening, or biliary dilatation. Pancreas: Unremarkable. No pancreatic ductal dilatation or surrounding inflammatory changes. Spleen: Normal in size without focal abnormality. Adrenals/Urinary Tract: No evidence of adrenal mass. No hydronephrosis. No evidence of renal mass. Urinary bladder physiologically distended. Stomach/Bowel: Virtually the entire stomach is involved in a large hiatal hernia. Small bowel is nondilated. Normal appendix. Scattered colonic diverticula most numerous in the distal descending and sigmoid segments, without significant adjacent inflammatory/edematous change or abscess. Vascular/Lymphatic: Aortoiliac arterial calcifications without aneurysm. No abdominal or pelvic adenopathy. Reproductive: Uterus and bilateral adnexa are unremarkable. Other: No ascites. Left pelvic phleboliths. No free air. Musculoskeletal: Subcapital left femoral neck fracture with impaction and displacement, resulting varus deformity. No pelvic ring fracture. Mild levoscoliosis in the lumbar spine with multilevel spondylitic change. IMPRESSION: 1. Left subcapital femoral neck fracture with impaction and displacement. 2. Large hiatal hernia involving the entire stomach. 3. Coronary and Aortic Atherosclerosis (ICD10-I70.0). 4. Colonic diverticulosis. Electronically Signed   By: Lucrezia Europe M.D.   On: 09/01/2018 16:23   Ct Cervical Spine Wo Contrast  Result Date: 09/01/2018 CLINICAL DATA:  Fall. EXAM: CT HEAD WITHOUT CONTRAST CT CERVICAL SPINE WITHOUT CONTRAST TECHNIQUE: Multidetector CT imaging of the head and cervical spine was performed following the standard protocol without intravenous contrast. Multiplanar CT image  reconstructions of the cervical spine were also generated. COMPARISON:  06/13/2017 FINDINGS: CT HEAD FINDINGS Brain: No acute infarct, intracranial hemorrhage, mass effect, or extra-axial fluid collection is identified. Cerebral white matter hypodensities are similar to the prior study and nonspecific but compatible with mild chronic small vessel ischemic disease. Mild cerebral atrophy is also unchanged. Vascular: Calcified atherosclerosis at the skull base. No hyperdense vessel. Skull: No fracture or suspicious osseous lesion. Sinuses/Orbits: Clear paranasal sinuses. Small chronic right mastoid effusion. Bilateral cataract extraction. Other: None. CT CERVICAL SPINE FINDINGS Alignment: Unchanged minimal retrolisthesis of C3 on C4 and C5 on C6 and minimal anterolisthesis of C6 on C7 and C7 on T1. Skull base and vertebrae: No acute fracture or suspicious osseous lesion. Mild median C1-2 arthropathy. Soft tissues and spinal canal: No prevertebral fluid or swelling. No visible canal hematoma. Disc levels: Similar appearance of multilevel disc degeneration including up to severe disc space narrowing from C3-4 to C6-7. Moderate multilevel neural foraminal stenosis due to uncovertebral spurring. Upper chest: Asymmetric right apical pleuroparenchymal scarring. Other: Diminutive or absent left thyroid lobe. Small hypodense nodules in the right thyroid lobe measuring up to approximately 1.2 cm, not significantly changed. Moderate calcified atherosclerosis at the carotid bifurcations. IMPRESSION: 1. No evidence of acute intracranial abnormality. 2. No evidence of acute fracture or traumatic subluxation in the cervical spine. Electronically Signed   By: Logan Bores M.D.   On: 09/01/2018 16:28   Dg Chest Port 1 View  Result Date: 09/02/2018 CLINICAL DATA:  Shortness of breath. EXAM: PORTABLE CHEST 1 VIEW COMPARISON:  CT chest and chest x-ray from yesterday. FINDINGS: Small stable cardiomediastinal silhouette and large hiatal  hernia. Atherosclerotic calcification of the aortic arch. Normal pulmonary vascularity. Low lung volumes with bibasilar atelectasis. Unchanged trace left pleural effusion. No consolidation or pneumothorax. No acute osseous abnormality. IMPRESSION: 1. Bibasilar atelectasis.  Unchanged trace left pleural effusion. Electronically  Signed   By: Titus Dubin M.D.   On: 09/02/2018 09:18   Dg Chest Port 1 View  Result Date: 09/01/2018 CLINICAL DATA:  Fall. EXAM: PORTABLE CHEST 1 VIEW COMPARISON:  June 20, 2017. FINDINGS: There is a large hiatal hernia again identified. The heart, hila, mediastinum, pleura are unremarkable. There are small pleural effusions, left greater than right. Mild interstitial prominence without overt edema. No new infiltrates. IMPRESSION: 1. Small effusions, left greater than right. 2. Large hiatal hernia. 3. Pulmonary venous congestion without overt edema. Electronically Signed   By: Dorise Bullion III M.D   On: 09/01/2018 15:27   Dg Hip Unilat W Or Wo Pelvis 2-3 Views Left  Result Date: 09/01/2018 CLINICAL DATA:  Status post fall, left hip fracture EXAM: DG HIP (WITH OR WITHOUT PELVIS) 2-3V LEFT COMPARISON:  None. FINDINGS: Severe osteopenia. Displaced left subcapital femoral neck fracture.  No dislocation. No other acute fracture or dislocation. IMPRESSION: 1. Acute displaced left subcapital femoral neck fracture. Electronically Signed   By: Kathreen Devoid   On: 09/01/2018 15:28     Antimicrobials:   PERI-OPERATIVE PER ORTHO   Subjective: Pt with RR last PM 2/2 low o2 sats, rapid recovery with NRB, in this pt with OSA not on CPAP. Pt is awake and alert but advanced dementia limits interaction  Objective: Vitals:   09/02/18 0139 09/02/18 0200 09/02/18 0427 09/02/18 1254  BP:   114/65 92/64  Pulse:   (!) 113 89  Resp:   18 20  Temp:   98.4 F (36.9 C) 98.2 F (36.8 C)  TempSrc:   Oral Oral  SpO2: 94% 92% 92% 90%  Weight:      Height:        Intake/Output  Summary (Last 24 hours) at 09/02/2018 1403 Last data filed at 09/02/2018 1337 Gross per 24 hour  Intake 940.26 ml  Output 600 ml  Net 340.26 ml   Filed Weights   09/01/18 1431  Weight: 72.6 kg    Examination:  General exam: calm, hemodynamically stable, confused at baseline Respiratory system: Clear to auscultation. Respiratory effort normal. Cardiovascular system: S1 & S2 heard, RRR. No JVD, murmurs, rubs, gallops or clicks. No pedal edema. Gastrointestinal system: Abdomen is nondistended, S/NT, Normal bowel sounds heard. Central nervous system: Alert and oriented. No focal neurological deficits. Extremities: Left LE externally rotated, wwp Skin: No rashes, lesions or ulcers Psychiatry: Judgement and insight appear normal. Mood & affect appropriate.     Data Reviewed: I have personally reviewed following labs and imaging studies  CBC: Recent Labs  Lab 09/01/18 1501 09/02/18 0234  WBC 2.9* 7.7  NEUTROABS 1.6*  --   HGB 13.2 12.5  HCT 40.1 37.3  MCV 102.0* 101.4*  PLT 140* 024*   Basic Metabolic Panel: Recent Labs  Lab 09/01/18 1501 09/02/18 0234  NA 139 140  K 5.0 4.2  CL 97* 99  CO2 28 28  GLUCOSE 93 186*  BUN 40* 42*  CREATININE 1.45* 1.31*  CALCIUM 9.6 9.1   GFR: Estimated Creatinine Clearance: 25.1 mL/min (A) (by C-G formula based on SCr of 1.31 mg/dL (H)). Liver Function Tests: Recent Labs  Lab 09/01/18 1501  AST 20  ALT 19  ALKPHOS 80  BILITOT 0.5  PROT 6.8  ALBUMIN 4.1   No results for input(s): LIPASE, AMYLASE in the last 168 hours. No results for input(s): AMMONIA in the last 168 hours. Coagulation Profile: Recent Labs  Lab 09/01/18 1501 09/02/18 0234  INR 0.9 1.1  Cardiac Enzymes: No results for input(s): CKTOTAL, CKMB, CKMBINDEX, TROPONINI in the last 168 hours. BNP (last 3 results) No results for input(s): PROBNP in the last 8760 hours. HbA1C: Recent Labs    09/01/18 1501  HGBA1C 5.9*   CBG: No results for input(s): GLUCAP  in the last 168 hours. Lipid Profile: No results for input(s): CHOL, HDL, LDLCALC, TRIG, CHOLHDL, LDLDIRECT in the last 72 hours. Thyroid Function Tests: No results for input(s): TSH, T4TOTAL, FREET4, T3FREE, THYROIDAB in the last 72 hours. Anemia Panel: No results for input(s): VITAMINB12, FOLATE, FERRITIN, TIBC, IRON, RETICCTPCT in the last 72 hours. Sepsis Labs: No results for input(s): PROCALCITON, LATICACIDVEN in the last 168 hours.  Recent Results (from the past 240 hour(s))  SARS Coronavirus 2 (CEPHEID - Performed in [place] hospital lab), Hosp Order     Status: None   Collection Time: 09/01/18  3:44 PM  Result Value Ref Range Status   SARS Coronavirus 2 NEGATIVE NEGATIVE Final    Comment: (NOTE) If result is NEGATIVE SARS-CoV-2 target nucleic acids are NOT DETECTED. The SARS-CoV-2 RNA is generally detectable in upper and lower  respiratory specimens during the acute phase of infection. The lowest  concentration of SARS-CoV-2 viral copies this assay can detect is 250  copies / mL. A negative result does not preclude SARS-CoV-2 infection  and should not be used as the sole basis for treatment or other  patient management decisions.  A negative result may occur with  improper specimen collection / handling, submission of specimen other  than nasopharyngeal swab, presence of viral mutation(s) within the  areas targeted by this assay, and inadequate number of viral copies  (<250 copies / mL). A negative result must be combined with clinical  observations, patient history, and epidemiological information. If result is POSITIVE SARS-CoV-2 target nucleic acids are DETECTED. The SARS-CoV-2 RNA is generally detectable in upper and lower  respiratory specimens dur ing the acute phase of infection.  Positive  results are indicative of active infection with SARS-CoV-2.  Clinical  correlation with patient history and other diagnostic information is  necessary to determine patient  infection status.  Positive results do  not rule out bacterial infection or co-infection with other viruses. If result is PRESUMPTIVE POSTIVE SARS-CoV-2 nucleic acids MAY BE PRESENT.   A presumptive positive result was obtained on the submitted specimen  and confirmed on repeat testing.  While 2019 novel coronavirus  (SARS-CoV-2) nucleic acids may be present in the submitted sample  additional confirmatory testing may be necessary for epidemiological  and / or clinical management purposes  to differentiate between  SARS-CoV-2 and other Sarbecovirus currently known to infect humans.  If clinically indicated additional testing with an alternate test  methodology [phone]) is advised. The SARS-CoV-2 RNA is generally  detectable in upper and lower respiratory sp ecimens during the acute  phase of infection. The expected result is Negative. Fact Sheet for Patients:  StrictlyIdeas.no Fact Sheet for Healthcare Providers: BankingDealers.co.za This test is not yet approved or cleared by the [place] FDA and has been authorized for detection and/or diagnosis of SARS-CoV-2 by FDA under an Emergency Use Authorization (EUA).  This EUA will remain in effect (meaning this test can be used) for the duration of the COVID-19 declaration under Section 564(b)(1) of the Act, 21 U.S.C. section 360bbb-3(b)(1), unless the authorization is terminated or revoked sooner. Performed at [facility], [place] [address], [place], [place] [address]   Surgical pcr screen  Status: Abnormal   Collection Time: 09/01/18  8:30 PM  Result Value Ref Range Status   MRSA, PCR POSITIVE (A) NEGATIVE Final    Comment: RESULT CALLED TO, READ BACK BY AND VERIFIED WITH: HANDY,C RN 09/01/2018 AT 2203 SKEEN,P    Staphylococcus aureus POSITIVE (A) NEGATIVE Final    Comment: (NOTE) The Xpert SA Assay (FDA approved for NASAL specimens in patients 76 years of age and older),  is one component of a comprehensive surveillance program. It is not intended to diagnose infection nor to guide or monitor treatment. Performed at [facility], [place] [address], [place], [place] [address]          Radiology Studies: Ct Abdomen Pelvis Wo Contrast  Result Date: 09/01/2018 CLINICAL DATA:  [place], left lower extremity pain EXAM: CT CHEST, ABDOMEN AND PELVIS WITHOUT CONTRAST TECHNIQUE: Multidetector CT imaging of the chest, abdomen and pelvis was performed following the standard protocol without IV contrast. COMPARISON:  06/20/2017 and previous FINDINGS: CT CHEST FINDINGS Cardiovascular: Coronary and Aortic Atherosclerosis (ICD10-170.0). Tortuous descending thoracic aorta. Heart size normal. No pericardial effusion. Mediastinum/Nodes: Large hiatal hernia involving virtually the entire stomach. No hilar or mediastinal adenopathy. No mediastinal hematoma. Lungs/Pleura: No pneumothorax. Coarse peripheral airspace opacities. Significant improvement in the pleural effusions seen previously with minimal trace left residual. Improved aeration in the lung bases with scattered coarse airspace opacities in the posterior right lower lobe and medial left lower lobe with atelectasis. Musculoskeletal: No acute fracture. Thoracic dextroscoliosis with anterior vertebral endplate spurring at multiple levels in the mid and lower thoracic spine. Bilateral shoulder DJD. CT ABDOMEN PELVIS FINDINGS Hepatobiliary: No focal liver abnormality is seen. No gallstones, gallbladder wall thickening, or biliary dilatation. Pancreas: Unremarkable. No pancreatic ductal dilatation or surrounding inflammatory changes. Spleen: Normal in size without focal abnormality. Adrenals/Urinary Tract: No evidence of adrenal mass. No hydronephrosis. No evidence of renal mass. Urinary bladder physiologically distended. Stomach/Bowel: Virtually the entire stomach is involved in a large hiatal hernia. Small bowel is nondilated. Normal  appendix. Scattered colonic diverticula most numerous in the distal descending and sigmoid segments, without significant adjacent inflammatory/edematous change or abscess. Vascular/Lymphatic: Aortoiliac arterial calcifications without aneurysm. No abdominal or pelvic adenopathy. Reproductive: Uterus and bilateral adnexa are unremarkable. Other: No ascites. Left pelvic phleboliths. No free air. Musculoskeletal: Subcapital left femoral neck fracture with impaction and displacement, resulting varus deformity. No pelvic ring fracture. Mild levoscoliosis in the lumbar spine with multilevel spondylitic change. IMPRESSION: 1. Left subcapital femoral neck fracture with impaction and displacement. 2. Large hiatal hernia involving the entire stomach. 3. Coronary and Aortic Atherosclerosis (ICD10-I70.0). 4. Colonic diverticulosis. Electronically Signed   By: Lucrezia Europe M.D.   On: 09/01/2018 16:23   Ct Head Wo Contrast  Result Date: 09/01/2018 CLINICAL DATA:  Fall. EXAM: CT HEAD WITHOUT CONTRAST CT CERVICAL SPINE WITHOUT CONTRAST TECHNIQUE: Multidetector CT imaging of the head and cervical spine was performed following the standard protocol without intravenous contrast. Multiplanar CT image reconstructions of the cervical spine were also generated. COMPARISON:  06/13/2017 FINDINGS: CT HEAD FINDINGS Brain: No acute infarct, intracranial hemorrhage, mass effect, or extra-axial fluid collection is identified. Cerebral white matter hypodensities are similar to the prior study and nonspecific but compatible with mild chronic small vessel ischemic disease. Mild cerebral atrophy is also unchanged. Vascular: Calcified atherosclerosis at the skull base. No hyperdense vessel. Skull: No fracture or suspicious osseous lesion. Sinuses/Orbits: Clear paranasal sinuses. Small chronic right mastoid effusion. Bilateral cataract extraction. Other: None. CT CERVICAL SPINE FINDINGS Alignment:  Unchanged minimal retrolisthesis of C3 on C4 and C5  on C6 and minimal anterolisthesis of C6 on C7 and C7 on T1. Skull base and vertebrae: No acute fracture or suspicious osseous lesion. Mild median C1-2 arthropathy. Soft tissues and spinal canal: No prevertebral fluid or swelling. No visible canal hematoma. Disc levels: Similar appearance of multilevel disc degeneration including up to severe disc space narrowing from C3-4 to C6-7. Moderate multilevel neural foraminal stenosis due to uncovertebral spurring. Upper chest: Asymmetric right apical pleuroparenchymal scarring. Other: Diminutive or absent left thyroid lobe. Small hypodense nodules in the right thyroid lobe measuring up to approximately 1.2 cm, not significantly changed. Moderate calcified atherosclerosis at the carotid bifurcations. IMPRESSION: 1. No evidence of acute intracranial abnormality. 2. No evidence of acute fracture or traumatic subluxation in the cervical spine. Electronically Signed   By: Logan Bores M.D.   On: 09/01/2018 16:28   Ct Chest Wo Contrast  Result Date: 09/01/2018 CLINICAL DATA:  [place], left lower extremity pain EXAM: CT CHEST, ABDOMEN AND PELVIS WITHOUT CONTRAST TECHNIQUE: Multidetector CT imaging of the chest, abdomen and pelvis was performed following the standard protocol without IV contrast. COMPARISON:  06/20/2017 and previous FINDINGS: CT CHEST FINDINGS Cardiovascular: Coronary and Aortic Atherosclerosis (ICD10-170.0). Tortuous descending thoracic aorta. Heart size normal. No pericardial effusion. Mediastinum/Nodes: Large hiatal hernia involving virtually the entire stomach. No hilar or mediastinal adenopathy. No mediastinal hematoma. Lungs/Pleura: No pneumothorax. Coarse peripheral airspace opacities. Significant improvement in the pleural effusions seen previously with minimal trace left residual. Improved aeration in the lung bases with scattered coarse airspace opacities in the posterior right lower lobe and medial left lower lobe with atelectasis. Musculoskeletal: No  acute fracture. Thoracic dextroscoliosis with anterior vertebral endplate spurring at multiple levels in the mid and lower thoracic spine. Bilateral shoulder DJD. CT ABDOMEN PELVIS FINDINGS Hepatobiliary: No focal liver abnormality is seen. No gallstones, gallbladder wall thickening, or biliary dilatation. Pancreas: Unremarkable. No pancreatic ductal dilatation or surrounding inflammatory changes. Spleen: Normal in size without focal abnormality. Adrenals/Urinary Tract: No evidence of adrenal mass. No hydronephrosis. No evidence of renal mass. Urinary bladder physiologically distended. Stomach/Bowel: Virtually the entire stomach is involved in a large hiatal hernia. Small bowel is nondilated. Normal appendix. Scattered colonic diverticula most numerous in the distal descending and sigmoid segments, without significant adjacent inflammatory/edematous change or abscess. Vascular/Lymphatic: Aortoiliac arterial calcifications without aneurysm. No abdominal or pelvic adenopathy. Reproductive: Uterus and bilateral adnexa are unremarkable. Other: No ascites. Left pelvic phleboliths. No free air. Musculoskeletal: Subcapital left femoral neck fracture with impaction and displacement, resulting varus deformity. No pelvic ring fracture. Mild levoscoliosis in the lumbar spine with multilevel spondylitic change. IMPRESSION: 1. Left subcapital femoral neck fracture with impaction and displacement. 2. Large hiatal hernia involving the entire stomach. 3. Coronary and Aortic Atherosclerosis (ICD10-I70.0). 4. Colonic diverticulosis. Electronically Signed   By: Lucrezia Europe M.D.   On: 09/01/2018 16:23   Ct Cervical Spine Wo Contrast  Result Date: 09/01/2018 CLINICAL DATA:  Fall. EXAM: CT HEAD WITHOUT CONTRAST CT CERVICAL SPINE WITHOUT CONTRAST TECHNIQUE: Multidetector CT imaging of the head and cervical spine was performed following the standard protocol without intravenous contrast. Multiplanar CT image reconstructions of the  cervical spine were also generated. COMPARISON:  06/13/2017 FINDINGS: CT HEAD FINDINGS Brain: No acute infarct, intracranial hemorrhage, mass effect, or extra-axial fluid collection is identified. Cerebral white matter hypodensities are similar to the prior study and nonspecific but compatible with mild chronic small vessel ischemic disease. Mild cerebral atrophy is also unchanged.  Vascular: Calcified atherosclerosis at the skull base. No hyperdense vessel. Skull: No fracture or suspicious osseous lesion. Sinuses/Orbits: Clear paranasal sinuses. Small chronic right mastoid effusion. Bilateral cataract extraction. Other: None. CT CERVICAL SPINE FINDINGS Alignment: Unchanged minimal retrolisthesis of C3 on C4 and C5 on C6 and minimal anterolisthesis of C6 on C7 and C7 on T1. Skull base and vertebrae: No acute fracture or suspicious osseous lesion. Mild median C1-2 arthropathy. Soft tissues and spinal canal: No prevertebral fluid or swelling. No visible canal hematoma. Disc levels: Similar appearance of multilevel disc degeneration including up to severe disc space narrowing from C3-4 to C6-7. Moderate multilevel neural foraminal stenosis due to uncovertebral spurring. Upper chest: Asymmetric right apical pleuroparenchymal scarring. Other: Diminutive or absent left thyroid lobe. Small hypodense nodules in the right thyroid lobe measuring up to approximately 1.2 cm, not significantly changed. Moderate calcified atherosclerosis at the carotid bifurcations. IMPRESSION: 1. No evidence of acute intracranial abnormality. 2. No evidence of acute fracture or traumatic subluxation in the cervical spine. Electronically Signed   By: Logan Bores M.D.   On: 09/01/2018 16:28   Dg Chest Port 1 View  Result Date: 09/02/2018 CLINICAL DATA:  Shortness of breath. EXAM: PORTABLE CHEST 1 VIEW COMPARISON:  CT chest and chest x-ray from yesterday. FINDINGS: Small stable cardiomediastinal silhouette and large hiatal hernia.  Atherosclerotic calcification of the aortic arch. Normal pulmonary vascularity. Low lung volumes with bibasilar atelectasis. Unchanged trace left pleural effusion. No consolidation or pneumothorax. No acute osseous abnormality. IMPRESSION: 1. Bibasilar atelectasis.  Unchanged trace left pleural effusion. Electronically Signed   By: Titus Dubin M.D.   On: 09/02/2018 09:18   Dg Chest Port 1 View  Result Date: 09/01/2018 CLINICAL DATA:  Fall. EXAM: PORTABLE CHEST 1 VIEW COMPARISON:  June 20, 2017. FINDINGS: There is a large hiatal hernia again identified. The heart, hila, mediastinum, pleura are unremarkable. There are small pleural effusions, left greater than right. Mild interstitial prominence without overt edema. No new infiltrates. IMPRESSION: 1. Small effusions, left greater than right. 2. Large hiatal hernia. 3. Pulmonary venous congestion without overt edema. Electronically Signed   By: Dorise Bullion III M.D   On: 09/01/2018 15:27   Dg Hip Unilat W Or Wo Pelvis 2-3 Views Left  Result Date: 09/01/2018 CLINICAL DATA:  Status post fall, left hip fracture EXAM: DG HIP (WITH OR WITHOUT PELVIS) 2-3V LEFT COMPARISON:  None. FINDINGS: Severe osteopenia. Displaced left subcapital femoral neck fracture.  No dislocation. No other acute fracture or dislocation. IMPRESSION: 1. Acute displaced left subcapital femoral neck fracture. Electronically Signed   By: Kathreen Devoid   On: 09/01/2018 15:28        Scheduled Meds:  artificial tears  1 application Both Eyes BID   chlorhexidine  60 mL Topical Once   Chlorhexidine Gluconate Cloth  6 each Topical Q0600   divalproex  125 mg Oral TID   escitalopram  10 mg Oral Daily   ketotifen  1 drop Both Eyes BID   levothyroxine  100 mcg Oral QAC breakfast   Melatonin  7.5 mg Oral QHS   mupirocin ointment  1 application Nasal BID   OLANZapine  2.5 mg Oral q morning - 10a   OLANZapine  7.5 mg Oral QHS   povidone-iodine  2 application Topical Once    Continuous Infusions:   ceFAZolin (ANCEF) IV     dextrose 5 % and 0.45% NaCl Stopped (09/01/18 1938)   tranexamic acid       LOS: 1  day    Time spent: 35 min    Nicolette Bang, MD [facility]  If 7PM-7AM, please contact night-coverage  09/02/2018, 2:03 PM

## 2018-09-02 NOTE — Progress Notes (Addendum)
I have had a discussion with anesthesia, as well as a family conference with her power of attorney's.  It is not clear if they wish to proceed with surgical intervention, they are reconsidering her DNR status as well.  We have discussed getting an assessment from palliative care, her pulmonary function is definitely concerning, and there is very high likelihood that she may need to remain on the ventilator after surgery depending on her pulmonary capacity.  She apparently has a significant hiatal hernia that impacts her pulmonary function.  We will plan to cancel surgery for today, involve palliative care (I have placed the consult) and continue the discussion.  Orthopedics will be available to perform a hemiarthroplasty if it is ultimately decided that those are the family's wishes.  Johnny Bridge, MD

## 2018-09-02 NOTE — Progress Notes (Signed)
Notes reviewed.   Updated family that surgery is on hold for now.  Await further medical evaluation since rapid response called last night.   Surgery today if she is in fact optimized, although sounds doubtful...  Johnny Bridge, MD

## 2018-09-02 NOTE — Anesthesia Preprocedure Evaluation (Deleted)
Anesthesia Evaluation    Airway        Dental   Pulmonary former smoker,           Cardiovascular hypertension,      Neuro/Psych    GI/Hepatic   Endo/Other  diabetes  Renal/GU      Musculoskeletal   Abdominal   Peds  Hematology   Anesthesia Other Findings   Reproductive/Obstetrics                             Anesthesia Physical Anesthesia Plan  ASA:   Anesthesia Plan:    Post-op Pain Management:    Induction:   PONV Risk Score and Plan:   Airway Management Planned:   Additional Equipment:   Intra-op Plan:   Post-operative Plan:   Informed Consent:   Plan Discussed with:   Anesthesia Plan Comments: (Postponed, possibly cancelled due to consideration for palliative care. )        Anesthesia Quick Evaluation

## 2018-09-02 NOTE — Progress Notes (Signed)
Paged NP Blount regarding pt being hypoxic and desatting in the 80s, then 70s  on 5L [place]. Pt is not in distress and is alert and following commands of deep breathing and coughing. Pt O2 sats 90-92 after deep breathing and coughing. Lung sounds diminished bilaterally no crackles heard. Pt is showing no signs of respiratory distress. HR 111. Will continue to monitor pt while await call from NP Blount.

## 2018-09-03 DIAGNOSIS — M8580 Other specified disorders of bone density and structure, unspecified site: Secondary | ICD-10-CM

## 2018-09-03 DIAGNOSIS — F0280 Dementia in other diseases classified elsewhere without behavioral disturbance: Secondary | ICD-10-CM

## 2018-09-03 DIAGNOSIS — Z515 Encounter for palliative care: Secondary | ICD-10-CM

## 2018-09-03 MED ORDER — SENNOSIDES-DOCUSATE SODIUM 8.6-50 MG PO TABS
2.0000 | ORAL_TABLET | Freq: Every day | ORAL | Status: DC
  Administered 2018-09-03 – 2018-09-04 (×2): 2 via ORAL
  Filled 2018-09-03 (×2): qty 2

## 2018-09-03 MED ORDER — ACETAMINOPHEN 160 MG/5ML PO SOLN
650.0000 mg | Freq: Three times a day (TID) | ORAL | Status: DC
  Administered 2018-09-04: 650 mg via ORAL
  Filled 2018-09-03 (×4): qty 20.3

## 2018-09-03 MED ORDER — OXYCODONE HCL 5 MG PO TABS: 2.5000 mg | ORAL_TABLET | ORAL | Status: DC | PRN

## 2018-09-03 MED ORDER — OLANZAPINE 5 MG PO TBDP
7.5000 mg | ORAL_TABLET | Freq: Every day | ORAL | Status: DC
  Administered 2018-09-03: 7.5 mg via ORAL
  Filled 2018-09-03 (×2): qty 1.5

## 2018-09-03 MED ORDER — OLANZAPINE 5 MG PO TBDP
2.5000 mg | ORAL_TABLET | Freq: Every morning | ORAL | Status: DC
  Administered 2018-09-03 – 2018-09-04 (×2): 2.5 mg via ORAL
  Filled 2018-09-03 (×2): qty 0.5

## 2018-09-03 NOTE — Progress Notes (Addendum)
Patient ID: Cristina Middleton, female   DOB: [birth year], 85 y.o.   MRN: [id number]     Subjective:  Patient reports pain as mild.  Patient very demented and in the bed but in no acute distress.  Unaware of time and place.  Objective:   VITALS:   Vitals:   09/02/18 0200 09/02/18 0427 09/02/18 1254 09/03/18 0450  BP:  114/65 92/64 102/73  Pulse:  (!) 113 89 95  Resp:  18 20 16   Temp:  98.4 F (36.9 C) 98.2 F (36.8 C) 99.4 F (37.4 C)  TempSrc:  Oral Oral Oral  SpO2: 92% 92% 90%   Weight:      Height:        ABD soft Sensation intact distally Dorsiflexion/Plantar flexion intact Pain with log roll No other motion tested Left lower ext short with external rotation  Lab Results  Component Value Date   WBC 7.7 09/02/2018   HGB 12.5 09/02/2018   HCT 37.3 09/02/2018   MCV 101.4 (H) 09/02/2018   PLT 130 (L) 09/02/2018   BMET    Component Value Date/Time   NA 140 09/02/2018 0234   K 4.2 09/02/2018 0234   CL 99 09/02/2018 0234   CO2 28 09/02/2018 0234   GLUCOSE 186 (H) 09/02/2018 0234   BUN 42 (H) 09/02/2018 0234   CREATININE 1.31 (H) 09/02/2018 0234   CALCIUM 9.1 09/02/2018 0234   GFRNONAA 35 (L) 09/02/2018 0234   GFRAA 40 (L) 09/02/2018 0234     Assessment/Plan: 1 Day Post-Op   Principal Problem:   Closed left hip fracture (HCC) Active Problems:   HTN (hypertension)   Diabetes mellitus, type 2 (HCC)   Sleep apnea   Hiatal hernia with GERD   Hypothyroidism   Fall at nursing home   Osteopenia   Hypoxemia   AKI (acute kidney injury) ([place])   Leukopenia   Thrombocytopenia (HCC)   Pressure injury of skin   Advance diet Bed rest NWB left lower ext Continue plan per medicine   Lunette Stands 09/03/2018, 9:40 AM  Discussed and agree with above.  Palliative care consult pending.    Marchia Bond, MD Cell [phone]

## 2018-09-03 NOTE — Progress Notes (Signed)
   09/03/18 0800  Clinical Encounter Type  Visited With Health care provider;Patient  Visit Type Initial;Social support;Psychological support  Spiritual Encounters  Spiritual Needs Emotional  Stress Factors  Patient Stress Factors Lack of knowledge;Other (Comment) (dementia, as reported to me by RN)   Present w/ RN, pt kept asking why no one would take her to the hospital and why no one would call for the ambulance.  Rest of conversation was similar.    Myra Gianotti resident, [phone]

## 2018-09-03 NOTE — Social Work (Signed)
At the request of PMT have contacted [facility] at [phone] memory care. Spoke with Esaw Dace, RN who states that pt would need to go to SNF in some capacity prior to returning to ALF.   If family elects to not proceed with surgery but still want pt to get therapies then SNF placement can be covered under Medicare and pt can be followed by palliative services until she returns to ALF. If pt family prefers pt to go to SNF with hospice care then that stay will not be covered by Medicare and the SNF can charge room and board for pt (since this is not covered by Hospice benefit).   This has been secure messaged to [employer] with PMT who is supporting family with decision making and next steps.   CSW continuing to follow for support with disposition when medically appropriate.  Westley Hummer, MSW, [place] [employer] [phone]

## 2018-09-03 NOTE — Progress Notes (Signed)
PROGRESS NOTE    Serenity Batley [facility]  ZOX:[id number] DOB: 10/20/1920 DOA: 09/01/2018 PCP: Jani Gravel, MD   Brief Narrative:  Per HPI: Lea Baine Forbisis a 85 y.o.femalewithadvanced dementia, DM-2, hypertension, OSA not on CPAP, hypothyroidismandanxiety presenting from [facility] memory unit/ALF with fall.Patient has advanced dementia and was not able to provide history. Per EDP note and patient's daughter, patient fell cemented past 12 PM when she was trying to reach for her walker.Staff found her on the floor and had obvious left hip deformity and called EMS. Found to be hypoxemic to 85% per EMS report to EDP.She was also hypoxemic to 85% in ED. She easily recovered to upper 90s on 2 L by nasal cannula. Per daughter, no recent illness or medication change she is aware of. Reportedly has no URI symptoms, cough or shortness of breath. Not on blood thinner. Does not wear CPAP. Walks with walker at baseline.  In ED, hemodynamically stable. Hypoxemic to 85% and recovered to upper 90s on 2 L by nasal cannula. CBC with leukopenia to 2.9 (new), macrocytosis to 102 and thrombocytopenia to 140 (baseline).CMP significant for creatinine to 1.45 (baseline 0.67).Troponin negative. PT/INR within normal limits. COVID-19 negative. CT head and cervical spine negative for acute finding. CXR with small effusion, large atrial hernia, PV congestion without overt edema. DG left hip showed acute displaced left subcapital femoral neck fracture and severe osteopenia. CT chest, abdomen and pelvis without contrast confirmed the fracture with impaction and displacement, large ureteral hernia involving the entire stomach and colonic diverticulosis. Orthopedic surgery Dr. Bradly Chris by [facility]. Typed and screened. Received fentanyl and normal saline bolus 500 cc and hospital service was called for admission.  Reached out to patient's daughter, Ms. Laymond Purser who is her healthcare power of attorney.Updated  her about the above finding.Patient's daughter wants her mother to be DNR/DNI in case of cardiopulmonary arrest. She also has a living will stating that patient does not want aggressive life prolonging measures such as feeding tube.Patient daughter will send over the living will to the hospital.   Assessment & Plan:   Principal Problem:   Closed left hip fracture (HCC) Active Problems:   HTN (hypertension)   Diabetes mellitus, type 2 (HCC)   Sleep apnea   Hiatal hernia with GERD   Hypothyroidism   Fall at nursing home   Osteopenia   Hypoxemia   AKI (acute kidney injury) ([place])   Leukopenia   Thrombocytopenia (HCC)   Pressure injury of skin   Dementia associated with other underlying disease without behavioral disturbance ([place])   Palliative care encounter   Left subcapital femoral neck fracture with impaction and displacement. Fall at assisted living facility Severe osteopenia -Orthopedic surgery, Dr. Bradly Chris in ED is seen patient with possible plans for operative management per family request -Family discussed case with palliative care, family meeting tomorrow at 61 by phone to further discuss plan of care to proceed with operative intervention versus discharge home. -Pain management with Dilaudid -D5-0.45% NS at 50 cc/h -N.p.o. except sips with meds hemoglobin A1c -SCD for VT prophylaxis -Patient has no cardiac risk factor from surgical standpoint. Recent echo in 2/20 normal.Hypoxemia likely due to large hiatal hernia versus cardiopulmonary source. Major risk factor is the surgery itself and her age.  Acute hypoxic respiratory failure rapidly recovered. -Chest x-ray obtained-clear no edema no evidence of CHF -Initial rapid response is likely secondary to obstructive sleep apnea not on CPAP with rapid recovery during rapid response and was complicated by LARGE hiatal hernia noted on  CT  YJE:HUDJSHF prerenal etiology. Also on Maxzide. Baseline creatinine  0.65.  -IV fluid as above, recheck tomorrow currently 1.31 down from 1.45 -Avoid nephrotoxic meds -Monitor daily  Advanced dementia:Awake and alert but disoriented -High risk for postop delirium, frequent reorientation/delirium precaution -Continue home olanzapine after med rec.  History of thrombocytopenia -Platelets 130,  182 in February 2018 -Recheck CBC in the a.m -NO BLEEDING, MONITOR.  Large hiatal hernia:Noted on CXR and CT chest.This could contribute to their hypoxemia. -Head of bed elevation to 30 degree if no objection from orthopedic standpoint  DM-2:Does not seem tobemedication. BMP glucose 93. -Check A1c -We will initiate glucose surveillance and sliding scale if needed.  OSA: Not on CPAP -PRN oxygen as above  Hypertension: Normotensive -Hold home Maxzide in the setting of AKI  DVT prophylaxis: SCD/Compression stockings  Code Status:     Code Status Orders  (From admission, onward)         Start     Ordered   09/01/18 1739  Do not attempt resuscitation (DNR)  Continuous    Question Answer Comment  In the event of cardiac or respiratory ARREST Do not call a code blue   In the event of cardiac or respiratory ARREST Do not perform Intubation, CPR, defibrillation or ACLS   In the event of cardiac or respiratory ARREST Use medication by any route, position, wound care, and other measures to relive pain and suffering. May use oxygen, suction and manual treatment of airway obstruction as needed for comfort.      09/01/18 1743        Code Status History    Date Active Date Inactive Code Status Order ID Comments User Context   06/17/2017 1754 06/22/2017 1954 DNR [id number]  Bonnell Public, MD Inpatient   06/14/2017 0339 06/17/2017 1754 Full Code [id number]  Vianne Bulls, MD ED   12/09/2016 0153 12/10/2016 1631 Full Code [id number]  Reubin Milan, MD Inpatient   06/14/2016 1743 06/16/2016 1806 Full Code [id number]  Janece Canterbury, MD  Inpatient   06/13/2016 2023 06/14/2016 1743 DNR [id number]  Janece Canterbury, MD Inpatient   06/28/2014 0235 07/02/2014 1505 DNR [id number]  Ivor Costa, MD Inpatient     Family Communication: none today=palliative discussing phoen meeting 430 tuesday  Disposition Plan:   Patient will remain inpatient additional day for further evaluation for operative intervention for repair of hip fracture versus palliative intervention.  We will continue with IV analgesics, frequent monitoring. Consults called: None Admission status: Inpatient   Consultants:   ortho  Procedures:  Ct Abdomen Pelvis Wo Contrast  Result Date: 09/01/2018 CLINICAL DATA:  [place], left lower extremity pain EXAM: CT CHEST, ABDOMEN AND PELVIS WITHOUT CONTRAST TECHNIQUE: Multidetector CT imaging of the chest, abdomen and pelvis was performed following the standard protocol without IV contrast. COMPARISON:  06/20/2017 and previous FINDINGS: CT CHEST FINDINGS Cardiovascular: Coronary and Aortic Atherosclerosis (ICD10-170.0). Tortuous descending thoracic aorta. Heart size normal. No pericardial effusion. Mediastinum/Nodes: Large hiatal hernia involving virtually the entire stomach. No hilar or mediastinal adenopathy. No mediastinal hematoma. Lungs/Pleura: No pneumothorax. Coarse peripheral airspace opacities. Significant improvement in the pleural effusions seen previously with minimal trace left residual. Improved aeration in the lung bases with scattered coarse airspace opacities in the posterior right lower lobe and medial left lower lobe with atelectasis. Musculoskeletal: No acute fracture. Thoracic dextroscoliosis with anterior vertebral endplate spurring at multiple levels in the mid and lower thoracic spine. Bilateral shoulder DJD. CT ABDOMEN PELVIS FINDINGS Hepatobiliary: No focal liver  abnormality is seen. No gallstones, gallbladder wall thickening, or biliary dilatation. Pancreas: Unremarkable. No pancreatic ductal dilatation or surrounding  inflammatory changes. Spleen: Normal in size without focal abnormality. Adrenals/Urinary Tract: No evidence of adrenal mass. No hydronephrosis. No evidence of renal mass. Urinary bladder physiologically distended. Stomach/Bowel: Virtually the entire stomach is involved in a large hiatal hernia. Small bowel is nondilated. Normal appendix. Scattered colonic diverticula most numerous in the distal descending and sigmoid segments, without significant adjacent inflammatory/edematous change or abscess. Vascular/Lymphatic: Aortoiliac arterial calcifications without aneurysm. No abdominal or pelvic adenopathy. Reproductive: Uterus and bilateral adnexa are unremarkable. Other: No ascites. Left pelvic phleboliths. No free air. Musculoskeletal: Subcapital left femoral neck fracture with impaction and displacement, resulting varus deformity. No pelvic ring fracture. Mild levoscoliosis in the lumbar spine with multilevel spondylitic change. IMPRESSION: 1. Left subcapital femoral neck fracture with impaction and displacement. 2. Large hiatal hernia involving the entire stomach. 3. Coronary and Aortic Atherosclerosis (ICD10-I70.0). 4. Colonic diverticulosis. Electronically Signed   By: Lucrezia Europe M.D.   On: 09/01/2018 16:23   Ct Head Wo Contrast  Result Date: 09/01/2018 CLINICAL DATA:  Fall. EXAM: CT HEAD WITHOUT CONTRAST CT CERVICAL SPINE WITHOUT CONTRAST TECHNIQUE: Multidetector CT imaging of the head and cervical spine was performed following the standard protocol without intravenous contrast. Multiplanar CT image reconstructions of the cervical spine were also generated. COMPARISON:  06/13/2017 FINDINGS: CT HEAD FINDINGS Brain: No acute infarct, intracranial hemorrhage, mass effect, or extra-axial fluid collection is identified. Cerebral white matter hypodensities are similar to the prior study and nonspecific but compatible with mild chronic small vessel ischemic disease. Mild cerebral atrophy is also unchanged. Vascular:  Calcified atherosclerosis at the skull base. No hyperdense vessel. Skull: No fracture or suspicious osseous lesion. Sinuses/Orbits: Clear paranasal sinuses. Small chronic right mastoid effusion. Bilateral cataract extraction. Other: None. CT CERVICAL SPINE FINDINGS Alignment: Unchanged minimal retrolisthesis of C3 on C4 and C5 on C6 and minimal anterolisthesis of C6 on C7 and C7 on T1. Skull base and vertebrae: No acute fracture or suspicious osseous lesion. Mild median C1-2 arthropathy. Soft tissues and spinal canal: No prevertebral fluid or swelling. No visible canal hematoma. Disc levels: Similar appearance of multilevel disc degeneration including up to severe disc space narrowing from C3-4 to C6-7. Moderate multilevel neural foraminal stenosis due to uncovertebral spurring. Upper chest: Asymmetric right apical pleuroparenchymal scarring. Other: Diminutive or absent left thyroid lobe. Small hypodense nodules in the right thyroid lobe measuring up to approximately 1.2 cm, not significantly changed. Moderate calcified atherosclerosis at the carotid bifurcations. IMPRESSION: 1. No evidence of acute intracranial abnormality. 2. No evidence of acute fracture or traumatic subluxation in the cervical spine. Electronically Signed   By: Logan Bores M.D.   On: 09/01/2018 16:28   Ct Chest Wo Contrast  Result Date: 09/01/2018 CLINICAL DATA:  [place], left lower extremity pain EXAM: CT CHEST, ABDOMEN AND PELVIS WITHOUT CONTRAST TECHNIQUE: Multidetector CT imaging of the chest, abdomen and pelvis was performed following the standard protocol without IV contrast. COMPARISON:  06/20/2017 and previous FINDINGS: CT CHEST FINDINGS Cardiovascular: Coronary and Aortic Atherosclerosis (ICD10-170.0). Tortuous descending thoracic aorta. Heart size normal. No pericardial effusion. Mediastinum/Nodes: Large hiatal hernia involving virtually the entire stomach. No hilar or mediastinal adenopathy. No mediastinal hematoma. Lungs/Pleura: No  pneumothorax. Coarse peripheral airspace opacities. Significant improvement in the pleural effusions seen previously with minimal trace left residual. Improved aeration in the lung bases with scattered coarse airspace opacities in the posterior right lower lobe and medial left lower  lobe with atelectasis. Musculoskeletal: No acute fracture. Thoracic dextroscoliosis with anterior vertebral endplate spurring at multiple levels in the mid and lower thoracic spine. Bilateral shoulder DJD. CT ABDOMEN PELVIS FINDINGS Hepatobiliary: No focal liver abnormality is seen. No gallstones, gallbladder wall thickening, or biliary dilatation. Pancreas: Unremarkable. No pancreatic ductal dilatation or surrounding inflammatory changes. Spleen: Normal in size without focal abnormality. Adrenals/Urinary Tract: No evidence of adrenal mass. No hydronephrosis. No evidence of renal mass. Urinary bladder physiologically distended. Stomach/Bowel: Virtually the entire stomach is involved in a large hiatal hernia. Small bowel is nondilated. Normal appendix. Scattered colonic diverticula most numerous in the distal descending and sigmoid segments, without significant adjacent inflammatory/edematous change or abscess. Vascular/Lymphatic: Aortoiliac arterial calcifications without aneurysm. No abdominal or pelvic adenopathy. Reproductive: Uterus and bilateral adnexa are unremarkable. Other: No ascites. Left pelvic phleboliths. No free air. Musculoskeletal: Subcapital left femoral neck fracture with impaction and displacement, resulting varus deformity. No pelvic ring fracture. Mild levoscoliosis in the lumbar spine with multilevel spondylitic change. IMPRESSION: 1. Left subcapital femoral neck fracture with impaction and displacement. 2. Large hiatal hernia involving the entire stomach. 3. Coronary and Aortic Atherosclerosis (ICD10-I70.0). 4. Colonic diverticulosis. Electronically Signed   By: Lucrezia Europe M.D.   On: 09/01/2018 16:23   Ct  Cervical Spine Wo Contrast  Result Date: 09/01/2018 CLINICAL DATA:  Fall. EXAM: CT HEAD WITHOUT CONTRAST CT CERVICAL SPINE WITHOUT CONTRAST TECHNIQUE: Multidetector CT imaging of the head and cervical spine was performed following the standard protocol without intravenous contrast. Multiplanar CT image reconstructions of the cervical spine were also generated. COMPARISON:  06/13/2017 FINDINGS: CT HEAD FINDINGS Brain: No acute infarct, intracranial hemorrhage, mass effect, or extra-axial fluid collection is identified. Cerebral white matter hypodensities are similar to the prior study and nonspecific but compatible with mild chronic small vessel ischemic disease. Mild cerebral atrophy is also unchanged. Vascular: Calcified atherosclerosis at the skull base. No hyperdense vessel. Skull: No fracture or suspicious osseous lesion. Sinuses/Orbits: Clear paranasal sinuses. Small chronic right mastoid effusion. Bilateral cataract extraction. Other: None. CT CERVICAL SPINE FINDINGS Alignment: Unchanged minimal retrolisthesis of C3 on C4 and C5 on C6 and minimal anterolisthesis of C6 on C7 and C7 on T1. Skull base and vertebrae: No acute fracture or suspicious osseous lesion. Mild median C1-2 arthropathy. Soft tissues and spinal canal: No prevertebral fluid or swelling. No visible canal hematoma. Disc levels: Similar appearance of multilevel disc degeneration including up to severe disc space narrowing from C3-4 to C6-7. Moderate multilevel neural foraminal stenosis due to uncovertebral spurring. Upper chest: Asymmetric right apical pleuroparenchymal scarring. Other: Diminutive or absent left thyroid lobe. Small hypodense nodules in the right thyroid lobe measuring up to approximately 1.2 cm, not significantly changed. Moderate calcified atherosclerosis at the carotid bifurcations. IMPRESSION: 1. No evidence of acute intracranial abnormality. 2. No evidence of acute fracture or traumatic subluxation in the cervical spine.  Electronically Signed   By: Logan Bores M.D.   On: 09/01/2018 16:28   Dg Chest Port 1 View  Result Date: 09/02/2018 CLINICAL DATA:  Shortness of breath. EXAM: PORTABLE CHEST 1 VIEW COMPARISON:  CT chest and chest x-ray from yesterday. FINDINGS: Small stable cardiomediastinal silhouette and large hiatal hernia. Atherosclerotic calcification of the aortic arch. Normal pulmonary vascularity. Low lung volumes with bibasilar atelectasis. Unchanged trace left pleural effusion. No consolidation or pneumothorax. No acute osseous abnormality. IMPRESSION: 1. Bibasilar atelectasis.  Unchanged trace left pleural effusion. Electronically Signed   By: Titus Dubin M.D.   On: 09/02/2018 09:18  Dg Chest Port 1 View  Result Date: 09/01/2018 CLINICAL DATA:  Fall. EXAM: PORTABLE CHEST 1 VIEW COMPARISON:  June 20, 2017. FINDINGS: There is a large hiatal hernia again identified. The heart, hila, mediastinum, pleura are unremarkable. There are small pleural effusions, left greater than right. Mild interstitial prominence without overt edema. No new infiltrates. IMPRESSION: 1. Small effusions, left greater than right. 2. Large hiatal hernia. 3. Pulmonary venous congestion without overt edema. Electronically Signed   By: Dorise Bullion III M.D   On: 09/01/2018 15:27   Dg Hip Unilat W Or Wo Pelvis 2-3 Views Left  Result Date: 09/01/2018 CLINICAL DATA:  Status post fall, left hip fracture EXAM: DG HIP (WITH OR WITHOUT PELVIS) 2-3V LEFT COMPARISON:  None. FINDINGS: Severe osteopenia. Displaced left subcapital femoral neck fracture.  No dislocation. No other acute fracture or dislocation. IMPRESSION: 1. Acute displaced left subcapital femoral neck fracture. Electronically Signed   By: Kathreen Devoid   On: 09/01/2018 15:28     Antimicrobials:   Peri-operative per ortho   Subjective: Patient remains confused with advanced dementia.  Remains bedridden Unable to interact secondary to advanced cognitive  decline  Objective: Vitals:   09/02/18 0427 09/02/18 1254 09/03/18 0450 09/03/18 1345  BP: 114/65 92/64 102/73 111/78  Pulse: (!) 113 89 95 (!) 102  Resp: 18 20 16 15   Temp: 98.4 F (36.9 C) 98.2 F (36.8 C) 99.4 F (37.4 C) 99.6 F (37.6 C)  TempSrc: Oral Oral Oral Oral  SpO2: 92% 90%    Weight:      Height:        Intake/Output Summary (Last 24 hours) at 09/03/2018 1408 Last data filed at 09/03/2018 0827 Gross per 24 hour  Intake 0 ml  Output 250 ml  Net -250 ml   Filed Weights   09/01/18 1431  Weight: 72.6 kg    Examination:  General exam: calm, advanced cog decline Respiratory system: Clear to auscultation. Respiratory effort normal. Cardiovascular system: S1 & S2 heard, RRR. No JVD, murmurs, rubs, gallops or clicks. No pedal edema. Gastrointestinal system: Abdomen is nondistended, soft and nontender. No organomegaly or masses felt. Normal bowel sounds heard. Central nervous system: Alert and oriented. No focal neurological deficits. Extremities: Externally rotated, neurovascularly intact. Skin: No rashes, lesions or ulcers Psychiatry: Advanced cognitive decline, unable to meaningful interact    Data Reviewed: I have personally reviewed following labs and imaging studies  CBC: Recent Labs  Lab 09/01/18 1501 09/02/18 0234  WBC 2.9* 7.7  NEUTROABS 1.6*  --   HGB 13.2 12.5  HCT 40.1 37.3  MCV 102.0* 101.4*  PLT 140* 097*   Basic Metabolic Panel: Recent Labs  Lab 09/01/18 1501 09/02/18 0234  NA 139 140  K 5.0 4.2  CL 97* 99  CO2 28 28  GLUCOSE 93 186*  BUN 40* 42*  CREATININE 1.45* 1.31*  CALCIUM 9.6 9.1   GFR: Estimated Creatinine Clearance: 25.1 mL/min (A) (by C-G formula based on SCr of 1.31 mg/dL (H)). Liver Function Tests: Recent Labs  Lab 09/01/18 1501  AST 20  ALT 19  ALKPHOS 80  BILITOT 0.5  PROT 6.8  ALBUMIN 4.1   No results for input(s): LIPASE, AMYLASE in the last 168 hours. No results for input(s): AMMONIA in the last 168  hours. Coagulation Profile: Recent Labs  Lab 09/01/18 1501 09/02/18 0234  INR 0.9 1.1   Cardiac Enzymes: No results for input(s): CKTOTAL, CKMB, CKMBINDEX, TROPONINI in the last 168 hours. BNP (last 3  results) No results for input(s): PROBNP in the last 8760 hours. HbA1C: Recent Labs    09/01/18 1501  HGBA1C 5.9*   CBG: No results for input(s): GLUCAP in the last 168 hours. Lipid Profile: No results for input(s): CHOL, HDL, LDLCALC, TRIG, CHOLHDL, LDLDIRECT in the last 72 hours. Thyroid Function Tests: No results for input(s): TSH, T4TOTAL, FREET4, T3FREE, THYROIDAB in the last 72 hours. Anemia Panel: No results for input(s): VITAMINB12, FOLATE, FERRITIN, TIBC, IRON, RETICCTPCT in the last 72 hours. Sepsis Labs: No results for input(s): PROCALCITON, LATICACIDVEN in the last 168 hours.  Recent Results (from the past 240 hour(s))  SARS Coronavirus 2 (CEPHEID - Performed in [place] hospital lab), Hosp Order     Status: None   Collection Time: 09/01/18  3:44 PM  Result Value Ref Range Status   SARS Coronavirus 2 NEGATIVE NEGATIVE Final    Comment: (NOTE) If result is NEGATIVE SARS-CoV-2 target nucleic acids are NOT DETECTED. The SARS-CoV-2 RNA is generally detectable in upper and lower  respiratory specimens during the acute phase of infection. The lowest  concentration of SARS-CoV-2 viral copies this assay can detect is 250  copies / mL. A negative result does not preclude SARS-CoV-2 infection  and should not be used as the sole basis for treatment or other  patient management decisions.  A negative result may occur with  improper specimen collection / handling, submission of specimen other  than nasopharyngeal swab, presence of viral mutation(s) within the  areas targeted by this assay, and inadequate number of viral copies  (<250 copies / mL). A negative result must be combined with clinical  observations, patient history, and epidemiological information. If result  is POSITIVE SARS-CoV-2 target nucleic acids are DETECTED. The SARS-CoV-2 RNA is generally detectable in upper and lower  respiratory specimens dur ing the acute phase of infection.  Positive  results are indicative of active infection with SARS-CoV-2.  Clinical  correlation with patient history and other diagnostic information is  necessary to determine patient infection status.  Positive results do  not rule out bacterial infection or co-infection with other viruses. If result is PRESUMPTIVE POSTIVE SARS-CoV-2 nucleic acids MAY BE PRESENT.   A presumptive positive result was obtained on the submitted specimen  and confirmed on repeat testing.  While 2019 novel coronavirus  (SARS-CoV-2) nucleic acids may be present in the submitted sample  additional confirmatory testing may be necessary for epidemiological  and / or clinical management purposes  to differentiate between  SARS-CoV-2 and other [place]ovirus currently known to infect humans.  If clinically indicated additional testing with an alternate test  methodology [phone]) is advised. The SARS-CoV-2 RNA is generally  detectable in upper and lower respiratory sp ecimens during the acute  phase of infection. The expected result is Negative. Fact Sheet for Patients:  StrictlyIdeas.no Fact Sheet for Healthcare Providers: BankingDealers.co.za This test is not yet approved or cleared by the [place] FDA and has been authorized for detection and/or diagnosis of SARS-CoV-2 by FDA under an Emergency Use Authorization (EUA).  This EUA will remain in effect (meaning this test can be used) for the duration of the COVID-19 declaration under Section 564(b)(1) of the Act, 21 U.S.C. section 360bbb-3(b)(1), unless the authorization is terminated or revoked sooner. Performed at [facility], [place] [address] [place], [place] [address]   Surgical pcr screen     Status: Abnormal    Collection Time: 09/01/18  8:30 PM  Result Value Ref Range Status  MRSA, PCR POSITIVE (A) NEGATIVE Final    Comment: RESULT CALLED TO, READ BACK BY AND VERIFIED WITH: HANDY,C RN 09/01/2018 AT 2203 SKEEN,P    Staphylococcus aureus POSITIVE (A) NEGATIVE Final    Comment: (NOTE) The Xpert SA Assay (FDA approved for NASAL specimens in patients 109 years of age and older), is one component of a comprehensive surveillance program. It is not intended to diagnose infection nor to guide or monitor treatment. Performed at [facility], [place] [address], [place], [place] [address]          Radiology Studies: Ct Abdomen Pelvis Wo Contrast  Result Date: 09/01/2018 CLINICAL DATA:  [place], left lower extremity pain EXAM: CT CHEST, ABDOMEN AND PELVIS WITHOUT CONTRAST TECHNIQUE: Multidetector CT imaging of the chest, abdomen and pelvis was performed following the standard protocol without IV contrast. COMPARISON:  06/20/2017 and previous FINDINGS: CT CHEST FINDINGS Cardiovascular: Coronary and Aortic Atherosclerosis (ICD10-170.0). Tortuous descending thoracic aorta. Heart size normal. No pericardial effusion. Mediastinum/Nodes: Large hiatal hernia involving virtually the entire stomach. No hilar or mediastinal adenopathy. No mediastinal hematoma. Lungs/Pleura: No pneumothorax. Coarse peripheral airspace opacities. Significant improvement in the pleural effusions seen previously with minimal trace left residual. Improved aeration in the lung bases with scattered coarse airspace opacities in the posterior right lower lobe and medial left lower lobe with atelectasis. Musculoskeletal: No acute fracture. Thoracic dextroscoliosis with anterior vertebral endplate spurring at multiple levels in the mid and lower thoracic spine. Bilateral shoulder DJD. CT ABDOMEN PELVIS FINDINGS Hepatobiliary: No focal liver abnormality is seen. No gallstones, gallbladder wall thickening, or biliary dilatation. Pancreas:  Unremarkable. No pancreatic ductal dilatation or surrounding inflammatory changes. Spleen: Normal in size without focal abnormality. Adrenals/Urinary Tract: No evidence of adrenal mass. No hydronephrosis. No evidence of renal mass. Urinary bladder physiologically distended. Stomach/Bowel: Virtually the entire stomach is involved in a large hiatal hernia. Small bowel is nondilated. Normal appendix. Scattered colonic diverticula most numerous in the distal descending and sigmoid segments, without significant adjacent inflammatory/edematous change or abscess. Vascular/Lymphatic: Aortoiliac arterial calcifications without aneurysm. No abdominal or pelvic adenopathy. Reproductive: Uterus and bilateral adnexa are unremarkable. Other: No ascites. Left pelvic phleboliths. No free air. Musculoskeletal: Subcapital left femoral neck fracture with impaction and displacement, resulting varus deformity. No pelvic ring fracture. Mild levoscoliosis in the lumbar spine with multilevel spondylitic change. IMPRESSION: 1. Left subcapital femoral neck fracture with impaction and displacement. 2. Large hiatal hernia involving the entire stomach. 3. Coronary and Aortic Atherosclerosis (ICD10-I70.0). 4. Colonic diverticulosis. Electronically Signed   By: Lucrezia Europe M.D.   On: 09/01/2018 16:23   Ct Head Wo Contrast  Result Date: 09/01/2018 CLINICAL DATA:  Fall. EXAM: CT HEAD WITHOUT CONTRAST CT CERVICAL SPINE WITHOUT CONTRAST TECHNIQUE: Multidetector CT imaging of the head and cervical spine was performed following the standard protocol without intravenous contrast. Multiplanar CT image reconstructions of the cervical spine were also generated. COMPARISON:  06/13/2017 FINDINGS: CT HEAD FINDINGS Brain: No acute infarct, intracranial hemorrhage, mass effect, or extra-axial fluid collection is identified. Cerebral white matter hypodensities are similar to the prior study and nonspecific but compatible with mild chronic small vessel ischemic  disease. Mild cerebral atrophy is also unchanged. Vascular: Calcified atherosclerosis at the skull base. No hyperdense vessel. Skull: No fracture or suspicious osseous lesion. Sinuses/Orbits: Clear paranasal sinuses. Small chronic right mastoid effusion. Bilateral cataract extraction. Other: None. CT CERVICAL SPINE FINDINGS Alignment: Unchanged minimal retrolisthesis of C3 on C4 and C5 on C6 and minimal anterolisthesis of C6 on C7  and C7 on T1. Skull base and vertebrae: No acute fracture or suspicious osseous lesion. Mild median C1-2 arthropathy. Soft tissues and spinal canal: No prevertebral fluid or swelling. No visible canal hematoma. Disc levels: Similar appearance of multilevel disc degeneration including up to severe disc space narrowing from C3-4 to C6-7. Moderate multilevel neural foraminal stenosis due to uncovertebral spurring. Upper chest: Asymmetric right apical pleuroparenchymal scarring. Other: Diminutive or absent left thyroid lobe. Small hypodense nodules in the right thyroid lobe measuring up to approximately 1.2 cm, not significantly changed. Moderate calcified atherosclerosis at the carotid bifurcations. IMPRESSION: 1. No evidence of acute intracranial abnormality. 2. No evidence of acute fracture or traumatic subluxation in the cervical spine. Electronically Signed   By: Logan Bores M.D.   On: 09/01/2018 16:28   Ct Chest Wo Contrast  Result Date: 09/01/2018 CLINICAL DATA:  [place], left lower extremity pain EXAM: CT CHEST, ABDOMEN AND PELVIS WITHOUT CONTRAST TECHNIQUE: Multidetector CT imaging of the chest, abdomen and pelvis was performed following the standard protocol without IV contrast. COMPARISON:  06/20/2017 and previous FINDINGS: CT CHEST FINDINGS Cardiovascular: Coronary and Aortic Atherosclerosis (ICD10-170.0). Tortuous descending thoracic aorta. Heart size normal. No pericardial effusion. Mediastinum/Nodes: Large hiatal hernia involving virtually the entire stomach. No hilar or  mediastinal adenopathy. No mediastinal hematoma. Lungs/Pleura: No pneumothorax. Coarse peripheral airspace opacities. Significant improvement in the pleural effusions seen previously with minimal trace left residual. Improved aeration in the lung bases with scattered coarse airspace opacities in the posterior right lower lobe and medial left lower lobe with atelectasis. Musculoskeletal: No acute fracture. Thoracic dextroscoliosis with anterior vertebral endplate spurring at multiple levels in the mid and lower thoracic spine. Bilateral shoulder DJD. CT ABDOMEN PELVIS FINDINGS Hepatobiliary: No focal liver abnormality is seen. No gallstones, gallbladder wall thickening, or biliary dilatation. Pancreas: Unremarkable. No pancreatic ductal dilatation or surrounding inflammatory changes. Spleen: Normal in size without focal abnormality. Adrenals/Urinary Tract: No evidence of adrenal mass. No hydronephrosis. No evidence of renal mass. Urinary bladder physiologically distended. Stomach/Bowel: Virtually the entire stomach is involved in a large hiatal hernia. Small bowel is nondilated. Normal appendix. Scattered colonic diverticula most numerous in the distal descending and sigmoid segments, without significant adjacent inflammatory/edematous change or abscess. Vascular/Lymphatic: Aortoiliac arterial calcifications without aneurysm. No abdominal or pelvic adenopathy. Reproductive: Uterus and bilateral adnexa are unremarkable. Other: No ascites. Left pelvic phleboliths. No free air. Musculoskeletal: Subcapital left femoral neck fracture with impaction and displacement, resulting varus deformity. No pelvic ring fracture. Mild levoscoliosis in the lumbar spine with multilevel spondylitic change. IMPRESSION: 1. Left subcapital femoral neck fracture with impaction and displacement. 2. Large hiatal hernia involving the entire stomach. 3. Coronary and Aortic Atherosclerosis (ICD10-I70.0). 4. Colonic diverticulosis. Electronically  Signed   By: Lucrezia Europe M.D.   On: 09/01/2018 16:23   Ct Cervical Spine Wo Contrast  Result Date: 09/01/2018 CLINICAL DATA:  Fall. EXAM: CT HEAD WITHOUT CONTRAST CT CERVICAL SPINE WITHOUT CONTRAST TECHNIQUE: Multidetector CT imaging of the head and cervical spine was performed following the standard protocol without intravenous contrast. Multiplanar CT image reconstructions of the cervical spine were also generated. COMPARISON:  06/13/2017 FINDINGS: CT HEAD FINDINGS Brain: No acute infarct, intracranial hemorrhage, mass effect, or extra-axial fluid collection is identified. Cerebral white matter hypodensities are similar to the prior study and nonspecific but compatible with mild chronic small vessel ischemic disease. Mild cerebral atrophy is also unchanged. Vascular: Calcified atherosclerosis at the skull base. No hyperdense vessel. Skull: No fracture or suspicious osseous lesion. Sinuses/Orbits: Clear  paranasal sinuses. Small chronic right mastoid effusion. Bilateral cataract extraction. Other: None. CT CERVICAL SPINE FINDINGS Alignment: Unchanged minimal retrolisthesis of C3 on C4 and C5 on C6 and minimal anterolisthesis of C6 on C7 and C7 on T1. Skull base and vertebrae: No acute fracture or suspicious osseous lesion. Mild median C1-2 arthropathy. Soft tissues and spinal canal: No prevertebral fluid or swelling. No visible canal hematoma. Disc levels: Similar appearance of multilevel disc degeneration including up to severe disc space narrowing from C3-4 to C6-7. Moderate multilevel neural foraminal stenosis due to uncovertebral spurring. Upper chest: Asymmetric right apical pleuroparenchymal scarring. Other: Diminutive or absent left thyroid lobe. Small hypodense nodules in the right thyroid lobe measuring up to approximately 1.2 cm, not significantly changed. Moderate calcified atherosclerosis at the carotid bifurcations. IMPRESSION: 1. No evidence of acute intracranial abnormality. 2. No evidence of acute  fracture or traumatic subluxation in the cervical spine. Electronically Signed   By: Logan Bores M.D.   On: 09/01/2018 16:28   Dg Chest Port 1 View  Result Date: 09/02/2018 CLINICAL DATA:  Shortness of breath. EXAM: PORTABLE CHEST 1 VIEW COMPARISON:  CT chest and chest x-ray from yesterday. FINDINGS: Small stable cardiomediastinal silhouette and large hiatal hernia. Atherosclerotic calcification of the aortic arch. Normal pulmonary vascularity. Low lung volumes with bibasilar atelectasis. Unchanged trace left pleural effusion. No consolidation or pneumothorax. No acute osseous abnormality. IMPRESSION: 1. Bibasilar atelectasis.  Unchanged trace left pleural effusion. Electronically Signed   By: Titus Dubin M.D.   On: 09/02/2018 09:18   Dg Chest Port 1 View  Result Date: 09/01/2018 CLINICAL DATA:  Fall. EXAM: PORTABLE CHEST 1 VIEW COMPARISON:  June 20, 2017. FINDINGS: There is a large hiatal hernia again identified. The heart, hila, mediastinum, pleura are unremarkable. There are small pleural effusions, left greater than right. Mild interstitial prominence without overt edema. No new infiltrates. IMPRESSION: 1. Small effusions, left greater than right. 2. Large hiatal hernia. 3. Pulmonary venous congestion without overt edema. Electronically Signed   By: Dorise Bullion III M.D   On: 09/01/2018 15:27   Dg Hip Unilat W Or Wo Pelvis 2-3 Views Left  Result Date: 09/01/2018 CLINICAL DATA:  Status post fall, left hip fracture EXAM: DG HIP (WITH OR WITHOUT PELVIS) 2-3V LEFT COMPARISON:  None. FINDINGS: Severe osteopenia. Displaced left subcapital femoral neck fracture.  No dislocation. No other acute fracture or dislocation. IMPRESSION: 1. Acute displaced left subcapital femoral neck fracture. Electronically Signed   By: Kathreen Devoid   On: 09/01/2018 15:28        Scheduled Meds:  acetaminophen (TYLENOL) oral liquid 160 mg/5 mL  650 mg Oral Q8H   artificial tears  1 application Both Eyes BID    chlorhexidine  60 mL Topical Once   Chlorhexidine Gluconate Cloth  6 each Topical Q0600   divalproex  125 mg Oral TID   escitalopram  10 mg Oral Daily   ketotifen  1 drop Both Eyes BID   levothyroxine  100 mcg Oral QAC breakfast   Melatonin  7.5 mg Oral QHS   mupirocin ointment  1 application Nasal BID   OLANZapine zydis  2.5 mg Oral q morning - 10a   OLANZapine zydis  7.5 mg Oral QHS   povidone-iodine  2 application Topical Once   senna-docusate  2 tablet Oral QHS   Continuous Infusions:  dextrose 5 % and 0.45% NaCl Stopped (09/01/18 1938)     LOS: 2 days    Time spent: 25 min  Nicolette Bang, MD [facility]  If 7PM-7AM, please contact night-coverage  09/03/2018, 2:08 PM

## 2018-09-03 NOTE — Consult Note (Signed)
Consultation Note Date: 09/03/2018   Patient Name: Cristina Middleton  DOB: [birth year]  MRN: [id number]  Age / Sex: 85 y.o., female  PCP: Jani Gravel, MD Referring Physician: Marcell Anger*  Reason for Consultation: Establishing goals of care, Pain control and Psychosocial/spiritual support  HPI/Patient Profile: 85 y.o. female  with past medical history of advanced dementia, large hiatal hernia, OSA, macular degeneration, DM2 and hearing loss who was admitted on 09/01/2018 after a fall with left hip fracture, AKI, and hypoxic respiratory failure.  She has been seen by orthopedic surgery and anesthesia.  Surgery has been offered and the family understands it is high risk.  Family requested a Palliative consult to assist with figuring out next steps.   Clinical Assessment and Goals of Care:  I have reviewed medical records including EPIC notes, labs and imaging, received report from Dr. Mardelle Matte, assessed the patient and then spoke on the phone with her daughter Levander Campion), son, and daughter in law to discuss diagnosis, prognosis, GOC, disposition and options.  I introduced Palliative Medicine as specialized medical care for people living with serious illness. It focuses on providing relief from the symptoms and stress of a serious illness. The goal is to improve quality of life for both the patient and the family.  We discussed a brief life review of the patient. Mrs. 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  She was married to her husband for 51 years until he died of cancer.  Her daughter Levander Campion speaks fondly of their courtship that took place during WWII.  Mrs. Kernes was raised a [employer] but became a [employer] in order for her children to attend preschool at [employer].  She has never had many friends or hobbies. The  greatest joys of her life are her 4 great grand children - one of whom Terrence Dupont) she delivered when she was 85 years old!  As far as functional and nutritional status Mrs. Mullinix has been eating well - unassisted at [facility].  She has lived there for the past year and ambulates with a walker.  She has had multiple falls over the past year.  However, her family feels her quality of life at [name] has been good - she has been happy.   Levander Campion speaks of difficult battles with sundowning and extended hospital delirium in the past.  She is relieved that the medications [name] has Mrs. Rothert on have been serving her well.  We discussed her current illness and what it means in the larger context of her on-going co-morbidities.  Natural disease trajectory and expectations at EOL were discussed.  I attempted to elicit values and goals of care important to the patient.  The family feels strongly that Mrs. Snowdon would not want to live a life in which she could not ambulate "She's a goer and a doer!".  We discussed the probability of significant post op complications including not coming off the ventilator, extended hospital delirium, not being  able to eat, significant worsening of dementia and even the possibility of dying several weeks after the surgery if she is not able to recover.   The family had not considered these issues and wanted to talk again on video tomorrow afternoon at 4:30 pm.   Primary Decision Maker:  NEXT OF KIN  Daughter Ky Barban    SUMMARY OF RECOMMENDATIONS    Follow up Palliative Video Meeting on 5/5 at 4:30 with extended family.  Added scheduled tylenol and very low dose oxy ir PRN for pain.  Ensure patient receives (and takes) her Olanzepine - this is likely the medication keeping her stable.  I'll re-order a diet for now adding feeding assistance and aspiration precautions.  Chaplain consultation  Will add Senna as patient has heavy stool burden on imaging.   Code  Status/Advance Care Planning:  DNR   Symptom Management:   As above  Additional Recommendations (Limitations, Scope, Preferences):  Full Scope Treatment  Palliative Prophylaxis:   Aspiration, Bowel Regimen and Frequent Pain Assessment  Psycho-social/Spiritual:   Desire for further Chaplaincy support: requested  Prognosis:   Difficult to determine at this point.  Certainly less than 1 year given multiple falls, advanced dementia, and advanced age. Will be able to provide a prognosis as her hospitalization progresses.  Discharge Planning: To Be Determined      Primary Diagnoses: Present on Admission: . Closed left hip fracture ([place]) . Fall at nursing home . HTN (hypertension) . Osteopenia . Sleep apnea . Hypothyroidism . Hypoxemia . AKI (acute kidney injury) ([place]) . Leukopenia . Thrombocytopenia ([place])   I have reviewed the medical record, interviewed the patient and family, and examined the patient. The following aspects are pertinent.  Past Medical History:  Diagnosis Date  . Anxiety   . Arthritis   . Diabetes mellitus without complication ([place])   . Dislocated shoulder    WAS POPPED BACK IN PLACE  2006???  . GERD (gastroesophageal reflux disease)    TAKES  ACIPHEX  . Hyperlipidemia   . Hypertension   . Hyperthyroidism    NOT SURE WHICH ONE  . PONV (postoperative nausea and vomiting)    YRS AGO.....(6-7 YRS)  . Sleep apnea    WEARS MASK EVERY NOW AND THEN   Social History   Socioeconomic History  . Marital status: Widowed    Spouse name: Not on file  . Number of children: Not on file  . Years of education: Not on file  . Highest education level: Not on file  Occupational History  . Not on file  Social Needs  . Financial resource strain: Not on file  . Food insecurity:    Worry: Not on file    Inability: Not on file  . Transportation needs:    Medical: Not on file    Non-medical: Not on file  Tobacco Use  . Smoking status: Former Smoker     Packs/day: 1.00    Types: Cigarettes    Last attempt to quit: 05/02/1998    Years since quitting: 20.3  . Smokeless tobacco: Never Used  Substance and Sexual Activity  . Alcohol use: No  . Drug use: No  . Sexual activity: Never  Lifestyle  . Physical activity:    Days per week: Not on file    Minutes per session: Not on file  . Stress: Not on file  Relationships  . Social connections:    Talks on phone: Not on file    Gets together: Not on  file    Attends religious service: Not on file    Active member of club or organization: Not on file    Attends meetings of clubs or organizations: Not on file    Relationship status: Not on file  Other Topics Concern  . Not on file  Social History Narrative  . Not on file   Family History  Problem Relation Age of Onset  . Other Mother   . Other Father   . Breast cancer Sister   . Colon cancer Sister   . CAD Sister   . Marfan syndrome Sister   . CAD Brother   . Marfan syndrome Brother    Scheduled Meds: . acetaminophen (TYLENOL) oral liquid 160 mg/5 mL  650 mg Oral Q8H  . artificial tears  1 application Both Eyes BID  . chlorhexidine  60 mL Topical Once  . Chlorhexidine Gluconate Cloth  6 each Topical Q0600  . divalproex  125 mg Oral TID  . escitalopram  10 mg Oral Daily  . ketotifen  1 drop Both Eyes BID  . levothyroxine  100 mcg Oral QAC breakfast  . Melatonin  7.5 mg Oral QHS  . mupirocin ointment  1 application Nasal BID  . OLANZapine  2.5 mg Oral q morning - 10a  . OLANZapine  7.5 mg Oral QHS  . povidone-iodine  2 application Topical Once   Continuous Infusions: . dextrose 5 % and 0.45% NaCl Stopped (09/01/18 1938)   PRN Meds:.acetaminophen **OR** acetaminophen, albuterol, LORazepam, ondansetron **OR** ondansetron (ZOFRAN) IV, oxyCODONE, polyethylene glycol, polyvinyl alcohol, traZODone Allergies  Allergen Reactions  . Penicillins Hives, Itching, Swelling and Other (See Comments)    Reaction:  Unspecified swelling  reaction Has patient had a PCN reaction causing immediate rash, facial/tongue/throat swelling, SOB or lightheadedness with hypotension: Yes Has patient had a PCN reaction causing severe rash involving mucus membranes or skin necrosis: No Has patient had a PCN reaction that required hospitalization No Has patient had a PCN reaction occurring within the last 10 years: No If all of the above answers are "NO", then may proceed with Cephalosporin use.  . Sulfa Antibiotics Hives, Itching, Swelling and Other (See Comments)    Reaction:  Unspecified swelling reaction  . Alka-Seltzer [Aspirin Effervescent] Other (See Comments)    Hallucinations   Review of Systems pleasantly demented.  Physical Exam  Well developed elderly female who looks younger than stated age.  Pleasantly demented.  Interactive.  HOH CV rrr resp no distress on 4.5 L  N/C Abdomen soft, nt, nd   Vital Signs: BP 102/73 (BP Location: Left Wrist)   Pulse 95   Temp 99.4 F (37.4 C) (Oral)   Resp 16   Ht 5\' 4"  (1.626 m)   Wt 72.6 kg   SpO2 90%   BMI 27.46 kg/m  Pain Scale: 0-10 POSS *See Group Information*: 1-Acceptable,Awake and alert Pain Score: Asleep   SpO2: SpO2: 90 % O2 Device:SpO2: 90 % O2 Flow Rate: .O2 Flow Rate (L/min): 4 L/min  IO: Intake/output summary:   Intake/Output Summary (Last 24 hours) at 09/03/2018 1032 Last data filed at 09/03/2018 0827 Gross per 24 hour  Intake 120 ml  Output 550 ml  Net -430 ml    LBM:   Baseline Weight: Weight: 72.6 kg Most recent weight: Weight: 72.6 kg     Palliative Assessment/Data: 30%     Time In: 9:00 Time Out: 11:00 Time Total: 120 min. Greater than 50%  of this time was spent counseling  and coordinating care related to the above assessment and plan.  Signed by: Florentina Jenny, PA-C Palliative Medicine Pager: [phone]  Please contact Palliative Medicine Team phone at [phone] for questions and concerns.  For individual provider: See Shea Evans

## 2018-09-03 NOTE — Plan of Care (Signed)
Problem: Education: Goal: Knowledge of General Education information will improve Description Including pain rating scale, medication(s)/side effects and non-pharmacologic comfort measures Outcome: Progressing   Problem: Health Behavior/Discharge Planning: Goal: Ability to manage health-related needs will improve Outcome: Progressing   Problem: Clinical Measurements: Goal: Respiratory complications will improve Outcome: Progressing Goal: Cardiovascular complication will be avoided Outcome: Progressing   Problem: Coping: Goal: Level of anxiety will decrease Outcome: Progressing   Problem: Elimination: Goal: Will not experience complications related to urinary retention Outcome: Progressing   Problem: Pain Managment: Goal: General experience of comfort will improve Outcome: Progressing   Problem: Safety: Goal: Ability to remain free from injury will improve Outcome: Progressing   Problem: Skin Integrity: Goal: Risk for impaired skin integrity will decrease Outcome: Progressing   

## 2018-09-04 DIAGNOSIS — Z515 Encounter for palliative care: Secondary | ICD-10-CM

## 2018-09-04 DIAGNOSIS — R410 Disorientation, unspecified: Secondary | ICD-10-CM

## 2018-09-04 MED ORDER — GLYCOPYRROLATE 0.2 MG/ML IJ SOLN: 0.2000 mg | INTRAMUSCULAR | Status: DC | PRN

## 2018-09-04 MED ORDER — HYDROMORPHONE HCL 1 MG/ML IJ SOLN: 0.2500 mg | INTRAMUSCULAR | Status: DC | PRN

## 2018-09-04 MED ORDER — OLANZAPINE 5 MG PO TBDP
5.0000 mg | ORAL_TABLET | Freq: Every morning | ORAL | Status: DC
  Filled 2018-09-04: qty 1

## 2018-09-04 MED ORDER — HALOPERIDOL 0.5 MG PO TABS
0.5000 mg | ORAL_TABLET | ORAL | Status: DC | PRN
  Filled 2018-09-04: qty 1

## 2018-09-04 MED ORDER — GLYCOPYRROLATE 1 MG PO TABS
1.0000 mg | ORAL_TABLET | ORAL | Status: DC | PRN
  Filled 2018-09-04: qty 1

## 2018-09-04 MED ORDER — OLANZAPINE 5 MG PO TBDP
10.0000 mg | ORAL_TABLET | Freq: Every day | ORAL | Status: DC
  Administered 2018-09-04: 10 mg via ORAL
  Filled 2018-09-04 (×2): qty 2

## 2018-09-04 MED ORDER — BIOTENE DRY MOUTH MT LIQD: 15.0000 mL | OROMUCOSAL | Status: DC | PRN

## 2018-09-04 MED ORDER — HALOPERIDOL LACTATE 5 MG/ML IJ SOLN: 0.5000 mg | INTRAMUSCULAR | Status: DC | PRN

## 2018-09-04 MED ORDER — HALOPERIDOL LACTATE 2 MG/ML PO CONC
0.5000 mg | ORAL | Status: DC | PRN
  Filled 2018-09-04: qty 0.3

## 2018-09-04 MED ORDER — POLYVINYL ALCOHOL 1.4 % OP SOLN
1.0000 [drp] | Freq: Four times a day (QID) | OPHTHALMIC | Status: DC | PRN
  Filled 2018-09-04: qty 15

## 2018-09-04 MED ORDER — BISACODYL 10 MG RE SUPP: 10.0000 mg | Freq: Every day | RECTAL | Status: DC

## 2018-09-04 NOTE — Progress Notes (Signed)
This chaplain responded to Pt. Spiritual care consult from [facility].  The Pt. was sleeping and did not respond to her name at time of visit.  This chaplain will F/U with a spiritual care visit.

## 2018-09-04 NOTE — Progress Notes (Signed)
PROGRESS NOTE    Cristina Middleton [facility]  SWF:[id number] DOB: [birth year] DOA: 09/01/2018 PCP: Jani Gravel, MD   Brief Narrative:  Briefly this is a 85 year old female with advanced dementia, diabetes type 2, hypertension, obstructive sleep apnea not on CPAP, hypothyroid and anxiety who presented from [facility] memory unit with fall.  Patient was found to have hip fracture.  Several conversations occurred with family with plan for 30 meeting today to determine most appropriate intervention.  The primary goal of that meeting is to determine whether patient will have surgery secondary to her advanced dementia and her likely inability to follow any postoperative rehab or precautions.   Assessment & Plan:   Principal Problem:   Closed left hip fracture (HCC) Active Problems:   HTN (hypertension)   Diabetes mellitus, type 2 (HCC)   Sleep apnea   Hiatal hernia with GERD   Hypothyroidism   Fall at nursing home   Osteopenia   Hypoxemia   AKI (acute kidney injury) ([place])   Leukopenia   Thrombocytopenia (HCC)   Pressure injury of skin   Dementia associated with other underlying disease without behavioral disturbance ([place])   Palliative care encounter   Left subcapital femoral neck fracture with impaction and displacement: Patient was seen by orthopedics with family meeting to determine medical management versus operative management. Continue with pain management, Continue SCDs.  Acute hypoxic respiratory failure. Patient had a rapid response with low sats.  Ardyth Gal was obtained which showed no edema no evidence of CHF no infiltrates.  Patient rapidly recovered. This was likely a complication of obstructive sleep apnea not on CPAP, pain medications, and a large hiatal hernia noted on CT with stomach essentially in her chest cavity  Acute kidney injury  Continue with gentle fluids follow creatinine no acute intervention  Advanced dementia.  Patient is unable to follow any commands. Supportive  care Family meeting today with palliative care  Thrombocytopenia: No bleeding continue to monitor  Large hiatal hernia This is noted on chest x-ray and CT of chest. Keep head of bed elevated 30 degrees  Diabetes type 2 not on medications Hemoglobin A1c of 5.3 in February 2019 and 5.9    DVT prophylaxis: SCD/Compression stockings  Code Status: DNR    Code Status Orders  (From admission, onward)         Start     Ordered   09/01/18 1739  Do not attempt resuscitation (DNR)  Continuous    Question Answer Comment  In the event of cardiac or respiratory ARREST Do not call a code blue   In the event of cardiac or respiratory ARREST Do not perform Intubation, CPR, defibrillation or ACLS   In the event of cardiac or respiratory ARREST Use medication by any route, position, wound care, and other measures to relive pain and suffering. May use oxygen, suction and manual treatment of airway obstruction as needed for comfort.      09/01/18 1743        Code Status History    Date Active Date Inactive Code Status Order ID Comments User Context   06/17/2017 1754 06/22/2017 1954 DNR [id number]  Bonnell Public, MD Inpatient   06/14/2017 0339 06/17/2017 1754 Full Code [id number]  Vianne Bulls, MD ED   12/09/2016 0153 12/10/2016 1631 Full Code [id number]  Reubin Milan, MD Inpatient   06/14/2016 1743 06/16/2016 1806 Full Code [id number]  Janece Canterbury, MD Inpatient   06/13/2016 2023 06/14/2016 1743 DNR [id number]  Janece Canterbury, MD Inpatient  06/28/2014 0235 07/02/2014 1505 DNR [id number]  Ivor Costa, MD Inpatient     Family Communication: Family meeting with family and palliative care scheduled for 71 today Disposition Plan:   Disposition pending above Consults called: None Admission status: Inpatient   Consultants:   ORTHO, PALLIATIVE CARE  Procedures:  Ct Abdomen Pelvis Wo Contrast  Result Date: 09/01/2018 CLINICAL DATA:  [place], left lower extremity pain EXAM: CT CHEST,  ABDOMEN AND PELVIS WITHOUT CONTRAST TECHNIQUE: Multidetector CT imaging of the chest, abdomen and pelvis was performed following the standard protocol without IV contrast. COMPARISON:  06/20/2017 and previous FINDINGS: CT CHEST FINDINGS Cardiovascular: Coronary and Aortic Atherosclerosis (ICD10-170.0). Tortuous descending thoracic aorta. Heart size normal. No pericardial effusion. Mediastinum/Nodes: Large hiatal hernia involving virtually the entire stomach. No hilar or mediastinal adenopathy. No mediastinal hematoma. Lungs/Pleura: No pneumothorax. Coarse peripheral airspace opacities. Significant improvement in the pleural effusions seen previously with minimal trace left residual. Improved aeration in the lung bases with scattered coarse airspace opacities in the posterior right lower lobe and medial left lower lobe with atelectasis. Musculoskeletal: No acute fracture. Thoracic dextroscoliosis with anterior vertebral endplate spurring at multiple levels in the mid and lower thoracic spine. Bilateral shoulder DJD. CT ABDOMEN PELVIS FINDINGS Hepatobiliary: No focal liver abnormality is seen. No gallstones, gallbladder wall thickening, or biliary dilatation. Pancreas: Unremarkable. No pancreatic ductal dilatation or surrounding inflammatory changes. Spleen: Normal in size without focal abnormality. Adrenals/Urinary Tract: No evidence of adrenal mass. No hydronephrosis. No evidence of renal mass. Urinary bladder physiologically distended. Stomach/Bowel: Virtually the entire stomach is involved in a large hiatal hernia. Small bowel is nondilated. Normal appendix. Scattered colonic diverticula most numerous in the distal descending and sigmoid segments, without significant adjacent inflammatory/edematous change or abscess. Vascular/Lymphatic: Aortoiliac arterial calcifications without aneurysm. No abdominal or pelvic adenopathy. Reproductive: Uterus and bilateral adnexa are unremarkable. Other: No ascites. Left pelvic  phleboliths. No free air. Musculoskeletal: Subcapital left femoral neck fracture with impaction and displacement, resulting varus deformity. No pelvic ring fracture. Mild levoscoliosis in the lumbar spine with multilevel spondylitic change. IMPRESSION: 1. Left subcapital femoral neck fracture with impaction and displacement. 2. Large hiatal hernia involving the entire stomach. 3. Coronary and Aortic Atherosclerosis (ICD10-I70.0). 4. Colonic diverticulosis. Electronically Signed   By: Lucrezia Europe M.D.   On: 09/01/2018 16:23   Ct Head Wo Contrast  Result Date: 09/01/2018 CLINICAL DATA:  Fall. EXAM: CT HEAD WITHOUT CONTRAST CT CERVICAL SPINE WITHOUT CONTRAST TECHNIQUE: Multidetector CT imaging of the head and cervical spine was performed following the standard protocol without intravenous contrast. Multiplanar CT image reconstructions of the cervical spine were also generated. COMPARISON:  06/13/2017 FINDINGS: CT HEAD FINDINGS Brain: No acute infarct, intracranial hemorrhage, mass effect, or extra-axial fluid collection is identified. Cerebral white matter hypodensities are similar to the prior study and nonspecific but compatible with mild chronic small vessel ischemic disease. Mild cerebral atrophy is also unchanged. Vascular: Calcified atherosclerosis at the skull base. No hyperdense vessel. Skull: No fracture or suspicious osseous lesion. Sinuses/Orbits: Clear paranasal sinuses. Small chronic right mastoid effusion. Bilateral cataract extraction. Other: None. CT CERVICAL SPINE FINDINGS Alignment: Unchanged minimal retrolisthesis of C3 on C4 and C5 on C6 and minimal anterolisthesis of C6 on C7 and C7 on T1. Skull base and vertebrae: No acute fracture or suspicious osseous lesion. Mild median C1-2 arthropathy. Soft tissues and spinal canal: No prevertebral fluid or swelling. No visible canal hematoma. Disc levels: Similar appearance of multilevel disc degeneration including up to severe disc space narrowing from  C3-4 to C6-7. Moderate multilevel neural foraminal stenosis due to uncovertebral spurring. Upper chest: Asymmetric right apical pleuroparenchymal scarring. Other: Diminutive or absent left thyroid lobe. Small hypodense nodules in the right thyroid lobe measuring up to approximately 1.2 cm, not significantly changed. Moderate calcified atherosclerosis at the carotid bifurcations. IMPRESSION: 1. No evidence of acute intracranial abnormality. 2. No evidence of acute fracture or traumatic subluxation in the cervical spine. Electronically Signed   By: Logan Bores M.D.   On: 09/01/2018 16:28   Ct Chest Wo Contrast  Result Date: 09/01/2018 CLINICAL DATA:  [place], left lower extremity pain EXAM: CT CHEST, ABDOMEN AND PELVIS WITHOUT CONTRAST TECHNIQUE: Multidetector CT imaging of the chest, abdomen and pelvis was performed following the standard protocol without IV contrast. COMPARISON:  06/20/2017 and previous FINDINGS: CT CHEST FINDINGS Cardiovascular: Coronary and Aortic Atherosclerosis (ICD10-170.0). Tortuous descending thoracic aorta. Heart size normal. No pericardial effusion. Mediastinum/Nodes: Large hiatal hernia involving virtually the entire stomach. No hilar or mediastinal adenopathy. No mediastinal hematoma. Lungs/Pleura: No pneumothorax. Coarse peripheral airspace opacities. Significant improvement in the pleural effusions seen previously with minimal trace left residual. Improved aeration in the lung bases with scattered coarse airspace opacities in the posterior right lower lobe and medial left lower lobe with atelectasis. Musculoskeletal: No acute fracture. Thoracic dextroscoliosis with anterior vertebral endplate spurring at multiple levels in the mid and lower thoracic spine. Bilateral shoulder DJD. CT ABDOMEN PELVIS FINDINGS Hepatobiliary: No focal liver abnormality is seen. No gallstones, gallbladder wall thickening, or biliary dilatation. Pancreas: Unremarkable. No pancreatic ductal dilatation or  surrounding inflammatory changes. Spleen: Normal in size without focal abnormality. Adrenals/Urinary Tract: No evidence of adrenal mass. No hydronephrosis. No evidence of renal mass. Urinary bladder physiologically distended. Stomach/Bowel: Virtually the entire stomach is involved in a large hiatal hernia. Small bowel is nondilated. Normal appendix. Scattered colonic diverticula most numerous in the distal descending and sigmoid segments, without significant adjacent inflammatory/edematous change or abscess. Vascular/Lymphatic: Aortoiliac arterial calcifications without aneurysm. No abdominal or pelvic adenopathy. Reproductive: Uterus and bilateral adnexa are unremarkable. Other: No ascites. Left pelvic phleboliths. No free air. Musculoskeletal: Subcapital left femoral neck fracture with impaction and displacement, resulting varus deformity. No pelvic ring fracture. Mild levoscoliosis in the lumbar spine with multilevel spondylitic change. IMPRESSION: 1. Left subcapital femoral neck fracture with impaction and displacement. 2. Large hiatal hernia involving the entire stomach. 3. Coronary and Aortic Atherosclerosis (ICD10-I70.0). 4. Colonic diverticulosis. Electronically Signed   By: Lucrezia Europe M.D.   On: 09/01/2018 16:23   Ct Cervical Spine Wo Contrast  Result Date: 09/01/2018 CLINICAL DATA:  Fall. EXAM: CT HEAD WITHOUT CONTRAST CT CERVICAL SPINE WITHOUT CONTRAST TECHNIQUE: Multidetector CT imaging of the head and cervical spine was performed following the standard protocol without intravenous contrast. Multiplanar CT image reconstructions of the cervical spine were also generated. COMPARISON:  06/13/2017 FINDINGS: CT HEAD FINDINGS Brain: No acute infarct, intracranial hemorrhage, mass effect, or extra-axial fluid collection is identified. Cerebral white matter hypodensities are similar to the prior study and nonspecific but compatible with mild chronic small vessel ischemic disease. Mild cerebral atrophy is also  unchanged. Vascular: Calcified atherosclerosis at the skull base. No hyperdense vessel. Skull: No fracture or suspicious osseous lesion. Sinuses/Orbits: Clear paranasal sinuses. Small chronic right mastoid effusion. Bilateral cataract extraction. Other: None. CT CERVICAL SPINE FINDINGS Alignment: Unchanged minimal retrolisthesis of C3 on C4 and C5 on C6 and minimal anterolisthesis of C6 on C7 and C7 on T1. Skull base and vertebrae: No acute fracture or suspicious osseous  lesion. Mild median C1-2 arthropathy. Soft tissues and spinal canal: No prevertebral fluid or swelling. No visible canal hematoma. Disc levels: Similar appearance of multilevel disc degeneration including up to severe disc space narrowing from C3-4 to C6-7. Moderate multilevel neural foraminal stenosis due to uncovertebral spurring. Upper chest: Asymmetric right apical pleuroparenchymal scarring. Other: Diminutive or absent left thyroid lobe. Small hypodense nodules in the right thyroid lobe measuring up to approximately 1.2 cm, not significantly changed. Moderate calcified atherosclerosis at the carotid bifurcations. IMPRESSION: 1. No evidence of acute intracranial abnormality. 2. No evidence of acute fracture or traumatic subluxation in the cervical spine. Electronically Signed   By: Logan Bores M.D.   On: 09/01/2018 16:28   Dg Chest Port 1 View  Result Date: 09/02/2018 CLINICAL DATA:  Shortness of breath. EXAM: PORTABLE CHEST 1 VIEW COMPARISON:  CT chest and chest x-ray from yesterday. FINDINGS: Small stable cardiomediastinal silhouette and large hiatal hernia. Atherosclerotic calcification of the aortic arch. Normal pulmonary vascularity. Low lung volumes with bibasilar atelectasis. Unchanged trace left pleural effusion. No consolidation or pneumothorax. No acute osseous abnormality. IMPRESSION: 1. Bibasilar atelectasis.  Unchanged trace left pleural effusion. Electronically Signed   By: Titus Dubin M.D.   On: 09/02/2018 09:18   Dg  Chest Port 1 View  Result Date: 09/01/2018 CLINICAL DATA:  Fall. EXAM: PORTABLE CHEST 1 VIEW COMPARISON:  June 20, 2017. FINDINGS: There is a large hiatal hernia again identified. The heart, hila, mediastinum, pleura are unremarkable. There are small pleural effusions, left greater than right. Mild interstitial prominence without overt edema. No new infiltrates. IMPRESSION: 1. Small effusions, left greater than right. 2. Large hiatal hernia. 3. Pulmonary venous congestion without overt edema. Electronically Signed   By: Dorise Bullion III M.D   On: 09/01/2018 15:27   Dg Hip Unilat W Or Wo Pelvis 2-3 Views Left  Result Date: 09/01/2018 CLINICAL DATA:  Status post fall, left hip fracture EXAM: DG HIP (WITH OR WITHOUT PELVIS) 2-3V LEFT COMPARISON:  None. FINDINGS: Severe osteopenia. Displaced left subcapital femoral neck fracture.  No dislocation. No other acute fracture or dislocation. IMPRESSION: 1. Acute displaced left subcapital femoral neck fracture. Electronically Signed   By: Kathreen Devoid   On: 09/01/2018 15:28     Antimicrobials:   PERIOPERATIVE PER ORTHO   Subjective: Patient remains confused No acute decompensation overnight  Objective: Vitals:   09/03/18 2016 09/04/18 0413 09/04/18 0444 09/04/18 1342  BP: 124/72 (!) 156/54  (!) 75/57  Pulse: (!) 113 93 82 89  Resp: 17 (!) 25    Temp: 97.6 F (36.4 C) 97.8 F (36.6 C)  98.9 F (37.2 C)  TempSrc: Oral Oral  Axillary  SpO2:  (!) 77% 94% (!) 83%  Weight:      Height:        Intake/Output Summary (Last 24 hours) at 09/04/2018 1553 Last data filed at 09/04/2018 1500 Gross per 24 hour  Intake 450 ml  Output 450 ml  Net 0 ml   Filed Weights   09/01/18 1431  Weight: 72.6 kg    Examination:  General exam: Confused but calm Respiratory system: Clear to auscultation. Respiratory effort normal. Cardiovascular system: S1 & S2 heard, RRR. No JVD, murmurs, rubs, gallops or clicks. No pedal edema. Gastrointestinal system:  Abdomen is nondistended, soft and nontender. . Normal bowel sounds heard. Central nervous system: Alert, No focal neurological deficits. Extremities: Externally rotated lower extremity otherwise very limited exam secondary to patient's inability to follow commands Skin: No rashes,  lesions or ulcers Psychiatry: Judgment and insight are not intact, patient with known advanced dementia, no evidence of acute decompensation..     Data Reviewed: I have personally reviewed following labs and imaging studies  CBC: Recent Labs  Lab 09/01/18 1501 09/02/18 0234  WBC 2.9* 7.7  NEUTROABS 1.6*  --   HGB 13.2 12.5  HCT 40.1 37.3  MCV 102.0* 101.4*  PLT 140* 989*   Basic Metabolic Panel: Recent Labs  Lab 09/01/18 1501 09/02/18 0234  NA 139 140  K 5.0 4.2  CL 97* 99  CO2 28 28  GLUCOSE 93 186*  BUN 40* 42*  CREATININE 1.45* 1.31*  CALCIUM 9.6 9.1   GFR: Estimated Creatinine Clearance: 25.1 mL/min (A) (by C-G formula based on SCr of 1.31 mg/dL (H)). Liver Function Tests: Recent Labs  Lab 09/01/18 1501  AST 20  ALT 19  ALKPHOS 80  BILITOT 0.5  PROT 6.8  ALBUMIN 4.1   No results for input(s): LIPASE, AMYLASE in the last 168 hours. No results for input(s): AMMONIA in the last 168 hours. Coagulation Profile: Recent Labs  Lab 09/01/18 1501 09/02/18 0234  INR 0.9 1.1   Cardiac Enzymes: No results for input(s): CKTOTAL, CKMB, CKMBINDEX, TROPONINI in the last 168 hours. BNP (last 3 results) No results for input(s): PROBNP in the last 8760 hours. HbA1C: No results for input(s): HGBA1C in the last 72 hours. CBG: No results for input(s): GLUCAP in the last 168 hours. Lipid Profile: No results for input(s): CHOL, HDL, LDLCALC, TRIG, CHOLHDL, LDLDIRECT in the last 72 hours. Thyroid Function Tests: No results for input(s): TSH, T4TOTAL, FREET4, T3FREE, THYROIDAB in the last 72 hours. Anemia Panel: No results for input(s): VITAMINB12, FOLATE, FERRITIN, TIBC, IRON, RETICCTPCT in  the last 72 hours. Sepsis Labs: No results for input(s): PROCALCITON, LATICACIDVEN in the last 168 hours.  Recent Results (from the past 240 hour(s))  SARS Coronavirus 2 (CEPHEID - Performed in [place] hospital lab), Hosp Order     Status: None   Collection Time: 09/01/18  3:44 PM  Result Value Ref Range Status   SARS Coronavirus 2 NEGATIVE NEGATIVE Final    Comment: (NOTE) If result is NEGATIVE SARS-CoV-2 target nucleic acids are NOT DETECTED. The SARS-CoV-2 RNA is generally detectable in upper and lower  respiratory specimens during the acute phase of infection. The lowest  concentration of SARS-CoV-2 viral copies this assay can detect is 250  copies / mL. A negative result does not preclude SARS-CoV-2 infection  and should not be used as the sole basis for treatment or other  patient management decisions.  A negative result may occur with  improper specimen collection / handling, submission of specimen other  than nasopharyngeal swab, presence of viral mutation(s) within the  areas targeted by this assay, and inadequate number of viral copies  (<250 copies / mL). A negative result must be combined with clinical  observations, patient history, and epidemiological information. If result is POSITIVE SARS-CoV-2 target nucleic acids are DETECTED. The SARS-CoV-2 RNA is generally detectable in upper and lower  respiratory specimens dur ing the acute phase of infection.  Positive  results are indicative of active infection with SARS-CoV-2.  Clinical  correlation with patient history and other diagnostic information is  necessary to determine patient infection status.  Positive results do  not rule out bacterial infection or co-infection with other viruses. If result is PRESUMPTIVE POSTIVE SARS-CoV-2 nucleic acids MAY BE PRESENT.   A presumptive positive result was obtained on  the submitted specimen  and confirmed on repeat testing.  While 2019 novel coronavirus  (SARS-CoV-2)  nucleic acids may be present in the submitted sample  additional confirmatory testing may be necessary for epidemiological  and / or clinical management purposes  to differentiate between  SARS-CoV-2 and other Sarbecovirus currently known to infect humans.  If clinically indicated additional testing with an alternate test  methodology [phone]) is advised. The SARS-CoV-2 RNA is generally  detectable in upper and lower respiratory sp ecimens during the acute  phase of infection. The expected result is Negative. Fact Sheet for Patients:  StrictlyIdeas.no Fact Sheet for Healthcare Providers: BankingDealers.co.za This test is not yet approved or cleared by the [place] FDA and has been authorized for detection and/or diagnosis of SARS-CoV-2 by FDA under an Emergency Use Authorization (EUA).  This EUA will remain in effect (meaning this test can be used) for the duration of the COVID-19 declaration under Section 564(b)(1) of the Act, 21 U.S.C. section 360bbb-3(b)(1), unless the authorization is terminated or revoked sooner. Performed at [facility], [place] [address] [place], [place] [address]   Surgical pcr screen     Status: Abnormal   Collection Time: 09/01/18  8:30 PM  Result Value Ref Range Status   MRSA, PCR POSITIVE (A) NEGATIVE Final    Comment: RESULT CALLED TO, READ BACK BY AND VERIFIED WITH: HANDY,C RN 09/01/2018 AT 2203 SKEEN,P    Staphylococcus aureus POSITIVE (A) NEGATIVE Final    Comment: (NOTE) The Xpert SA Assay (FDA approved for NASAL specimens in patients 32 years of age and older), is one component of a comprehensive surveillance program. It is not intended to diagnose infection nor to guide or monitor treatment. Performed at [facility], [place] [address] [place], [place] [address]          Radiology Studies: No results found.      Scheduled Meds:  acetaminophen (TYLENOL) oral liquid 160  mg/5 mL  650 mg Oral Q8H   artificial tears  1 application Both Eyes BID   chlorhexidine  60 mL Topical Once   Chlorhexidine Gluconate Cloth  6 each Topical Q0600   divalproex  125 mg Oral TID   escitalopram  10 mg Oral Daily   ketotifen  1 drop Both Eyes BID   levothyroxine  100 mcg Oral QAC breakfast   Melatonin  7.5 mg Oral QHS   mupirocin ointment  1 application Nasal BID   OLANZapine zydis  2.5 mg Oral q morning - 10a   OLANZapine zydis  7.5 mg Oral QHS   povidone-iodine  2 application Topical Once   senna-docusate  2 tablet Oral QHS   Continuous Infusions:  dextrose 5 % and 0.45% NaCl Stopped (09/01/18 1938)     LOS: 3 days    Time spent: 34 min    Nicolette Bang, MD [facility]  If 7PM-7AM, please contact night-coverage  09/04/2018, 3:53 PM

## 2018-09-04 NOTE — Progress Notes (Signed)
Daily Progress Note   Patient Name: Cristina Middleton       Date: 09/04/2018 DOB: [birth year]  Age: 85 y.o. MRN#: [id number] Attending Physician: Marcell Anger* Primary Care Physician: Jani Gravel, MD Admit Date: 09/01/2018  Reason for Consultation/Follow-up: Establishing goals of care and Psychosocial/spiritual support  Subjective:  I spoke with bedside RN & Tech.  Patient is eating only bites and sips.  She has not yet had a bowel movement.  She seems generally comfortable but will occasionally moan and receive PRN pain medication.  I spoke with Diane (daughter) on the phone.  We discussed her mother's current condition.  I expressed my concern that her mother's time is likely short whether or not she has surgery.  Diane accepted the information.  She brought up difficulty finding a SNF for her mother - I explained that I felt we may be looking at hospice house if Yaris does not begin to eat and drink more.  Diane then asked for more information about hospice house.    4:30 video call with extended family including son, Tharon Aquas, daughter Shauna Hugh and DIL.  Family watched the patient on video for over 30+ minutes.  She was quite delirious - attempting to pull off her gown and calling the name of one of her sisters who passed away 1 year ago.    After the video call I spoke with the family privately.  They understood that their mother was having bouts of hypoxia and hypotension - further she is now barely eating and delirious.  She is clearly not a surgical candidate at this point.  Family opted for hospice house placement.  We discussed locations and logistics.  After our discussion I put their mother back on video and they were able to tell her they loved her.  Unfortunately she did not respond  in kind - she was focused on getting her gown off.  Assessment:  Mild to moderate intermittent pain.  Eating only sips and bites.  Delirious.  Appears worse than yesterday.   Patient Profile/HPI:  85 y.o. female  with past medical history of advanced dementia, large hiatal hernia, OSA, macular degeneration, DM2 and hearing loss who was admitted on 09/01/2018 after a fall with left hip fracture, AKI, and hypoxic respiratory failure.  She has been seen by orthopedic surgery  and anesthesia.  Surgery has been offered and the family understands it is high risk.  Family requested a Palliative consult to assist with figuring out next steps.   Length of Stay: 3  Current Medications: Scheduled Meds:  . acetaminophen (TYLENOL) oral liquid 160 mg/5 mL  650 mg Oral Q8H  . artificial tears  1 application Both Eyes BID  . chlorhexidine  60 mL Topical Once  . Chlorhexidine Gluconate Cloth  6 each Topical Q0600  . divalproex  125 mg Oral TID  . escitalopram  10 mg Oral Daily  . ketotifen  1 drop Both Eyes BID  . levothyroxine  100 mcg Oral QAC breakfast  . Melatonin  7.5 mg Oral QHS  . mupirocin ointment  1 application Nasal BID  . OLANZapine zydis  2.5 mg Oral q morning - 10a  . OLANZapine zydis  7.5 mg Oral QHS  . povidone-iodine  2 application Topical Once  . senna-docusate  2 tablet Oral QHS    Continuous Infusions: . dextrose 5 % and 0.45% NaCl Stopped (09/01/18 1938)    PRN Meds: acetaminophen **OR** acetaminophen, albuterol, LORazepam, ondansetron **OR** ondansetron (ZOFRAN) IV, oxyCODONE, polyethylene glycol, polyvinyl alcohol, traZODone  Physical Exam        Elderly demented female, NAD, very HOH, "I don't want to wake up".  Later in the day she became delirious. CV rrr resp no distress  Abdomen soft, nt, nd   Vital Signs: BP (!) 75/57 (BP Location: Right Arm)   Pulse 89   Temp 98.9 F (37.2 C) (Axillary)   Resp (!) 25   Ht 5\' 4"  (1.626 m)   Wt 72.6 kg   SpO2 (!) 83%   BMI  27.46 kg/m  SpO2: SpO2: (!) 83 % O2 Device: O2 Device: NRB O2 Flow Rate: O2 Flow Rate (L/min): 3 L/min  Intake/output summary:   Intake/Output Summary (Last 24 hours) at 09/04/2018 1514 Last data filed at 09/04/2018 0900 Gross per 24 hour  Intake 450 ml  Output 450 ml  Net 0 ml   LBM: Last BM Date: (PTA ) Baseline Weight: Weight: 72.6 kg Most recent weight: Weight: 72.6 kg       Palliative Assessment/Data:  20%    Flowsheet Rows     Most Recent Value  Intake Tab  Referral Department  Hospitalist  Unit at Time of Referral  Med/Surg Unit  Palliative Care Primary Diagnosis  Trauma  Date Notified  09/02/18  Palliative Care Type  Return patient Palliative Care  Reason for referral  Clarify Goals of Care  Date of Admission  09/01/18  Date first seen by Palliative Care  09/03/18  # of days Palliative referral response time  1 Day(s)  # of days IP prior to Palliative referral  1  Clinical Assessment  Psychosocial & Spiritual Assessment  Palliative Care Outcomes      Patient Active Problem List   Diagnosis Date Noted  . Dementia associated with other underlying disease without behavioral disturbance ([place])   . Palliative care encounter   . Pressure injury of skin 09/02/2018  . Closed left hip fracture ([place]) 09/01/2018  . Fall at nursing home 09/01/2018  . Osteopenia 09/01/2018  . Hypoxemia 09/01/2018  . AKI (acute kidney injury) ([place]) 09/01/2018  . Leukopenia 09/01/2018  . Thrombocytopenia ([place]) 09/01/2018  . Altered mental status   . Fall   . Fever   . CHF (congestive heart failure) ([place])   . Goals of care, counseling/discussion   . Palliative  care by specialist   . SIRS (systemic inflammatory response syndrome) ([place]) 06/14/2017  . Macrocytic anemia 06/14/2017  . Multiple falls 06/14/2017  . Adjustment disorder with mixed disturbance of emotions and conduct 05/07/2017  . Vascular dementia of acute onset with behavioral disturbance ([place]) 05/07/2017  . Anxiety  12/09/2016  . Hyperlipidemia 12/09/2016  . Acute pancreatitis 12/08/2016  . GIB (gastrointestinal bleeding) 06/13/2016  . Bright red blood per rectum 06/13/2016  . Influenza with respiratory manifestation 06/13/2016  . Acute respiratory failure with hypoxia ([place]) 06/13/2016  . Reactive airway disease 06/13/2016  . Varicose veins of left lower extremity with complications 54/27/0623  . SOB (shortness of breath) 06/28/2014  . Abdominal pain 06/28/2014  . Encounter for nasogastric (NG) tube placement   . SBO (small bowel obstruction) ([place]) 06/27/2014  . Diabetes mellitus without complication ([place]) 76/28/3151  . Acute blood loss anemia 05/26/2012  . HTN (hypertension) 05/25/2012  . Diabetes mellitus, type 2 ([place]) 05/25/2012  . Osteoarthritis of right knee 05/25/2012  . Sleep apnea 05/25/2012  . Hiatal hernia with GERD 05/25/2012  . Hypothyroidism 05/25/2012    Palliative Care Plan    Recommendations/Plan:  Increased dose of Olanzepine due to delirium.  Will also add low dose PRN haldol.  Will shift orders to comfort measures only.  CSW order for [facility].  Family is close to [facility] but would also be happy at [facility].  Goals of Care and Additional Recommendations:  Limitations on Scope of Treatment: Full Comfort Care  Code Status:  DNR  Prognosis:   < 2 weeks 85 yo with hypoxia, hypotension and new left hip fracture, barely eating, delirious, advanced dementia.   Discharge Planning:  Hospice facility  Care plan was discussed with family, attending MD and Orthopedic surgery.  Thank you for allowing the Palliative Medicine Team to assist in the care of this patient.  Total time spent:  105 min.  Time In 2:00 Time out 2:45 Time in 4:30 Time out 5:30     Greater than 50%  of this time was spent counseling and coordinating care related to the above assessment and plan.  Florentina Jenny, PA-C Palliative Medicine  Please contact Palliative MedicineTeam  phone at [phone] for questions and concerns between 7 am - 7 pm.   Please see AMION for individual provider pager numbers.

## 2018-09-05 MED ORDER — HYDROMORPHONE HCL 1 MG/ML IJ SOLN: 0.2500 mg | INTRAMUSCULAR | 0 refills | Status: AC | PRN

## 2018-09-05 MED ORDER — HALOPERIDOL 0.5 MG PO TABS: 0.5000 mg | ORAL_TABLET | ORAL | Status: AC | PRN

## 2018-09-05 NOTE — Social Work (Signed)
Clinical Social Worker facilitated patient discharge including contacting patient family and facility to confirm patient discharge plans.  Clinical information faxed to facility and family agreeable with plan.  CSW arranged ambulance transport via [facility] to [facility] RN to call [phone]  with report prior to discharge.  Clinical Social Worker will sign off for now as social work intervention is no longer needed. Please consult us again if new need arises.  Melvena Vink, MSW, LCSWA Clinical Social Worker [phone]  

## 2018-09-05 NOTE — Discharge Summary (Signed)
Physician Discharge Summary  Cristina Middleton [facility] ZOX:[id number] DOB: [birth year] DOA: 09/01/2018  PCP: Jani Gravel, MD  Admit date: 09/01/2018 Discharge date: 09/05/2018  Admitted From: [facility]leton Disposition:  [place]  Recommendations for Outpatient Follow-up:  1. Follow up with Hospice services  Discharge Condition:Terminal CODE STATUS:DNR, comfort Diet recommendation: Comfort   Brief/Interim Summary: 85 year old female with advanced dementia, diabetes type 2, hypertension, obstructive sleep apnea not on CPAP, hypothyroid and anxiety who presented from [facility] memory unit with fall.  Patient was found to have hip fracture.  Several conversations occurred with family with plan for 30 meeting today to determine most appropriate intervention.  The primary goal of that meeting is to determine whether patient will have surgery secondary to her advanced dementia and her likely inability to follow any postoperative rehab or precautions.   Discharge Diagnoses:  Principal Problem:   Closed left hip fracture ([place]) Active Problems:   HTN (hypertension)   Diabetes mellitus, type 2 (HCC)   Sleep apnea   Hiatal hernia with GERD   Hypothyroidism   Fall at nursing home   Osteopenia   Hypoxemia   AKI (acute kidney injury) ([place])   Leukopenia   Thrombocytopenia (HCC)   Pressure injury of skin   Dementia associated with other underlying disease without behavioral disturbance ([place])   Palliative care encounter   Acute delirium   Comfort measures only status  Left subcapital femoral neck fracture with impaction and displacement: -Seen by Orthopedic Surgery. Determined not to be surgical candidate, see below -Continue with analgesics as needed  Acute hypoxic respiratory failure. -Patient had a rapid response with low sats.  Cristina Middleton was obtained which showed no edema no evidence of CHF no infiltrates.  -Declining. Currently with non-rebreather in place. Comfort care  Acute kidney injury  -now  comfort care only  Advanced dementia.  Patient is unable to follow any commands. Supportive care Appreciate input by Palliative Care. Plan for d/c to residential hospice  Thrombocytopenia: No bleeding   Large hiatal hernia This is noted on chest x-ray and CT of chest. Comfort care only  Diabetes type 2 not on medications Hemoglobin A1c of 5.3 in February 2019 and 5.9   Discharge Instructions   Allergies as of 09/05/2018      Reactions   Penicillins Hives, Itching, Swelling, Other (See Comments)   Reaction:  Unspecified swelling reaction Has patient had a PCN reaction causing immediate rash, facial/tongue/throat swelling, SOB or lightheadedness with hypotension: Yes Has patient had a PCN reaction causing severe rash involving mucus membranes or skin necrosis: No Has patient had a PCN reaction that required hospitalization No Has patient had a PCN reaction occurring within the last 10 years: No If all of the above answers are "NO", then may proceed with Cephalosporin use.   Sulfa Antibiotics Hives, Itching, Swelling, Other (See Comments)   Reaction:  Unspecified swelling reaction   Alka-seltzer [aspirin Effervescent] Other (See Comments)   Hallucinations      Medication List    STOP taking these medications   aspirin EC 81 MG tablet   BAZA EX   calcium carbonate 1500 (600 Ca) MG Tabs tablet Commonly known as:  OSCAL   CeraVe Lotn   coal tar 0.5 % shampoo Commonly known as:  NEUTROGENA T-GEL   escitalopram 10 MG tablet Commonly known as:  LEXAPRO   feeding supplement (ENSURE ENLIVE) Liqd   ICY HOT EX   ketotifen 0.025 % ophthalmic solution Commonly known as:  ZADITOR   lansoprazole 3 mg/ml  Susp oral suspension Commonly known as:  PREVACID   lansoprazole 30 MG capsule Commonly known as:  PREVACID   levothyroxine 100 MCG tablet Commonly known as:  SYNTHROID   OVER THE COUNTER MEDICATION   potassium chloride 10 MEQ tablet Commonly known as:   K-DUR   triamcinolone cream 0.1 % Commonly known as:  KENALOG   triamterene-hydrochlorothiazide 75-50 MG tablet Commonly known as:  MAXZIDE   vitamin B-12 1000 MCG tablet Commonly known as:  CYANOCOBALAMIN     TAKE these medications   acetaminophen 650 MG CR tablet Commonly known as:  TYLENOL Take 650 mg by mouth 2 (two) times a day.   artificial tears Oint ophthalmic ointment Place 1 application into both eyes 2 (two) times a day.   bisacodyl 10 MG suppository Commonly known as:  DULCOLAX Place 10 mg rectally daily as needed for moderate constipation.   divalproex 125 MG capsule Commonly known as:  DEPAKOTE SPRINKLE Take 125 mg by mouth 3 (three) times daily.   docusate sodium 100 MG capsule Commonly known as:  COLACE Take 100 mg by mouth 2 (two) times daily.   haloperidol 0.5 MG tablet Commonly known as:  HALDOL Take 1 tablet (0.5 mg total) by mouth every 4 (four) hours as needed for agitation (or delirium).   HYDROmorphone 1 MG/ML injection Commonly known as:  DILAUDID Inject 0.25 mLs (0.25 mg total) into the vein every 2 (two) hours as needed for severe pain (or dyspnea).   loratadine 10 MG tablet Commonly known as:  CLARITIN Take 10 mg by mouth daily as needed for allergies.   LORazepam 0.5 MG tablet Commonly known as:  ATIVAN Take 1 tablet (0.5 mg total) by mouth daily as needed for anxiety. What changed:  when to take this   MELATONIN PO Take 7.5 mg by mouth at bedtime.   OLANZapine 7.5 MG tablet Commonly known as:  ZYPREXA Take 7.5 mg by mouth at bedtime. What changed:  Another medication with the same name was removed. Continue taking this medication, and follow the directions you see here.   polyethylene glycol 17 g packet Commonly known as:  MIRALAX / GLYCOLAX Take 17 g by mouth daily.   polyvinyl alcohol 1.4 % ophthalmic solution Commonly known as:  LIQUIFILM TEARS Place 1 drop into both eyes as needed for dry eyes.      Follow-up  Information    Follow up with Hospice services Follow up.          Allergies  Allergen Reactions  . Penicillins Hives, Itching, Swelling and Other (See Comments)    Reaction:  Unspecified swelling reaction Has patient had a PCN reaction causing immediate rash, facial/tongue/throat swelling, SOB or lightheadedness with hypotension: Yes Has patient had a PCN reaction causing severe rash involving mucus membranes or skin necrosis: No Has patient had a PCN reaction that required hospitalization No Has patient had a PCN reaction occurring within the last 10 years: No If all of the above answers are "NO", then may proceed with Cephalosporin use.  . Sulfa Antibiotics Hives, Itching, Swelling and Other (See Comments)    Reaction:  Unspecified swelling reaction  . Alka-Seltzer [Aspirin Effervescent] Other (See Comments)    Hallucinations    Consultations:  Orthopedic Surgery  Palliative Care  Procedures/Studies: Ct Abdomen Pelvis Wo Contrast  Result Date: 09/01/2018 CLINICAL DATA:  [place], left lower extremity pain EXAM: CT CHEST, ABDOMEN AND PELVIS WITHOUT CONTRAST TECHNIQUE: Multidetector CT imaging of the chest, abdomen and pelvis was performed  following the standard protocol without IV contrast. COMPARISON:  06/20/2017 and previous FINDINGS: CT CHEST FINDINGS Cardiovascular: Coronary and Aortic Atherosclerosis (ICD10-170.0). Tortuous descending thoracic aorta. Heart size normal. No pericardial effusion. Mediastinum/Nodes: Large hiatal hernia involving virtually the entire stomach. No hilar or mediastinal adenopathy. No mediastinal hematoma. Lungs/Pleura: No pneumothorax. Coarse peripheral airspace opacities. Significant improvement in the pleural effusions seen previously with minimal trace left residual. Improved aeration in the lung bases with scattered coarse airspace opacities in the posterior right lower lobe and medial left lower lobe with atelectasis. Musculoskeletal: No acute fracture.  Thoracic dextroscoliosis with anterior vertebral endplate spurring at multiple levels in the mid and lower thoracic spine. Bilateral shoulder DJD. CT ABDOMEN PELVIS FINDINGS Hepatobiliary: No focal liver abnormality is seen. No gallstones, gallbladder wall thickening, or biliary dilatation. Pancreas: Unremarkable. No pancreatic ductal dilatation or surrounding inflammatory changes. Spleen: Normal in size without focal abnormality. Adrenals/Urinary Tract: No evidence of adrenal mass. No hydronephrosis. No evidence of renal mass. Urinary bladder physiologically distended. Stomach/Bowel: Virtually the entire stomach is involved in a large hiatal hernia. Small bowel is nondilated. Normal appendix. Scattered colonic diverticula most numerous in the distal descending and sigmoid segments, without significant adjacent inflammatory/edematous change or abscess. Vascular/Lymphatic: Aortoiliac arterial calcifications without aneurysm. No abdominal or pelvic adenopathy. Reproductive: Uterus and bilateral adnexa are unremarkable. Other: No ascites. Left pelvic phleboliths. No free air. Musculoskeletal: Subcapital left femoral neck fracture with impaction and displacement, resulting varus deformity. No pelvic ring fracture. Mild levoscoliosis in the lumbar spine with multilevel spondylitic change. IMPRESSION: 1. Left subcapital femoral neck fracture with impaction and displacement. 2. Large hiatal hernia involving the entire stomach. 3. Coronary and Aortic Atherosclerosis (ICD10-I70.0). 4. Colonic diverticulosis. Electronically Signed   By: Lucrezia Europe M.D.   On: 09/01/2018 16:23   Ct Head Wo Contrast  Result Date: 09/01/2018 CLINICAL DATA:  Fall. EXAM: CT HEAD WITHOUT CONTRAST CT CERVICAL SPINE WITHOUT CONTRAST TECHNIQUE: Multidetector CT imaging of the head and cervical spine was performed following the standard protocol without intravenous contrast. Multiplanar CT image reconstructions of the cervical spine were also  generated. COMPARISON:  06/13/2017 FINDINGS: CT HEAD FINDINGS Brain: No acute infarct, intracranial hemorrhage, mass effect, or extra-axial fluid collection is identified. Cerebral white matter hypodensities are similar to the prior study and nonspecific but compatible with mild chronic small vessel ischemic disease. Mild cerebral atrophy is also unchanged. Vascular: Calcified atherosclerosis at the skull base. No hyperdense vessel. Skull: No fracture or suspicious osseous lesion. Sinuses/Orbits: Clear paranasal sinuses. Small chronic right mastoid effusion. Bilateral cataract extraction. Other: None. CT CERVICAL SPINE FINDINGS Alignment: Unchanged minimal retrolisthesis of C3 on C4 and C5 on C6 and minimal anterolisthesis of C6 on C7 and C7 on T1. Skull base and vertebrae: No acute fracture or suspicious osseous lesion. Mild median C1-2 arthropathy. Soft tissues and spinal canal: No prevertebral fluid or swelling. No visible canal hematoma. Disc levels: Similar appearance of multilevel disc degeneration including up to severe disc space narrowing from C3-4 to C6-7. Moderate multilevel neural foraminal stenosis due to uncovertebral spurring. Upper chest: Asymmetric right apical pleuroparenchymal scarring. Other: Diminutive or absent left thyroid lobe. Small hypodense nodules in the right thyroid lobe measuring up to approximately 1.2 cm, not significantly changed. Moderate calcified atherosclerosis at the carotid bifurcations. IMPRESSION: 1. No evidence of acute intracranial abnormality. 2. No evidence of acute fracture or traumatic subluxation in the cervical spine. Electronically Signed   By: Logan Bores M.D.   On: 09/01/2018 16:28   Ct Chest  Wo Contrast  Result Date: 09/01/2018 CLINICAL DATA:  [place], left lower extremity pain EXAM: CT CHEST, ABDOMEN AND PELVIS WITHOUT CONTRAST TECHNIQUE: Multidetector CT imaging of the chest, abdomen and pelvis was performed following the standard protocol without IV contrast.  COMPARISON:  06/20/2017 and previous FINDINGS: CT CHEST FINDINGS Cardiovascular: Coronary and Aortic Atherosclerosis (ICD10-170.0). Tortuous descending thoracic aorta. Heart size normal. No pericardial effusion. Mediastinum/Nodes: Large hiatal hernia involving virtually the entire stomach. No hilar or mediastinal adenopathy. No mediastinal hematoma. Lungs/Pleura: No pneumothorax. Coarse peripheral airspace opacities. Significant improvement in the pleural effusions seen previously with minimal trace left residual. Improved aeration in the lung bases with scattered coarse airspace opacities in the posterior right lower lobe and medial left lower lobe with atelectasis. Musculoskeletal: No acute fracture. Thoracic dextroscoliosis with anterior vertebral endplate spurring at multiple levels in the mid and lower thoracic spine. Bilateral shoulder DJD. CT ABDOMEN PELVIS FINDINGS Hepatobiliary: No focal liver abnormality is seen. No gallstones, gallbladder wall thickening, or biliary dilatation. Pancreas: Unremarkable. No pancreatic ductal dilatation or surrounding inflammatory changes. Spleen: Normal in size without focal abnormality. Adrenals/Urinary Tract: No evidence of adrenal mass. No hydronephrosis. No evidence of renal mass. Urinary bladder physiologically distended. Stomach/Bowel: Virtually the entire stomach is involved in a large hiatal hernia. Small bowel is nondilated. Normal appendix. Scattered colonic diverticula most numerous in the distal descending and sigmoid segments, without significant adjacent inflammatory/edematous change or abscess. Vascular/Lymphatic: Aortoiliac arterial calcifications without aneurysm. No abdominal or pelvic adenopathy. Reproductive: Uterus and bilateral adnexa are unremarkable. Other: No ascites. Left pelvic phleboliths. No free air. Musculoskeletal: Subcapital left femoral neck fracture with impaction and displacement, resulting varus deformity. No pelvic ring fracture. Mild  levoscoliosis in the lumbar spine with multilevel spondylitic change. IMPRESSION: 1. Left subcapital femoral neck fracture with impaction and displacement. 2. Large hiatal hernia involving the entire stomach. 3. Coronary and Aortic Atherosclerosis (ICD10-I70.0). 4. Colonic diverticulosis. Electronically Signed   By: Lucrezia Europe M.D.   On: 09/01/2018 16:23   Ct Cervical Spine Wo Contrast  Result Date: 09/01/2018 CLINICAL DATA:  Fall. EXAM: CT HEAD WITHOUT CONTRAST CT CERVICAL SPINE WITHOUT CONTRAST TECHNIQUE: Multidetector CT imaging of the head and cervical spine was performed following the standard protocol without intravenous contrast. Multiplanar CT image reconstructions of the cervical spine were also generated. COMPARISON:  06/13/2017 FINDINGS: CT HEAD FINDINGS Brain: No acute infarct, intracranial hemorrhage, mass effect, or extra-axial fluid collection is identified. Cerebral white matter hypodensities are similar to the prior study and nonspecific but compatible with mild chronic small vessel ischemic disease. Mild cerebral atrophy is also unchanged. Vascular: Calcified atherosclerosis at the skull base. No hyperdense vessel. Skull: No fracture or suspicious osseous lesion. Sinuses/Orbits: Clear paranasal sinuses. Small chronic right mastoid effusion. Bilateral cataract extraction. Other: None. CT CERVICAL SPINE FINDINGS Alignment: Unchanged minimal retrolisthesis of C3 on C4 and C5 on C6 and minimal anterolisthesis of C6 on C7 and C7 on T1. Skull base and vertebrae: No acute fracture or suspicious osseous lesion. Mild median C1-2 arthropathy. Soft tissues and spinal canal: No prevertebral fluid or swelling. No visible canal hematoma. Disc levels: Similar appearance of multilevel disc degeneration including up to severe disc space narrowing from C3-4 to C6-7. Moderate multilevel neural foraminal stenosis due to uncovertebral spurring. Upper chest: Asymmetric right apical pleuroparenchymal scarring. Other:  Diminutive or absent left thyroid lobe. Small hypodense nodules in the right thyroid lobe measuring up to approximately 1.2 cm, not significantly changed. Moderate calcified atherosclerosis at the carotid bifurcations. IMPRESSION: 1. No  evidence of acute intracranial abnormality. 2. No evidence of acute fracture or traumatic subluxation in the cervical spine. Electronically Signed   By: Logan Bores M.D.   On: 09/01/2018 16:28   Dg Chest Port 1 View  Result Date: 09/02/2018 CLINICAL DATA:  Shortness of breath. EXAM: PORTABLE CHEST 1 VIEW COMPARISON:  CT chest and chest x-ray from yesterday. FINDINGS: Small stable cardiomediastinal silhouette and large hiatal hernia. Atherosclerotic calcification of the aortic arch. Normal pulmonary vascularity. Low lung volumes with bibasilar atelectasis. Unchanged trace left pleural effusion. No consolidation or pneumothorax. No acute osseous abnormality. IMPRESSION: 1. Bibasilar atelectasis.  Unchanged trace left pleural effusion. Electronically Signed   By: Titus Dubin M.D.   On: 09/02/2018 09:18   Dg Chest Port 1 View  Result Date: 09/01/2018 CLINICAL DATA:  Fall. EXAM: PORTABLE CHEST 1 VIEW COMPARISON:  June 20, 2017. FINDINGS: There is a large hiatal hernia again identified. The heart, hila, mediastinum, pleura are unremarkable. There are small pleural effusions, left greater than right. Mild interstitial prominence without overt edema. No new infiltrates. IMPRESSION: 1. Small effusions, left greater than right. 2. Large hiatal hernia. 3. Pulmonary venous congestion without overt edema. Electronically Signed   By: Dorise Bullion III M.D   On: 09/01/2018 15:27   Dg Hip Unilat W Or Wo Pelvis 2-3 Views Left  Result Date: 09/01/2018 CLINICAL DATA:  Status post fall, left hip fracture EXAM: DG HIP (WITH OR WITHOUT PELVIS) 2-3V LEFT COMPARISON:  None. FINDINGS: Severe osteopenia. Displaced left subcapital femoral neck fracture.  No dislocation. No other acute  fracture or dislocation. IMPRESSION: 1. Acute displaced left subcapital femoral neck fracture. Electronically Signed   By: Kathreen Devoid   On: 09/01/2018 15:28     Subjective: Unable to assess given mental status  Discharge Exam: Vitals:   09/04/18 1342 09/05/18 0450  BP: (!) 75/57 (!) 116/35  Pulse: 89 85  Resp:    Temp: 98.9 F (37.2 C) 98.3 F (36.8 C)  SpO2: (!) 83% (!) 84%   Vitals:   09/04/18 0413 09/04/18 0444 09/04/18 1342 09/05/18 0450  BP: (!) 156/54  (!) 75/57 (!) 116/35  Pulse: 93 82 89 85  Resp: (!) 25     Temp: 97.8 F (36.6 C)  98.9 F (37.2 C) 98.3 F (36.8 C)  TempSrc: Oral  Axillary Oral  SpO2: (!) 77% 94% (!) 83% (!) 84%  Weight:      Height:        General: Pt asleep, difficult to arouse, not in acute distress Cardiovascular: RRR, S1/S2 +, no rubs, no gallops Respiratory: CTA bilaterally, increased resp effort Abdominal: Soft, NT, ND, bowel sounds + Extremities: no edema, no cyanosis   The results of significant diagnostics from this hospitalization (including imaging, microbiology, ancillary and laboratory) are listed below for reference.     Microbiology: Recent Results (from the past 240 hour(s))  SARS Coronavirus 2 (CEPHEID - Performed in [place] hospital lab), Hosp Order     Status: None   Collection Time: 09/01/18  3:44 PM  Result Value Ref Range Status   SARS Coronavirus 2 NEGATIVE NEGATIVE Final    Comment: (NOTE) If result is NEGATIVE SARS-CoV-2 target nucleic acids are NOT DETECTED. The SARS-CoV-2 RNA is generally detectable in upper and lower  respiratory specimens during the acute phase of infection. The lowest  concentration of SARS-CoV-2 viral copies this assay can detect is 250  copies / mL. A negative result does not preclude SARS-CoV-2 infection  and  should not be used as the sole basis for treatment or other  patient management decisions.  A negative result may occur with  improper specimen collection / handling,  submission of specimen other  than nasopharyngeal swab, presence of viral mutation(s) within the  areas targeted by this assay, and inadequate number of viral copies  (<250 copies / mL). A negative result must be combined with clinical  observations, patient history, and epidemiological information. If result is POSITIVE SARS-CoV-2 target nucleic acids are DETECTED. The SARS-CoV-2 RNA is generally detectable in upper and lower  respiratory specimens dur ing the acute phase of infection.  Positive  results are indicative of active infection with SARS-CoV-2.  Clinical  correlation with patient history and other diagnostic information is  necessary to determine patient infection status.  Positive results do  not rule out bacterial infection or co-infection with other viruses. If result is PRESUMPTIVE POSTIVE SARS-CoV-2 nucleic acids MAY BE PRESENT.   A presumptive positive result was obtained on the submitted specimen  and confirmed on repeat testing.  While 2019 novel coronavirus  (SARS-CoV-2) nucleic acids may be present in the submitted sample  additional confirmatory testing may be necessary for epidemiological  and / or clinical management purposes  to differentiate between  SARS-CoV-2 and other Sarbecovirus currently known to infect humans.  If clinically indicated additional testing with an alternate test  methodology [id number]) is advised. The SARS-CoV-2 RNA is generally  detectable in upper and lower respiratory sp ecimens during the acute  phase of infection. The expected result is Negative. Fact Sheet for Patients:  StrictlyIdeas.no Fact Sheet for Healthcare Providers: BankingDealers.co.za This test is not yet approved or cleared by the [place] FDA and has been authorized for detection and/or diagnosis of SARS-CoV-2 by FDA under an Emergency Use Authorization (EUA).  This EUA will remain in effect (meaning this test can be  used) for the duration of the COVID-19 declaration under Section 564(b)(1) of the Act, 21 U.S.C. section 360bbb-3(b)(1), unless the authorization is terminated or revoked sooner. Performed at [facility], [place] [address] [place], [place] [address]   Surgical pcr screen     Status: Abnormal   Collection Time: 09/01/18  8:30 PM  Result Value Ref Range Status   MRSA, PCR POSITIVE (A) NEGATIVE Final    Comment: RESULT CALLED TO, READ BACK BY AND VERIFIED WITH: HANDY,C RN 09/01/2018 AT 2203 SKEEN,P    Staphylococcus aureus POSITIVE (A) NEGATIVE Final    Comment: (NOTE) The Xpert SA Assay (FDA approved for NASAL specimens in patients 20 years of age and older), is one component of a comprehensive surveillance program. It is not intended to diagnose infection nor to guide or monitor treatment. Performed at [facility], [place] [address] [place], [place] [address]      Labs: BNP (last 3 results) No results for input(s): BNP in the last 8760 hours. Basic Metabolic Panel: Recent Labs  Lab 09/01/18 1501 09/02/18 0234  NA 139 140  K 5.0 4.2  CL 97* 99  CO2 28 28  GLUCOSE 93 186*  BUN 40* 42*  CREATININE 1.45* 1.31*  CALCIUM 9.6 9.1   Liver Function Tests: Recent Labs  Lab 09/01/18 1501  AST 20  ALT 19  ALKPHOS 80  BILITOT 0.5  PROT 6.8  ALBUMIN 4.1   No results for input(s): LIPASE, AMYLASE in the last 168 hours. No results for input(s): AMMONIA in the last 168 hours. CBC: Recent Labs  Lab 09/01/18  1501 09/02/18 0234  WBC 2.9* 7.7  NEUTROABS 1.6*  --   HGB 13.2 12.5  HCT 40.1 37.3  MCV 102.0* 101.4*  PLT 140* 130*   Cardiac Enzymes: No results for input(s): CKTOTAL, CKMB, CKMBINDEX, TROPONINI in the last 168 hours. BNP: Invalid input(s): POCBNP CBG: No results for input(s): GLUCAP in the last 168 hours. D-Dimer No results for input(s): DDIMER in the last 72 hours. Hgb A1c No results for input(s): HGBA1C in the last 72 hours. Lipid  Profile No results for input(s): CHOL, HDL, LDLCALC, TRIG, CHOLHDL, LDLDIRECT in the last 72 hours. Thyroid function studies No results for input(s): TSH, T4TOTAL, T3FREE, THYROIDAB in the last 72 hours.  Invalid input(s): FREET3 Anemia work up No results for input(s): VITAMINB12, FOLATE, FERRITIN, TIBC, IRON, RETICCTPCT in the last 72 hours. Urinalysis    Component Value Date/Time   COLORURINE YELLOW 06/13/2017 2356   APPEARANCEUR CLEAR 06/13/2017 2356   LABSPEC 1.025 06/13/2017 2356   PHURINE 5.0 06/13/2017 2356   GLUCOSEU NEGATIVE 06/13/2017 2356   HGBUR NEGATIVE 06/13/2017 2356   BILIRUBINUR NEGATIVE 06/13/2017 2356   KETONESUR NEGATIVE 06/13/2017 2356   PROTEINUR NEGATIVE 06/13/2017 2356   UROBILINOGEN 0.2 06/28/2014 0456   NITRITE NEGATIVE 06/13/2017 2356   LEUKOCYTESUR NEGATIVE 06/13/2017 2356   Sepsis Labs Invalid input(s): PROCALCITONIN,  WBC,  LA[facility] Microbiology Recent Results (from the past 240 hour(s))  SARS Coronavirus 2 (CEPHEID - Performed in [place] hospital lab), Hosp Order     Status: None   Collection Time: 09/01/18  3:44 PM  Result Value Ref Range Status   SARS Coronavirus 2 NEGATIVE NEGATIVE Final    Comment: (NOTE) If result is NEGATIVE SARS-CoV-2 target nucleic acids are NOT DETECTED. The SARS-CoV-2 RNA is generally detectable in upper and lower  respiratory specimens during the acute phase of infection. The lowest  concentration of SARS-CoV-2 viral copies this assay can detect is 250  copies / mL. A negative result does not preclude SARS-CoV-2 infection  and should not be used as the sole basis for treatment or other  patient management decisions.  A negative result may occur with  improper specimen collection / handling, submission of specimen other  than nasopharyngeal swab, presence of viral mutation(s) within the  areas targeted by this assay, and inadequate number of viral copies  (<250 copies / mL). A negative result must be combined  with clinical  observations, patient history, and epidemiological information. If result is POSITIVE SARS-CoV-2 target nucleic acids are DETECTED. The SARS-CoV-2 RNA is generally detectable in upper and lower  respiratory specimens dur ing the acute phase of infection.  Positive  results are indicative of active infection with SARS-CoV-2.  Clinical  correlation with patient history and other diagnostic information is  necessary to determine patient infection status.  Positive results do  not rule out bacterial infection or co-infection with other viruses. If result is PRESUMPTIVE POSTIVE SARS-CoV-2 nucleic acids MAY BE PRESENT.   A presumptive positive result was obtained on the submitted specimen  and confirmed on repeat testing.  While 2019 novel coronavirus  (SARS-CoV-2) nucleic acids may be present in the submitted sample  additional confirmatory testing may be necessary for epidemiological  and / or clinical management purposes  to differentiate between  SARS-CoV-2 and other Sarbecovirus currently known to infect humans.  If clinically indicated additional testing with an alternate test  methodology [phone]) is advised. The SARS-CoV-2 RNA is generally  detectable in upper and lower respiratory sp ecimens during the  acute  phase of infection. The expected result is Negative. Fact Sheet for Patients:  StrictlyIdeas.no Fact Sheet for Healthcare Providers: BankingDealers.co.za This test is not yet approved or cleared by the [place] FDA and has been authorized for detection and/or diagnosis of SARS-CoV-2 by FDA under an Emergency Use Authorization (EUA).  This EUA will remain in effect (meaning this test can be used) for the duration of the COVID-19 declaration under Section 564(b)(1) of the Act, 21 U.S.C. section 360bbb-3(b)(1), unless the authorization is terminated or revoked sooner. Performed at [facility], [place] [address], [place], [place] [address]   Surgical pcr screen     Status: Abnormal   Collection Time: 09/01/18  8:30 PM  Result Value Ref Range Status   MRSA, PCR POSITIVE (A) NEGATIVE Final    Comment: RESULT CALLED TO, READ BACK BY AND VERIFIED WITH: HANDY,C RN 09/01/2018 AT 2203 SKEEN,P    Staphylococcus aureus POSITIVE (A) NEGATIVE Final    Comment: (NOTE) The Xpert SA Assay (FDA approved for NASAL specimens in patients 68 years of age and older), is one component of a comprehensive surveillance program. It is not intended to diagnose infection nor to guide or monitor treatment. Performed at [facility], [place] [address], [place], [place] [address]    Time spent: 30 min  SIGNED:   Marylu Lund, MD  [facility] 09/05/2018, 10:42 AM  If 7PM-7AM, please contact night-coverage

## 2018-09-05 NOTE — Social Work (Addendum)
10:09am- CSW received confirmation that pt has been approved for [facility]. Paged MD to let him know this update.   8:38am- Acknowledging family request for comfort and hospice home in [facility] or [place], referral placed for pt to [facility].   CSW continuing to follow for support with disposition.  Westley Hummer, MSW, [facility] [employer] [phone]

## 2018-09-05 NOTE — Progress Notes (Signed)
Manufacturing engineer [facility]) Hospital Liaison note.   Received request from Westley Hummer, [place] for family interest in [facility] with request for transfer today.  Chart reviewed and eligibility confirmed. Spoke with family to confirm interest and explain services. Family agreeable to transfer today. CSW aware.  Registration paper work completed. Dr. Orpah Melter (or whoever) to assume care per family request. Please fax discharge summary to [phone]. RN please call report to [phone]. Please arrange transport for patient.    Thank you,      Farrel Gordon, RN, CCM   [facility] (listed on [facility] under [facility] [place])  [phone]

## 2018-09-05 NOTE — Progress Notes (Signed)
Patient discharging to [facility]. Report called in to [facility]. Awaiting transportation.

## 2018-10-01 DEATH — deceased

## 2019-04-21 IMAGING — CT CT CERVICAL SPINE W/O CM
5 of 8 series · 13 of 33 positions shown, 14 images · non-contrast
Comparison: 05/06/2017

CLINICAL DATA: Patient fell from recliner.  Dementia baseline.

EXAM:
CT HEAD WITHOUT CONTRAST
CT CERVICAL SPINE WITHOUT CONTRAST
TECHNIQUE: Multidetector CT imaging of the head and cervical spine was
performed following the standard protocol without intravenous
contrast. Multiplanar CT image reconstructions of the cervical spine
were also generated.

[Series 4: head bone · axial · 0.44mm/px · z∈[-196,-108]mm · 3 of 88 slices shown]
[im 22/88  bone]
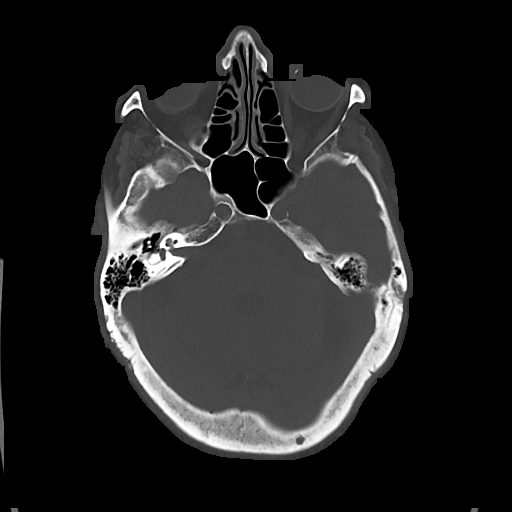
[im 44/88  bone]
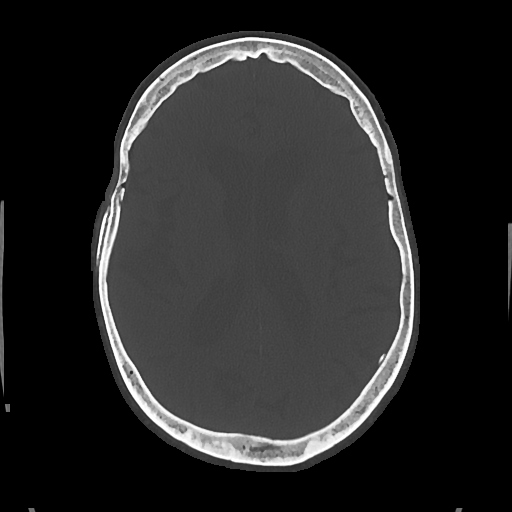
[im 66/88  bone]
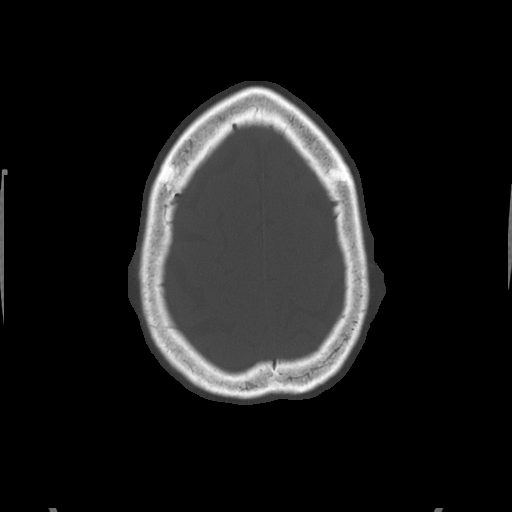

[Series 9: c spine soft · axial · 0.38mm/px · z∈[-306,-258]mm · 2 of 73 slices shown]
[im 25/73  soft-tissue]
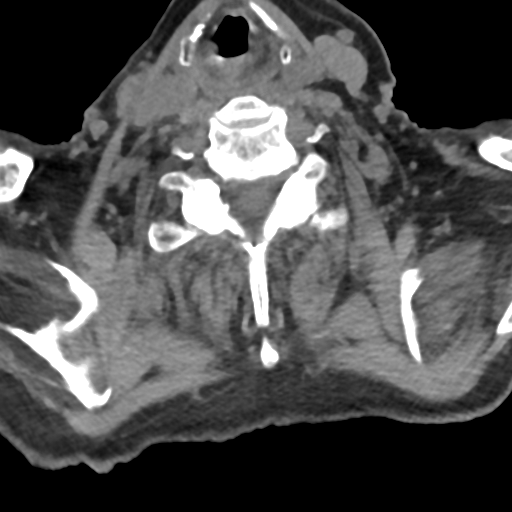
[im 49/73  soft-tissue]
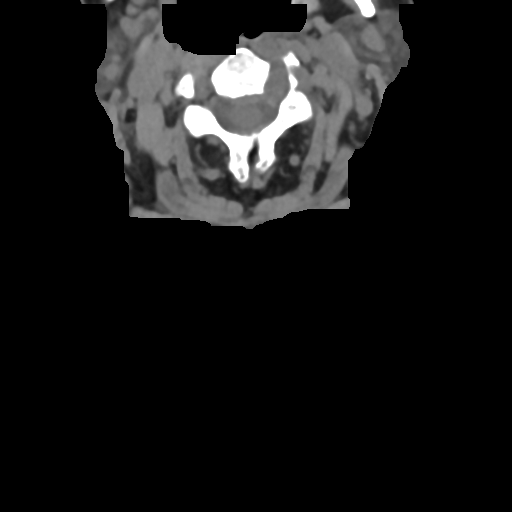

[Series 10: sag bone · sagittal · 0.27mm/px · 5 of 61 slices shown]
[im 11/61  bone]
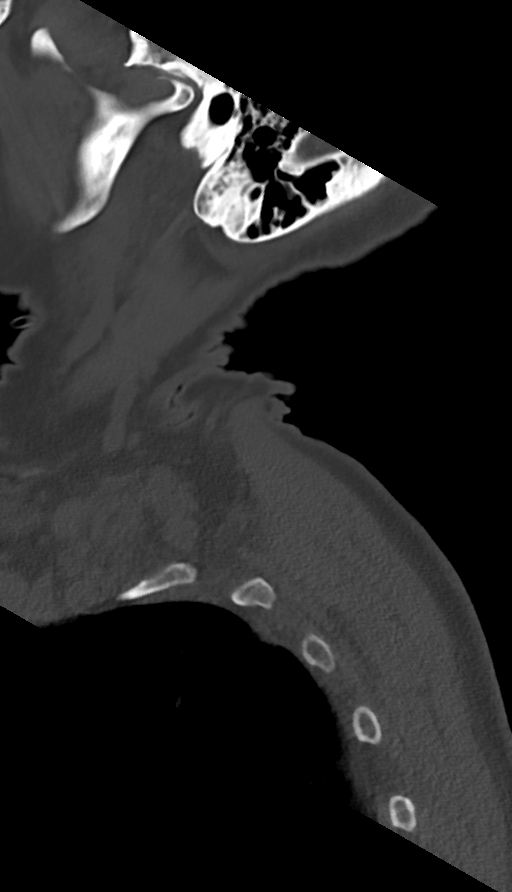
[im 21/61  bone]
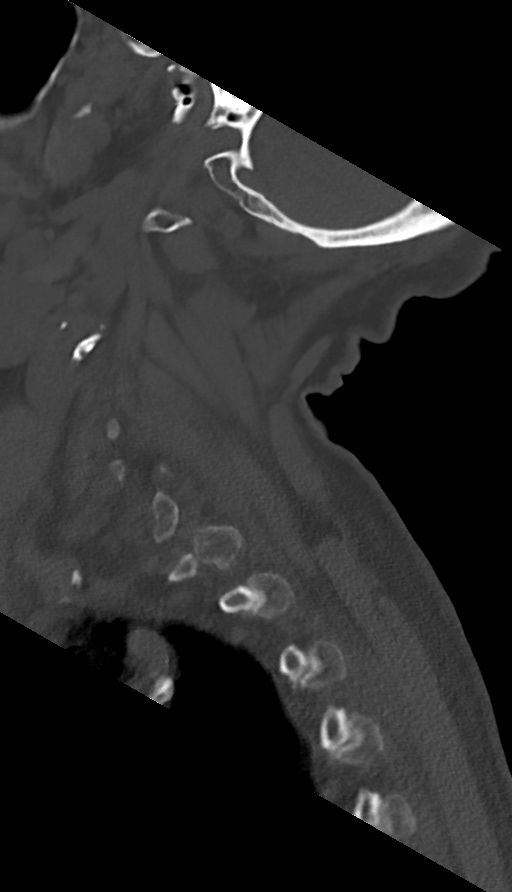
[im 31/61  bone]
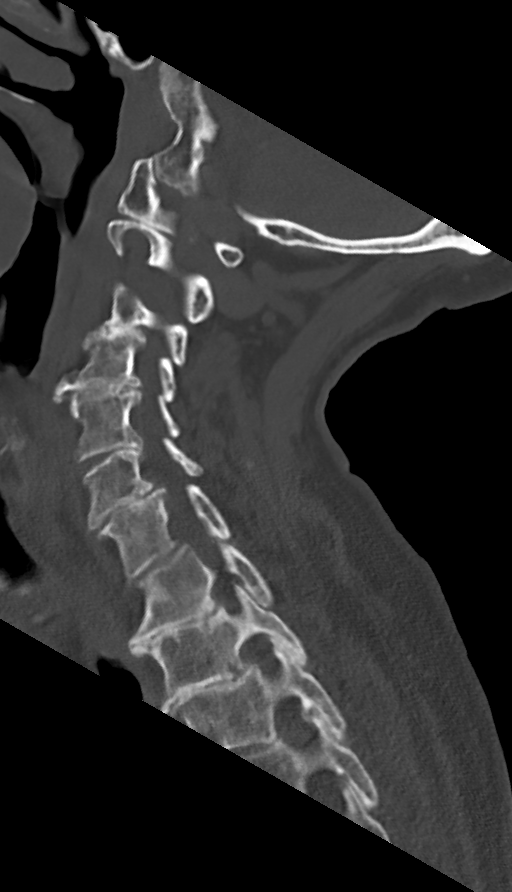
[im 41/61  bone]
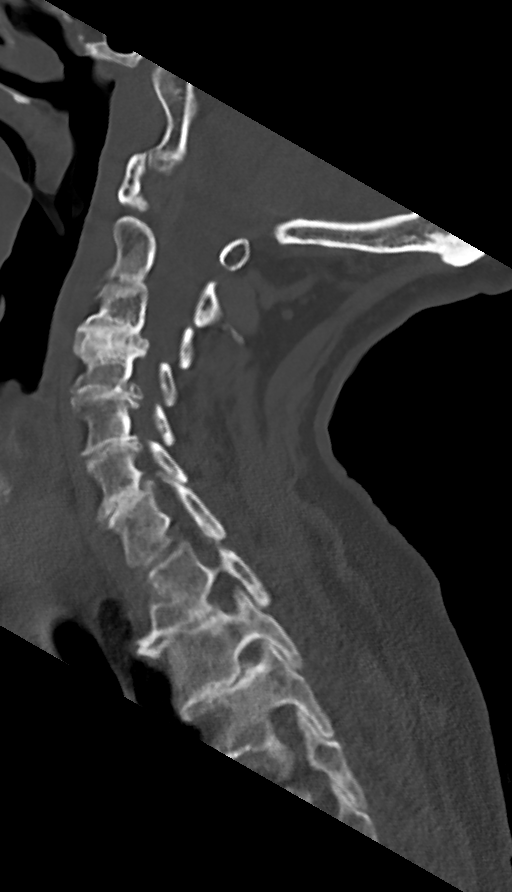
[im 51/61  bone]
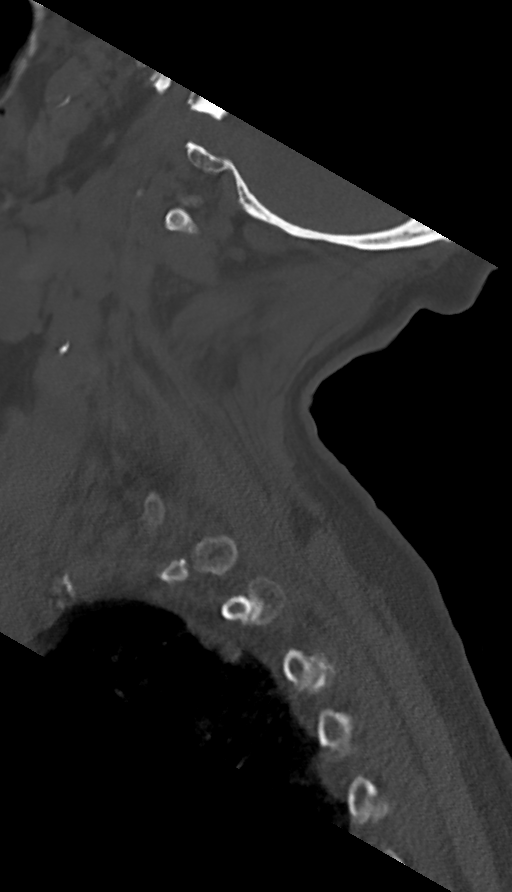

[Series 11: cor bone · coronal · 0.20mm/px · 1 of 61 slices shown]
[im 31/61  bone]
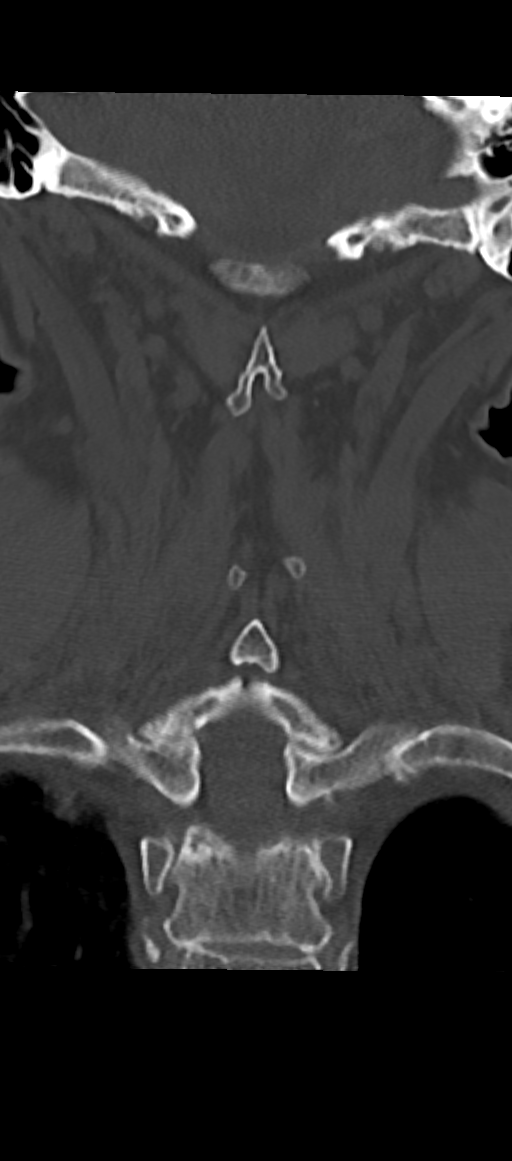

[Series 12: orthogonal axials · axial · 0.21mm/px · z∈[-316,-270]mm · 2 of 68 slices shown, 3 images]
[im 23/68  soft-tissue]
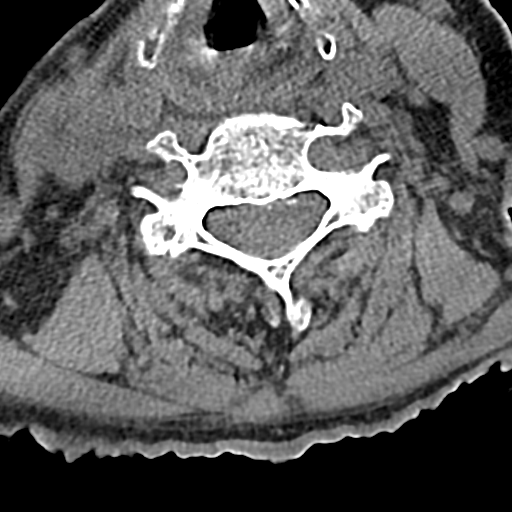
[im 23/68  bone]
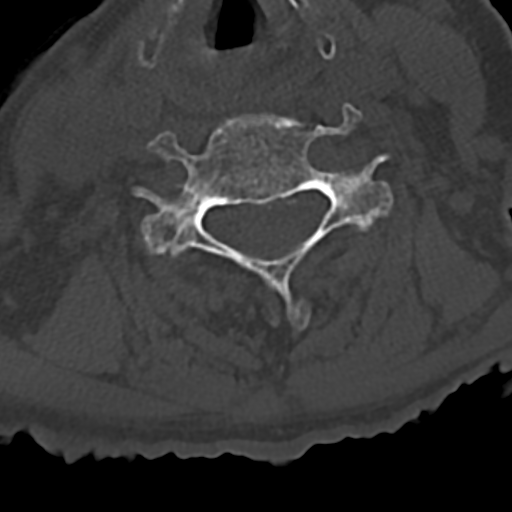
[im 45/68  bone]
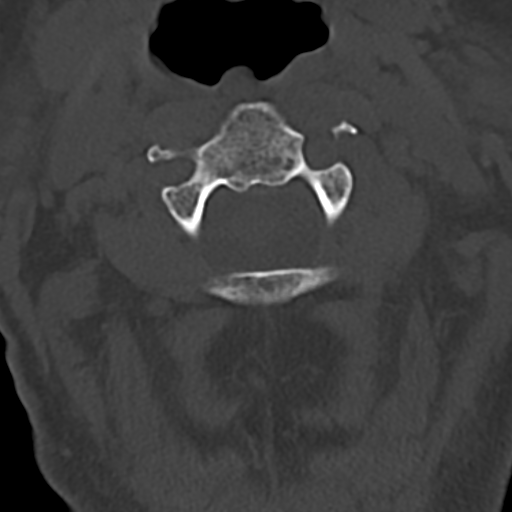

[13 of 33 positions shown; findings below may reference images not displayed]

FINDINGS: CT HEAD FINDINGS

Brain: Redemonstration of cerebral atrophy with sulcal and
ventricular prominence. Moderate chronic periventricular and
subcortical white matter microvascular ischemic change is noted
without large vascular territory infarct, intra-axial mass nor
extra-axial fluid collections. No intracranial hemorrhage.

Vascular: No hyperdense vessel or unexpected calcification.
Mild-to-moderate atherosclerosis of the carotid siphons.

Skull: No acute nor suspicious osseous abnormality.

Sinuses/Orbits: No acute finding.

Other: None

CT CERVICAL SPINE FINDINGS

Alignment: Maintained cervical lordosis. Intact craniocervical
relationship. Osteoarthritic joint space narrowing spurring about
the atlantodental.

Skull base and vertebrae: No acute fracture. No primary bone lesion
or focal pathologic process.

Soft tissues and spinal canal: No prevertebral fluid or swelling. No
visible canal hematoma.

Disc levels: Moderate to marked disc space narrowing along the
entirety of the cervical spine with small posterior marginal
osteophytes and uncinate spurring secondary to uncovertebral joint
osteoarthritis. These contribute to multilevel bilateral neural
foraminal encroachment. No jumped or perched facets. Mild grade 1
anterolisthesis of C6 on C7.

Upper chest: No pneumonic consolidations. Limited assessment due to
patient respiratory motion.

Other: Moderate atherosclerosis of the extracranial carotid arteries
bilaterally.
IMPRESSION: 1. No acute intracranial abnormality.
2. Atrophy with chronic microvascular ischemic disease.
3. Cervical spondylosis without acute posttraumatic cervical spine
fracture.
